# Patient Record
Sex: Female | Born: 1945 | Race: White | Hispanic: No | Marital: Married | Smoking: Former smoker
Health system: Southern US, Community
[De-identification: ages and names within clinical notes are randomized; demographics above are authoritative.]

## PROBLEM LIST (undated history)

## (undated) DIAGNOSIS — Z124 Encounter for screening for malignant neoplasm of cervix: Secondary | ICD-10-CM

## (undated) DIAGNOSIS — R195 Other fecal abnormalities: Secondary | ICD-10-CM

## (undated) DIAGNOSIS — G250 Essential tremor: Secondary | ICD-10-CM

## (undated) DIAGNOSIS — G252 Other specified forms of tremor: Secondary | ICD-10-CM

## (undated) DIAGNOSIS — R42 Dizziness and giddiness: Secondary | ICD-10-CM

## (undated) DIAGNOSIS — Z9889 Other specified postprocedural states: Secondary | ICD-10-CM

## (undated) DIAGNOSIS — M542 Cervicalgia: Secondary | ICD-10-CM

## (undated) DIAGNOSIS — H7191 Unspecified cholesteatoma, right ear: Secondary | ICD-10-CM

## (undated) DIAGNOSIS — Z86010 Personal history of colonic polyps: Secondary | ICD-10-CM

## (undated) DIAGNOSIS — K219 Gastro-esophageal reflux disease without esophagitis: Secondary | ICD-10-CM

## (undated) DIAGNOSIS — E785 Hyperlipidemia, unspecified: Secondary | ICD-10-CM

## (undated) DIAGNOSIS — I10 Essential (primary) hypertension: Secondary | ICD-10-CM

## (undated) DIAGNOSIS — G709 Myoneural disorder, unspecified: Secondary | ICD-10-CM

## (undated) DIAGNOSIS — S060X9A Concussion with loss of consciousness of unspecified duration, initial encounter: Secondary | ICD-10-CM

## (undated) DIAGNOSIS — K5790 Diverticulosis of intestine, part unspecified, without perforation or abscess without bleeding: Secondary | ICD-10-CM

## (undated) DIAGNOSIS — R109 Unspecified abdominal pain: Secondary | ICD-10-CM

## (undated) DIAGNOSIS — R079 Chest pain, unspecified: Secondary | ICD-10-CM

## (undated) DIAGNOSIS — Z87891 Personal history of nicotine dependence: Secondary | ICD-10-CM

## (undated) DIAGNOSIS — J309 Allergic rhinitis, unspecified: Secondary | ICD-10-CM

## (undated) DIAGNOSIS — T7840XA Allergy, unspecified, initial encounter: Secondary | ICD-10-CM

## (undated) DIAGNOSIS — H9191 Unspecified hearing loss, right ear: Secondary | ICD-10-CM

## (undated) DIAGNOSIS — R112 Nausea with vomiting, unspecified: Secondary | ICD-10-CM

## (undated) DIAGNOSIS — R259 Unspecified abnormal involuntary movements: Secondary | ICD-10-CM

## (undated) DIAGNOSIS — Z720 Tobacco use: Secondary | ICD-10-CM

## (undated) DIAGNOSIS — R739 Hyperglycemia, unspecified: Secondary | ICD-10-CM

## (undated) DIAGNOSIS — D125 Benign neoplasm of sigmoid colon: Secondary | ICD-10-CM

## (undated) DIAGNOSIS — Z8739 Personal history of other diseases of the musculoskeletal system and connective tissue: Secondary | ICD-10-CM

## (undated) DIAGNOSIS — K56699 Other intestinal obstruction unspecified as to partial versus complete obstruction: Secondary | ICD-10-CM

## (undated) DIAGNOSIS — E669 Obesity, unspecified: Secondary | ICD-10-CM

## (undated) DIAGNOSIS — Z0000 Encounter for general adult medical examination without abnormal findings: Secondary | ICD-10-CM

## (undated) HISTORY — DX: Dizziness and giddiness: R42

## (undated) HISTORY — DX: Allergy, unspecified, initial encounter: T78.40XA

## (undated) HISTORY — DX: Chest pain, unspecified: R07.9

## (undated) HISTORY — DX: Other intestinal obstruction unspecified as to partial versus complete obstruction: K56.699

## (undated) HISTORY — DX: Cervicalgia: M54.2

## (undated) HISTORY — DX: Essential (primary) hypertension: I10

## (undated) HISTORY — DX: Allergic rhinitis, unspecified: J30.9

## (undated) HISTORY — DX: Encounter for general adult medical examination without abnormal findings: Z00.00

## (undated) HISTORY — DX: Hyperglycemia, unspecified: R73.9

## (undated) HISTORY — DX: Unspecified abnormal involuntary movements: R25.9

## (undated) HISTORY — DX: Essential tremor: G25.0

## (undated) HISTORY — DX: Encounter for screening for malignant neoplasm of cervix: Z12.4

## (undated) HISTORY — PX: OTHER SURGICAL HISTORY: SHX169

## (undated) HISTORY — DX: Personal history of nicotine dependence: Z87.891

## (undated) HISTORY — DX: Unspecified abdominal pain: R10.9

## (undated) HISTORY — DX: Hyperlipidemia, unspecified: E78.5

## (undated) HISTORY — DX: Mixed hyperlipidemia: E78.2

## (undated) HISTORY — DX: Other fecal abnormalities: R19.5

## (undated) HISTORY — DX: Tobacco use: Z72.0

## (undated) HISTORY — DX: Obesity, unspecified: E66.9

## (undated) HISTORY — DX: Diverticulosis of intestine, part unspecified, without perforation or abscess without bleeding: K57.90

## (undated) HISTORY — DX: Unspecified cholesteatoma, right ear: H71.91

## (undated) HISTORY — DX: Benign neoplasm of sigmoid colon: D12.5

## (undated) HISTORY — DX: Other specified forms of tremor: G25.2

## (undated) HISTORY — DX: Gastro-esophageal reflux disease without esophagitis: K21.9

## (undated) HISTORY — DX: Unspecified hearing loss, right ear: H91.91

## (undated) HISTORY — DX: Personal history of colonic polyps: Z86.010

## (undated) HISTORY — DX: Personal history of other diseases of the musculoskeletal system and connective tissue: Z87.39

---

## 1967-08-24 DIAGNOSIS — S060X9A Concussion with loss of consciousness of unspecified duration, initial encounter: Secondary | ICD-10-CM | POA: Insufficient documentation

## 1967-08-24 DIAGNOSIS — S060XAA Concussion with loss of consciousness status unknown, initial encounter: Secondary | ICD-10-CM

## 1967-08-24 HISTORY — DX: Concussion with loss of consciousness status unknown, initial encounter: S06.0XAA

## 1967-08-24 HISTORY — DX: Concussion with loss of consciousness of unspecified duration, initial encounter: S06.0X9A

## 2003-10-23 ENCOUNTER — Other Ambulatory Visit: Admission: RE | Admit: 2003-10-23 | Discharge: 2003-10-23 | Payer: Self-pay | Admitting: Obstetrics and Gynecology

## 2004-06-24 ENCOUNTER — Ambulatory Visit: Payer: Self-pay | Admitting: Internal Medicine

## 2005-05-13 ENCOUNTER — Ambulatory Visit: Payer: Self-pay | Admitting: Internal Medicine

## 2005-10-06 ENCOUNTER — Other Ambulatory Visit: Admission: RE | Admit: 2005-10-06 | Discharge: 2005-10-06 | Payer: Self-pay | Admitting: Obstetrics and Gynecology

## 2007-08-29 ENCOUNTER — Other Ambulatory Visit: Admission: RE | Admit: 2007-08-29 | Discharge: 2007-08-29 | Payer: Self-pay | Admitting: Obstetrics and Gynecology

## 2008-04-23 ENCOUNTER — Ambulatory Visit: Payer: Self-pay | Admitting: Obstetrics and Gynecology

## 2008-09-19 ENCOUNTER — Ambulatory Visit: Payer: Self-pay | Admitting: Diagnostic Radiology

## 2008-09-19 ENCOUNTER — Ambulatory Visit: Payer: Self-pay | Admitting: Internal Medicine

## 2008-09-19 ENCOUNTER — Ambulatory Visit (HOSPITAL_BASED_OUTPATIENT_CLINIC_OR_DEPARTMENT_OTHER): Admission: RE | Admit: 2008-09-19 | Discharge: 2008-09-19 | Payer: Self-pay | Admitting: Internal Medicine

## 2008-09-19 DIAGNOSIS — E785 Hyperlipidemia, unspecified: Secondary | ICD-10-CM | POA: Insufficient documentation

## 2008-09-19 DIAGNOSIS — I10 Essential (primary) hypertension: Secondary | ICD-10-CM

## 2008-09-19 DIAGNOSIS — Z87891 Personal history of nicotine dependence: Secondary | ICD-10-CM

## 2008-09-19 DIAGNOSIS — H9191 Unspecified hearing loss, right ear: Secondary | ICD-10-CM | POA: Insufficient documentation

## 2008-09-19 DIAGNOSIS — J309 Allergic rhinitis, unspecified: Secondary | ICD-10-CM

## 2008-09-19 DIAGNOSIS — G250 Essential tremor: Secondary | ICD-10-CM

## 2008-09-19 DIAGNOSIS — H7191 Unspecified cholesteatoma, right ear: Secondary | ICD-10-CM

## 2008-09-19 DIAGNOSIS — R079 Chest pain, unspecified: Secondary | ICD-10-CM | POA: Insufficient documentation

## 2008-09-19 DIAGNOSIS — R0789 Other chest pain: Secondary | ICD-10-CM

## 2008-09-19 HISTORY — DX: Other chest pain: R07.89

## 2008-09-19 HISTORY — DX: Essential (primary) hypertension: I10

## 2008-09-19 HISTORY — DX: Allergic rhinitis, unspecified: J30.9

## 2008-09-19 HISTORY — DX: Unspecified cholesteatoma, right ear: H71.91

## 2008-09-19 HISTORY — DX: Personal history of nicotine dependence: Z87.891

## 2008-09-19 HISTORY — DX: Chest pain, unspecified: R07.9

## 2008-09-19 HISTORY — DX: Essential tremor: G25.0

## 2008-09-19 HISTORY — DX: Unspecified hearing loss, right ear: H91.91

## 2008-09-26 ENCOUNTER — Ambulatory Visit: Payer: Self-pay | Admitting: Internal Medicine

## 2008-09-26 LAB — CONVERTED CEMR LAB
Albumin: 3.7 g/dL (ref 3.5–5.2)
BUN: 17 mg/dL (ref 6–23)
Basophils Absolute: 0.2 10*3/uL — ABNORMAL HIGH (ref 0.0–0.1)
Basophils Relative: 3.5 % — ABNORMAL HIGH (ref 0.0–3.0)
CRP, High Sensitivity: 1 (ref 0.00–5.00)
Calcium: 9.2 mg/dL (ref 8.4–10.5)
Creatinine, Ser: 0.6 mg/dL (ref 0.4–1.2)
Direct LDL: 148.6 mg/dL
Eosinophils Absolute: 0.1 10*3/uL (ref 0.0–0.7)
Eosinophils Relative: 2.2 % (ref 0.0–5.0)
GFR calc Af Amer: 130 mL/min
GFR calc non Af Amer: 108 mL/min
HCT: 41.6 % (ref 36.0–46.0)
MCHC: 34.3 g/dL (ref 30.0–36.0)
MCV: 102.6 fL — ABNORMAL HIGH (ref 78.0–100.0)
Monocytes Absolute: 0.3 10*3/uL (ref 0.1–1.0)
Platelets: 259 10*3/uL (ref 150–400)
RBC: 4.06 M/uL (ref 3.87–5.11)
TSH: 1.22 microintl units/mL (ref 0.35–5.50)
Triglycerides: 54 mg/dL (ref 0–149)
WBC: 6.2 10*3/uL (ref 4.5–10.5)

## 2008-10-02 ENCOUNTER — Ambulatory Visit: Payer: Self-pay | Admitting: Cardiology

## 2008-10-03 ENCOUNTER — Ambulatory Visit: Payer: Self-pay | Admitting: Internal Medicine

## 2008-10-03 DIAGNOSIS — E782 Mixed hyperlipidemia: Secondary | ICD-10-CM

## 2008-10-03 HISTORY — DX: Mixed hyperlipidemia: E78.2

## 2008-10-08 ENCOUNTER — Ambulatory Visit: Payer: Self-pay

## 2008-10-08 ENCOUNTER — Encounter: Payer: Self-pay | Admitting: Internal Medicine

## 2009-01-01 ENCOUNTER — Ambulatory Visit: Payer: Self-pay | Admitting: Internal Medicine

## 2009-01-01 DIAGNOSIS — R259 Unspecified abnormal involuntary movements: Secondary | ICD-10-CM | POA: Insufficient documentation

## 2009-01-01 DIAGNOSIS — H6690 Otitis media, unspecified, unspecified ear: Secondary | ICD-10-CM | POA: Insufficient documentation

## 2009-01-01 DIAGNOSIS — J069 Acute upper respiratory infection, unspecified: Secondary | ICD-10-CM | POA: Insufficient documentation

## 2009-01-01 HISTORY — DX: Unspecified abnormal involuntary movements: R25.9

## 2009-01-03 ENCOUNTER — Telehealth (INDEPENDENT_AMBULATORY_CARE_PROVIDER_SITE_OTHER): Payer: Self-pay | Admitting: *Deleted

## 2009-01-16 ENCOUNTER — Encounter (INDEPENDENT_AMBULATORY_CARE_PROVIDER_SITE_OTHER): Payer: Self-pay | Admitting: *Deleted

## 2009-02-13 ENCOUNTER — Encounter: Payer: Self-pay | Admitting: Internal Medicine

## 2010-09-20 LAB — CONVERTED CEMR LAB: Pap Smear: NORMAL

## 2011-01-05 NOTE — Assessment & Plan Note (Signed)
[facility]                            CARDIOLOGY OFFICE NOTE   Renee [name], Renee [name]                       MRN:          [id number]  DATE:10/02/2008                            DOB:          August 21, 1946    Renee [name] is a pleasant 65 year old female, who I am asked to evaluate  for chest pain and hypertension.  She has no prior cardiac history.  She  did have a Myoview due to chest pain in October 2001 that showed no  ischemia or infarction, and ejection fraction was 65%.  She typically  has dyspnea with more extreme activities, but not with routine activity.  There is no orthopnea, PND, pedal edema, palpitations, presyncope, or  syncope.  Over the past 3 weeks, she has had occasional chest pain.  The  pain is in the substernal location.  It does not radiate.  It is  described as a pressure.  She has noticed them more with taking a deep  breath and also with bending over.  She also notices them more when  using her arms.  There is no associated nausea, vomiting, shortness of  breath, or diaphoresis.  It lasts for minutes and resolves  spontaneously.  Note, she does not have exertional chest pain.  Because  of the above, we are asked to further evaluate.  Also, of note, her  blood pressure has recently been elevated.  She was given Benicar, but  apparently this caused an elevated heart rate.  She has discontinued  this.  She is to see Dr. Artist Pais tomorrow for further management of this  issue.   PRESENT MEDICATIONS:  1. Aspirin 325 mg p.o. daily.  2. Multivitamin daily.  3. Vitamin B complex, A, D, and E.   ALLERGIES:  She has an allergy to AMOXICILLIN.   SOCIAL HISTORY:  She does smoke.  She consumes 2 alcoholic beverages per  day.  She is married with 2 children and 2 grandchildren.   FAMILY HISTORY:  Positive for coronary artery disease, as her father  died of a myocardial infarction at age 71.   PAST MEDICAL HISTORY:  Significant for recently  diagnosed hypertension.  She denies any hyperlipidemia or diabetes.  She also has been told that  she has had COPD recently.  She has a history of a benign essential  tremor.  She also has a history of allergic rhinitis.   REVIEW OF SYSTEMS:  She denies any headaches, fevers, or chills.  There  is no productive cough or hemoptysis.  There is no dysphagia,  odynophagia, melena, or hematochezia.  There is no dysuria or hematuria.  There is no rash or seizure activity.  There is no orthopnea, PND, or  pedal edema.  The remaining systems are negative.   PHYSICAL EXAMINATION:  VITAL SIGNS:  Today, shows a blood pressure of  138/90 and her pulse is 88.  She weighs 166 pounds.  GENERAL:  She is a well developed, well nourished in no acute distress.  SKIN:  Warm and dry.  She does not appear to be depressed.  There is  no  peripheral clubbing.  BACK:  Normal.  HEENT:  Normal with normal eyelids.  NECK:  Supple with normal upstroke bilaterally.  No bruits heard.  There  is no jugular venous distention and I cannot appreciate thyromegaly.  CHEST:  Clear to auscultation.  No expansion.  CARDIOVASCULAR:  Regular rhythm.  Normal S1 and S2.  There are no  murmurs, rubs, or gallops noted.  ABDOMEN:  Nontender and nondistended.  Positive bowel sounds.  No  hepatosplenomegaly.  No mass appreciated.  There is no abdominal bruit.  She has 2+ femoral pulses bilaterally.  No Bruits.  EXTREMITIES:  No edema.  I cannot palpate no cords.  She has 2+ dorsalis  pedis pulses bilaterally.  NEUROLOGIC:  Grossly intact.   I do have an electrocardiogram dated on September 19, 2008.  This shows  sinus rhythm with minor nonspecific ST changes.   DIAGNOSES:  1. Atypical chest pain - the patient's symptoms are atypical.      However, she does have some dyspnea on exertion and has multiple      risk factors.  We will plan to proceed with a stress Myoview.  If      shows no ischemia, then we will not pursue further  cardiac workup.      2.  Tobacco abuse - we discussed the importance of discontinuing      this for between 3-10 minutes.  2. Hypertension - her blood pressure is mildly elevated today.  She is      to see Dr. Artist Pais tomorrow and an additional medication might be added      at that time.  Note, she did not tolerate the BENICAR.  We      discussed lifestyle modification including low-sodium diet, weight      loss, exercise, moderate ethyl alcohol intake, and discontinuing      her tobacco use.  I will leave this issue to Dr. Artist Pais.  3. Benign essential tremor.   We will see Renee [name] back on as-needed basis pending the results of her  Myoview.     Madolyn Frieze Jens Som, MD, [facility]  Electronically Signed    BSC/MedQ  DD: 10/02/2008  DT: 10/03/2008  Job #: [id number]   cc:   Barbette Hair. Artist Pais, DO

## 2011-01-22 HISTORY — PX: MOUTH SURGERY: SHX715

## 2011-11-04 ENCOUNTER — Ambulatory Visit: Payer: Self-pay | Admitting: Internal Medicine

## 2011-11-23 ENCOUNTER — Encounter: Payer: Self-pay | Admitting: Internal Medicine

## 2011-11-23 ENCOUNTER — Ambulatory Visit (INDEPENDENT_AMBULATORY_CARE_PROVIDER_SITE_OTHER): Payer: Medicare Other | Admitting: Internal Medicine

## 2011-11-23 ENCOUNTER — Telehealth: Payer: Self-pay | Admitting: Internal Medicine

## 2011-11-23 VITALS — BP 132/80 | HR 99 | Temp 98.1°F | Resp 18 | Ht 66.0 in | Wt 165.0 lb

## 2011-11-23 DIAGNOSIS — J40 Bronchitis, not specified as acute or chronic: Secondary | ICD-10-CM

## 2011-11-23 MED ORDER — DOXYCYCLINE HYCLATE 100 MG PO TABS: 100.0000 mg | ORAL_TABLET | Freq: Two times a day (BID) | ORAL | Status: AC

## 2011-11-23 MED ORDER — DOXYCYCLINE HYCLATE 100 MG PO TABS: 100.0000 mg | ORAL_TABLET | Freq: Two times a day (BID) | ORAL | Status: DC

## 2011-11-23 NOTE — Telephone Encounter (Signed)
Patient states that she gave Agustin Cree the wrong pharmacy info.  New pharmacy info  [facility] on [address]

## 2011-11-23 NOTE — Progress Notes (Signed)
  Subjective:    Patient ID: Renee [name], female    DOB: 12-22-1945, 66 y.o.   MRN: [id number]  HPI Pt presents to clinic for evaluation of cough. Notes ~12 h/o intermittently productive cough previously productive for green sputum. Has associated nasal congestion and drainage. Denies f/c or hemoptysis. No alleviating or exacerbating factors. Does smoke tobacco however has weaned down to ~2 cigarettes daily with cessation plans. No taking medication for cessation. No other complaints.  Past Medical History  Diagnosis Date  . Hyperlipidemia   . Tobacco abuse   . Tremor, essential   . Hypertension   . Allergic rhinitis    Past Surgical History  Procedure Date  . No past surgeries 11/23/2011    denies surgical history    reports that she has been smoking.  She has never used smokeless tobacco. She reports that she drinks alcohol. She reports that she does not use illicit drugs. family history includes Heart attack (age of onset:58) in her father; Hypertension in her mother; and Stroke in her mother.  There is no history of Colon cancer, and Breast cancer, and Prostate cancer, and Diabetes, . Allergies  Allergen Reactions  . Amoxicillin     REACTION: TROUBLE BREATHING     Review of Systems  Constitutional: Negative for fever and chills.  HENT: Positive for congestion and rhinorrhea.   Respiratory: Positive for cough. Negative for shortness of breath.   Cardiovascular: Negative for chest pain.  All other systems reviewed and are negative.       Objective:   Physical Exam  Nursing note and vitals reviewed. Constitutional: She appears well-developed and well-nourished. No distress.  HENT:  Head: Normocephalic and atraumatic.  Right Ear: External ear normal.  Left Ear: External ear normal.  Nose: Nose normal.  Mouth/Throat: Oropharynx is clear and moist. No oropharyngeal exudate.  Eyes: Conjunctivae are normal. No scleral icterus.  Neck: Neck supple.  Cardiovascular: Normal  rate, regular rhythm and normal heart sounds.  Exam reveals no gallop and no friction rub.   No murmur heard. Pulmonary/Chest: Effort normal and breath sounds normal. No respiratory distress. She has no wheezes. She has no rales.  Lymphadenopathy:    She has no cervical adenopathy.  Neurological: She is alert.       Head tremor noted  Skin: Skin is warm and dry. She is not diaphoretic.  Psychiatric: She has a normal mood and affect.          Assessment & Plan:

## 2011-11-23 NOTE — Assessment & Plan Note (Signed)
Begin abx course. Followup if no improvement or worsening. 

## 2011-11-23 NOTE — Telephone Encounter (Signed)
Pharmacy updated, Rx resent to [facility] on [address].

## 2011-12-09 ENCOUNTER — Ambulatory Visit: Payer: Self-pay | Admitting: Internal Medicine

## 2012-02-23 ENCOUNTER — Encounter: Payer: Self-pay | Admitting: Family

## 2012-02-23 ENCOUNTER — Ambulatory Visit (INDEPENDENT_AMBULATORY_CARE_PROVIDER_SITE_OTHER): Payer: Medicare Other | Admitting: Family

## 2012-02-23 VITALS — BP 130/92 | HR 96 | Temp 98.2°F | Resp 16 | Wt 167.0 lb

## 2012-02-23 DIAGNOSIS — H6690 Otitis media, unspecified, unspecified ear: Secondary | ICD-10-CM

## 2012-02-23 DIAGNOSIS — J029 Acute pharyngitis, unspecified: Secondary | ICD-10-CM

## 2012-02-23 LAB — POCT RAPID STREP A (OFFICE): Rapid Strep A Screen: NEGATIVE

## 2012-02-23 MED ORDER — AZITHROMYCIN 250 MG PO TABS: ORAL_TABLET | ORAL | Status: AC

## 2012-02-23 NOTE — Patient Instructions (Addendum)
Otitis Media, Adult  A middle ear infection is an infection in the space behind the eardrum. The medical name for this is "otitis media." It may happen after a common cold. It is caused by a germ that starts growing in that space. You may feel swollen glands in your neck on the side of the ear infection.  HOME CARE INSTRUCTIONS   · Take your medicine as directed until it is gone, even if you feel better after the first few days.  · Only take over-the-counter or prescription medicines for pain, discomfort, or fever as directed by your caregiver.  · Occasional use of a nasal decongestant a couple times per day may help with discomfort and help the eustachian tube to drain better.  Follow up with your caregiver in 10 to 14 days or as directed, to be certain that the infection has cleared. Not keeping the appointment could result in a chronic or permanent injury, pain, hearing loss and disability. If there is any problem keeping the appointment, you must call back to this facility for assistance.  SEEK IMMEDIATE MEDICAL CARE IF:   · You are not getting better in 2 to 3 days.  · You have pain that is not controlled with medication.  · You feel worse instead of better.  · You cannot use the medication as directed.  · You develop swelling, redness or pain around the ear or stiffness in your neck.  MAKE SURE YOU:   · Understand these instructions.  · Will watch your condition.  · Will get help right away if you are not doing well or get worse.  Document Released: 05/14/2004 Document Revised: 07/29/2011 Document Reviewed: 03/15/2008  Exit[employer]® Patient Information ©2012 [employer].

## 2012-02-23 NOTE — Progress Notes (Signed)
  Subjective:    Patient ID: Renee [name], female    DOB: 08/06/1946, 66 y.o.   MRN: [id number]  HPI  [name] is a 66 yr old female who presents today with chief complaint of right ear pain.  She reports that ear pain started 4-5 days ago. She has tried aspirin, heating pad, and echinacea with some improvement.  She reports some right sided throat pain.  She denies associated fever.     Review of Systems See HPI  Past Medical History  Diagnosis Date  . Hyperlipidemia   . Tobacco abuse   . Tremor, essential   . Hypertension   . Allergic rhinitis     History   Social History  . Marital Status: Married    Spouse Name: N/A    Number of Children: N/A  . Years of Education: N/A   Occupational History  . Not on file.   Social History Main Topics  . Smoking status: Current Everyday Smoker    Types: Cigarettes  . Smokeless tobacco: Never Used   Comment: 5 cigarettes daily  . Alcohol Use: Yes  . Drug Use: No  . Sexually Active: Not on file   Other Topics Concern  . Not on file   Social History Narrative  . No narrative on file    Past Surgical History  Procedure Date  . Mouth surgery 01/22/11    lower denture secured by titanium bolts    Family History  Problem Relation Age of Onset  . Colon cancer Neg Hx   . Breast cancer Neg Hx   . Prostate cancer Neg Hx   . Heart attack Father 64    deceased  . Stroke Mother   . Hypertension Mother   . Diabetes Neg Hx     Allergies  Allergen Reactions  . Amoxicillin     REACTION: TROUBLE BREATHING    No current outpatient prescriptions on file prior to visit.    BP 130/92  Pulse 96  Temp 98.2 F (36.8 C) (Oral)  Resp 16  Wt 167 lb (75.751 kg)  SpO2 97%       Objective:   Physical Exam  Constitutional: She appears well-developed and well-nourished. No distress.  HENT:  Head: Normocephalic and atraumatic.  Right Ear: Tympanic membrane is retracted. Tympanic membrane is not perforated and not  erythematous.  Left Ear: Tympanic membrane is erythematous. Tympanic membrane is not retracted and not bulging.  Mouth/Throat: Posterior oropharyngeal erythema present. No oropharyngeal exudate, posterior oropharyngeal edema or tonsillar abscesses.  Cardiovascular: Normal rate and regular rhythm.   Pulmonary/Chest: Effort normal and breath sounds normal. No respiratory distress. She has no wheezes. She has no rales. She exhibits no tenderness.  Abdominal: Soft. Bowel sounds are normal. She exhibits no distension and no mass. There is no tenderness. There is no rebound.  Lymphadenopathy:    She has no cervical adenopathy.  Neurological:       Resting head tremor.   Psychiatric: She has a normal mood and affect. Her speech is normal and behavior is normal. Judgment and thought content normal. Cognition and memory are normal.          Assessment & Plan:

## 2012-02-23 NOTE — Assessment & Plan Note (Signed)
Will plan to treat with azithromycin due to hx of sob with amoxicillin. Rapid strep is performed today and is negative.  Recommended motrin prn pain.

## 2012-02-25 NOTE — Addendum Note (Signed)
Addended by: Mervin Kung A on: 02/25/2012 01:39 PM   Modules accepted: Orders

## 2013-02-19 ENCOUNTER — Telehealth: Payer: Self-pay | Admitting: Internal Medicine

## 2013-02-19 ENCOUNTER — Encounter: Payer: Self-pay | Admitting: Family Medicine

## 2013-02-19 ENCOUNTER — Ambulatory Visit (INDEPENDENT_AMBULATORY_CARE_PROVIDER_SITE_OTHER): Payer: Medicare Other | Admitting: Family Medicine

## 2013-02-19 VITALS — BP 126/88 | HR 86 | Temp 98.7°F | Ht 66.75 in | Wt 167.0 lb

## 2013-02-19 DIAGNOSIS — I10 Essential (primary) hypertension: Secondary | ICD-10-CM

## 2013-02-19 DIAGNOSIS — F17200 Nicotine dependence, unspecified, uncomplicated: Secondary | ICD-10-CM

## 2013-02-19 DIAGNOSIS — J309 Allergic rhinitis, unspecified: Secondary | ICD-10-CM

## 2013-02-19 DIAGNOSIS — E785 Hyperlipidemia, unspecified: Secondary | ICD-10-CM

## 2013-02-19 DIAGNOSIS — H9191 Unspecified hearing loss, right ear: Secondary | ICD-10-CM

## 2013-02-19 DIAGNOSIS — H60399 Other infective otitis externa, unspecified ear: Secondary | ICD-10-CM

## 2013-02-19 DIAGNOSIS — H60391 Other infective otitis externa, right ear: Secondary | ICD-10-CM

## 2013-02-19 DIAGNOSIS — H9190 Unspecified hearing loss, unspecified ear: Secondary | ICD-10-CM

## 2013-02-19 MED ORDER — HYDROCORTISONE-ACETIC ACID 1-2 % OT SOLN: 4.0000 [drp] | Freq: Three times a day (TID) | OTIC | Status: DC

## 2013-02-19 NOTE — Patient Instructions (Addendum)
Next visit annual GYN with labs prior to visit, lipid, renal, cbc, tsh, hepatic  Otitis Externa Otitis externa is a bacterial or fungal infection of the outer ear canal. This is the area from the eardrum to the outside of the ear. Otitis externa is sometimes called "swimmer's ear." CAUSES  Possible causes of infection include:  Swimming in dirty water.  Moisture remaining in the ear after swimming or bathing.  Mild injury (trauma) to the ear.  Objects stuck in the ear (foreign body).  Cuts or scrapes (abrasions) on the outside of the ear. SYMPTOMS  The first symptom of infection is often itching in the ear canal. Later signs and symptoms may include swelling and redness of the ear canal, ear pain, and yellowish-white fluid (pus) coming from the ear. The ear pain may be worse when pulling on the earlobe. DIAGNOSIS  Your caregiver will perform a physical exam. A sample of fluid may be taken from the ear and examined for bacteria or fungi. TREATMENT  Antibiotic ear drops are often given for 10 to 14 days. Treatment may also include pain medicine or corticosteroids to reduce itching and swelling. PREVENTION   Keep your ear dry. Use the corner of a towel to absorb water out of the ear canal after swimming or bathing.  Avoid scratching or putting objects inside your ear. This can damage the ear canal or remove the protective wax that lines the canal. This makes it easier for bacteria and fungi to grow.  Avoid swimming in lakes, polluted water, or poorly chlorinated pools.  You may use ear drops made of rubbing alcohol and vinegar after swimming. Combine equal parts of white vinegar and alcohol in a bottle. Put 3 or 4 drops into each ear after swimming. HOME CARE INSTRUCTIONS   Apply antibiotic ear drops to the ear canal as prescribed by your caregiver.  Only take over-the-counter or prescription medicines for pain, discomfort, or fever as directed by your caregiver.  If you have  diabetes, follow any additional treatment instructions from your caregiver.  Keep all follow-up appointments as directed by your caregiver. SEEK MEDICAL CARE IF:   You have a fever.  Your ear is still red, swollen, painful, or draining pus after 3 days.  Your redness, swelling, or pain gets worse.  You have a severe headache.  You have redness, swelling, pain, or tenderness in the area behind your ear. MAKE SURE YOU:   Understand these instructions.  Will watch your condition.  Will get help right away if you are not doing well or get worse. Document Released: 08/09/2005 Document Revised: 11/01/2011 Document Reviewed: 08/26/2011 [facility] Patient Information 2014 [place], [place].

## 2013-02-19 NOTE — Telephone Encounter (Signed)
Next visit annual GYN with labs prior to visit, lipid, renal, cbc, tsh, hepatic   Patient has appointment in September/2014 and will be going to [place] [facility]

## 2013-02-20 ENCOUNTER — Encounter: Payer: Self-pay | Admitting: Family Medicine

## 2013-02-20 NOTE — Assessment & Plan Note (Signed)
Encouraged complete cessation. 

## 2013-02-20 NOTE — Assessment & Plan Note (Signed)
Persistent/recurrent Otitis externa and/or SOM. Started on Vosol HC drops and referred to ENT for further consideration

## 2013-02-20 NOTE — Assessment & Plan Note (Signed)
Consider Zyrtec daily

## 2013-02-20 NOTE — Assessment & Plan Note (Signed)
Well controlled, no change in medications

## 2013-02-20 NOTE — Progress Notes (Signed)
Patient ID: Renee [name], female   DOB: 14-Sep-1945, 67 y.o.   MRN: [id number] Renee [name] [id number] 05-06-1946 02/20/2013      Progress Note-Follow Up  Subjective  Chief Complaint  Chief Complaint  Patient presents with  . Follow-up    HPI  Patient is a 67 year old Caucasian female who is in today to discuss ear pain and pressure. So long history of chronic head tremor but this is not worsening. She is complaining of symptoms worsen her right ear than her left ear. She complains of some popping and decreased hearing in that right ear fairly consistently at this point. No significant headache. No fevers or chills. Minimal nasal congestion. No chest pain, palpitations, shortness of breath, GI or GU complaints are noted.   Past Medical History  Diagnosis Date  . Hyperlipidemia   . Tobacco abuse   . Tremor, essential   . Hypertension   . Allergic rhinitis   . 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 Mouth surgery  01/22/11    lower denture secured by titanium bolts    Family History  Problem Relation Age of Onset  . Colon cancer Neg Hx   . Breast cancer Neg Hx   . Prostate cancer Neg Hx   . Heart attack Father 2    deceased  . Stroke Mother   . Hypertension Mother   . Diabetes Neg Hx     History   Social History  . Marital Status: Married    Spouse Name: N/A    Number of Children: N/A  . Years of Education: N/A   Occupational History  . Not on file.   Social History Main Topics  . Smoking status: Current Every Day Smoker    Types: Cigarettes  . Smokeless tobacco: Never Used     Comment: 5 cigarettes daily  . Alcohol Use: Yes  . Drug Use: No  . Sexually Active: Not on file   Other Topics Concern  . Not on file   Social History Narrative  . No narrative on file    No current outpatient prescriptions on file prior to visit.   No current  facility-administered medications on file prior to visit.    Allergies  Allergen Reactions  . Amoxicillin     REACTION: TROUBLE BREATHING    Review of Systems  Review of Systems  Constitutional: Negative for fever, chills and malaise/fatigue.  HENT: Positive for ear pain. Negative for hearing loss, nosebleeds, congestion and ear discharge.   Eyes: Negative for discharge.  Respiratory: Negative for cough, sputum production, shortness of breath and wheezing.   Cardiovascular: Negative for chest pain, palpitations and leg swelling.  Gastrointestinal: Negative for heartburn, nausea, vomiting, abdominal pain, diarrhea, constipation and blood in stool.  Genitourinary: Negative for dysuria, urgency, frequency and hematuria.  Musculoskeletal: Negative for myalgias, back pain and falls.  Skin: Negative for rash.  Neurological: Negative for dizziness, tremors, sensory change, focal weakness, loss of consciousness, weakness and headaches.  Endo/Heme/Allergies: Negative for polydipsia. Does not bruise/bleed easily.  Psychiatric/Behavioral: Negative for depression and suicidal ideas. The patient is not nervous/anxious and does not have insomnia.     Objective  BP 126/88  Pulse 86  Temp(Src) 98.7 F (37.1 C) (Oral)  Ht 5' 6.75" (1.695 m)  Wt 167 lb (75.751 kg)  BMI 26.37 kg/m2  SpO2 97%  Physical Exam  Physical Exam  Constitutional: She is oriented to person, place, and time and well-developed, well-nourished, and in no distress. No distress.  HENT:  Head: Normocephalic and atraumatic.  Left Ear: External ear normal.  TM dull and retracted b/l  Eyes: Conjunctivae are normal.  Neck: Neck supple. No thyromegaly present.  Cardiovascular: Normal rate, regular rhythm and normal heart sounds.   No murmur heard. Pulmonary/Chest: Effort normal and breath sounds normal. She has no wheezes.  Abdominal: She exhibits no distension and no mass.  Musculoskeletal: She exhibits no edema.   Lymphadenopathy:    She has no cervical adenopathy.  Neurological: She is alert and oriented to person, place, and time.  Skin: Skin is warm and dry. No rash noted. She is not diaphoretic.  Psychiatric: Memory, affect and judgment normal.   Lab Results  Component Value Date   TSH 1.22 09/26/2008   Lab Results  Component Value Date   WBC 6.2 09/26/2008   HGB 14.3 09/26/2008   HCT 41.6 09/26/2008   MCV 102.6* 09/26/2008   PLT 259 09/26/2008   Lab Results  Component Value Date   CREATININE 0.6 09/26/2008   BUN 17 09/26/2008   NA 142 09/26/2008   K 5.1 09/26/2008   CL 110 09/26/2008   CO2 0* 09/26/2008   Lab Results  Component Value Date   ALT 22 09/26/2008   AST 20 09/26/2008   ALKPHOS 80 09/26/2008   BILITOT 0.6 09/26/2008   Lab Results  Component Value Date   CHOL 234* 09/26/2008   Lab Results  Component Value Date   HDL 73.0 09/26/2008   No results found for this basename: [facility]   Lab Results  Component Value Date   TRIG 54 09/26/2008   Lab Results  Component Value Date   CHOLHDL 3.2 CALC 09/26/2008     Assessment & Plan  TOBACCO ABUSE Encouraged complete cessation  Hearing loss in right ear Persistent/recurrent Otitis externa and/or SOM. Started on Vosol HC drops and referred to ENT for further consideration  HYPERTENSION Well controlled, no change in medications  ALLERGIC RHINITIS Consider Zyrtec daily

## 2013-02-28 ENCOUNTER — Other Ambulatory Visit: Payer: Self-pay | Admitting: Physician Assistant

## 2013-02-28 DIAGNOSIS — H7190 Unspecified cholesteatoma, unspecified ear: Secondary | ICD-10-CM

## 2013-02-28 DIAGNOSIS — H6980 Other specified disorders of Eustachian tube, unspecified ear: Secondary | ICD-10-CM

## 2013-03-05 ENCOUNTER — Ambulatory Visit
Admission: RE | Admit: 2013-03-05 | Discharge: 2013-03-05 | Disposition: A | Discharge status: Home / Self Care | Payer: Medicare Other | Source: Ambulatory Visit | Attending: Physician Assistant | Admitting: Physician Assistant

## 2013-03-05 DIAGNOSIS — H6980 Other specified disorders of Eustachian tube, unspecified ear: Secondary | ICD-10-CM

## 2013-03-05 DIAGNOSIS — H7190 Unspecified cholesteatoma, unspecified ear: Secondary | ICD-10-CM

## 2013-05-01 ENCOUNTER — Telehealth: Payer: Self-pay | Admitting: Internal Medicine

## 2013-05-01 LAB — LIPID PANEL: Total CHOL/HDL Ratio: 3.7 Ratio

## 2013-05-01 LAB — HEPATIC FUNCTION PANEL
Alkaline Phosphatase: 81 U/L (ref 39–117)
Indirect Bilirubin: 0.7 mg/dL (ref 0.0–0.9)
Total Protein: 6.8 g/dL (ref 6.0–8.3)

## 2013-05-01 LAB — RENAL FUNCTION PANEL
CO2: 25 mEq/L (ref 19–32)
Calcium: 9.5 mg/dL (ref 8.4–10.5)
Chloride: 102 mEq/L (ref 96–112)
Glucose, Bld: 106 mg/dL — ABNORMAL HIGH (ref 70–99)
Potassium: 4.6 mEq/L (ref 3.5–5.3)
Sodium: 136 mEq/L (ref 135–145)

## 2013-05-01 LAB — CBC
Hemoglobin: 15.8 g/dL — ABNORMAL HIGH (ref 12.0–15.0)
MCH: 34.4 pg — ABNORMAL HIGH (ref 26.0–34.0)
RBC: 4.59 MIL/uL (ref 3.87–5.11)

## 2013-05-01 NOTE — Telephone Encounter (Signed)
We sent the patient to [facility]NT for loss of hearing.  She saw Dr Jearld Fenton at [facility].  He diagnosed her with cholesteatena.  Said she is to have a CT to look at this further and then probably surgery.  She has been trying to reach someone to get the CT set up for 3 weeks.  She cannot get to a human and cannot get a call back.  Any suggestions what she should do.

## 2013-05-01 NOTE — Telephone Encounter (Signed)
Please call ENT and ask MD's nurse what they want [place] to do, wil they order the test they want at [facility] or no? Patient's head tremor precluded the study they wanted at [facility]

## 2013-05-01 NOTE — Telephone Encounter (Signed)
Please advise 

## 2013-05-01 NOTE — Telephone Encounter (Signed)
So I reviewed chart it looks like they already ordered the CT at [facility] imaging, call them and see what if anything they need to schedule the CT that has been ordered. I do not think anything else has to happen except for patient to call imaging and arrange a time since they already have an order.

## 2013-05-01 NOTE — Telephone Encounter (Signed)
Pt states [facility] tried to do the CT already and had to stop due to head tremors. Pt states [facility] told patient she would need to go to the hospital to be sedated or have her head "belted" down? Please advise?

## 2013-05-02 ENCOUNTER — Other Ambulatory Visit (HOSPITAL_COMMUNITY): Payer: Self-pay | Admitting: Otolaryngology

## 2013-05-02 DIAGNOSIS — H7191 Unspecified cholesteatoma, right ear: Secondary | ICD-10-CM

## 2013-05-02 LAB — TSH: TSH: 1.554 u[IU]/mL (ref 0.350–4.500)

## 2013-05-02 NOTE — Telephone Encounter (Signed)
FYI:   The nurse called back and stated that Dr Jearld Fenton has ordered a sedated CT scan but pt doesn't want this until November.  The nurse stated pt was going to be away the whole month of October. The nurse also stated patient has turned down a couple of dates previously.  No appt has been scheduled yet but nurse is going to work on a date for November

## 2013-05-02 NOTE — Telephone Encounter (Signed)
Left a detailed message on MD's nurses vm to return my call

## 2013-05-08 ENCOUNTER — Other Ambulatory Visit (HOSPITAL_COMMUNITY)
Admission: RE | Admit: 2013-05-08 | Discharge: 2013-05-08 | Disposition: A | Discharge status: Home / Self Care | Payer: Medicare Other | Source: Ambulatory Visit | Attending: Family Medicine | Admitting: Family Medicine

## 2013-05-08 ENCOUNTER — Other Ambulatory Visit: Payer: Self-pay | Admitting: Family Medicine

## 2013-05-08 ENCOUNTER — Encounter: Payer: Self-pay | Admitting: Family Medicine

## 2013-05-08 ENCOUNTER — Ambulatory Visit (INDEPENDENT_AMBULATORY_CARE_PROVIDER_SITE_OTHER): Payer: Medicare Other | Admitting: Family Medicine

## 2013-05-08 VITALS — BP 138/82 | HR 84 | Temp 98.7°F | Ht 66.0 in | Wt 166.1 lb

## 2013-05-08 DIAGNOSIS — R739 Hyperglycemia, unspecified: Secondary | ICD-10-CM

## 2013-05-08 DIAGNOSIS — Z0000 Encounter for general adult medical examination without abnormal findings: Secondary | ICD-10-CM

## 2013-05-08 DIAGNOSIS — H7191 Unspecified cholesteatoma, right ear: Secondary | ICD-10-CM

## 2013-05-08 DIAGNOSIS — D582 Other hemoglobinopathies: Secondary | ICD-10-CM

## 2013-05-08 DIAGNOSIS — R7309 Other abnormal glucose: Secondary | ICD-10-CM

## 2013-05-08 DIAGNOSIS — Z124 Encounter for screening for malignant neoplasm of cervix: Secondary | ICD-10-CM | POA: Insufficient documentation

## 2013-05-08 DIAGNOSIS — R32 Unspecified urinary incontinence: Secondary | ICD-10-CM

## 2013-05-08 DIAGNOSIS — F17200 Nicotine dependence, unspecified, uncomplicated: Secondary | ICD-10-CM

## 2013-05-08 DIAGNOSIS — I10 Essential (primary) hypertension: Secondary | ICD-10-CM

## 2013-05-08 DIAGNOSIS — H7190 Unspecified cholesteatoma, unspecified ear: Secondary | ICD-10-CM

## 2013-05-08 DIAGNOSIS — E785 Hyperlipidemia, unspecified: Secondary | ICD-10-CM

## 2013-05-08 HISTORY — DX: Encounter for screening for malignant neoplasm of cervix: Z12.4

## 2013-05-08 LAB — CBC
HCT: 47.9 % — ABNORMAL HIGH (ref 36.0–46.0)
Hemoglobin: 16.1 g/dL — ABNORMAL HIGH (ref 12.0–15.0)
MCH: 34.1 pg — ABNORMAL HIGH (ref 26.0–34.0)
MCHC: 33.6 g/dL (ref 30.0–36.0)

## 2013-05-08 LAB — HEMOGLOBIN A1C: Hgb A1c MFr Bld: 5.6 % (ref <5.7)

## 2013-05-08 MED ORDER — ATORVASTATIN CALCIUM 10 MG PO TABS: 10.0000 mg | ORAL_TABLET | Freq: Every day | ORAL | Status: DC

## 2013-05-08 NOTE — Progress Notes (Signed)
Patient ID: Renee [name], female   DOB: 1946-01-02, 67 y.o.   MRN: [id number] JERICHO CIESLIK [id number] [birth year] 05/08/2013      Progress Note New Patient  Subjective  Chief Complaint  Chief Complaint  Patient presents with  . Annual Exam    physical  . Gynecologic Exam    pap    HPI  Patient is 67 year old Caucasian female in today for routine medical care. She is following closely with ENT for cholesteatoma and is going to proceed with surgery. She continues to struggle with some intermittent ear pain and hearing loss. Unfortunately she continues to smoke 5 cigarettes daily. No recent illness. No fevers or chills. Chest pain, palpitations, shortness of breath, GI or GU concerns. Is doing well in her home. No falls or depression. Her ADLs are being performed without difficulty.   Physical Exam  Constitutional: She is oriented to person, place, and time and well-developed, well-nourished, and in no distress. No distress.  HENT:  Head: Normocephalic and atraumatic.  Right Ear: External ear normal.  Left Ear: External ear normal.  Nose: Nose normal.  Mouth/Throat: Oropharynx is clear and moist. No oropharyngeal exudate.  right TM mildly erythematous, dull, retracted. External canal unremarkable. Left TM dull, canal unremarkable  Eyes: Conjunctivae are normal. Pupils are equal, round, and reactive to light. Right eye exhibits no discharge. Left eye exhibits no discharge. No scleral icterus.  Neck: Normal range of motion. Neck supple. No thyromegaly present.  Cardiovascular: Normal rate, regular rhythm, normal heart sounds and intact distal pulses.   No murmur heard. Pulmonary/Chest: Effort normal and breath sounds normal. No respiratory distress. She has no wheezes. She has no rales.  Abdominal: Soft. Bowel sounds are normal. She exhibits no distension and no mass. There is no tenderness.  Genitourinary: Vagina normal, uterus normal, cervix normal, right adnexa normal and left  adnexa normal. No vaginal discharge found.  Breast exam without lesion, mass, skin change or discharge b/l  Musculoskeletal: Normal range of motion. She exhibits no edema and no tenderness.  Lymphadenopathy:    She has no cervical adenopathy.  Neurological: She is alert and oriented to person, place, and time. She has normal reflexes. No cranial nerve deficit. Coordination normal.  Skin: Skin is warm and dry. No rash noted. She is not diaphoretic.  Psychiatric: Mood, memory and affect normal.    Past Medical History  Diagnosis Date  . Hyperlipidemia   . Tobacco abuse   . Tremor, essential   . Hypertension   . Allergic rhinitis   . 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 Mouth surgery  01/22/11    lower denture secured by titanium bolts    Family History  Problem Relation Age of Onset  . Colon cancer Neg Hx   . Breast cancer Neg Hx   . Prostate cancer Neg Hx   . Diabetes Neg Hx   . Heart attack Father 62    deceased  . Stroke Mother   . Hypertension Mother   . Alcohol abuse Mother   . Obesity Daughter   . Heart disease Paternal Grandmother     AAA rupture  . Heart disease Paternal Grandfather     MI    History   Social History  . Marital Status: Married    Spouse Name: N/A    Number of Children: N/A  .  Years of Education: N/A   Occupational History  . Not on file.   Social History Main Topics  . Smoking status: Current Every Day Smoker    Types: Cigarettes  . Smokeless tobacco: Never Used     Comment: 5 cigarettes daily  . Alcohol Use: Yes  . Drug Use: No  . Sexual Activity: Not on file   Other Topics Concern  . Not on file   Social History Narrative  . No narrative on file    No current outpatient prescriptions on file prior to visit.   No current facility-administered medications on file prior to visit.    Allergies  Allergen Reactions  .  Amoxicillin     REACTION: TROUBLE BREATHING    Review of Systems  Review of Systems  Constitutional: Negative for fever, chills and malaise/fatigue.  HENT: Positive for hearing loss, ear pain and ear discharge. Negative for nosebleeds and congestion.   Eyes: Negative for discharge.  Respiratory: Negative for cough, sputum production, shortness of breath and wheezing.   Cardiovascular: Negative for chest pain, palpitations and leg swelling.  Gastrointestinal: Negative for heartburn, nausea, vomiting, abdominal pain, diarrhea, constipation and blood in stool.  Genitourinary: Negative for dysuria, urgency, frequency and hematuria.  Musculoskeletal: Negative for myalgias, back pain and falls.  Skin: Negative for rash.  Neurological: Negative for dizziness, tremors, sensory change, focal weakness, loss of consciousness, weakness and headaches.  Endo/Heme/Allergies: Negative for polydipsia. Does not bruise/bleed easily.  Psychiatric/Behavioral: Negative for depression and suicidal ideas. The patient is not nervous/anxious and does not have insomnia.     Objective  BP 138/82  Pulse 84  Temp(Src) 98.7 F (37.1 C) (Oral)  Ht 5\' 6"  (1.676 m)  Wt 166 lb 1.3 oz (75.333 kg)  BMI 26.82 kg/m2  Physical Exam  See above     Assessment & Plan  Cervical cancer screening Pap today, no concerns on exam.   HYPERTENSION Well controlled, no changes  Cholesteatoma of right ear Is following with Dr Jearld Fenton, is preparing for surgery  Preventative health care Encouraged [facility] but patient declined despite risk. Declines colonoscopy. No advanced directives, doing well with ADLs, no falls or depression.  TOBACCO ABUSE Encouraged complete cessation once again  HYPERLIPIDEMIA Tolerating Atorvasatin, avoid trans fats, add krill oil caps, increase exercise  rob

## 2013-05-08 NOTE — Patient Instructions (Addendum)
Start MegaRed krill oil caps daily Avoid trans fats/partially hydrogenated oils Start Co Q 10 enzyme when starting the Atorvastatin  kegel exercises sets of 10 twice a day  Cholesterol Cholesterol is a white, waxy, fat-like protein needed by your body in small amounts. The liver makes all the cholesterol you need. It is carried from the liver by the blood through the blood vessels. Deposits (plaque) may build up on blood vessel walls. This makes the arteries narrower and stiffer. Plaque increases the risk for heart attack and stroke. You cannot feel your cholesterol level even if it is very high. The only way to know is by a blood test to check your lipid (fats) levels. Once you know your cholesterol levels, you should keep a record of the test results. Work with your caregiver to to keep your levels in the desired range. WHAT THE RESULTS MEAN:  Total cholesterol is a rough measure of all the cholesterol in your blood.  LDL is the so-called bad cholesterol. This is the type that deposits cholesterol in the walls of the arteries. You want this level to be low.  HDL is the good cholesterol because it cleans the arteries and carries the LDL away. You want this level to be high.  Triglycerides are fat that the body can either burn for energy or store. High levels are closely linked to heart disease. DESIRED LEVELS:  Total cholesterol below 200.  LDL below 100 for people at risk, below 70 for very high risk.  HDL above 50 is good, above 60 is best.  Triglycerides below 150. HOW TO LOWER YOUR CHOLESTEROL:  Diet.  Choose fish or white meat chicken and [place], roasted or baked. Limit fatty cuts of red meat, fried foods, and processed meats, such as sausage and lunch meat.  Eat lots of fresh fruits and vegetables. Choose whole grains, beans, pasta, potatoes and cereals.  Use only small amounts of olive, corn or canola oils. Avoid butter, mayonnaise, shortening or palm kernel oils. Avoid  foods with trans-fats.  Use skim/nonfat milk and low-fat/nonfat yogurt and cheeses. Avoid whole milk, cream, ice cream, egg yolks and cheeses. Healthy desserts include angel food cake, ginger snaps, animal crackers, hard candy, popsicles, and low-fat/nonfat frozen yogurt. Avoid pastries, cakes, pies and cookies.  Exercise.  A regular program helps decrease LDL and raises HDL.  Helps with weight control.  Do things that increase your activity level like gardening, walking, or taking the stairs.  Medication.  May be prescribed by your caregiver to help lowering cholesterol and the risk for heart disease.  You may need medicine even if your levels are normal if you have several risk factors. HOME CARE INSTRUCTIONS   Follow your diet and exercise programs as suggested by your caregiver.  Take medications as directed.  Have blood work done when your caregiver feels it is necessary. MAKE SURE YOU:   Understand these instructions.  Will watch your condition.  Will get help right away if you are not doing well or get worse. Document Released: 05/04/2001 Document Revised: 11/01/2011 Document Reviewed: 10/25/2007 [facility] Patient Information 2014 [place], [place].

## 2013-05-08 NOTE — Assessment & Plan Note (Addendum)
Pap today, no concerns on exam.  

## 2013-05-09 LAB — URINALYSIS
Glucose, UA: NEGATIVE mg/dL
Leukocytes, UA: NEGATIVE
Specific Gravity, Urine: 1.008 (ref 1.005–1.030)
pH: 6 (ref 5.0–8.0)

## 2013-05-09 NOTE — Progress Notes (Signed)
Quick Note:  Patients spouse Informed and voiced understanding.   lab order placed ______

## 2013-05-09 NOTE — Progress Notes (Signed)
Quick Note:  Patients spouse Informed and voiced understanding ______ 

## 2013-05-12 ENCOUNTER — Encounter: Payer: Self-pay | Admitting: Family Medicine

## 2013-05-12 DIAGNOSIS — Z0000 Encounter for general adult medical examination without abnormal findings: Secondary | ICD-10-CM

## 2013-05-12 HISTORY — DX: Encounter for general adult medical examination without abnormal findings: Z00.00

## 2013-05-12 NOTE — Assessment & Plan Note (Signed)
Encouraged complete cessation once again 

## 2013-05-12 NOTE — Assessment & Plan Note (Addendum)
Encouraged MGM but patient declined despite risk. Declines colonoscopy. No advanced directives, doing well with ADLs, no falls or depression.

## 2013-05-12 NOTE — Assessment & Plan Note (Signed)
Is following with Dr Jearld Fenton, is preparing for surgery

## 2013-05-12 NOTE — Assessment & Plan Note (Signed)
Well controlled, no changes 

## 2013-05-12 NOTE — Assessment & Plan Note (Signed)
Tolerating Atorvasatin, avoid trans fats, add krill oil caps, increase exercise

## 2013-06-29 ENCOUNTER — Encounter (HOSPITAL_COMMUNITY): Payer: Self-pay

## 2013-06-29 ENCOUNTER — Ambulatory Visit (HOSPITAL_COMMUNITY)
Admission: RE | Admit: 2013-06-29 | Discharge: 2013-06-29 | Disposition: A | Discharge status: Home / Self Care | Payer: Medicare Other | Source: Ambulatory Visit | Attending: Otolaryngology | Admitting: Otolaryngology

## 2013-06-29 VITALS — BP 131/60 | HR 88 | Resp 16

## 2013-06-29 DIAGNOSIS — M799 Soft tissue disorder, unspecified: Secondary | ICD-10-CM | POA: Insufficient documentation

## 2013-06-29 DIAGNOSIS — Z79899 Other long term (current) drug therapy: Secondary | ICD-10-CM | POA: Insufficient documentation

## 2013-06-29 DIAGNOSIS — I10 Essential (primary) hypertension: Secondary | ICD-10-CM | POA: Insufficient documentation

## 2013-06-29 DIAGNOSIS — H7191 Unspecified cholesteatoma, right ear: Secondary | ICD-10-CM

## 2013-06-29 DIAGNOSIS — H9190 Unspecified hearing loss, unspecified ear: Secondary | ICD-10-CM | POA: Insufficient documentation

## 2013-06-29 DIAGNOSIS — H7190 Unspecified cholesteatoma, unspecified ear: Secondary | ICD-10-CM | POA: Insufficient documentation

## 2013-06-29 DIAGNOSIS — R259 Unspecified abnormal involuntary movements: Secondary | ICD-10-CM | POA: Insufficient documentation

## 2013-06-29 DIAGNOSIS — F17200 Nicotine dependence, unspecified, uncomplicated: Secondary | ICD-10-CM | POA: Insufficient documentation

## 2013-06-29 DIAGNOSIS — E785 Hyperlipidemia, unspecified: Secondary | ICD-10-CM | POA: Insufficient documentation

## 2013-06-29 MED ORDER — FENTANYL CITRATE 0.05 MG/ML IJ SOLN
25.0000 ug | INTRAMUSCULAR | Status: DC | PRN
  Filled 2013-06-29: qty 4

## 2013-06-29 MED ORDER — MIDAZOLAM HCL 2 MG/2ML IJ SOLN
INTRAMUSCULAR | Status: AC
  Administered 2013-06-29: 1 mg via INTRAVENOUS
  Filled 2013-06-29: qty 2

## 2013-06-29 MED ORDER — FENTANYL CITRATE 0.05 MG/ML IJ SOLN
INTRAMUSCULAR | Status: AC
  Filled 2013-06-29: qty 2

## 2013-06-29 MED ORDER — MIDAZOLAM HCL 2 MG/2ML IJ SOLN
1.0000 mg | INTRAMUSCULAR | Status: DC | PRN
  Administered 2013-06-29 (×2): 1 mg via INTRAVENOUS
  Administered 2013-06-29: 2 mg via INTRAVENOUS
  Filled 2013-06-29: qty 10

## 2013-06-29 NOTE — Progress Notes (Signed)
Pt. Brought to [facility] awake and alert @1135 . VSS.  Pt. Had soda to drink per her request. IV d/c'ed. Pt. Had no c/o. Discharged to [employer] entrance via w/c accompanied by RN and her husband. Instructed pt. Not to drive, operate any machinery or sign any important papers today. She verbalized understanding.

## 2013-06-29 NOTE — H&P (Signed)
Chief Complaint: "I am here for a CT with sedation." HPI: Renee [name] is an 67 y.o. female with history of an essential head tremor and right ear cholesteatoma, she is here today for a CT of her temporal bones without contrast with conscious sedation. She denies any history of sleep apnea or any known allergies to versed or fentanyl. She denies any active shortness of breath or chest pain. She has been NPO this morning and has a driver present today.   Past Medical History:  Past Medical History  Diagnosis Date  . Hyperlipidemia   . Tobacco abuse   . Tremor, essential   . Hypertension   . Allergic rhinitis   . Hearing loss in right ear 09/19/2008    Qualifier: Diagnosis of  By: Nena Jordan   . 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    Qualifier: Diagnosis of  By: Nena Jordan   . Preventative health care 05/12/2013    Past Surgical History:  Past Surgical History  Procedure Laterality Date  . Mouth surgery  01/22/11    lower denture secured by titanium bolts    Family History:  Family History  Problem Relation Age of Onset  . Colon cancer Neg Hx   . Breast cancer Neg Hx   . Prostate cancer Neg Hx   . Diabetes Neg Hx   . Heart attack Father 38    deceased  . Stroke Mother   . Hypertension Mother   . Alcohol abuse Mother   . Obesity Daughter   . Heart disease Paternal Grandmother     AAA rupture  . Heart disease Paternal Grandfather     MI    Social History:  reports that she has been smoking Cigarettes.  She has been smoking about 0.50 packs per day. She has never used smokeless tobacco. She reports that she drinks about 1.2 ounces of alcohol per week. She reports that she does not use illicit drugs.  Allergies:  Allergies  Allergen Reactions  . Amoxicillin     REACTION: TROUBLE BREATHING    Medications:   Medication List    ASK your doctor about these medications       atorvastatin 10 MG tablet  Commonly known as:  LIPITOR  Take 1 tablet (10 mg  total) by mouth daily.     b complex vitamins tablet  Take 1 tablet by mouth every other day.     multivitamin tablet  Take 1 tablet by mouth daily.       Please HPI for pertinent positives, otherwise complete 10 system ROS negative.  Physical Exam: BP 156/70  Pulse 99  Resp 16  SpO2 98% There is no weight on file to calculate BMI.  General Appearance:  Alert, cooperative, no distress  Head:  Normocephalic, without obvious abnormality, atraumatic  Neck: Supple, symmetrical, trachea midline  Lungs:   Clear to auscultation bilaterally, no w/r/r, respirations unlabored without use of accessory muscles.  Chest Wall:  No tenderness or deformity  Heart:  Regular rate and rhythm, S1, S2 normal, no murmur, rub or gallop.  Abdomen:   Soft, non-tender, non distended.  Extremities: Extremities normal, atraumatic, no cyanosis or edema   No results found for this or any previous visit (from the past 48 hour(s)). No results found.  Assessment/Plan Essential head tremor. Cholesteatoma of right ear. Scheduled for CT temporal bones without contrast with conscious sedation. Risks and Benefits discussed with the patient. All of the patient's questions were  answered, patient is agreeable to proceed. Consent signed and in chart.   Renee Boss D PA-C 06/29/2013, 10:46 AM

## 2013-07-05 ENCOUNTER — Other Ambulatory Visit: Payer: Self-pay | Admitting: Family Medicine

## 2013-07-05 ENCOUNTER — Telehealth: Payer: Self-pay | Admitting: Internal Medicine

## 2013-07-05 DIAGNOSIS — Z23 Encounter for immunization: Secondary | ICD-10-CM

## 2013-07-05 MED ORDER — ZOSTER VACCINE LIVE 19400 UNT/0.65ML ~~LOC~~ SOLR: 0.6500 mL | Freq: Once | SUBCUTANEOUS | Status: DC

## 2013-07-05 NOTE — Telephone Encounter (Signed)
Send rx to pharmacy

## 2013-07-05 NOTE — Telephone Encounter (Signed)
Patient is requesting that we fax over a zostavax rx to [facility] on [address]. She states that she will call her ins to see if this is covered.

## 2013-07-26 ENCOUNTER — Other Ambulatory Visit: Payer: Self-pay | Admitting: Otolaryngology

## 2013-08-23 HISTORY — PX: INNER EAR SURGERY: SHX679

## 2014-04-22 ENCOUNTER — Encounter: Payer: Self-pay | Admitting: Physician Assistant

## 2014-04-22 ENCOUNTER — Ambulatory Visit (INDEPENDENT_AMBULATORY_CARE_PROVIDER_SITE_OTHER): Payer: Medicare Other | Admitting: Physician Assistant

## 2014-04-22 VITALS — BP 120/80 | HR 89 | Temp 98.0°F | Wt 157.0 lb

## 2014-04-22 DIAGNOSIS — S6000XA Contusion of unspecified finger without damage to nail, initial encounter: Secondary | ICD-10-CM | POA: Insufficient documentation

## 2014-04-22 NOTE — Progress Notes (Signed)
Pre visit review using our clinic review tool, if applicable. No additional management support is needed unless otherwise documented below in the visit note. 

## 2014-04-22 NOTE — Progress Notes (Signed)
Patient presents to clinic today c/o pain and bruising of her left ring finger first noted a few days ago while throwing an onion in the trash.  Endorses sharp pain then later noticed bruising and swelling.  Denies trauma or injury.   States she assumed she had been bitten but denies noting "bite".  Denies numbness or tingling.  Denies pallor of extremity.  Past Medical History  Diagnosis Date  . Hyperlipidemia   . Tobacco abuse   . Tremor, essential   . Hypertension   . Allergic rhinitis   . 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 Preventative health care 05/12/2013    Current Outpatient Prescriptions on File Prior to Visit  Medication Sig Dispense Refill  . b complex vitamins tablet Take 1 tablet by mouth every other day.      . Multiple Vitamin (MULTIVITAMIN) tablet Take 1 tablet by mouth daily.      Marland Kitchen atorvastatin (LIPITOR) 10 MG tablet Take 1 tablet (10 mg total) by mouth daily.  30 tablet  3  . zoster vaccine live, PF, (ZOSTAVAX) 08657 UNT/0.65ML injection Inject 19,400 Units into the skin once.  1 each  0   No current facility-administered medications on file prior to visit.    Allergies  Allergen Reactions  . Amoxicillin     REACTION: TROUBLE BREATHING    Family History  Problem Relation Age of Onset  . Colon cancer Neg Hx   . Breast cancer Neg Hx   . Prostate cancer Neg Hx   . Diabetes Neg Hx   . Heart attack Father 65    deceased  . Stroke Mother   . Hypertension Mother   . Alcohol abuse Mother   . Obesity Daughter   . Heart disease Paternal Grandmother     AAA rupture  . Heart disease Paternal Grandfather     MI    History   Social History  . Marital Status: Married    Spouse Name: N/A    Number of Children: N/A  . Years of Education: N/A   Social History Main Topics  . Smoking status: Current Every Day Smoker  -- 0.50 packs/day    Types: Cigarettes  . Smokeless tobacco: Never Used     Comment: 5 cigarettes daily  . Alcohol Use: 1.2 oz/week    2 Glasses of wine per week  . Drug Use: No  . Sexual Activity: None   Other Topics Concern  . None   Social History Narrative  . None   Review of Systems - See HPI.  All other ROS are negative.  BP 120/80  Pulse 89  Temp(Src) 98 F (36.7 C)  Wt 157 lb (71.215 kg)  SpO2 99%  Physical Exam  Vitals reviewed. Constitutional: She is oriented to person, place, and time and well-developed, well-nourished, and in no distress.  HENT:  Head: Normocephalic.  Eyes: Conjunctivae are normal. Pupils are equal, round, and reactive to light.  Cardiovascular: Normal rate, regular rhythm, normal heart sounds and intact distal pulses.   Pulses 2+ and equal bilaterally of upper extremities.  Good capillary refill noted bilaterally.  Pulmonary/Chest: Effort normal and breath sounds normal.  Neurological: She is alert and oriented to person, place, and time.  Skin: Skin is warm and dry.  Presence of ecchymoses  of left 3rd phalanx without tenderness. Seems consistent with ruptured hematoma.  Psychiatric: Affect normal.   Assessment/Plan: Nontraumatic Hematoma of Finger Discussed benign etiology and prognosis.  Ice if needed.  Call or return to clinic if symptoms are not continuing to improve.

## 2014-04-22 NOTE — Assessment & Plan Note (Signed)
Discussed benign etiology and prognosis.  Ice if needed.  Call or return to clinic if symptoms are not continuing to improve.

## 2014-04-22 NOTE — Patient Instructions (Addendum)
Please read information below on Hematomas.  This is a benign finding and should start to improve. You can use some Ice to help with swelling and tenderness.  Call or return to clinic if new symptoms develop.  Hematoma A hematoma is a collection of blood. The collection of blood can turn into a hard, painful lump under the skin. Your skin may turn blue or yellow if the hematoma is close to the surface of the skin. Most hematomas get better in a few days to weeks. Some hematomas are serious and need medical care. Hematomas can be very small or very big. HOME CARE  Apply ice to the injured area:  Put ice in a plastic bag.  Place a towel between your skin and the bag.  Leave the ice on for 20 minutes, 2-3 times a day for the first 1 to 2 days.  After the first 2 days, switch to using warm packs on the injured area.  Raise (elevate) the injured area to lessen pain and puffiness (swelling). You may also wrap the area with an elastic bandage. Make sure the bandage is not wrapped too tight.  If you have a painful hematoma on your leg or foot, you may use crutches for a couple days.  Only take medicines as told by your doctor. GET HELP RIGHT AWAY IF:   Your pain gets worse.  Your pain is not controlled with medicine.  You have a fever.  Your puffiness gets worse.  Your skin turns more blue or yellow.  Your skin over the hematoma breaks or starts bleeding.  Your hematoma is in your chest or belly (abdomen) and you are short of breath, feel weak, or have a change in consciousness.  Your hematoma is on your scalp and you have a headache that gets worse or a change in alertness or consciousness. MAKE SURE YOU:   Understand these instructions.  Will watch your condition.  Will get help right away if you are not doing well or get worse. Document Released: 09/16/2004 Document Revised: 04/11/2013 Document Reviewed: 01/17/2013 [facility] Patient Information 2015 [place], [place]. This  information is not intended to replace advice given to you by your health care provider. Make sure you discuss any questions you have with your health care provider.

## 2015-08-07 DIAGNOSIS — L570 Actinic keratosis: Secondary | ICD-10-CM | POA: Diagnosis not present

## 2015-08-07 DIAGNOSIS — L720 Epidermal cyst: Secondary | ICD-10-CM | POA: Diagnosis not present

## 2015-08-20 DIAGNOSIS — H1045 Other chronic allergic conjunctivitis: Secondary | ICD-10-CM | POA: Diagnosis not present

## 2015-08-20 DIAGNOSIS — H2513 Age-related nuclear cataract, bilateral: Secondary | ICD-10-CM | POA: Diagnosis not present

## 2015-08-20 DIAGNOSIS — H524 Presbyopia: Secondary | ICD-10-CM | POA: Diagnosis not present

## 2015-09-01 ENCOUNTER — Ambulatory Visit (INDEPENDENT_AMBULATORY_CARE_PROVIDER_SITE_OTHER): Payer: Medicare Other | Admitting: Physician Assistant

## 2015-09-01 ENCOUNTER — Ambulatory Visit: Payer: Self-pay | Admitting: Physician Assistant

## 2015-09-01 ENCOUNTER — Encounter: Payer: Self-pay | Admitting: Physician Assistant

## 2015-09-01 VITALS — BP 130/80 | HR 102 | Temp 98.4°F | Ht 66.0 in | Wt 151.6 lb

## 2015-09-01 DIAGNOSIS — B9689 Other specified bacterial agents as the cause of diseases classified elsewhere: Secondary | ICD-10-CM

## 2015-09-01 DIAGNOSIS — J00 Acute nasopharyngitis [common cold]: Secondary | ICD-10-CM

## 2015-09-01 DIAGNOSIS — J208 Acute bronchitis due to other specified organisms: Principal | ICD-10-CM

## 2015-09-01 MED ORDER — AZITHROMYCIN 250 MG PO TABS: ORAL_TABLET | ORAL | Status: DC

## 2015-09-01 MED ORDER — ALBUTEROL SULFATE HFA 108 (90 BASE) MCG/ACT IN AERS: 2.0000 | INHALATION_SPRAY | Freq: Four times a day (QID) | RESPIRATORY_TRACT | Status: DC | PRN

## 2015-09-01 MED ORDER — BENZONATATE 100 MG PO CAPS: 100.0000 mg | ORAL_CAPSULE | Freq: Three times a day (TID) | ORAL | Status: DC | PRN

## 2015-09-01 NOTE — Progress Notes (Signed)
Patient presents to clinic today c/o chest congestion, cough productive of green sputum and R ear pain x 10 days. Patient endorses some intermittent fevers that have been low-grade along with fatigue. Denies chest pain or SOB. Is a smoker but is working on stopping. Has not smoked since symptoms onset. Denies recent travel. Endorses some contacts with similar symptoms a few weeks ago.  Past Medical History  Diagnosis Date  . Hyperlipidemia   . Tobacco abuse   . Tremor, essential   . Hypertension   . Allergic rhinitis   . 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 Preventative health care 05/12/2013    Current Outpatient Prescriptions on File Prior to Visit  Medication Sig Dispense Refill  . b complex vitamins tablet Take 1 tablet by mouth every other day.    . Multiple Vitamin (MULTIVITAMIN) tablet Take 1 tablet by mouth daily.    Marland Kitchen zoster vaccine live, PF, (ZOSTAVAX) 16109 UNT/0.65ML injection Inject 19,400 Units into the skin once. 1 each 0  . atorvastatin (LIPITOR) 10 MG tablet Take 1 tablet (10 mg total) by mouth daily. (Patient not taking: Reported on 09/01/2015) 30 tablet 3   No current facility-administered medications on file prior to visit.    Allergies  Allergen Reactions  . Amoxicillin     REACTION: TROUBLE BREATHING    Family History  Problem Relation Age of Onset  . Colon cancer Neg Hx   . Breast cancer Neg Hx   . Prostate cancer Neg Hx   . Diabetes Neg Hx   . Heart attack Father 109    deceased  . Stroke Mother   . Hypertension Mother   . Alcohol abuse Mother   . Obesity Daughter   . Heart disease Paternal Grandmother     AAA rupture  . Heart disease Paternal Grandfather     MI    Social History   Social History  . Marital Status: Married    Spouse Name: N/A  . Number of Children: N/A  . Years of Education:  N/A   Social History Main Topics  . Smoking status: Current Every Day Smoker -- 0.50 packs/day    Types: Cigarettes  . Smokeless tobacco: Never Used     Comment: 5 cigarettes daily  . Alcohol Use: 1.2 oz/week    2 Glasses of wine per week  . Drug Use: No  . Sexual Activity: Not Asked   Other Topics Concern  . None   Social History Narrative   Review of Systems - See HPI.  All other ROS are negative.  BP 130/80 mmHg  Pulse 102  Temp(Src) 98.4 F (36.9 C) (Oral)  Ht 5\' 6"  (1.676 m)  Wt 151 lb 9.6 oz (68.765 kg)  BMI 24.48 kg/m2  SpO2 94%  Physical Exam  Constitutional: She is oriented to person, place, and time and well-developed, well-nourished, and in no distress.  HENT:  Head: Normocephalic and atraumatic.  Right Ear: External ear normal.  Left Ear: External ear normal.  Nose: Nose normal.  Mouth/Throat: Oropharynx is clear and moist. No oropharyngeal exudate.  TM within normal limits bilaterally.  Eyes: Conjunctivae are normal.  Cardiovascular: Normal rate, regular rhythm, normal heart sounds and intact distal pulses.   Pulmonary/Chest: Effort normal. No respiratory distress. She has wheezes. She  has no rales. She exhibits no tenderness.  Neurological: She is alert and oriented to person, place, and time.  Skin: Skin is warm and dry. No rash noted.  Psychiatric: Affect normal.  Vitals reviewed.   No results found for this or any previous visit (from the past 2160 hour(s)).  Assessment/Plan: Acute bacterial bronchitis Rx Azithromycin.  Increase fluids.  Rest.  Saline nasal spray.  Probiotic.  Mucinex as directed.  Humidifier in bedroom. Tessalon and Albuterol as directed. Handout given.  Call or return to clinic if symptoms are not improving.

## 2015-09-01 NOTE — Progress Notes (Signed)
Pre visit review using our clinic review tool, if applicable. No additional management support is needed unless otherwise documented below in the visit note. 

## 2015-09-01 NOTE — Patient Instructions (Signed)
Take antibiotic (Azithromycin) as directed.  Increase fluids.  Get plenty of rest. Use Mucinex for congestion. Take Tessalon and Albuterol as directed. Take a daily probiotic (I recommend Align or Culturelle, but even Activia Yogurt may be beneficial).  A humidifier placed in the bedroom may offer some relief for a dry, scratchy throat of nasal irritation.  Read information below on acute bronchitis. Please call or return to clinic if symptoms are not improving.  Acute Bronchitis Bronchitis is when the airways that extend from the windpipe into the lungs get red, puffy, and painful (inflamed). Bronchitis often causes thick spit (mucus) to develop. This leads to a cough. A cough is the most common symptom of bronchitis. In acute bronchitis, the condition usually begins suddenly and goes away over time (usually in 2 weeks). Smoking, allergies, and asthma can make bronchitis worse. Repeated episodes of bronchitis may cause more lung problems.  HOME CARE 1. Rest. 2. Drink enough fluids to keep your pee (urine) clear or pale yellow (unless you need to limit fluids as told by your doctor). 3. Only take over-the-counter or prescription medicines as told by your doctor. 4. Avoid smoking and secondhand smoke. These can make bronchitis worse. If you are a smoker, think about using nicotine gum or skin patches. Quitting smoking will help your lungs heal faster. 5. Reduce the chance of getting bronchitis again by: 1. Washing your hands often. 2. Avoiding people with cold symptoms. 3. Trying not to touch your hands to your mouth, nose, or eyes. 6. Follow up with your doctor as told.  GET HELP IF: Your symptoms do not improve after 1 week of treatment. Symptoms include:  Cough.  Fever.  Coughing up thick spit.  Body aches.  Chest congestion.  Chills.  Shortness of breath.  Sore throat.  GET HELP RIGHT AWAY IF:   You have an increased fever.  You have chills.  You have severe shortness of  breath.  You have bloody thick spit (sputum).  You throw up (vomit) often.  You lose too much body fluid (dehydration).  You have a severe headache.  You faint.  MAKE SURE YOU:   Understand these instructions.  Will watch your condition.  Will get help right away if you are not doing well or get worse. Document Released: 01/26/2008 Document Revised: 04/11/2013 Document Reviewed: 01/30/2013 [facility] Patient Information 2015 [place], [place]. This information is not intended to replace advice given to you by your health care provider. Make sure you discuss any questions you have with your health care provider.  Metered Dose Inhaler (No Spacer Used) Inhaled medicines are the basis of treatment for asthma and other breathing problems. Inhaled medicine can only be effective if used properly. Good technique assures that the medicine reaches the lungs. Metered dose inhalers (MDIs) are used to deliver a variety of inhaled medicines. These include quick relief or rescue medicines (such as bronchodilators) and controller medicines (such as corticosteroids). The medicine is delivered by pushing down on a metal canister to release a set amount of spray. If you are using different kinds of inhalers, use your quick relief medicine to open the airways 10-15 minutes before using a steroid, if instructed to do so by your health care provider. If you are unsure which inhalers to use and the order of using them, ask your health care provider, nurse, or respiratory therapist. HOW TO USE THE INHALER 7. Remove the cap from the inhaler. 8. If you are using the inhaler for the first time,  you will need to prime it. Shake the inhaler for 5 seconds and release four puffs into the air, away from your face. Ask your health care provider or pharmacist if you have questions about priming your inhaler. 9. Shake the inhaler for 5 seconds before each breath in (inhalation). 10. Position the inhaler so that the top of  the canister faces up. 11. Put your index finger on the top of the medicine canister. Your thumb supports the bottom of the inhaler. 12. Open your mouth. 13. Either place the inhaler between your teeth and place your lips tightly around the mouthpiece, or hold the inhaler 1-2 inches away from your open mouth. If you are unsure of which technique to use, ask your health care provider. 14. Breathe out (exhale) normally and as completely as possible. 15. Press the canister down with the index finger to release the medicine. 16. At the same time as the canister is pressed, inhale deeply and slowly until your lungs are completely filled. This should take 4-6 seconds. Keep your tongue down. 17. Hold the medicine in your lungs for 5-10 seconds (10 seconds is best). This helps the medicine get into the small airways of your lungs. 18. Breathe out slowly, through pursed lips. Whistling is an example of pursed lips. 19. Wait at least 1 minute between puffs. Continue with the above steps until you have taken the number of puffs your health care provider has ordered. Do not use the inhaler more than your health care provider directs you to. 20. Replace the cap on the inhaler. 21. Follow the directions from your health care provider or the inhaler insert for cleaning the inhaler. If you are using a steroid inhaler, after your last puff, rinse your mouth with water, gargle, and spit out the water. Do not swallow the water. AVOID:  Inhaling before or after starting the spray of medicine. It takes practice to coordinate your breathing with triggering the spray.  Inhaling through the nose (rather than the mouth) when triggering the spray. HOW TO DETERMINE IF YOUR INHALER IS FULL OR NEARLY EMPTY You cannot know when an inhaler is empty by shaking it. Some inhalers are now being made with dose counters. Ask your health care provider for a prescription that has a dose counter if you feel you need that extra help. If  your inhaler does not have a counter, ask your health care provider to help you determine the date you need to refill your inhaler. Write the refill date on a calendar or your inhaler canister. Refill your inhaler 7-10 days before it runs out. Be sure to keep an adequate supply of medicine. This includes making sure it has not expired, and making sure you have a spare inhaler. SEEK MEDICAL CARE IF:  Symptoms are only partially relieved with your inhaler.  You are having trouble using your inhaler.  You experience an increase in phlegm. SEEK IMMEDIATE MEDICAL CARE IF:  You feel little or no relief with your inhalers. You are still wheezing and feeling shortness of breath, tightness in your chest, or both.  You have dizziness, headaches, or a fast heart rate.  You have chills, fever, or night sweats.  There is a noticeable increase in phlegm production, or there is blood in the phlegm. MAKE SURE YOU:  Understand these instructions.  Will watch your condition.  Will get help right away if you are not doing well or get worse.   This information is not intended to replace advice  given to you by your health care provider. Make sure you discuss any questions you have with your health care provider.   Document Released: 06/06/2007 Document Revised: 08/30/2014 Document Reviewed: 01/25/2013 Elsevier Interactive Patient Education [employer].

## 2015-09-01 NOTE — Assessment & Plan Note (Signed)
Rx Azithromycin.  Increase fluids.  Rest.  Saline nasal spray.  Probiotic.  Mucinex as directed.  Humidifier in bedroom. Tessalon and Albuterol as directed. Handout given.  Call or return to clinic if symptoms are not improving.

## 2015-10-03 ENCOUNTER — Ambulatory Visit (INDEPENDENT_AMBULATORY_CARE_PROVIDER_SITE_OTHER): Payer: Medicare Other

## 2015-10-03 VITALS — BP 150/80 | HR 97 | Ht 66.0 in | Wt 153.0 lb

## 2015-10-03 DIAGNOSIS — Z0000 Encounter for general adult medical examination without abnormal findings: Secondary | ICD-10-CM | POA: Diagnosis not present

## 2015-10-03 DIAGNOSIS — Z1239 Encounter for other screening for malignant neoplasm of breast: Secondary | ICD-10-CM

## 2015-10-03 DIAGNOSIS — Z13820 Encounter for screening for osteoporosis: Secondary | ICD-10-CM

## 2015-10-03 NOTE — Patient Instructions (Addendum)
Bring in a copy of advanced directive or living will at next visit.   Schedule mammogram and bone density.  Please consider completing colon screening (Colonoscopy and Cologuard)  Eat heart healthy diet (full of fruits, vegetables, whole grains, lean protein, water--limit salt, fat, and sugar intake) and increase physical activity as tolerated.  Continue doing brain stimulating activities (puzzles, reading, adult coloring books, staying active) to keep memory sharp.   Follow up with Dr. Charlett Blake as scheduled.      Bone Densitometry Bone densitometry is an imaging test that uses a special X-ray to measure the amount of calcium and other minerals in your bones (bone density). This test is also known as a bone mineral density test or dual-energy X-ray absorptiometry (DXA). The test can measure bone density at your hip and your spine. It is similar to having a regular X-ray. You may have this test to:  Diagnose a condition that causes weak or thin bones (osteoporosis).  Predict your risk of a broken bone (fracture).  Determine how well osteoporosis treatment is working. LET [facility] CARE PROVIDER KNOW ABOUT:  Any allergies you have.  All medicines you are taking, including vitamins, herbs, eye drops, creams, and over-the-counter medicines.  Previous problems you or members of your family have had with the use of anesthetics.  Any blood disorders you have.  Previous surgeries you have had.  Medical conditions you have.  Possibility of pregnancy.  Any other medical test you had within the previous 14 days that used contrast material. RISKS AND COMPLICATIONS Generally, this is a safe procedure. However, problems can occur and may include the following:  This test exposes you to a very small amount of radiation.  The risks of radiation exposure may be greater to unborn children. BEFORE THE PROCEDURE  Do not take any calcium supplements for 24 hours before having the test. You  can otherwise eat and drink what you usually do.  Take off all metal jewelry, eyeglasses, dental appliances, and any other metal objects. PROCEDURE  You may lie on an exam table. There will be an X-ray generator below you and an imaging device above you.  Other devices, such as boxes or braces, may be used to position your body properly for the scan.  You will need to lie still while the machine slowly scans your body.  The images will show up on a computer monitor. AFTER THE PROCEDURE You may need more testing at a later time.   This information is not intended to replace advice given to you by your health care provider. Make sure you discuss any questions you have with your health care provider.   Document Released: 08/31/2004 Document Revised: 08/30/2014 Document Reviewed: 01/17/2014 Elsevier Interactive Patient Education 2016 [employer].  Colonoscopy A colonoscopy is an exam to look at the entire large intestine (colon). This exam can help find problems such as tumors, polyps, inflammation, and areas of bleeding. The exam takes about 1 hour.  LET [facility] CARE PROVIDER KNOW ABOUT:   Any allergies you have.  All medicines you are taking, including vitamins, herbs, eye drops, creams, and over-the-counter medicines.  Previous problems you or members of your family have had with the use of anesthetics.  Any blood disorders you have.  Previous surgeries you have had.  Medical conditions you have. RISKS AND COMPLICATIONS  Generally, this is a safe procedure. However, as with any procedure, complications can occur. Possible complications include:  Bleeding.  Tearing or rupture of the  colon wall.  Reaction to medicines given during the exam.  Infection (rare). BEFORE THE PROCEDURE   Ask your health care provider about changing or stopping your regular medicines.  You may be prescribed an oral bowel prep. This involves drinking a large amount of medicated liquid,  starting the day before your procedure. The liquid will cause you to have multiple loose stools until your stool is almost clear or light green. This cleans out your colon in preparation for the procedure.  Do not eat or drink anything else once you have started the bowel prep, unless your health care provider tells you it is safe to do so.  Arrange for someone to drive you home after the procedure. PROCEDURE   You will be given medicine to help you relax (sedative).  You will lie on your side with your knees bent.  A long, flexible tube with a light and camera on the end (colonoscope) will be inserted through the rectum and into the colon. The camera sends video back to a computer screen as it moves through the colon. The colonoscope also releases carbon dioxide gas to inflate the colon. This helps your health care provider see the area better.  During the exam, your health care provider may take a small tissue sample (biopsy) to be examined under a microscope if any abnormalities are found.  The exam is finished when the entire colon has been viewed. AFTER THE PROCEDURE   Do not drive for 24 hours after the exam.  You may have a small amount of blood in your stool.  You may pass moderate amounts of gas and have mild abdominal cramping or bloating. This is caused by the gas used to inflate your colon during the exam.  Ask when your test results will be ready and how you will get your results. Make sure you get your test results.   This information is not intended to replace advice given to you by your health care provider. Make sure you discuss any questions you have with your health care provider.   Document Released: 08/06/2000 Document Revised: 05/30/2013 Document Reviewed: 04/16/2013 Elsevier Interactive Patient Education 2016 [place] A mammogram is an X-ray of the breasts that is done to check for abnormal changes. This procedure can screen for and detect any  changes that may suggest breast cancer. A mammogram can also identify other changes and variations in the breast, such as:  Inflammation of the breast tissue (mastitis).  An infected area that contains a collection of pus (abscess).  A fluid-filled sac (cyst).  Fibrocystic changes. This is when breast tissue becomes denser, which can make the tissue feel rope-like or uneven under the skin.  Tumors that are not cancerous (benign). LET [facility] CARE PROVIDER KNOW ABOUT:  Any allergies you have.  If you have breast implants.  If you have had previous breast disease, biopsy, or surgery.  If you are breastfeeding.  Any possibility that you could be pregnant, if this applies.  If you are younger than age 67.  If you have a family history of breast cancer. RISKS AND COMPLICATIONS Generally, this is a safe procedure. However, problems may occur, including:  Exposure to radiation. Radiation levels are very low with this test.  The results being misinterpreted.  The need for further tests.  The inability of the mammogram to detect certain cancers. BEFORE THE PROCEDURE  Schedule your test about 1-2 weeks after your menstrual period. This is usually when your  breasts are the least tender.  If you have had a mammogram done at a different facility in the past, get the mammogram X-rays or have them sent to your current exam facility in order to compare them.  Wash your breasts and under your arms the day of the test.  Do not wear deodorants, perfumes, lotions, or powders anywhere on your body on the day of the test.  Remove any jewelry from your neck.  Wear clothes that you can change into and out of easily. PROCEDURE  You will undress from the waist up and put on a gown.  You will stand in front of the X-ray machine.  Each breast will be placed between two plastic or glass plates. The plates will compress your breast for a few seconds. Try to stay as relaxed as possible  during the procedure. This does not cause any harm to your breasts and any discomfort you feel will be very brief.  X-rays will be taken from different angles of each breast. The procedure may vary among health care providers and hospitals. AFTER THE PROCEDURE  The mammogram will be examined by a specialist (radiologist).  You may need to repeat certain parts of the test, depending on the quality of the images. This is commonly done if the radiologist needs a better view of the breast tissue.  Ask when your test results will be ready. Make sure you get your test results.  You may resume your normal activities.   This information is not intended to replace advice given to you by your health care provider. Make sure you discuss any questions you have with your health care provider.   Document Released: 08/06/2000 Document Revised: 04/30/2015 Document Reviewed: 10/18/2014 Elsevier Interactive Patient Education [employer].

## 2015-10-03 NOTE — Progress Notes (Signed)
RN AWV note reviewed. Agree with documention and plan. 

## 2015-10-03 NOTE — Progress Notes (Signed)
Pre visit review using our clinic review tool, if applicable. No additional management support is needed unless otherwise documented below in the visit note. 

## 2015-10-03 NOTE — Progress Notes (Signed)
Subjective:   Renee [name] is a 70 y.o. female who presents for Medicare Annual (Subsequent) preventive examination.  Review of Systems: No ROS Cardiac Risk Factors include: advanced age (>45mn, >>35women);smoking/ tobacco exposure;hypertension;family history of premature cardiovascular disease (hyperlipidemia) Sleep patterns:  Sleeps at least 6 hours per night/wakes up at least once to void.   Home Safety/Smoke Alarms: Feels safe at home.  Lives with husband in 2 story home.  Smoke alarms present.   Firearm Safety:  Kept in safe place.   Seat Belt Safety/Bike Helmet:  Always wears seat belt.    Counseling:   Eye Exam- 08/2015  Dental- Upper and lower dentures. Female:  Pap- 05/08/13-normal   Mammo-ordered     Dexa scan-ordered      CCS-never had one; no family hx of colon cancer; prefers not to order at this time.    Immunizations: Flu-declined Prevnar-declined Zostavax-wants to speak to provider about vaccine.       Objective:     Vitals: BP 150/80 mmHg  Pulse 97  Ht 5' 6" (1.676 m)  Wt 153 lb (69.4 kg)  BMI 24.71 kg/m2  SpO2 97%  Tobacco History  Smoking status  . Current Every Day Smoker -- 0.50 packs/day  . Types: Cigarettes  Smokeless tobacco  . Never Used    Comment: Has not smoked a cigarette in 6 weeks.       Ready to quit: Yes Counseling given: Yes   Past Medical History  Diagnosis Date  . Hyperlipidemia   . Tobacco abuse   . Tremor, essential   . Hypertension   . Allergic rhinitis   . 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 Preventative health care 05/12/2013   Past Surgical History  Procedure Laterality Date  . Mouth surgery  01/22/11    lower denture secured by titanium bolts  . Inner ear surgery  2015    with tube   Family History  Problem Relation Age of Onset  . Colon cancer Neg Hx   . Breast  cancer Neg Hx   . Prostate cancer Neg Hx   . Diabetes Neg Hx   . Heart attack Father 519   deceased  . Stroke Mother   . Hypertension Mother   . Alcohol abuse Mother   . Obesity Daughter   . Heart disease Paternal Grandmother     AAA rupture  . Heart disease Paternal Grandfather     MI   History  Sexual Activity  . Sexual Activity: Not on file    Outpatient Encounter Prescriptions as of 10/03/2015  Medication Sig  . b complex vitamins tablet Take 1 tablet by mouth every other day.  . Multiple Vitamin (MULTIVITAMIN) tablet Take 1 tablet by mouth daily.  .Marland Kitchenalbuterol (PROVENTIL HFA;VENTOLIN HFA) 108 (90 Base) MCG/ACT inhaler Inhale 2 puffs into the lungs every 6 (six) hours as needed for wheezing or shortness of breath. (Patient not taking: Reported on 10/03/2015)  . atorvastatin (LIPITOR) 10 MG tablet Take 1 tablet (10 mg total) by mouth daily. (Patient not taking: Reported on 09/01/2015)  . zoster vaccine live, PF, (ZOSTAVAX) 134193UNT/0.65ML injection Inject 19,400 Units into the skin once. (Patient not taking: Reported on 10/03/2015)  . [DISCONTINUED] azithromycin (ZITHROMAX) 250 MG tablet Take 2 tablets on Day  1. Then take 1 tablet daily.  . [DISCONTINUED] benzonatate (TESSALON) 100 MG capsule Take 1 capsule (100 mg total) by mouth 3 (three) times daily as needed for cough.   No facility-administered encounter medications on file as of 10/03/2015.    Activities of Daily Living In your present state of health, do you have any difficulty performing the following activities: 10/03/2015  Hearing? Y  Vision? N  Difficulty concentrating or making decisions? N  Walking or climbing stairs? Y  Dressing or bathing? N  Doing errands, shopping? N  Preparing Food and eating ? N  Using the Toilet? N  In the past six months, have you accidently leaked urine? N  Do you have problems with loss of bowel control? N  Managing your Medications? N  Managing your Finances? N  Housekeeping or  managing your Housekeeping? N    Patient Care Team: Stacey A Blyth, MD as PCP - General (Family Medicine) John Byers, MD as Consulting Physician (Otolaryngology)    Assessment:  Physical assessment deferred to PCP during CPE.    Exercise Activities and Dietary recommendations Current Exercise Habits:: Exercise is limited by;Structured exercise class, Type of exercise: strength training/weights (tai chi), Time (Minutes): > 60 (2 hours), Frequency (Times/Week): 3, Weekly Exercise (Minutes/Week): 0   Diet:  Regular diet.  Eats 2 meals per day.  Watching saturated fat intake.  Eating more yogurt.    Goals    . Complete mammogram and bone density by the end of this year.      . Quit smoking / using tobacco      Fall Risk Fall Risk  10/03/2015 05/12/2013  Falls in the past year? No No   Depression Screen PHQ 2/9 Scores 10/03/2015 05/12/2013  PHQ - 2 Score 0 0     Cognitive Testing MMSE - Mini Mental State Exam 10/03/2015  Orientation to time 5  Orientation to Place 5  Registration 3  Attention/ Calculation 5  Recall 3  Language- name 2 objects 2  Language- repeat 1  Language- follow 3 step command 3  Language- read & follow direction 1  Write a sentence 1  Copy design 1  Total score 30    There is no immunization history for the selected administration types on file for this patient. Screening Tests Health Maintenance  Topic Date Due  . Hepatitis C Screening  01/16/1946  . TETANUS/TDAP  01/30/1965  . DEXA SCAN  01/31/2011  . PNA vac Low Risk Adult (1 of 2 - PCV13) 01/31/2011  . MAMMOGRAM  11/22/2013  . INFLUENZA VACCINE  11/21/2015 (Originally 03/24/2015)  . COLONOSCOPY  11/22/2021  . ZOSTAVAX  Addressed      Plan:  Bring in a copy of advanced directive or living will at next visit.   Schedule mammogram and bone density.  Please consider completing colon screening (Colonoscopy and Cologuard)  Eat heart healthy diet (full of fruits, vegetables, whole grains, lean  protein, water--limit salt, fat, and sugar intake) and increase physical activity as tolerated.  Continue doing brain stimulating activities (puzzles, reading, adult coloring books, staying active) to keep memory sharp.   Follow up with Dr. Blyth as scheduled.     During the course of the visit the patient was educated and counseled about the following appropriate screening and preventive services:   Vaccines to include Pneumoccal, Influenza, Hepatitis B, Td, Zostavax, HCV  Electrocardiogram  Cardiovascular Disease  Colorectal cancer screening  Bone density screening  Diabetes screening  Glaucoma screening  Mammography/PAP    Nutrition counseling   Patient Instructions (the written plan) was given to the patient.   Rudene Anda, RN  10/03/2015

## 2015-10-06 ENCOUNTER — Other Ambulatory Visit: Payer: Self-pay | Admitting: Family Medicine

## 2015-10-06 DIAGNOSIS — Z1231 Encounter for screening mammogram for malignant neoplasm of breast: Secondary | ICD-10-CM

## 2015-10-07 ENCOUNTER — Ambulatory Visit: Payer: Self-pay

## 2015-10-07 ENCOUNTER — Encounter: Payer: Self-pay | Admitting: Physician Assistant

## 2015-10-07 ENCOUNTER — Ambulatory Visit (INDEPENDENT_AMBULATORY_CARE_PROVIDER_SITE_OTHER): Payer: Medicare Other | Admitting: Physician Assistant

## 2015-10-07 VITALS — BP 158/91 | HR 84 | Temp 98.1°F | Ht 66.0 in | Wt 150.4 lb

## 2015-10-07 DIAGNOSIS — M542 Cervicalgia: Secondary | ICD-10-CM

## 2015-10-07 HISTORY — DX: Cervicalgia: M54.2
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
## 2015-10-07 NOTE — Patient Instructions (Signed)
Please avoid heavy lifting and overexertion. Continue over-the-counter Ibuprofen. Apply topical Icy Hot to the neck. Apply heat to the area in 10 minute intervals a few times per day.  Let me know if symptoms are not continuing to resolve.

## 2015-10-07 NOTE — Progress Notes (Signed)
Pre visit review using our clinic review tool, if applicable. No additional management support is needed unless otherwise documented below in the visit note. 

## 2015-10-07 NOTE — Assessment & Plan Note (Signed)
Muscular etiology. Resolving on its own. RICE and ibuprofen. Other supportive measures reviewed. Follow-up if symptoms are not completely resolved.

## 2015-10-14 ENCOUNTER — Ambulatory Visit: Payer: Self-pay | Admitting: Physician Assistant

## 2016-01-29 ENCOUNTER — Encounter: Payer: Self-pay | Admitting: Family Medicine

## 2016-01-29 ENCOUNTER — Ambulatory Visit (INDEPENDENT_AMBULATORY_CARE_PROVIDER_SITE_OTHER): Payer: Medicare Other | Admitting: Family Medicine

## 2016-01-29 VITALS — BP 138/88 | HR 90 | Temp 98.6°F | Ht 66.0 in | Wt 164.4 lb

## 2016-01-29 DIAGNOSIS — E782 Mixed hyperlipidemia: Secondary | ICD-10-CM | POA: Diagnosis not present

## 2016-01-29 DIAGNOSIS — J309 Allergic rhinitis, unspecified: Secondary | ICD-10-CM

## 2016-01-29 DIAGNOSIS — Z8739 Personal history of other diseases of the musculoskeletal system and connective tissue: Secondary | ICD-10-CM

## 2016-01-29 DIAGNOSIS — Z780 Asymptomatic menopausal state: Secondary | ICD-10-CM

## 2016-01-29 DIAGNOSIS — Z0000 Encounter for general adult medical examination without abnormal findings: Secondary | ICD-10-CM

## 2016-01-29 DIAGNOSIS — I10 Essential (primary) hypertension: Secondary | ICD-10-CM

## 2016-01-29 DIAGNOSIS — Z0001 Encounter for general adult medical examination with abnormal findings: Secondary | ICD-10-CM

## 2016-01-29 NOTE — Progress Notes (Signed)
Pre visit review using our clinic review tool, if applicable. No additional management support is needed unless otherwise documented below in the visit note. 

## 2016-01-29 NOTE — Patient Instructions (Signed)
Encouraged increased rest and hydration, add probiotics, zinc such as Coldeze or Xicam. Treat fevers as needed. Plain Mucinex iquitwice. Elderberry liquid or capsules, vitamin C 500 to 1000 mg daily   Preventive Care for Adults, Female A healthy lifestyle and preventive care can promote health and wellness. Preventive health guidelines for women include the following key practices.  A routine yearly physical is a good way to check with your health care provider about your health and preventive screening. It is a chance to share any concerns and updates on your health and to receive a thorough exam.  Visit your dentist for a routine exam and preventive care every 6 months. Brush your teeth twice a day and floss once a day. Good oral hygiene prevents tooth decay and gum disease.  The frequency of eye exams is based on your age, health, family medical history, use of contact lenses, and other factors. Follow your health care provider's recommendations for frequency of eye exams.  Eat a healthy diet. Foods like vegetables, fruits, whole grains, low-fat dairy products, and lean protein foods contain the nutrients you need without too many calories. Decrease your intake of foods high in solid fats, added sugars, and salt. Eat the right amount of calories for you.Get information about a proper diet from your health care provider, if necessary.  Regular physical exercise is one of the most important things you can do for your health. Most adults should get at least 150 minutes of moderate-intensity exercise (any activity that increases your heart rate and causes you to sweat) each week. In addition, most adults need muscle-strengthening exercises on 2 or more days a week.  Maintain a healthy weight. The body mass index (BMI) is a screening tool to identify possible weight problems. It provides an estimate of body fat based on height and weight. Your health care provider can find your BMI and can help you  achieve or maintain a healthy weight.For adults 20 years and older:  A BMI below 18.5 is considered underweight.  A BMI of 18.5 to 24.9 is normal.  A BMI of 25 to 29.9 is considered overweight.  A BMI of 30 and above is considered obese.  Maintain normal blood lipids and cholesterol levels by exercising and minimizing your intake of saturated fat. Eat a balanced diet with plenty of fruit and vegetables. Blood tests for lipids and cholesterol should begin at age 80 and be repeated every 5 years. If your lipid or cholesterol levels are high, you are over 50, or you are at high risk for heart disease, you may need your cholesterol levels checked more frequently.Ongoing high lipid and cholesterol levels should be treated with medicines if diet and exercise are not working.  If you smoke, find out from your health care provider how to quit. If you do not use tobacco, do not start.  Lung cancer screening is recommended for adults aged 50-80 years who are at high risk for developing lung cancer because of a history of smoking. A yearly low-dose CT scan of the lungs is recommended for people who have at least a 30-pack-year history of smoking and are a current smoker or have quit within the past 15 years. A pack year of smoking is smoking an average of 1 pack of cigarettes a day for 1 year (for example: 1 pack a day for 30 years or 2 packs a day for 15 years). Yearly screening should continue until the smoker has stopped smoking for at least 15 years.  Yearly screening should be stopped for people who develop a health problem that would prevent them from having lung cancer treatment.  If you are pregnant, do not drink alcohol. If you are breastfeeding, be very cautious about drinking alcohol. If you are not pregnant and choose to drink alcohol, do not have more than 1 drink per day. One drink is considered to be 12 ounces (355 mL) of beer, 5 ounces (148 mL) of wine, or 1.5 ounces (44 mL) of liquor.  Avoid  use of street drugs. Do not share needles with anyone. Ask for help if you need support or instructions about stopping the use of drugs.  High blood pressure causes heart disease and increases the risk of stroke. Your blood pressure should be checked at least every 1 to 2 years. Ongoing high blood pressure should be treated with medicines if weight loss and exercise do not work.  If you are 73-87 years old, ask your health care provider if you should take aspirin to prevent strokes.  Diabetes screening is done by taking a blood sample to check your blood glucose level after you have not eaten for a certain period of time (fasting). If you are not overweight and you do not have risk factors for diabetes, you should be screened once every 3 years starting at age 30. If you are overweight or obese and you are 34-34 years of age, you should be screened for diabetes every year as part of your cardiovascular risk assessment.  Breast cancer screening is essential preventive care for women. You should practice "breast self-awareness." This means understanding the normal appearance and feel of your breasts and may include breast self-examination. Any changes detected, no matter how small, should be reported to a health care provider. Women in their 87s and 30s should have a clinical breast exam (CBE) by a health care provider as part of a regular health exam every 1 to 3 years. After age 33, women should have a CBE every year. Starting at age 63, women should consider having a mammogram (breast X-ray test) every year. Women who have a family history of breast cancer should talk to their health care provider about genetic screening. Women at a high risk of breast cancer should talk to their health care providers about having an MRI and a mammogram every year.  Breast cancer gene (BRCA)-related cancer risk assessment is recommended for women who have family members with BRCA-related cancers. BRCA-related cancers  include breast, ovarian, tubal, and peritoneal cancers. Having family members with these cancers may be associated with an increased risk for harmful changes (mutations) in the breast cancer genes BRCA1 and BRCA2. Results of the assessment will determine the need for genetic counseling and BRCA1 and BRCA2 testing.  Your health care provider may recommend that you be screened regularly for cancer of the pelvic organs (ovaries, uterus, and vagina). This screening involves a pelvic examination, including checking for microscopic changes to the surface of your cervix (Pap test). You may be encouraged to have this screening done every 3 years, beginning at age 73.  For women ages 7-65, health care providers may recommend pelvic exams and Pap testing every 3 years, or they may recommend the Pap and pelvic exam, combined with testing for human papilloma virus (HPV), every 5 years. Some types of HPV increase your risk of cervical cancer. Testing for HPV may also be done on women of any age with unclear Pap test results.  Other health care providers may not  recommend any screening for nonpregnant women who are considered low risk for pelvic cancer and who do not have symptoms. Ask your health care provider if a screening pelvic exam is right for you.  If you have had past treatment for cervical cancer or a condition that could lead to cancer, you need Pap tests and screening for cancer for at least 20 years after your treatment. If Pap tests have been discontinued, your risk factors (such as having a new sexual partner) need to be reassessed to determine if screening should resume. Some women have medical problems that increase the chance of getting cervical cancer. In these cases, your health care provider may recommend more frequent screening and Pap tests.  Colorectal cancer can be detected and often prevented. Most routine colorectal cancer screening begins at the age of 76 years and continues through age 56  years. However, your health care provider may recommend screening at an earlier age if you have risk factors for colon cancer. On a yearly basis, your health care provider may provide home test kits to check for hidden blood in the stool. Use of a small camera at the end of a tube, to directly examine the colon (sigmoidoscopy or colonoscopy), can detect the earliest forms of colorectal cancer. Talk to your health care provider about this at age 48, when routine screening begins. Direct exam of the colon should be repeated every 5-10 years through age 16 years, unless early forms of precancerous polyps or small growths are found.  People who are at an increased risk for hepatitis B should be screened for this virus. You are considered at high risk for hepatitis B if:  You were born in a country where hepatitis B occurs often. Talk with your health care provider about which countries are considered high risk.  Your parents were born in a high-risk country and you have not received a shot to protect against hepatitis B (hepatitis B vaccine).  You have HIV or AIDS.  You use needles to inject street drugs.  You live with, or have sex with, someone who has hepatitis B.  You get hemodialysis treatment.  You take certain medicines for conditions like cancer, organ transplantation, and autoimmune conditions.  Hepatitis C blood testing is recommended for all people born from [birth year] through 1965 and any individual with known risks for hepatitis C.  Practice safe sex. Use condoms and avoid high-risk sexual practices to reduce the spread of sexually transmitted infections (STIs). STIs include gonorrhea, chlamydia, syphilis, trichomonas, herpes, HPV, and human immunodeficiency virus (HIV). Herpes, HIV, and HPV are viral illnesses that have no cure. They can result in disability, cancer, and death.  You should be screened for sexually transmitted illnesses (STIs) including gonorrhea and chlamydia if:  You  are sexually active and are younger than 24 years.  You are older than 24 years and your health care provider tells you that you are at risk for this type of infection.  Your sexual activity has changed since you were last screened and you are at an increased risk for chlamydia or gonorrhea. Ask your health care provider if you are at risk.  If you are at risk of being infected with HIV, it is recommended that you take a prescription medicine daily to prevent HIV infection. This is called preexposure prophylaxis (PrEP). You are considered at risk if:  You are sexually active and do not regularly use condoms or know the HIV status of your partner(s).  You take drugs  by injection.  You are sexually active with a partner who has HIV.  Talk with your health care provider about whether you are at high risk of being infected with HIV. If you choose to begin PrEP, you should first be tested for HIV. You should then be tested every 3 months for as long as you are taking PrEP.  Osteoporosis is a disease in which the bones lose minerals and strength with aging. This can result in serious bone fractures or breaks. The risk of osteoporosis can be identified using a bone density scan. Women ages 19 years and over and women at risk for fractures or osteoporosis should discuss screening with their health care providers. Ask your health care provider whether you should take a calcium supplement or vitamin D to reduce the rate of osteoporosis.  Menopause can be associated with physical symptoms and risks. Hormone replacement therapy is available to decrease symptoms and risks. You should talk to your health care provider about whether hormone replacement therapy is right for you.  Use sunscreen. Apply sunscreen liberally and repeatedly throughout the day. You should seek shade when your shadow is shorter than you. Protect yourself by wearing long sleeves, pants, a wide-brimmed hat, and sunglasses year round,  whenever you are outdoors.  Once a month, do a whole body skin exam, using a mirror to look at the skin on your back. Tell your health care provider of new moles, moles that have irregular borders, moles that are larger than a pencil eraser, or moles that have changed in shape or color.  Stay current with required vaccines (immunizations).  Influenza vaccine. All adults should be immunized every year.  Tetanus, diphtheria, and acellular pertussis (Td, Tdap) vaccine. Pregnant women should receive 1 dose of Tdap vaccine during each pregnancy. The dose should be obtained regardless of the length of time since the last dose. Immunization is preferred during the 27th-36th week of gestation. An adult who has not previously received Tdap or who does not know her vaccine status should receive 1 dose of Tdap. This initial dose should be followed by tetanus and diphtheria toxoids (Td) booster doses every 10 years. Adults with an unknown or incomplete history of completing a 3-dose immunization series with Td-containing vaccines should begin or complete a primary immunization series including a Tdap dose. Adults should receive a Td booster every 10 years.  Varicella vaccine. An adult without evidence of immunity to varicella should receive 2 doses or a second dose if she has previously received 1 dose. Pregnant females who do not have evidence of immunity should receive the first dose after pregnancy. This first dose should be obtained before leaving the health care facility. The second dose should be obtained 4-8 weeks after the first dose.  Human papillomavirus (HPV) vaccine. Females aged 13-26 years who have not received the vaccine previously should obtain the 3-dose series. The vaccine is not recommended for use in pregnant females. However, pregnancy testing is not needed before receiving a dose. If a female is found to be pregnant after receiving a dose, no treatment is needed. In that case, the remaining  doses should be delayed until after the pregnancy. Immunization is recommended for any person with an immunocompromised condition through the age of 73 years if she did not get any or all doses earlier. During the 3-dose series, the second dose should be obtained 4-8 weeks after the first dose. The third dose should be obtained 24 weeks after the first dose and 16  weeks after the second dose.  Zoster vaccine. One dose is recommended for adults aged 47 years or older unless certain conditions are present.  Measles, mumps, and rubella (MMR) vaccine. Adults born before [birth year] generally are considered immune to measles and mumps. Adults born in [birth year] or later should have 1 or more doses of MMR vaccine unless there is a contraindication to the vaccine or there is laboratory evidence of immunity to each of the three diseases. A routine second dose of MMR vaccine should be obtained at least 28 days after the first dose for students attending postsecondary schools, health care workers, or international travelers. People who received inactivated measles vaccine or an unknown type of measles vaccine during 1963-1967 should receive 2 doses of MMR vaccine. People who received inactivated mumps vaccine or an unknown type of mumps vaccine before 1979 and are at high risk for mumps infection should consider immunization with 2 doses of MMR vaccine. For females of childbearing age, rubella immunity should be determined. If there is no evidence of immunity, females who are not pregnant should be vaccinated. If there is no evidence of immunity, females who are pregnant should delay immunization until after pregnancy. Unvaccinated health care workers born before [birth year] who lack laboratory evidence of measles, mumps, or rubella immunity or laboratory confirmation of disease should consider measles and mumps immunization with 2 doses of MMR vaccine or rubella immunization with 1 dose of MMR vaccine.  Pneumococcal 13-valent conjugate  (PCV13) vaccine. When indicated, a person who is uncertain of his immunization history and has no record of immunization should receive the PCV13 vaccine. All adults 48 years of age and older should receive this vaccine. An adult aged 24 years or older who has certain medical conditions and has not been previously immunized should receive 1 dose of PCV13 vaccine. This PCV13 should be followed with a dose of pneumococcal polysaccharide (PPSV23) vaccine. Adults who are at high risk for pneumococcal disease should obtain the PPSV23 vaccine at least 8 weeks after the dose of PCV13 vaccine. Adults older than 70 years of age who have normal immune system function should obtain the PPSV23 vaccine dose at least 1 year after the dose of PCV13 vaccine.  Pneumococcal polysaccharide (PPSV23) vaccine. When PCV13 is also indicated, PCV13 should be obtained first. All adults aged 88 years and older should be immunized. An adult younger than age 44 years who has certain medical conditions should be immunized. Any person who resides in a nursing home or long-term care facility should be immunized. An adult smoker should be immunized. People with an immunocompromised condition and certain other conditions should receive both PCV13 and PPSV23 vaccines. People with human immunodeficiency virus (HIV) infection should be immunized as soon as possible after diagnosis. Immunization during chemotherapy or radiation therapy should be avoided. Routine use of PPSV23 vaccine is not recommended for American Indians, [place] Natives, or people younger than 65 years unless there are medical conditions that require PPSV23 vaccine. When indicated, people who have unknown immunization and have no record of immunization should receive PPSV23 vaccine. One-time revaccination 5 years after the first dose of PPSV23 is recommended for people aged 19-64 years who have chronic kidney failure, nephrotic syndrome, asplenia, or immunocompromised conditions.  People who received 1-2 doses of PPSV23 before age 6 years should receive another dose of PPSV23 vaccine at age 9 years or later if at least 5 years have passed since the previous dose. Doses of PPSV23 are not needed for people immunized with  PPSV23 at or after age 43 years.  Meningococcal vaccine. Adults with asplenia or persistent complement component deficiencies should receive 2 doses of quadrivalent meningococcal conjugate (MenACWY-D) vaccine. The doses should be obtained at least 2 months apart. Microbiologists working with certain meningococcal bacteria, military recruits, people at risk during an outbreak, and people who travel to or live in countries with a high rate of meningitis should be immunized. A first-year college student up through age 44 years who is living in a residence hall should receive a dose if she did not receive a dose on or after her 16th birthday. Adults who have certain high-risk conditions should receive one or more doses of vaccine.  Hepatitis A vaccine. Adults who wish to be protected from this disease, have certain high-risk conditions, work with hepatitis A-infected animals, work in hepatitis A research labs, or travel to or work in countries with a high rate of hepatitis A should be immunized. Adults who were previously unvaccinated and who anticipate close contact with an international adoptee during the first 60 days after arrival in the [place] from a country with a high rate of hepatitis A should be immunized.  Hepatitis B vaccine. Adults who wish to be protected from this disease, have certain high-risk conditions, may be exposed to blood or other infectious body fluids, are household contacts or sex partners of hepatitis B positive people, are clients or workers in certain care facilities, or travel to or work in countries with a high rate of hepatitis B should be immunized.  Haemophilus influenzae type b (Hib) vaccine. A previously unvaccinated person with  asplenia or sickle cell disease or having a scheduled splenectomy should receive 1 dose of Hib vaccine. Regardless of previous immunization, a recipient of a hematopoietic stem cell transplant should receive a 3-dose series 6-12 months after her successful transplant. Hib vaccine is not recommended for adults with HIV infection. Preventive Services / Frequency Ages 34 to 39 years  Blood pressure check.** / Every 3-5 years.  Lipid and cholesterol check.** / Every 5 years beginning at age 67.  Clinical breast exam.** / Every 3 years for women in their 77s and 30s.  BRCA-related cancer risk assessment.** / For women who have family members with a BRCA-related cancer (breast, ovarian, tubal, or peritoneal cancers).  Pap test.** / Every 2 years from ages 91 through 67. Every 3 years starting at age 67 through age 68 or 21 with a history of 3 consecutive normal Pap tests.  HPV screening.** / Every 3 years from ages 63 through ages 72 to 11 with a history of 3 consecutive normal Pap tests.  Hepatitis C blood test.** / For any individual with known risks for hepatitis C.  Skin self-exam. / Monthly.  Influenza vaccine. / Every year.  Tetanus, diphtheria, and acellular pertussis (Tdap, Td) vaccine.** / Consult your health care provider. Pregnant women should receive 1 dose of Tdap vaccine during each pregnancy. 1 dose of Td every 10 years.  Varicella vaccine.** / Consult your health care provider. Pregnant females who do not have evidence of immunity should receive the first dose after pregnancy.  HPV vaccine. / 3 doses over 6 months, if 26 and younger. The vaccine is not recommended for use in pregnant females. However, pregnancy testing is not needed before receiving a dose.  Measles, mumps, rubella (MMR) vaccine.** / You need at least 1 dose of MMR if you were born in 1957 or later. You may also need a 2nd dose. For females  of childbearing age, rubella immunity should be determined. If there is  no evidence of immunity, females who are not pregnant should be vaccinated. If there is no evidence of immunity, females who are pregnant should delay immunization until after pregnancy.  Pneumococcal 13-valent conjugate (PCV13) vaccine.** / Consult your health care provider.  Pneumococcal polysaccharide (PPSV23) vaccine.** / 1 to 2 doses if you smoke cigarettes or if you have certain conditions.  Meningococcal vaccine.** / 1 dose if you are age 64 to 34 years and a Market researcher living in a residence hall, or have one of several medical conditions, you need to get vaccinated against meningococcal disease. You may also need additional booster doses.  Hepatitis A vaccine.** / Consult your health care provider.  Hepatitis B vaccine.** / Consult your health care provider.  Haemophilus influenzae type b (Hib) vaccine.** / Consult your health care provider. Ages 69 to 85 years  Blood pressure check.** / Every year.  Lipid and cholesterol check.** / Every 5 years beginning at age 78 years.  Lung cancer screening. / Every year if you are aged 16-80 years and have a 30-pack-year history of smoking and currently smoke or have quit within the past 15 years. Yearly screening is stopped once you have quit smoking for at least 15 years or develop a health problem that would prevent you from having lung cancer treatment.  Clinical breast exam.** / Every year after age 73 years.  BRCA-related cancer risk assessment.** / For women who have family members with a BRCA-related cancer (breast, ovarian, tubal, or peritoneal cancers).  Mammogram.** / Every year beginning at age 82 years and continuing for as long as you are in good health. Consult with your health care provider.  Pap test.** / Every 3 years starting at age 72 years through age 80 or 17 years with a history of 3 consecutive normal Pap tests.  HPV screening.** / Every 3 years from ages 93 years through ages 21 to 72 years with a  history of 3 consecutive normal Pap tests.  Fecal occult blood test (FOBT) of stool. / Every year beginning at age 69 years and continuing until age 79 years. You may not need to do this test if you get a colonoscopy every 10 years.  Flexible sigmoidoscopy or colonoscopy.** / Every 5 years for a flexible sigmoidoscopy or every 10 years for a colonoscopy beginning at age 54 years and continuing until age 59 years.  Hepatitis C blood test.** / For all people born from [birth year] through 1965 and any individual with known risks for hepatitis C.  Skin self-exam. / Monthly.  Influenza vaccine. / Every year.  Tetanus, diphtheria, and acellular pertussis (Tdap/Td) vaccine.** / Consult your health care provider. Pregnant women should receive 1 dose of Tdap vaccine during each pregnancy. 1 dose of Td every 10 years.  Varicella vaccine.** / Consult your health care provider. Pregnant females who do not have evidence of immunity should receive the first dose after pregnancy.  Zoster vaccine.** / 1 dose for adults aged 46 years or older.  Measles, mumps, rubella (MMR) vaccine.** / You need at least 1 dose of MMR if you were born in 1957 or later. You may also need a second dose. For females of childbearing age, rubella immunity should be determined. If there is no evidence of immunity, females who are not pregnant should be vaccinated. If there is no evidence of immunity, females who are pregnant should delay immunization until after pregnancy.  Pneumococcal 13-valent  conjugate (PCV13) vaccine.** / Consult your health care provider.  Pneumococcal polysaccharide (PPSV23) vaccine.** / 1 to 2 doses if you smoke cigarettes or if you have certain conditions.  Meningococcal vaccine.** / Consult your health care provider.  Hepatitis A vaccine.** / Consult your health care provider.  Hepatitis B vaccine.** / Consult your health care provider.  Haemophilus influenzae type b (Hib) vaccine.** / Consult your health  care provider. Ages 56 years and over  Blood pressure check.** / Every year.  Lipid and cholesterol check.** / Every 5 years beginning at age 4 years.  Lung cancer screening. / Every year if you are aged 22-80 years and have a 30-pack-year history of smoking and currently smoke or have quit within the past 15 years. Yearly screening is stopped once you have quit smoking for at least 15 years or develop a health problem that would prevent you from having lung cancer treatment.  Clinical breast exam.** / Every year after age 62 years.  BRCA-related cancer risk assessment.** / For women who have family members with a BRCA-related cancer (breast, ovarian, tubal, or peritoneal cancers).  Mammogram.** / Every year beginning at age 31 years and continuing for as long as you are in good health. Consult with your health care provider.  Pap test.** / Every 3 years starting at age 50 years through age 40 or 35 years with 3 consecutive normal Pap tests. Testing can be stopped between 65 and 70 years with 3 consecutive normal Pap tests and no abnormal Pap or HPV tests in the past 10 years.  HPV screening.** / Every 3 years from ages 57 years through ages 37 or 53 years with a history of 3 consecutive normal Pap tests. Testing can be stopped between 65 and 70 years with 3 consecutive normal Pap tests and no abnormal Pap or HPV tests in the past 10 years.  Fecal occult blood test (FOBT) of stool. / Every year beginning at age 68 years and continuing until age 58 years. You may not need to do this test if you get a colonoscopy every 10 years.  Flexible sigmoidoscopy or colonoscopy.** / Every 5 years for a flexible sigmoidoscopy or every 10 years for a colonoscopy beginning at age 75 years and continuing until age 29 years.  Hepatitis C blood test.** / For all people born from [birth year] through 1965 and any individual with known risks for hepatitis C.  Osteoporosis screening.** / A one-time screening for women  ages 62 years and over and women at risk for fractures or osteoporosis.  Skin self-exam. / Monthly.  Influenza vaccine. / Every year.  Tetanus, diphtheria, and acellular pertussis (Tdap/Td) vaccine.** / 1 dose of Td every 10 years.  Varicella vaccine.** / Consult your health care provider.  Zoster vaccine.** / 1 dose for adults aged 58 years or older.  Pneumococcal 13-valent conjugate (PCV13) vaccine.** / Consult your health care provider.  Pneumococcal polysaccharide (PPSV23) vaccine.** / 1 dose for all adults aged 73 years and older.  Meningococcal vaccine.** / Consult your health care provider.  Hepatitis A vaccine.** / Consult your health care provider.  Hepatitis B vaccine.** / Consult your health care provider.  Haemophilus influenzae type b (Hib) vaccine.** / Consult your health care provider. ** Family history and personal history of risk and conditions may change your health care provider's recommendations.   This information is not intended to replace advice given to you by your health care provider. Make sure you discuss any questions you have with your  health care provider.   Document Released: 10/05/2001 Document Revised: 08/30/2014 Document Reviewed: 01/04/2011 Elsevier Interactive Patient Education [employer].

## 2016-02-08 DIAGNOSIS — Z8739 Personal history of other diseases of the musculoskeletal system and connective tissue: Secondary | ICD-10-CM

## 2016-02-08 HISTORY — DX: Personal history of other diseases of the musculoskeletal system and connective tissue: Z87.39

## 2016-02-08 NOTE — Assessment & Plan Note (Signed)
Patient encouraged to maintain heart healthy diet, regular exercise, adequate sleep. Consider daily probiotics. Take medications as prescribed. Labs reviewed. Given and reviewed copy of ACP documents from [place] Secretary of State and encouraged to complete and return 

## 2016-02-08 NOTE — Assessment & Plan Note (Signed)
Encouraged to get adequate exercise, calcium and vitamin d intake 

## 2016-02-08 NOTE — Progress Notes (Signed)
Patient ID: Renee [name], female   DOB: 1946/01/16, 70 y.o.   MRN: [id number]   Subjective:    Patient ID: Renee [name], female    DOB: 02-12-1946, 70 y.o.   MRN: [id number]  Chief Complaint  Patient presents with  . Annual Exam    HPI Patient is in today for annual exam and follow up. She is struggling with some nasal congestion with PND over the past few days. No fevers or chills. Has not smoked a cigarette since September 01, 2015. Denies CP/palp/SOB/HA/fevers/GI or GU c/o. Taking meds as prescribed. She is doing well with ADLs and stays active. Is eating regularly, trying to maintain a balanced diet  Past Medical History  Diagnosis Date  . Hyperlipidemia   . Tobacco abuse   . Tremor, essential   . Hypertension   . Allergic rhinitis   . 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 Preventative health care 05/12/2013    Past Surgical History  Procedure Laterality Date  . Mouth surgery  01/22/11    lower denture secured by titanium bolts  . Inner ear surgery  2015    with tube    Family History  Problem Relation Age of Onset  . Colon cancer Neg Hx   . Breast cancer Neg Hx   . Prostate cancer Neg Hx   . Diabetes Neg Hx   . Heart attack Father 84    deceased  . Stroke Mother   . Hypertension Mother   . Alcohol abuse Mother   . Obesity Daughter   . Heart disease Paternal Grandmother     AAA rupture  . Heart disease Paternal Grandfather     MI    Social History   Social History  . Marital Status: Married    Spouse Name: N/A  . Number of Children: N/A  . Years of Education: N/A   Occupational History  . Not on file.   Social History Main Topics  . Smoking status: Current Every Day Smoker -- 0.50 packs/day    Types: Cigarettes  . Smokeless tobacco: Never Used     Comment: Has not smoked a cigarette in 6 weeks.    . Alcohol  Use: 8.4 oz/week    14 Glasses of wine per week  . Drug Use: No  . Sexual Activity: Not on file   Other Topics Concern  . Not on file   Social History Narrative    Outpatient Prescriptions Prior to Visit  Medication Sig Dispense Refill  . b complex vitamins tablet Take 1 tablet by mouth every other day.    . Multiple Vitamin (MULTIVITAMIN) tablet Take 1 tablet by mouth daily.    Marland Kitchen atorvastatin (LIPITOR) 10 MG tablet Take 1 tablet (10 mg total) by mouth daily. (Patient not taking: Reported on 09/01/2015) 30 tablet 3   No facility-administered medications prior to visit.    Allergies  Allergen Reactions  . Amoxicillin     REACTION: TROUBLE BREATHING    Review of Systems  Constitutional: Negative for fever, chills and malaise/fatigue.  HENT: Positive for congestion. Negative for hearing loss.   Eyes: Negative for discharge.  Respiratory: Negative for cough, sputum production and shortness of breath.   Cardiovascular: Negative for chest pain, palpitations and leg swelling.  Gastrointestinal: Negative for heartburn,  nausea, vomiting, abdominal pain, diarrhea, constipation and blood in stool.  Genitourinary: Negative for dysuria, urgency, frequency and hematuria.  Musculoskeletal: Negative for myalgias, back pain and falls.  Skin: Negative for rash.  Neurological: Negative for dizziness, sensory change, loss of consciousness, weakness and headaches.  Endo/Heme/Allergies: Negative for environmental allergies. Does not bruise/bleed easily.  Psychiatric/Behavioral: Negative for depression and suicidal ideas. The patient is not nervous/anxious and does not have insomnia.        Objective:    Physical Exam  Constitutional: She is oriented to person, place, and time. She appears well-developed and well-nourished. No distress.  HENT:  Head: Normocephalic and atraumatic.  Eyes: Conjunctivae are normal.  Neck: Neck supple. No thyromegaly present.  Cardiovascular: Normal rate, regular  rhythm and normal heart sounds.   No murmur heard. Pulmonary/Chest: Effort normal and breath sounds normal. No respiratory distress.  Abdominal: Soft. Bowel sounds are normal. She exhibits no distension and no mass. There is no tenderness.  Musculoskeletal: She exhibits no edema.  Lymphadenopathy:    She has no cervical adenopathy.  Neurological: She is alert and oriented to person, place, and time.  Skin: Skin is warm and dry.  Psychiatric: She has a normal mood and affect. Her behavior is normal.    BP 138/88 mmHg  Pulse 90  Temp(Src) 98.6 F (37 C) (Oral)  Ht 5' 6"  (1.676 m)  Wt 164 lb 6 oz (74.56 kg)  BMI 26.54 kg/m2  SpO2 97% Wt Readings from Last 3 Encounters:  01/29/16 164 lb 6 oz (74.56 kg)  10/07/15 150 lb 6.4 oz (68.221 kg)  10/03/15 153 lb (69.4 kg)     Lab Results  Component Value Date   WBC 9.6 05/08/2013   HGB 16.1* 05/08/2013   HCT 47.9* 05/08/2013   PLT 307 05/08/2013   GLUCOSE 106* 05/01/2013   CHOL 287* 05/01/2013   TRIG 106 05/01/2013   HDL 78 05/01/2013   LDLDIRECT 148.6 09/26/2008   LDLCALC 188* 05/01/2013   ALT 15 05/01/2013   AST 16 05/01/2013   NA 136 05/01/2013   K 4.6 05/01/2013   CL 102 05/01/2013   CREATININE 0.69 05/01/2013   BUN 16 05/01/2013   CO2 25 05/01/2013   TSH 1.554 05/01/2013   HGBA1C 5.6 05/08/2013    Lab Results  Component Value Date   TSH 1.554 05/01/2013   Lab Results  Component Value Date   WBC 9.6 05/08/2013   HGB 16.1* 05/08/2013   HCT 47.9* 05/08/2013   MCV 101.5* 05/08/2013   PLT 307 05/08/2013   Lab Results  Component Value Date   NA 136 05/01/2013   K 4.6 05/01/2013   CO2 25 05/01/2013   GLUCOSE 106* 05/01/2013   BUN 16 05/01/2013   CREATININE 0.69 05/01/2013   BILITOT 0.8 05/01/2013   ALKPHOS 81 05/01/2013   AST 16 05/01/2013   ALT 15 05/01/2013   PROT 6.8 05/01/2013   ALBUMIN 4.3 05/01/2013   ALBUMIN 4.3 05/01/2013   CALCIUM 9.5 05/01/2013   Lab Results  Component Value Date   CHOL  287* 05/01/2013   Lab Results  Component Value Date   HDL 78 05/01/2013   Lab Results  Component Value Date   LDLCALC 188* 05/01/2013   Lab Results  Component Value Date   TRIG 106 05/01/2013   Lab Results  Component Value Date   CHOLHDL 3.7 05/01/2013   Lab Results  Component Value Date   HGBA1C 5.6 05/08/2013  Assessment & Plan:   Problem List Items Addressed This Visit    Preventative health care    Patient encouraged to maintain heart healthy diet, regular exercise, adequate sleep. Consider daily probiotics. Take medications as prescribed. Labs reviewed. Given and reviewed copy of ACP documents from [employer] and encouraged to complete and return      Hyperlipidemia, mixed    Tolerating statin, encouraged heart healthy diet, avoid trans fats, minimize simple carbs and saturated fats. Increase exercise as tolerated      Relevant Orders   DG Bone Density   Lipid panel   Comp Met (CMET)   CBC   TSH   Vitamin D (25 hydroxy)   H/O osteopenia    Encouraged to get adequate exercise, calcium and vitamin d intake      Relevant Orders   DG Bone Density   Lipid panel   Comp Met (CMET)   CBC   TSH   Vitamin D (25 hydroxy)   Essential hypertension    Well controlled, no changes to meds. Encouraged heart healthy diet such as the DASH diet and exercise as tolerated.       Relevant Orders   DG Bone Density   Lipid panel   Comp Met (CMET)   CBC   TSH   Vitamin D (25 hydroxy)   Allergic rhinitis    Cetirizine and Flonase prn       Other Visit Diagnoses    Postmenopausal estrogen deficiency    -  Primary    Relevant Orders    DG Bone Density    Lipid panel    Comp Met (CMET)    CBC    TSH    Vitamin D (25 hydroxy)       I have discontinued Ms. Reinitz's atorvastatin. I am also having her maintain her multivitamin and b complex vitamins.  No orders of the defined types were placed in this encounter.     Penni Homans, MD

## 2016-02-08 NOTE — Assessment & Plan Note (Signed)
Cetirizine and Flonase prn

## 2016-02-08 NOTE — Assessment & Plan Note (Signed)
Well controlled, no changes to meds. Encouraged heart healthy diet such as the DASH diet and exercise as tolerated.  °

## 2016-02-08 NOTE — Assessment & Plan Note (Signed)
Tolerating statin, encouraged heart healthy diet, avoid trans fats, minimize simple carbs and saturated fats. Increase exercise as tolerated 

## 2016-02-11 ENCOUNTER — Encounter: Payer: Self-pay | Admitting: Physician Assistant

## 2016-02-11 ENCOUNTER — Ambulatory Visit (INDEPENDENT_AMBULATORY_CARE_PROVIDER_SITE_OTHER): Payer: Medicare Other | Admitting: Physician Assistant

## 2016-02-11 VITALS — BP 152/70 | HR 89 | Temp 98.1°F | Resp 16 | Ht 66.0 in

## 2016-02-11 DIAGNOSIS — J069 Acute upper respiratory infection, unspecified: Secondary | ICD-10-CM | POA: Diagnosis not present

## 2016-02-11 DIAGNOSIS — B9789 Other viral agents as the cause of diseases classified elsewhere: Principal | ICD-10-CM

## 2016-02-11 MED ORDER — AZITHROMYCIN 250 MG PO TABS: ORAL_TABLET | ORAL | Status: DC

## 2016-02-11 NOTE — Patient Instructions (Signed)
Please stay well hydrated and rest when possible. Use saline nasal spray to flush out nasal passages.  Plain Mucinex to thin out mucous.  Symptoms seem consistent with a viral URI, especially since exam looks good and you are already feeling better. If symptoms take a turn in the opposite direction, please fill the written script for the antibiotic. I am doing this since you will be out of town and will have limited access to [place].

## 2016-02-11 NOTE — Progress Notes (Signed)
Pre visit review using our clinic review tool, if applicable. No additional management support is needed unless otherwise documented below in the visit note/SLS  

## 2016-02-11 NOTE — Progress Notes (Signed)
.   Patient presents to clinic today c/o 4 days of sinus congestion, cough that has been mainly dry and infrequent, and R ear pain. Patient endorses symptoms are improving but she has history of AOM and Cholesteatoma of R ear. Has t-tube at present in the R ear. Wants assessment before leaving for [place]. Has a sick loved one she will be taking care of.  Past Medical History  Diagnosis Date  . Hyperlipidemia   . Tobacco abuse   . Tremor, essential   . Hypertension   . Allergic rhinitis   . 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 Preventative health care 05/12/2013    Current Outpatient Prescriptions on File Prior to Visit  Medication Sig Dispense Refill  . b complex vitamins tablet Take 1 tablet by mouth every other day.    . Multiple Vitamin (MULTIVITAMIN) tablet Take 1 tablet by mouth daily.     No current facility-administered medications on file prior to visit.    Allergies  Allergen Reactions  . Amoxicillin     REACTION: TROUBLE BREATHING  . Proair Hfa [Albuterol] Other (See Comments)    Hypersensitivity    Family History  Problem Relation Age of Onset  . Colon cancer Neg Hx   . Breast cancer Neg Hx   . Prostate cancer Neg Hx   . Diabetes Neg Hx   . Heart attack Father 44    deceased  . Stroke Mother   . Hypertension Mother   . Alcohol abuse Mother   . Obesity Daughter   . Heart disease Paternal Grandmother     AAA rupture  . Heart disease Paternal Grandfather     MI    Social History   Social History  . Marital Status: Married    Spouse Name: N/A  . Number of Children: N/A  . Years of Education: N/A   Social History Main Topics  . Smoking status: Former Smoker -- 0.50 packs/day    Types: Cigarettes  . Smokeless tobacco: Never Used     Comment: Has not smoked a cigarette in 6 weeks.    . Alcohol Use: 8.4 oz/week      14 Glasses of wine per week  . Drug Use: No  . Sexual Activity: Not Asked   Other Topics Concern  . None   Social History Narrative    Review of Systems - See HPI.  All other ROS are negative.  BP 152/70 mmHg  Pulse 89  Temp(Src) 98.1 F (36.7 C) (Oral)  Resp 16  Ht 5\' 6"  (1.676 m)  SpO2 97%  Physical Exam  Constitutional: She is oriented to person, place, and time and well-developed, well-nourished, and in no distress.  HENT:  Head: Normocephalic and atraumatic.  Right Ear: Tympanic membrane, external ear and ear canal normal.  Left Ear: Tympanic membrane, external ear and ear canal normal.  Ears:  Nose: Nose normal. Right sinus exhibits no maxillary sinus tenderness and no frontal sinus tenderness. Left sinus exhibits no maxillary sinus tenderness and no frontal sinus tenderness.  Mouth/Throat: Uvula is midline, oropharynx is clear and moist and mucous membranes are normal.  Eyes: Conjunctivae are normal.  Neck: Neck supple.  Cardiovascular: Normal rate, regular rhythm, normal heart sounds and intact distal pulses.   Pulmonary/Chest: Effort normal and breath  sounds normal. No respiratory distress. She has no wheezes. She has no rales. She exhibits no tenderness.  Neurological: She is alert and oriented to person, place, and time.  Skin: Skin is warm and dry. No rash noted.  Psychiatric: Affect normal.  Vitals reviewed.   No results found for this or any previous visit (from the past 2160 hour(s)).  Assessment/Plan: 1. Viral URI with cough Symptoms improving. Exam within normal limits. Tubes in place without obstruction. No sign of otitis media. Supportive measures and OTC regimen reviewed. Giving she will be leaving town, will print Rx for Azithromycin. She is to take only if symptoms revert and acutely worsen.   [name], PA-C

## 2016-02-17 ENCOUNTER — Telehealth: Payer: Self-pay | Admitting: *Deleted

## 2016-02-17 NOTE — Telephone Encounter (Signed)
[facility] note received via fax  Call:   Date: 02/16/16 Time: 1636   Caller: Self Return number: [phone]  Nurse: Lin Givens, RN  Chief Complaint: Ear Fullness or Congestion  Reason for call: Caller states she's been doing better. Situation: She saw MD on Wed and gave Rx (azithromycin) but only refill as needed. She filled it on Sat. Now, her glands are swollen and ears are plugged. Is that part of her illness verses side-effect from antibiotic.  Related visit to physician within the last 2 weeks: Yes  Guideline: Earache (exceptions: brief ear pain of <60 minutes duration, earache occurring during air travel)  Disposition:  See Physicians within 24 Hours  **Pt has appt w/ Dr. Lorelei Pont 02/18/2016**

## 2016-02-18 ENCOUNTER — Encounter: Payer: Self-pay | Admitting: Family Medicine

## 2016-02-18 ENCOUNTER — Ambulatory Visit (INDEPENDENT_AMBULATORY_CARE_PROVIDER_SITE_OTHER): Payer: Medicare Other | Admitting: Family Medicine

## 2016-02-18 VITALS — BP 149/87 | HR 76 | Temp 98.6°F | Ht 66.0 in | Wt 163.2 lb

## 2016-02-18 DIAGNOSIS — J029 Acute pharyngitis, unspecified: Secondary | ICD-10-CM

## 2016-02-18 LAB — POCT RAPID STREP A (OFFICE): Rapid Strep A Screen: NEGATIVE

## 2016-02-18 NOTE — Patient Instructions (Signed)
Your strep is negative. I expect that you will continue to get better- let me know if you do not!

## 2016-02-18 NOTE — Progress Notes (Signed)
Pre visit review using our clinic review tool, if applicable. No additional management support is needed unless otherwise documented below in the visit note. 

## 2016-02-18 NOTE — Progress Notes (Signed)
[place] at [facility] [address] [place], [place] [address] [phone]  Date:  02/18/2016   Name:  Renee [name]   DOB:  [birth year]   MRN:  [id number]  PCP:  Penni Homans, MD    Chief Complaint: Ear pressure   History of Present Illness:  Renee [name] is a 70 y.o. very pleasant female patient who presents with the following:  She was here on 6/21 with complaint of sinus congestion and right ear pain. Given rx for azithromcyin to hold- she did end up filling this and started it on this past Saturday (today is Wednesday). She will finish this today  She notes that she still has a ST but improved, her ears were hurting but this is now better.    Both her ears and throat are improved.   She has not noted any fever. No body aches or chills.  Her lungs are clear and normal.   She did smoke in the past but quit.   She is planning to travel to [place] this weekend and wanted to make sure that she was ok  She has had surgery on her right ear and has a tube in this ear  Patient Active Problem List   Diagnosis Date Noted  . H/O osteopenia 02/08/2016  . Cervical pain (neck) 10/07/2015  . 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 Preventative health care 05/12/2013  . Cervical cancer screening 05/08/2013  . Otitis media 02/23/2012  . Bronchitis 11/23/2011  . ABNORMAL INVOLUNTARY MOVEMENTS 01/01/2009  . Hyperlipidemia, mixed 10/03/2008  . TOBACCO ABUSE 09/19/2008  . TREMOR, ESSENTIAL 09/19/2008  . 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 Essential hypertension 09/19/2008  . Allergic rhinitis 09/19/2008  . CHEST PAIN 09/19/2008    Past Medical History  Diagnosis Date  . Hyperlipidemia   . Tobacco abuse   . Tremor, essential   . Hypertension   . Allergic rhinitis   . 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 Preventative health care 05/12/2013    Past Surgical History  Procedure Laterality Date  . Mouth surgery  01/22/11    lower denture secured by titanium bolts  . Inner ear surgery  2015    with tube    Social History  Substance Use Topics  . Smoking status: Former Smoker -- 0.50 packs/day    Types: Cigarettes  . Smokeless tobacco: Never Used     Comment: Has not smoked a cigarette in 6 weeks.    . Alcohol Use: 8.4 oz/week    14 Glasses of wine per week    Family History  Problem Relation Age of Onset  . Colon cancer Neg Hx   . Breast cancer Neg Hx   . Prostate cancer Neg Hx   . Diabetes Neg Hx   . Heart attack Father 74    deceased  . Stroke Mother   . Hypertension Mother   . Alcohol abuse Mother   . Obesity Daughter   . Heart disease Paternal Grandmother     AAA rupture  . Heart disease Paternal Grandfather     MI    Allergies  Allergen Reactions  . Amoxicillin     REACTION: TROUBLE BREATHING  . Proair Hfa [Albuterol]  Other (See Comments)    Hypersensitivity    Medication list has been reviewed and updated.  Current Outpatient Prescriptions on File Prior to Visit  Medication Sig Dispense Refill  . Ascorbic Acid (VITAMIN C) 100 MG tablet Take 100 mg by mouth daily.    Marland Kitchen azithromycin (ZITHROMAX) 250 MG tablet Take 2 tablets on Day. Then take 1 tablet daily. 6 tablet 0  . b complex vitamins tablet Take 1 tablet by mouth every other day.    . Multiple Vitamin (MULTIVITAMIN) tablet Take 1 tablet by mouth daily.     No current facility-administered medications on file prior to visit.    Review of Systems:  As per HPI- otherwise negative.   Physical Examination: Filed Vitals:   02/18/16 1103  BP: 142/93  Pulse: 76  Temp: 98.6 F (37 C)   Filed Vitals:   02/18/16 1103  Height: 5\' 6"  (1.676 m)  Weight: 163 lb 3.2 oz (74.027 kg)   Body mass index is 26.35 kg/(m^2). 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 Neck supple. No masses, No LAD.  Bilateral TM wnl- tube present in right, oropharynx slightly inflamed, no exudate.  PEERL,EOMI.   Ears and Nose: No external deformity. CV: RRR, No M/G/R. No JVD. No thrill. No extra heart sounds. PULM: CTA B, no wheezes, crackles, rhonchi. No retractions. No resp. distress. No accessory muscle use. EXTR: No c/c/e NEURO Normal gait.  PSYCH: Normally interactive. Conversant. Not depressed or anxious appearing.  Calm demeanor.   Results for orders placed or performed in visit on 02/18/16  POCT rapid strep A  Result Value Ref Range   Rapid Strep A Screen Negative Negative    Assessment and Plan: Acute pharyngitis, unspecified etiology - Plan: POCT rapid strep A  Reassured that she does not have strep and that it sounds like she is on the path to getting better. She will let me know if she does not continue to improve  Signed Lamar Blinks, MD

## 2016-05-04 ENCOUNTER — Other Ambulatory Visit: Payer: Self-pay

## 2016-05-11 ENCOUNTER — Ambulatory Visit: Payer: Self-pay | Admitting: Family Medicine

## 2016-07-01 ENCOUNTER — Other Ambulatory Visit (INDEPENDENT_AMBULATORY_CARE_PROVIDER_SITE_OTHER): Payer: Medicare Other

## 2016-07-01 DIAGNOSIS — E782 Mixed hyperlipidemia: Secondary | ICD-10-CM | POA: Diagnosis not present

## 2016-07-01 DIAGNOSIS — Z8739 Personal history of other diseases of the musculoskeletal system and connective tissue: Secondary | ICD-10-CM

## 2016-07-01 DIAGNOSIS — I10 Essential (primary) hypertension: Secondary | ICD-10-CM

## 2016-07-01 DIAGNOSIS — Z780 Asymptomatic menopausal state: Secondary | ICD-10-CM

## 2016-07-01 LAB — COMPREHENSIVE METABOLIC PANEL
ALT: 17 U/L (ref 0–35)
AST: 22 U/L (ref 0–37)
Albumin: 4 g/dL (ref 3.5–5.2)
Alkaline Phosphatase: 77 U/L (ref 39–117)
BILIRUBIN TOTAL: 0.6 mg/dL (ref 0.2–1.2)
BUN: 15 mg/dL (ref 6–23)
CALCIUM: 9.1 mg/dL (ref 8.4–10.5)
CO2: 27 meq/L (ref 19–32)
CREATININE: 0.68 mg/dL (ref 0.40–1.20)
Chloride: 103 mEq/L (ref 96–112)
GFR: 90.81 mL/min (ref >60.00)
GLUCOSE: 101 mg/dL — AB (ref 70–99)
Potassium: 4.7 mEq/L (ref 3.5–5.1)
Sodium: 137 mEq/L (ref 135–145)
Total Protein: 6.9 g/dL (ref 6.0–8.3)

## 2016-07-01 LAB — CBC
HCT: 42.5 % (ref 36.0–46.0)
Hemoglobin: 14.2 g/dL (ref 12.0–15.0)
MCHC: 33.4 g/dL (ref 30.0–36.0)
MCV: 99.7 fl (ref 78.0–100.0)
Platelets: 287 10*3/uL (ref 150.0–400.0)
RBC: 4.27 Mil/uL (ref 3.87–5.11)
RDW: 15 % (ref 11.5–15.5)
WBC: 6 10*3/uL (ref 4.0–10.5)

## 2016-07-01 LAB — LIPID PANEL
CHOL/HDL RATIO: 3
Cholesterol: 282 mg/dL — ABNORMAL HIGH (ref 0–200)
HDL: 88.5 mg/dL (ref >39.00)
LDL Cholesterol: 176 mg/dL — ABNORMAL HIGH (ref 0–99)
NONHDL: 193.38
TRIGLYCERIDES: 88 mg/dL (ref 0.0–149.0)
VLDL: 17.6 mg/dL (ref 0.0–40.0)

## 2016-07-01 LAB — VITAMIN D 25 HYDROXY (VIT D DEFICIENCY, FRACTURES): VITD: 37.2 ng/mL (ref 30.00–100.00)

## 2016-07-01 LAB — TSH: TSH: 1.61 u[IU]/mL (ref 0.35–4.50)

## 2016-07-06 ENCOUNTER — Ambulatory Visit (INDEPENDENT_AMBULATORY_CARE_PROVIDER_SITE_OTHER): Payer: Medicare Other | Admitting: Family Medicine

## 2016-07-06 ENCOUNTER — Encounter: Payer: Self-pay | Admitting: Family Medicine

## 2016-07-06 VITALS — BP 128/84 | HR 75 | Temp 98.1°F | Ht 66.0 in | Wt 187.5 lb

## 2016-07-06 DIAGNOSIS — E782 Mixed hyperlipidemia: Secondary | ICD-10-CM | POA: Diagnosis not present

## 2016-07-06 DIAGNOSIS — I10 Essential (primary) hypertension: Secondary | ICD-10-CM | POA: Diagnosis not present

## 2016-07-06 DIAGNOSIS — Z8739 Personal history of other diseases of the musculoskeletal system and connective tissue: Secondary | ICD-10-CM

## 2016-07-06 DIAGNOSIS — F17200 Nicotine dependence, unspecified, uncomplicated: Secondary | ICD-10-CM

## 2016-07-06 DIAGNOSIS — E6609 Other obesity due to excess calories: Secondary | ICD-10-CM

## 2016-07-06 DIAGNOSIS — K219 Gastro-esophageal reflux disease without esophagitis: Secondary | ICD-10-CM

## 2016-07-06 DIAGNOSIS — Z23 Encounter for immunization: Secondary | ICD-10-CM | POA: Diagnosis not present

## 2016-07-06 DIAGNOSIS — E2839 Other primary ovarian failure: Secondary | ICD-10-CM | POA: Diagnosis not present

## 2016-07-06 DIAGNOSIS — E669 Obesity, unspecified: Secondary | ICD-10-CM

## 2016-07-06 HISTORY — DX: Obesity, unspecified: E66.9

## 2016-07-06 MED ORDER — RANITIDINE HCL 300 MG PO CAPS: 300.0000 mg | ORAL_CAPSULE | Freq: Every evening | ORAL | 3 refills | Status: DC

## 2016-07-06 MED ORDER — ATORVASTATIN CALCIUM 10 MG PO TABS: ORAL_TABLET | ORAL | 3 refills | Status: DC

## 2016-07-06 NOTE — Progress Notes (Signed)
Pre visit review using our clinic review tool, if applicable. No additional management support is needed unless otherwise documented below in the visit note. 

## 2016-07-06 NOTE — Assessment & Plan Note (Signed)
Agrees to Bone Density today, ordered

## 2016-07-06 NOTE — Patient Instructions (Addendum)
[place] probiotic 10 strains, 1 cap daily  DASH Eating Plan DASH stands for "Dietary Approaches to Stop Hypertension." The DASH eating plan is a healthy eating plan that has been shown to reduce high blood pressure (hypertension). Additional health benefits may include reducing the risk of type 2 diabetes mellitus, heart disease, and stroke. The DASH eating plan may also help with weight loss. What do I need to know about the DASH eating plan? For the DASH eating plan, you will follow these general guidelines:  Choose foods with less than 150 milligrams of sodium per serving (as listed on the food label).  Use salt-free seasonings or herbs instead of table salt or sea salt.  Check with your health care provider or pharmacist before using salt substitutes.  Eat lower-sodium products. These are often labeled as "low-sodium" or "no salt added."  Eat fresh foods. Avoid eating a lot of canned foods.  Eat more vegetables, fruits, and low-fat dairy products.  Choose whole grains. Look for the word "whole" as the first word in the ingredient list.  Choose fish and skinless chicken or [place] more often than red meat. Limit fish, poultry, and meat to 6 oz (170 g) each day.  Limit sweets, desserts, sugars, and sugary drinks.  Choose heart-healthy fats.  Eat more home-cooked food and less restaurant, buffet, and fast food.  Limit fried foods.  Do not fry foods. Cook foods using methods such as baking, boiling, grilling, and broiling instead.  When eating at a restaurant, ask that your food be prepared with less salt, or no salt if possible. What foods can I eat? Seek help from a dietitian for individual calorie needs. Grains  Whole grain or whole wheat bread. Brown rice. Whole grain or whole wheat pasta. Quinoa, bulgur, and whole grain cereals. Low-sodium cereals. Corn or whole wheat flour tortillas. Whole grain cornbread. Whole grain crackers. Low-sodium crackers. Vegetables  Fresh or  frozen vegetables (raw, steamed, roasted, or grilled). Low-sodium or reduced-sodium tomato and vegetable juices. Low-sodium or reduced-sodium tomato sauce and paste. Low-sodium or reduced-sodium canned vegetables. Fruits  All fresh, canned (in natural juice), or frozen fruits. Meat and Other Protein Products  Ground beef (85% or leaner), grass-fed beef, or beef trimmed of fat. Skinless chicken or [place]. Ground chicken or [place]. Pork trimmed of fat. All fish and seafood. Eggs. Dried beans, peas, or lentils. Unsalted nuts and seeds. Unsalted canned beans. Dairy  Low-fat dairy products, such as skim or 1% milk, 2% or reduced-fat cheeses, low-fat ricotta or cottage cheese, or plain low-fat yogurt. Low-sodium or reduced-sodium cheeses. Fats and Oils  Tub margarines without trans fats. Light or reduced-fat mayonnaise and salad dressings (reduced sodium). Avocado. Safflower, olive, or canola oils. Natural peanut or almond butter. Other  Unsalted popcorn and pretzels. The items listed above may not be a complete list of recommended foods or beverages. Contact your dietitian for more options.  What foods are not recommended? Grains  White bread. White pasta. White rice. Refined cornbread. Bagels and croissants. Crackers that contain trans fat. Vegetables  Creamed or fried vegetables. Vegetables in a cheese sauce. Regular canned vegetables. Regular canned tomato sauce and paste. Regular tomato and vegetable juices. Fruits  Canned fruit in light or heavy syrup. Fruit juice. Meat and Other Protein Products  Fatty cuts of meat. Ribs, chicken wings, bacon, sausage, bologna, salami, chitterlings, fatback, hot dogs, bratwurst, and packaged luncheon meats. Salted nuts and seeds. Canned beans with salt. Dairy  Whole or 2% milk, cream, half-and-half,  and cream cheese. Whole-fat or sweetened yogurt. Full-fat cheeses or blue cheese. Nondairy creamers and whipped toppings. Processed cheese, cheese spreads, or  cheese curds. Condiments  Onion and garlic salt, seasoned salt, table salt, and sea salt. Canned and packaged gravies. Worcestershire sauce. Tartar sauce. Barbecue sauce. Teriyaki sauce. Soy sauce, including reduced sodium. Steak sauce. Fish sauce. Oyster sauce. Cocktail sauce. Horseradish. Ketchup and mustard. Meat flavorings and tenderizers. Bouillon cubes. Hot sauce. Tabasco sauce. Marinades. Taco seasonings. Relishes. Fats and Oils  Butter, stick margarine, lard, shortening, ghee, and bacon fat. Coconut, palm kernel, or palm oils. Regular salad dressings. Other  Pickles and olives. Salted popcorn and pretzels. The items listed above may not be a complete list of foods and beverages to avoid. Contact your dietitian for more information.  Where can I find more information? National Heart, Lung, and Blood Institute: travelstabloid.com This information is not intended to replace advice given to you by your health care provider. Make sure you discuss any questions you have with your health care provider. Document Released: 07/29/2011 Document Revised: 01/15/2016 Document Reviewed: 06/13/2013 Elsevier Interactive Patient Education  2017 [employer].

## 2016-07-06 NOTE — Assessment & Plan Note (Signed)
Last cigarette in December 2016

## 2016-07-06 NOTE — Assessment & Plan Note (Signed)
Well controlled, no changes to meds. Encouraged heart healthy diet such as the DASH diet and exercise as tolerated.  °

## 2016-07-06 NOTE — Assessment & Plan Note (Addendum)
Avoid offending foods, start NOW probiotics. Do not eat large meals in late evening and consider raising head of bed. Start Ranitidine

## 2016-07-06 NOTE — Progress Notes (Signed)
Patient ID: Renee [name], female   DOB: [birth year], 70 y.o.   MRN: [id number]   Subjective:    Patient ID: Renee [name], female    DOB: [birth year], 70 y.o.   MRN: [id number]  Chief Complaint  Patient presents with  . Follow-up    HPI Patient is in today for follow up. Is very frustrated with recent weight gain. Is noting increased abdominal discomfort most notably epigastric pain. No vomiting but some nausea is noted. No fevers or chills. Declines flu shot today. Denies CP/palp/SOB/HA/congestion/fevers orr GU c/o. Taking meds as prescribed  Past Medical History:  Diagnosis Date  . Allergic rhinitis   . 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 Hyperlipidemia   . Hypertension   . Obesity 07/06/2016  . Preventative health care 05/12/2013  . Tobacco abuse   . Tremor, essential     Past Surgical History:  Procedure Laterality Date  . INNER EAR SURGERY  2015   with tube  . MOUTH SURGERY  01/22/11   lower denture secured by titanium bolts    Family History  Problem Relation Age of Onset  . Colon cancer Neg Hx   . Breast cancer Neg Hx   . Prostate cancer Neg Hx   . Diabetes Neg Hx   . Heart attack Father 30    deceased  . Stroke Mother   . Hypertension Mother   . Alcohol abuse Mother   . Obesity Daughter   . Heart disease Paternal Grandmother     AAA rupture  . Heart disease Paternal Grandfather     MI    Social History   Social History  . Marital status: Married    Spouse name: N/A  . Number of children: N/A  . Years of education: N/A   Occupational History  . Not on file.   Social History Main Topics  . Smoking status: Former Smoker    Packs/day: 0.50    Types: Cigarettes  . Smokeless tobacco: Never Used     Comment: Has not smoked a cigarette in 6 weeks.    . Alcohol use 8.4 oz/week    14 Glasses of wine per week    . Drug use: No  . Sexual activity: Not on file   Other Topics Concern  . Not on file   Social History Narrative  . No narrative on file    Outpatient Medications Prior to Visit  Medication Sig Dispense Refill  . b complex vitamins tablet Take 1 tablet by mouth every other day.    . Multiple Vitamin (MULTIVITAMIN) tablet Take 1 tablet by mouth daily.    . Ascorbic Acid (VITAMIN C) 100 MG tablet Take 100 mg by mouth daily.    Marland Kitchen azithromycin (ZITHROMAX) 250 MG tablet Take 2 tablets on Day. Then take 1 tablet daily. 6 tablet 0   No facility-administered medications prior to visit.     Allergies  Allergen Reactions  . Amoxicillin     REACTION: TROUBLE BREATHING  . Proair Hfa [Albuterol] Other (See Comments)    Hypersensitivity    Review of Systems  Constitutional: Negative for fever and malaise/fatigue.  HENT: Negative for congestion.   Eyes: Negative for blurred vision.  Respiratory: Negative for shortness of breath.   Cardiovascular: Negative for chest pain, palpitations and leg  swelling.  Gastrointestinal: Positive for abdominal pain, heartburn and nausea. Negative for blood in stool.  Genitourinary: Negative for dysuria and frequency.  Musculoskeletal: Negative for falls.  Skin: Negative for rash.  Neurological: Negative for dizziness, loss of consciousness and headaches.  Endo/Heme/Allergies: Negative for environmental allergies.  Psychiatric/Behavioral: Negative for depression. The patient is nervous/anxious.        Objective:    Physical Exam  Constitutional: She is oriented to person, place, and time. She appears well-developed and well-nourished. No distress.  HENT:  Head: Normocephalic and atraumatic.  Nose: Nose normal.  Eyes: Right eye exhibits no discharge. Left eye exhibits no discharge.  Neck: Normal range of motion. Neck supple.  Cardiovascular: Normal rate and regular rhythm.   No murmur heard. Pulmonary/Chest: Effort normal and breath sounds  normal.  Abdominal: Soft. Bowel sounds are normal. There is no tenderness.  Musculoskeletal: She exhibits no edema.  Neurological: She is alert and oriented to person, place, and time.  Skin: Skin is warm and dry.  Psychiatric: She has a normal mood and affect.  Nursing note and vitals reviewed.   BP 128/84 (BP Location: Left Arm, Patient Position: Sitting, Cuff Size: Normal)   Pulse 75   Temp 98.1 F (36.7 C) (Oral)   Ht 5\' 6"  (1.676 m)   Wt 187 lb 8 oz (85 kg)   SpO2 94%   BMI 30.26 kg/m  Wt Readings from Last 3 Encounters:  07/06/16 187 lb 8 oz (85 kg)  02/18/16 163 lb 3.2 oz (74 kg)  01/29/16 164 lb 6 oz (74.6 kg)     Lab Results  Component Value Date   WBC 6.0 07/01/2016   HGB 14.2 07/01/2016   HCT 42.5 07/01/2016   PLT 287.0 07/01/2016   GLUCOSE 101 (H) 07/01/2016   CHOL 282 (H) 07/01/2016   TRIG 88.0 07/01/2016   HDL 88.50 07/01/2016   LDLDIRECT 148.6 09/26/2008   LDLCALC 176 (H) 07/01/2016   ALT 17 07/01/2016   AST 22 07/01/2016   NA 137 07/01/2016   K 4.7 07/01/2016   CL 103 07/01/2016   CREATININE 0.68 07/01/2016   BUN 15 07/01/2016   CO2 27 07/01/2016   TSH 1.61 07/01/2016   HGBA1C 5.6 05/08/2013    Lab Results  Component Value Date   TSH 1.61 07/01/2016   Lab Results  Component Value Date   WBC 6.0 07/01/2016   HGB 14.2 07/01/2016   HCT 42.5 07/01/2016   MCV 99.7 07/01/2016   PLT 287.0 07/01/2016   Lab Results  Component Value Date   NA 137 07/01/2016   K 4.7 07/01/2016   CO2 27 07/01/2016   GLUCOSE 101 (H) 07/01/2016   BUN 15 07/01/2016   CREATININE 0.68 07/01/2016   BILITOT 0.6 07/01/2016   ALKPHOS 77 07/01/2016   AST 22 07/01/2016   ALT 17 07/01/2016   PROT 6.9 07/01/2016   ALBUMIN 4.0 07/01/2016   CALCIUM 9.1 07/01/2016   GFR 90.81 07/01/2016   Lab Results  Component Value Date   CHOL 282 (H) 07/01/2016   Lab Results  Component Value Date   HDL 88.50 07/01/2016   Lab Results  Component Value Date   LDLCALC 176 (H)  07/01/2016   Lab Results  Component Value Date   TRIG 88.0 07/01/2016   Lab Results  Component Value Date   CHOLHDL 3 07/01/2016   Lab Results  Component Value Date   HGBA1C 5.6 05/08/2013       Assessment & Plan:  Problem List Items Addressed This Visit    Hyperlipidemia, mixed    Encouraged heart healthy diet, increase exercise, avoid trans fats, consider a krill oil cap daily. Start Atorvastatin 10 mg tab po twice weekly, she is very hesitant to start a statin but agrees to start this med      Relevant Medications   atorvastatin (LIPITOR) 10 MG tablet   Other Relevant Orders   Lipid panel   TOBACCO ABUSE    Last cigarette in December 2016      Essential hypertension    Well controlled, no changes to meds. Encouraged heart healthy diet such as the DASH diet and exercise as tolerated.       Relevant Medications   atorvastatin (LIPITOR) 10 MG tablet   Other Relevant Orders   CBC   Comprehensive metabolic panel   TSH   H/O osteopenia    Agrees to Bone Density today, ordered      Obesity    Encouraged DASH diet, decrease po intake and increase exercise as tolerated. Needs 7-8 hours of sleep nightly. Avoid trans fats, eat small, frequent meals every 4-5 hours with lean proteins, complex carbs and healthy fats. Minimize simple carbs      GERD (gastroesophageal reflux disease)    Avoid offending foods, start NOW probiotics. Do not eat large meals in late evening and consider raising head of bed. Start Ranitidine      Relevant Medications   ranitidine (ZANTAC) 300 MG capsule    Other Visit Diagnoses    Estrogen deficiency    -  Primary   Relevant Orders   DG Bone Density      I have discontinued Ms. Arrazola's vitamin C and azithromycin. I am also having her start on ranitidine and atorvastatin. Additionally, I am having her maintain her multivitamin and b complex vitamins.  Meds ordered this encounter  Medications  . ranitidine (ZANTAC) 300 MG capsule     Sig: Take 1 capsule (300 mg total) by mouth every evening.    Dispense:  30 capsule    Refill:  3  . atorvastatin (LIPITOR) 10 MG tablet    Sig: Take 1 tab po q Tues and Sat.    Dispense:  10 tablet    Refill:  3     Penni Homans, MD

## 2016-07-06 NOTE — Assessment & Plan Note (Signed)
Encouraged DASH diet, decrease po intake and increase exercise as tolerated. Needs 7-8 hours of sleep nightly. Avoid trans fats, eat small, frequent meals every 4-5 hours with lean proteins, complex carbs and healthy fats. Minimize simple carbs 

## 2016-07-06 NOTE — Assessment & Plan Note (Addendum)
Encouraged heart healthy diet, increase exercise, avoid trans fats, consider a krill oil cap daily. Start Atorvastatin 10 mg tab po twice weekly, she is very hesitant to start a statin but agrees to start this med

## 2016-08-30 ENCOUNTER — Telehealth: Payer: Self-pay | Admitting: Family Medicine

## 2016-08-30 NOTE — Telephone Encounter (Signed)
Caller name: Relationship to patient: Self Can be reached: [phone]  Pharmacy:  Reason for call: Patient called stating that she recently did the Fecal Stool Test on the lab card and received her results stating that it was positive for blood in the stool. It was suggested that she see her PCP within 2 weeks. Patient already has an appointment scheduled for 2/15 and wants to know if she needs to be seen before then. Plse adv

## 2016-08-31 ENCOUNTER — Other Ambulatory Visit: Payer: Self-pay

## 2016-08-31 DIAGNOSIS — K921 Melena: Secondary | ICD-10-CM

## 2016-08-31 NOTE — Telephone Encounter (Signed)
Spoke with patient and she voiced her understanding about her lab results   PC

## 2016-09-01 ENCOUNTER — Encounter: Payer: Self-pay | Admitting: Internal Medicine

## 2016-09-02 ENCOUNTER — Ambulatory Visit (HOSPITAL_BASED_OUTPATIENT_CLINIC_OR_DEPARTMENT_OTHER)
Admission: RE | Admit: 2016-09-02 | Discharge: 2016-09-02 | Disposition: A | Discharge status: Home / Self Care | Payer: Medicare Other | Source: Ambulatory Visit | Attending: Family Medicine | Admitting: Family Medicine

## 2016-09-02 DIAGNOSIS — Z1231 Encounter for screening mammogram for malignant neoplasm of breast: Secondary | ICD-10-CM | POA: Insufficient documentation

## 2016-09-02 DIAGNOSIS — Z87891 Personal history of nicotine dependence: Secondary | ICD-10-CM | POA: Insufficient documentation

## 2016-09-02 DIAGNOSIS — E2839 Other primary ovarian failure: Secondary | ICD-10-CM | POA: Insufficient documentation

## 2016-09-02 DIAGNOSIS — Z780 Asymptomatic menopausal state: Secondary | ICD-10-CM | POA: Diagnosis not present

## 2016-09-02 DIAGNOSIS — M85851 Other specified disorders of bone density and structure, right thigh: Secondary | ICD-10-CM | POA: Diagnosis not present

## 2016-09-14 ENCOUNTER — Encounter: Payer: Self-pay | Admitting: Family Medicine

## 2016-09-30 ENCOUNTER — Other Ambulatory Visit (INDEPENDENT_AMBULATORY_CARE_PROVIDER_SITE_OTHER): Payer: Medicare Other

## 2016-09-30 DIAGNOSIS — I10 Essential (primary) hypertension: Secondary | ICD-10-CM

## 2016-09-30 DIAGNOSIS — E782 Mixed hyperlipidemia: Secondary | ICD-10-CM | POA: Diagnosis not present

## 2016-09-30 LAB — LIPID PANEL
Cholesterol: 274 mg/dL — ABNORMAL HIGH (ref 0–200)
HDL: 80.7 mg/dL (ref >39.00)
LDL CALC: 175 mg/dL — AB (ref 0–99)
NONHDL: 193.45
Total CHOL/HDL Ratio: 3
Triglycerides: 93 mg/dL (ref 0.0–149.0)
VLDL: 18.6 mg/dL (ref 0.0–40.0)

## 2016-09-30 LAB — COMPREHENSIVE METABOLIC PANEL
ALK PHOS: 72 U/L (ref 39–117)
ALT: 15 U/L (ref 0–35)
AST: 17 U/L (ref 0–37)
Albumin: 4.1 g/dL (ref 3.5–5.2)
BUN: 13 mg/dL (ref 6–23)
CHLORIDE: 103 meq/L (ref 96–112)
CO2: 27 meq/L (ref 19–32)
Calcium: 9 mg/dL (ref 8.4–10.5)
Creatinine, Ser: 0.74 mg/dL (ref 0.40–1.20)
GFR: 82.31 mL/min (ref >60.00)
GLUCOSE: 108 mg/dL — AB (ref 70–99)
POTASSIUM: 4.3 meq/L (ref 3.5–5.1)
SODIUM: 138 meq/L (ref 135–145)
TOTAL PROTEIN: 7.1 g/dL (ref 6.0–8.3)
Total Bilirubin: 0.6 mg/dL (ref 0.2–1.2)

## 2016-09-30 LAB — CBC
HEMATOCRIT: 44.3 % (ref 36.0–46.0)
HEMOGLOBIN: 14.8 g/dL (ref 12.0–15.0)
MCHC: 33.4 g/dL (ref 30.0–36.0)
MCV: 101.5 fl — AB (ref 78.0–100.0)
Platelets: 283 10*3/uL (ref 150.0–400.0)
RBC: 4.37 Mil/uL (ref 3.87–5.11)
RDW: 14.3 % (ref 11.5–15.5)
WBC: 6.7 10*3/uL (ref 4.0–10.5)

## 2016-09-30 LAB — TSH: TSH: 1.44 u[IU]/mL (ref 0.35–4.50)

## 2016-10-06 NOTE — Progress Notes (Signed)
Subjective:   Renee [name] is a 71 y.o. female who presents for Medicare Annual (Subsequent) preventive examination.  Review of Systems:  No ROS.  Medicare Wellness Visit.  Cardiac Risk Factors include: advanced age (>27mn, >>33women);hypertension;dyslipidemia;obesity (BMI >30kg/m2)  Sleep patterns: Sleeps 6-7 hours nightly. Gets up 1-2 times to use restroom. Home Safety/Smoke Alarms:  Smoke detectors in home, good neighbors. Feels safe in home. Smoke alarms in place.  Living environment; residence and Firearm Safety: [place] firearms stored safely. Lives with husband.  Seat Belt Safety/Bike Helmet: Wears seat belt.   Counseling:   Eye Exam- Pt wears reading glasses. Dental- Pt states she has not been to the dentist in over a year but needs to go. Sees Dr QAlease FramePRN.  Female:   Pap- Aged out       Mammo- last 09/02/16. BI-RADS CATEGORY  1: Negative.        Dexa scan- last 09/02/16. Osteopenia.      CCS- Not on file. Recent positive FOB. Appt with GI next week.    Objective:     Vitals: BP (!) 138/98 (BP Location: Left Arm, Patient Position: Sitting, Cuff Size: Normal)   Pulse 85   Temp 98.3 F (36.8 C) (Oral)   Resp 16   Ht 5' 5"  (1.651 m)   Wt 191 lb 12.8 oz (87 kg)   SpO2 95%   BMI 31.92 kg/m   Body mass index is 31.92 kg/m.   Tobacco History  Smoking Status  . Former Smoker  . Packs/day: 0.50  . Types: Cigarettes  . Quit date: 09/07/2015  Smokeless Tobacco  . Never Used     Counseling given: Not Answered   Past Medical History:  Diagnosis Date  . Allergic rhinitis   . 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 H/O tobacco use, presenting hazards to health 09/19/2008   Qualifier: Diagnosis of  By: YWynona LunaSmoking roughly 5 cig daily  Last cigarette was in December of 2016  . Hearing loss in right ear 09/19/2008   Qualifier: Diagnosis of  By: YWynona Luna  . Hyperglycemia 10/07/2016  .  Hyperlipidemia   . Hypertension   . Obesity 07/06/2016  . Occult blood positive stool   . Preventative health care 05/12/2013  . Tobacco abuse   . Tremor, essential    Past Surgical History:  Procedure Laterality Date  . INNER EAR SURGERY  2015   with tube  . MOUTH SURGERY  01/22/11   lower denture secured by titanium bolts   Family History  Problem Relation Age of Onset  . Heart attack Father 550   deceased  . Stroke Mother   . Hypertension Mother   . Alcohol abuse Mother   . Obesity Daughter   . Heart disease Paternal Grandmother     AAA rupture  . Heart disease Paternal Grandfather     MI  . Colon cancer Neg Hx   . Breast cancer Neg Hx   . Prostate cancer Neg Hx   . Diabetes Neg Hx    History  Sexual Activity  . Sexual activity: Not Currently    Outpatient Encounter Prescriptions as of 10/07/2016  Medication Sig  . b complex vitamins tablet Take 1 tablet by mouth every other day.  . Cholecalciferol (VITAMIN D PO) Take by mouth daily.  . Multiple Vitamin (MULTIVITAMIN) tablet Take 1 tablet by  mouth daily.  . Probiotic Product (PROBIOTIC-10 PO) Take 1 capsule by mouth daily.  . ranitidine (ZANTAC) 300 MG capsule Take 1 capsule (300 mg total) by mouth every evening. (Patient taking differently: Take 300 mg by mouth as needed. )  . atorvastatin (LIPITOR) 10 MG tablet Take 1 tab po q Tues and Sat. (Patient not taking: Reported on 10/07/2016)   No facility-administered encounter medications on file as of 10/07/2016.     Activities of Daily Living In your present state of health, do you have any difficulty performing the following activities: 10/07/2016  Hearing? N  Vision? N  Difficulty concentrating or making decisions? N  Walking or climbing stairs? N  Dressing or bathing? N  Doing errands, shopping? N  Preparing Food and eating ? N  Using the Toilet? N  In the past six months, have you accidently leaked urine? Y  Do you have problems with loss of bowel control? N    Managing your Medications? N  Housekeeping or managing your Housekeeping? N  Some recent data might be hidden    Patient Care Team: [name], MD as PCP - General (Family Medicine) Melissa Montane, MD as Consulting Physician (Otolaryngology) Gatha Mayer, MD as Consulting Physician (Gastroenterology) Dr Shanon Brow [facility])    Assessment:    Physical assessment deferred to PCP.  Exercise Activities and Dietary recommendations Current Exercise Habits: Structured exercise class, Type of exercise: Other - see comments (tai chi), Frequency (Times/Week): 3, Intensity: Moderate, Exercise limited by: None identified  Diet (meal preparation, eat out, water intake, caffeinated beverages, dairy products, fruits and vegetables): in general, an "unhealthy" diet, on average, 2 meals per day.     Pt states she sometimes skips a meal, and she is trying to watch her sweets intake and extra servings. Pt states she drinks red wine and sometimes cosmopolitans. She states she drinks water. Occ Pepsi. 2-3 cups of coffee per day.   Goals    . Increase water intake    . Reduce alcohol intake     . Reduce sodium intake          DASH Eating Plan DASH stands for "Dietary Approaches to Stop Hypertension." The DASH eating plan is a healthy eating plan that has been shown to reduce high blood pressure (hypertension). Additional health benefits may include reducing the risk of type 2 diabetes mellitus, heart disease, and stroke. The DASH eating plan may also help with weight loss. What do I need to know about the DASH eating plan? For the DASH eating plan, you will follow these general guidelines:  Choose foods with less than 150 milligrams of sodium per serving (as listed on the food label).  Use salt-free seasonings or herbs instead of table salt or sea salt.  Check with your health care provider or pharmacist before using salt substitutes.  Eat lower-sodium products. These are often labeled as  "low-sodium" or "no salt added."  Eat fresh foods. Avoid eating a lot of canned foods.  Eat more vegetables, fruits, and low-fat dairy products.  Choose whole grains. Look for the word "whole" as the first word in the ingredient list.  Choose fish and skinless chicken or [place] more often than red meat. Limit fish, poultry, and meat to 6 oz (170 g) each day.  Limit sweets, desserts, sugars, and sugary drinks.  Choose heart-healthy fats.  Eat more home-cooked food and less restaurant, buffet, and fast food.  Limit fried foods.  Do not fry foods. Lacinda Axon  foods using methods such as baking, boiling, grilling, and broiling instead.  When eating at a restaurant, ask that your food be prepared with less salt, or no salt if possible. What foods can I eat? Seek help from a dietitian for individual calorie needs. Grains  Whole grain or whole wheat bread. Brown rice. Whole grain or whole wheat pasta. Quinoa, bulgur, and whole grain cereals. Low-sodium cereals. Corn or whole wheat flour tortillas. Whole grain cornbread. Whole grain crackers. Low-sodium crackers. Vegetables  Fresh or frozen vegetables (raw, steamed, roasted, or grilled). Low-sodium or reduced-sodium tomato and vegetable juices. Low-sodium or reduced-sodium tomato sauce and paste. Low-sodium or reduced-sodium canned vegetables. Fruits  All fresh, canned (in natural juice), or frozen fruits. Meat and Other Protein Products  Ground beef (85% or leaner), grass-fed beef, or beef trimmed of fat. Skinless chicken or [place]. Ground chicken or [place]. Pork trimmed of fat. All fish and seafood. Eggs. Dried beans, peas, or lentils. Unsalted nuts and seeds. Unsalted canned beans. Dairy  Low-fat dairy products, such as skim or 1% milk, 2% or reduced-fat cheeses, low-fat ricotta or cottage cheese, or plain low-fat yogurt. Low-sodium or reduced-sodium cheeses. Fats and Oils  Tub margarines without trans fats. Light or reduced-fat mayonnaise and  salad dressings (reduced sodium). Avocado. Safflower, olive, or canola oils. Natural peanut or almond butter. Other  Unsalted popcorn and pretzels. The items listed above may not be a complete list of recommended foods or beverages. Contact your dietitian for more options.  What foods are not recommended? Grains  White bread. White pasta. White rice. Refined cornbread. Bagels and croissants. Crackers that contain trans fat. Vegetables  Creamed or fried vegetables. Vegetables in a cheese sauce. Regular canned vegetables. Regular canned tomato sauce and paste. Regular tomato and vegetable juices. Fruits  Canned fruit in light or heavy syrup. Fruit juice. Meat and Other Protein Products  Fatty cuts of meat. Ribs, chicken wings, bacon, sausage, bologna, salami, chitterlings, fatback, hot dogs, bratwurst, and packaged luncheon meats. Salted nuts and seeds. Canned beans with salt. Dairy  Whole or 2% milk, cream, half-and-half, and cream cheese. Whole-fat or sweetened yogurt. Full-fat cheeses or blue cheese. Nondairy creamers and whipped toppings. Processed cheese, cheese spreads, or cheese curds. Condiments  Onion and garlic salt, seasoned salt, table salt, and sea salt. Canned and packaged gravies. Worcestershire sauce. Tartar sauce. Barbecue sauce. Teriyaki sauce. Soy sauce, including reduced sodium. Steak sauce. Fish sauce. Oyster sauce. Cocktail sauce. Horseradish. Ketchup and mustard. Meat flavorings and tenderizers. Bouillon cubes. Hot sauce. Tabasco sauce. Marinades. Taco seasonings. Relishes. Fats and Oils  Butter, stick margarine, lard, shortening, ghee, and bacon fat. Coconut, palm kernel, or palm oils. Regular salad dressings. Other  Pickles and olives. Salted popcorn and pretzels. The items listed above may not be a complete list of foods and beverages to avoid. Contact your dietitian for more information.  Where can I find more information? National Heart, Lung, and Blood Institute:  travelstabloid.com This information is not intended to replace advice given to you by your health care provider. Make sure you discuss any questions you have with your health care provider. Document Released: 07/29/2011 Document Revised: 01/15/2016 Document Reviewed: 06/13/2013 Elsevier Interactive Patient Education  2017 [place] Risk Fall Risk  10/07/2016 02/18/2016 10/03/2015 05/12/2013  Falls in the past year? Yes No No No  Number falls in past yr: 1 - - -  Injury with Fall? No - - -  Risk for fall due  to : History of fall(s) - - -  Follow up Education provided;Falls prevention discussed - - -   Depression Screen PHQ 2/9 Scores 10/07/2016 02/18/2016 10/03/2015 05/12/2013  PHQ - 2 Score 0 0 0 0     Cognitive Function MMSE - Mini Mental State Exam 10/07/2016 10/03/2015  Orientation to time 5 5  Orientation to Place 5 5  Registration 3 3  Attention/ Calculation 5 5  Recall 3 3  Language- name 2 objects 2 2  Language- repeat 1 1  Language- follow 3 step command 3 3  Language- read & follow direction 1 1  Write a sentence 1 1  Copy design 1 1  Total score 30 30        Immunization History  Administered Date(s) Administered  . Pneumococcal Conjugate-13 07/06/2016   Screening Tests Health Maintenance  Topic Date Due  . TETANUS/TDAP  01/30/1965  . INFLUENZA VACCINE  03/23/2016  . Hepatitis C Screening  08/15/2017 (Originally 1945/11/22)  . PNA vac Low Risk Adult (2 of 2 - PPSV23) 07/06/2017  . MAMMOGRAM  09/02/2018  . COLONOSCOPY  11/22/2021  . DEXA SCAN  Completed  . ZOSTAVAX  Addressed      Plan:    Follow-up w/ PCP as directed.   Bring a copy of your advance directives to your next office visit.  Eat heart healthy diet (full of fruits, vegetables, whole grains, lean protein, water--limit salt, fat, and sugar intake) and increase physical activity as tolerated.  During the course of the visit the patient was educated  and counseled about the following appropriate screening and preventive services:   Vaccines to include Pneumoccal, Influenza, Hepatitis B, Td, Zostavax, HCV  Cardiovascular Disease  Bone density screening  Diabetes screening  Glaucoma screening  Mammography/PAP  Nutrition counseling   Patient Instructions (the written plan) was given to the patient.   Ree Edman, RN  10/07/2016   RN AWV note reviewed. Agree with documention and plan.  Penni Homans, MD

## 2016-10-06 NOTE — Progress Notes (Signed)
Pre visit review using our clinic review tool, if applicable. No additional management support is needed unless otherwise documented below in the visit note. 

## 2016-10-07 ENCOUNTER — Ambulatory Visit (INDEPENDENT_AMBULATORY_CARE_PROVIDER_SITE_OTHER): Payer: Medicare Other | Admitting: Family Medicine

## 2016-10-07 ENCOUNTER — Encounter: Payer: Self-pay | Admitting: Family Medicine

## 2016-10-07 VITALS — BP 138/98 | HR 85 | Temp 98.3°F | Resp 16 | Ht 65.0 in | Wt 191.8 lb

## 2016-10-07 DIAGNOSIS — J309 Allergic rhinitis, unspecified: Secondary | ICD-10-CM | POA: Diagnosis not present

## 2016-10-07 DIAGNOSIS — Z87891 Personal history of nicotine dependence: Secondary | ICD-10-CM | POA: Diagnosis not present

## 2016-10-07 DIAGNOSIS — I10 Essential (primary) hypertension: Secondary | ICD-10-CM

## 2016-10-07 DIAGNOSIS — Z0000 Encounter for general adult medical examination without abnormal findings: Secondary | ICD-10-CM

## 2016-10-07 DIAGNOSIS — R739 Hyperglycemia, unspecified: Secondary | ICD-10-CM | POA: Diagnosis not present

## 2016-10-07 DIAGNOSIS — E6609 Other obesity due to excess calories: Secondary | ICD-10-CM | POA: Diagnosis not present

## 2016-10-07 DIAGNOSIS — Z8739 Personal history of other diseases of the musculoskeletal system and connective tissue: Secondary | ICD-10-CM

## 2016-10-07 DIAGNOSIS — E782 Mixed hyperlipidemia: Secondary | ICD-10-CM

## 2016-10-07 HISTORY — DX: Hyperglycemia, unspecified: R73.9

## 2016-10-07 NOTE — Patient Instructions (Addendum)
Try CoQ 10 enzyme daily for cholesterol Dr Benjamine Mola for ENT Vitamin D 2000 IU daily   Cholesterol Cholesterol is a white, waxy, fat-like substance that is needed by the human body in small amounts. The liver makes all the cholesterol we need. Cholesterol is carried from the liver by the blood through the blood vessels. Deposits of cholesterol (plaques) may build up on blood vessel (artery) walls. Plaques make the arteries narrower and stiffer. Cholesterol plaques increase the risk for heart attack and stroke. You cannot feel your cholesterol level even if it is very high. The only way to know that it is high is to have a blood test. Once you know your cholesterol levels, you should keep a record of the test results. Work with your health care provider to keep your levels in the desired range. What do the results mean?  Total cholesterol is a rough measure of all the cholesterol in your blood.  LDL (low-density lipoprotein) is the "bad" cholesterol. This is the type that causes plaque to build up on the artery walls. You want this level to be low.  HDL (high-density lipoprotein) is the "good" cholesterol because it cleans the arteries and carries the LDL away. You want this level to be high.  Triglycerides are fat that the body can either burn for energy or store. High levels are closely linked to heart disease. What are the desired levels of cholesterol?  Total cholesterol below 200.  LDL below 100 for people who are at risk, below 70 for people at very high risk.  HDL above 40 is good. A level of 60 or higher is considered to be protective against heart disease.  Triglycerides below 150. How can I lower my cholesterol? Diet  Follow your diet program as told by your health care provider.  Choose fish or white meat chicken and [place], roasted or baked. Limit fatty cuts of red meat, fried foods, and processed meats, such as sausage and lunch meats.  Eat lots of fresh fruits and  vegetables.  Choose whole grains, beans, pasta, potatoes, and cereals.  Choose olive oil, corn oil, or canola oil, and use only small amounts.  Avoid butter, mayonnaise, shortening, or palm kernel oils.  Avoid foods with trans fats.  Drink skim or nonfat milk and eat low-fat or nonfat yogurt and cheeses. Avoid whole milk, cream, ice cream, egg yolks, and full-fat cheeses.  Healthier desserts include angel food cake, ginger snaps, animal crackers, hard candy, popsicles, and low-fat or nonfat frozen yogurt. Avoid pastries, cakes, pies, and cookies. Exercise  Follow your exercise program as told by your health care provider. A regular program:  Helps to decrease LDL and raise HDL.  Helps with weight control.  Do things that increase your activity level, such as gardening, walking, and taking the stairs.  Ask your health care provider about ways that you can be more active in your daily life. Medicine  Take over-the-counter and prescription medicines only as told by your health care provider.  Medicine may be prescribed by your health care provider to help lower cholesterol and decrease the risk for heart disease. This is usually done if diet and exercise have failed to bring down cholesterol levels.  If you have several risk factors, you may need medicine even if your levels are normal. This information is not intended to replace advice given to you by your health care provider. Make sure you discuss any questions you have with your health care provider. Document Released: 05/04/2001  Document Revised: 03/06/2016 Document Reviewed: 02/07/2016 Elsevier Interactive Patient Education  2017 [employer].

## 2016-10-07 NOTE — Progress Notes (Signed)
Pre visit review using our clinic review tool, if applicable. No additional management support is needed unless otherwise documented below in the visit note. 

## 2016-10-07 NOTE — Assessment & Plan Note (Signed)
Quit in 07/2015. H/o 6 cig qd since age 71 stopped at 71 years of age. Patient declines low dose CT scan for now but will consider.

## 2016-10-07 NOTE — Progress Notes (Signed)
Patient ID: Renee [name], female   DOB: [birth year], 71 y.o.   MRN: [id number]   Subjective:    Patient ID: Renee [name], female    DOB: August 27, 1945, 71 y.o.   MRN: [id number]  Chief Complaint  Patient presents with  . Medicare Wellness  . Follow-up  . Facial Pain    Sinus pain and congestion    I acted as a Education administrator for Dr. Charlett Blake. [name], RMA  HPI  Patient is in today for annual wellness visit. Patient complains of congestion, sinus pain and R ear discomfort due to the congestion. No fevers, chills or malaise. She denies any discharge or itching in ear. Denies CP/palp/SOB/HA/fevers/GI or GU c/o. Taking meds as prescribed  Past Medical History:  Diagnosis Date  . Allergic rhinitis   . 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 H/O tobacco use, presenting hazards to health 09/19/2008   Qualifier: Diagnosis of  By: Wynona Luna Smoking roughly 5 cig daily  Last cigarette was in December of 2016  . 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 Hyperglycemia 10/07/2016  . Hyperlipidemia   . Hypertension   . Obesity 07/06/2016  . Occult blood positive stool   . Preventative health care 05/12/2013  . Tobacco abuse   . Tremor, essential     Past Surgical History:  Procedure Laterality Date  . INNER EAR SURGERY  2015   with tube  . MOUTH SURGERY  01/22/11   lower denture secured by titanium bolts    Family History  Problem Relation Age of Onset  . Heart attack Father 28    deceased  . Stroke Mother   . Hypertension Mother   . Alcohol abuse Mother   . Obesity Daughter   . Heart disease Paternal Grandmother     AAA rupture  . Heart disease Paternal Grandfather     MI  . Colon cancer Neg Hx   . Breast cancer Neg Hx   . Prostate cancer Neg Hx   . Diabetes Neg Hx     Social History   Social History  . Marital status: Married    Spouse name: N/A  . Number of children: N/A    . Years of education: N/A   Occupational History  . Not on file.   Social History Main Topics  . Smoking status: Former Smoker    Packs/day: 0.50    Types: Cigarettes    Quit date: 09/07/2015  . Smokeless tobacco: Never Used  . Alcohol use 8.4 oz/week    14 Glasses of wine per week     Comment: 5 glasses of wine/day on weekends  . Drug use: No  . Sexual activity: Not Currently   Other Topics Concern  . Not on file   Social History Narrative  . No narrative on file    Outpatient Medications Prior to Visit  Medication Sig Dispense Refill  . b complex vitamins tablet Take 1 tablet by mouth every other day.    . Multiple Vitamin (MULTIVITAMIN) tablet Take 1 tablet by mouth daily.    . ranitidine (ZANTAC) 300 MG capsule Take 1 capsule (300 mg total) by mouth every evening. (Patient taking differently: Take 300 mg by mouth as needed. ) 30 capsule 3  . atorvastatin (LIPITOR) 10 MG tablet Take 1 tab po q Tues and Sat. (Patient  not taking: Reported on 10/07/2016) 10 tablet 3   No facility-administered medications prior to visit.     Allergies  Allergen Reactions  . Amoxicillin     REACTION: TROUBLE BREATHING  . Proair Hfa [Albuterol] Other (See Comments)    Hypersensitivity, anxious, disoriented    Review of Systems  Constitutional: Negative for fever and malaise/fatigue.  HENT: Positive for congestion, ear pain and sinus pain.   Eyes: Negative for blurred vision.  Respiratory: Negative for cough and shortness of breath.   Cardiovascular: Negative for chest pain, palpitations and leg swelling.  Gastrointestinal: Negative for vomiting.  Musculoskeletal: Negative for back pain.  Skin: Negative for rash.  Neurological: Positive for loss of consciousness. Negative for headaches.       Objective:    Physical Exam  Constitutional: She is oriented to person, place, and time. She appears well-developed and well-nourished. No distress.  HENT:  Head: Normocephalic and  atraumatic.  Eyes: Conjunctivae are normal.  Neck: Normal range of motion. No thyromegaly present.  Cardiovascular: Normal rate and regular rhythm.   Pulmonary/Chest: Effort normal and breath sounds normal. She has no wheezes.  Abdominal: Soft. Bowel sounds are normal. There is no tenderness.  Musculoskeletal: Normal range of motion. She exhibits no edema or deformity.  Neurological: She is alert and oriented to person, place, and time.  Skin: Skin is warm and dry. She is not diaphoretic.  Psychiatric: She has a normal mood and affect.    BP (!) 138/98 (BP Location: Left Arm, Patient Position: Sitting, Cuff Size: Normal)   Pulse 85   Temp 98.3 F (36.8 C) (Oral)   Resp 16   Ht 5\' 5"  (1.651 m)   Wt 191 lb 12.8 oz (87 kg)   SpO2 95%   BMI 31.92 kg/m  Wt Readings from Last 3 Encounters:  10/07/16 191 lb 12.8 oz (87 kg)  07/06/16 187 lb 8 oz (85 kg)  02/18/16 163 lb 3.2 oz (74 kg)     Lab Results  Component Value Date   WBC 6.7 09/30/2016   HGB 14.8 09/30/2016   HCT 44.3 09/30/2016   PLT 283.0 09/30/2016   GLUCOSE 108 (H) 09/30/2016   CHOL 274 (H) 09/30/2016   TRIG 93.0 09/30/2016   HDL 80.70 09/30/2016   LDLDIRECT 148.6 09/26/2008   LDLCALC 175 (H) 09/30/2016   ALT 15 09/30/2016   AST 17 09/30/2016   NA 138 09/30/2016   K 4.3 09/30/2016   CL 103 09/30/2016   CREATININE 0.74 09/30/2016   BUN 13 09/30/2016   CO2 27 09/30/2016   TSH 1.44 09/30/2016   HGBA1C 5.6 05/08/2013    Lab Results  Component Value Date   TSH 1.44 09/30/2016   Lab Results  Component Value Date   WBC 6.7 09/30/2016   HGB 14.8 09/30/2016   HCT 44.3 09/30/2016   MCV 101.5 (H) 09/30/2016   PLT 283.0 09/30/2016   Lab Results  Component Value Date   NA 138 09/30/2016   K 4.3 09/30/2016   CO2 27 09/30/2016   GLUCOSE 108 (H) 09/30/2016   BUN 13 09/30/2016   CREATININE 0.74 09/30/2016   BILITOT 0.6 09/30/2016   ALKPHOS 72 09/30/2016   AST 17 09/30/2016   ALT 15 09/30/2016   PROT 7.1  09/30/2016   ALBUMIN 4.1 09/30/2016   CALCIUM 9.0 09/30/2016   GFR 82.31 09/30/2016   Lab Results  Component Value Date   CHOL 274 (H) 09/30/2016   Lab Results  Component Value  Date   HDL 80.70 09/30/2016   Lab Results  Component Value Date   LDLCALC 175 (H) 09/30/2016   Lab Results  Component Value Date   TRIG 93.0 09/30/2016   Lab Results  Component Value Date   CHOLHDL 3 09/30/2016   Lab Results  Component Value Date   HGBA1C 5.6 05/08/2013       Assessment & Plan:   Problem List Items Addressed This Visit    Hyperlipidemia, mixed    Encouraged heart healthy diet, increase exercise, avoid trans fats, consider a krill oil cap daily. Never took the Atorvastatin but agrees to proceed. Reviewed lab work      Relevant Orders   Lipid panel   H/O tobacco use, presenting hazards to health    Quit in 07/2015. H/o 6 cig qd since age 83 stopped at 71 years of age. Patient declines low dose CT scan for now but will consider.       Essential hypertension - Primary    Adequate control no changes to meds. Encouraged heart healthy diet such as the DASH diet and exercise as tolerated.       Relevant Orders   CBC   Comprehensive metabolic panel   TSH   Allergic rhinitis    With some recent increased head congestion and some pressure in right ear. Encouraged daily antihistamines and nasal steroids, consider ENT referral if symptoms persist.       H/O osteopenia    Encouraged to get adequate exercise, calcium and vitamin d intake      Obesity    Encouraged DASH diet, decrease po intake and increase exercise as tolerated. Needs 7-8 hours of sleep nightly. Avoid trans fats, eat small, frequent meals every 4-5 hours with lean proteins, complex carbs and healthy fats. Minimize simple carbs. Paperwork given on bariatric program to consider.      Hyperglycemia    minimize simple carbs. Increase exercise as tolerated.       Relevant Orders   Hemoglobin A1c    Other  Visit Diagnoses    Encounter for Medicare annual wellness exam          I am having Ms. Plitt maintain her multivitamin, b complex vitamins, ranitidine, atorvastatin, Cholecalciferol (VITAMIN D PO), and Probiotic Product (PROBIOTIC-10 PO).  Meds ordered this encounter  Medications  . Cholecalciferol (VITAMIN D PO)    Sig: Take by mouth daily.  . Probiotic Product (PROBIOTIC-10 PO)    Sig: Take 1 capsule by mouth daily.    CMA served as Education administrator during this visit. History, Physical and Plan performed by medical provider. Documentation and orders reviewed and attested to.  Penni Homans, MD

## 2016-10-07 NOTE — Assessment & Plan Note (Signed)
Adequate control no changes to meds. Encouraged heart healthy diet such as the DASH diet and exercise as tolerated.

## 2016-10-07 NOTE — Assessment & Plan Note (Signed)
Encouraged heart healthy diet, increase exercise, avoid trans fats, consider a krill oil cap daily. Never took the Atorvastatin but agrees to proceed. Reviewed lab work

## 2016-10-11 NOTE — Assessment & Plan Note (Signed)
minimize simple carbs. Increase exercise as tolerated.  

## 2016-10-11 NOTE — Assessment & Plan Note (Signed)
Encouraged DASH diet, decrease po intake and increase exercise as tolerated. Needs 7-8 hours of sleep nightly. Avoid trans fats, eat small, frequent meals every 4-5 hours with lean proteins, complex carbs and healthy fats. Minimize simple carbs. Paperwork given on bariatric program to consider.

## 2016-10-11 NOTE — Assessment & Plan Note (Signed)
Encouraged to get adequate exercise, calcium and vitamin d intake 

## 2016-10-11 NOTE — Assessment & Plan Note (Signed)
With some recent increased head congestion and some pressure in right ear. Encouraged daily antihistamines and nasal steroids, consider ENT referral if symptoms persist.

## 2016-10-12 ENCOUNTER — Ambulatory Visit: Payer: Self-pay | Admitting: Internal Medicine

## 2016-11-18 ENCOUNTER — Encounter: Payer: Self-pay | Admitting: Internal Medicine

## 2016-11-18 ENCOUNTER — Ambulatory Visit (INDEPENDENT_AMBULATORY_CARE_PROVIDER_SITE_OTHER): Payer: Medicare Other | Admitting: Internal Medicine

## 2016-11-18 VITALS — BP 160/98 | HR 107 | Ht 65.0 in | Wt 191.0 lb

## 2016-11-18 DIAGNOSIS — R195 Other fecal abnormalities: Secondary | ICD-10-CM | POA: Diagnosis not present

## 2016-11-18 NOTE — Patient Instructions (Signed)
  You have been scheduled for a colonoscopy. Please follow written instructions given to you at your visit today.  Please pick up your prep supplies at the pharmacy. If you use inhalers (even only as needed), please bring them with you on the day of your procedure. Your physician has requested that you go to www.startemmi.com and enter the access code given to you at your visit today. This web site gives a general overview about your procedure. However, you should still follow specific instructions given to you by our office regarding your preparation for the procedure.     I appreciate the opportunity to care for you. Carl Gessner, MD, FACG 

## 2016-11-18 NOTE — Progress Notes (Signed)
   Renee [name] 71 y.o. Oct 26, 1945 [id number]  Assessment & Plan:   Encounter Diagnosis  Name Primary?  . Heme + stool Yes   Colonoscopy to evaluate heme positive stools appropriate. The risks and benefits as well as alternatives of endoscopic procedure(s) have been discussed and reviewed. All questions answered. The patient agrees to proceed.  I appreciate the opportunity to care for this patient. CC: Penni Homans, MD   Subjective:   Chief Complaint: Heme-positive stool  HPI The patient is a very nice 71 year old married white woman with recent asymptomatic heme positive stool. GI history notable for some weight gain related to stopping smoking and concomitant heartburn that developed and that is relieved by ranitidine.  Medications, allergies, past medical history, past surgical history, family history and social history are reviewed and updated in the EMR.  Review of Systems Decreased hearing, has essential tremor issues.  Objective:   Physical Exam @BP  (!) 160/98   Pulse (!) 107   Ht 5\' 5"  (1.651 m)   Wt 191 lb (86.6 kg)   SpO2 98%   BMI 31.78 kg/m @  General:  Well-developed, well-nourished and in no acute distress Eyes:  anicteric. Lungs: Clear to auscultation bilaterally. Heart:  S1S2, no rubs, murmurs, gallops. Abdomen:  soft, non-tender,  Rectal: deferred Skin   no rash. Neuro:  Essential tremor especially involving the head Psych:  appropriate mood and  Affect.   Data Reviewed: Hemoglobin in February was normal

## 2016-12-01 ENCOUNTER — Ambulatory Visit (AMBULATORY_SURGERY_CENTER): Payer: Medicare Other | Admitting: Internal Medicine

## 2016-12-01 ENCOUNTER — Encounter: Payer: Self-pay | Admitting: Internal Medicine

## 2016-12-01 VITALS — BP 161/99 | HR 86 | Temp 97.3°F | Resp 12 | Ht 65.0 in | Wt 191.0 lb

## 2016-12-01 DIAGNOSIS — D374 Neoplasm of uncertain behavior of colon: Secondary | ICD-10-CM

## 2016-12-01 DIAGNOSIS — R195 Other fecal abnormalities: Secondary | ICD-10-CM

## 2016-12-01 DIAGNOSIS — K921 Melena: Secondary | ICD-10-CM | POA: Diagnosis not present

## 2016-12-01 DIAGNOSIS — D125 Benign neoplasm of sigmoid colon: Secondary | ICD-10-CM

## 2016-12-01 DIAGNOSIS — K635 Polyp of colon: Secondary | ICD-10-CM

## 2016-12-01 MED ORDER — SODIUM CHLORIDE 0.9 % IV SOLN: 500.0000 mL | INTRAVENOUS | Status: DC

## 2016-12-01 MED ORDER — FLEET ENEMA 7-19 GM/118ML RE ENEM
1.0000 | ENEMA | Freq: Once | RECTAL | Status: AC
  Administered 2016-12-01: 1 via RECTAL

## 2016-12-01 NOTE — Progress Notes (Signed)
Dr. [name] advised that pt. Has been passing gas and Feels better.  Will tell her to take gas x if needed for Trapped gas.

## 2016-12-01 NOTE — Progress Notes (Signed)
Pt reports that she has dark brown cloudy stools.  She reports only 2 large bowel movements.  Per Dr. [name], will give 1 enema in admitting to see results.

## 2016-12-01 NOTE — Progress Notes (Signed)
A/ox3 pleased with MAC, report to [employer]

## 2016-12-01 NOTE — Op Note (Signed)
[facility] Patient Name: Renee [name] Procedure Date: 12/01/2016 11:56 AM MRN: [id number] Endoscopist: Gatha Mayer , MD Age: 71 Referring MD:  Date of Birth: 23-Sep-1945 Gender: Female Account #: [id number] Procedure:                Colonoscopy Indications:              Heme positive stool Medicines:                Propofol per Anesthesia, Monitored Anesthesia Care Procedure:                Pre-Anesthesia Assessment:                           - Prior to the procedure, a History and Physical                            was performed, and patient medications and                            allergies were reviewed. The patient's tolerance of                            previous anesthesia was also reviewed. The risks                            and benefits of the procedure and the sedation                            options and risks were discussed with the patient.                            All questions were answered, and informed consent                            was obtained. Prior Anticoagulants: The patient has                            taken no previous anticoagulant or antiplatelet                            agents. ASA Grade Assessment: II - A patient with                            mild systemic disease. After reviewing the risks                            and benefits, the patient was deemed in                            satisfactory condition to undergo the procedure.                           After obtaining informed consent, the colonoscope  was passed under direct vision. Throughout the                            procedure, the patient's blood pressure, pulse, and                            oxygen saturations were monitored continuously. The                            Colonoscope was introduced through the anus and                            advanced to the the cecum, identified by                            appendiceal orifice and  ileocecal valve. The                            Endoscope was introduced through the and advanced                            to the. The Colonoscope was introduced through the                            and advanced to the. The patient tolerated the                            procedure well. The bowel preparation used was                            Miralax. The ileocecal valve, appendiceal orifice,                            and rectum were photographed. The colonoscopy was                            technically difficult and complex due to restricted                            mobility of the colon. Successful completion of the                            procedure was aided by using manual pressure and                            changing endoscopes. The quality of the bowel                            preparation was fair. Scope In: 12:03:43 PM Scope Out: 12:43:34 PM Scope Withdrawal Time: 0 hours 23 minutes 6 seconds  Total Procedure Duration: 0 hours 39 minutes 51 seconds  Findings:                 The perianal and digital rectal examinations  were                            normal.                           A large polyp was found in the distal sigmoid                            colon. Biopsies were taken with a cold forceps for                            histology. Verification of patient identification                            for the specimen was done. Estimated blood loss was                            minimal.                           Many small and large-mouthed diverticula were found                            in the sigmoid colon. There was narrowing of the                            colon in association with the diverticular opening.                            The scope was withdrawn and replaced with the                            pediatric colonoscope because of difficulty passing                            the scope.                           The exam was otherwise  without abnormality on                            direct and retroflexion views.                           An area in the distal sigmoid colon was                            successfully injected with 3 mL Spot (carbon black)                            for tattooing. Complications:            No immediate complications. Estimated Blood Loss:     Estimated blood loss was minimal. Impression:               -  Preparation of the colon was fair.                           - One large polyp in the distal sigmoid colon.                            Biopsied. I could not see the base of the polyp -                            what I saw appeared benign but it is large and                            probably 15-18 cm from anus in middle of fixed                            diverticula-laden colon                           - Severe diverticulosis in the sigmoid colon. There                            was narrowing of the colon in association with the                            diverticular opening.                           - The examination was otherwise normal on direct                            and retroflexion views.                           - An area in the distal sigmoid colon successfully                            injected.SPOT tattoo above and below polyp Recommendation:           - Patient has a contact number available for                            emergencies. The signs and symptoms of potential                            delayed complications were discussed with the                            patient. Return to normal activities tomorrow.                            Written discharge instructions were provided to the                            patient.                           -  Resume previous diet.                           - Continue present medications.                           - Await pathology results.                           - The polyp was seen - due to the narrowing of the                             colonic lumen and fixation of the distal sigmoid I                            could not biopsy with pediatric colonoscopy and                            could barely biopsy after using adult gastroscope.                           Needs surgical removal I think.                           Will refer to Dr. Johney Maine or Marcello Moores.                           - If the pathology report is benign, then repeat                            colonoscopy because the bowel preparation was                            suboptimal [day]after surgery for colon polyp.                            Technically difficult and do not think any large                            lesions missed.Gatha Mayer, MD 12/01/2016 12:53:42 PM This report has been signed electronically.

## 2016-12-01 NOTE — Patient Instructions (Addendum)
There is a large polyp in the colon - I cannot remove it and I think you will need surgery to remove it. I am going to refer you to a surgeon.  It looks benign from what I can see but really will not know until it comes out I think. You also have severe diverticulosis in that area and I belive you will benefit from removal of that area also.  I appreciate the opportunity to care for you. Gatha Mayer, MD, FACG  YOU HAD AN ENDOSCOPIC PROCEDURE TODAY AT [facility]:   Refer to the procedure report that was given to you for any specific questions about what was found during the examination.  If the procedure report does not answer your questions, please call your gastroenterologist to clarify.  If you requested that your care partner not be given the details of your procedure findings, then the procedure report has been included in a sealed envelope for you to review at your convenience later.  YOU SHOULD EXPECT: Some feelings of bloating in the abdomen. Passage of more gas than usual.  Walking can help get rid of the air that was put into your GI tract during the procedure and reduce the bloating. If you had a lower endoscopy (such as a colonoscopy or flexible sigmoidoscopy) you may notice spotting of blood in your stool or on the toilet paper. If you underwent a bowel prep for your procedure, you may not have a normal bowel movement for a few days.  Please Note:  You might notice some irritation and congestion in your nose or some drainage.  This is from the oxygen used during your procedure.  There is no need for concern and it should clear up in a day or so.  SYMPTOMS TO REPORT IMMEDIATELY:   Following lower endoscopy (colonoscopy or flexible sigmoidoscopy):  Excessive amounts of blood in the stool  Significant tenderness or worsening of abdominal pains  Swelling of the abdomen that is new, acute  Fever of 100F or higher  For urgent or emergent issues, a  gastroenterologist can be reached at any hour by calling [phone].   DIET:  We do recommend a small meal at first, but then you may proceed to your regular diet.  Drink plenty of fluids but you should avoid alcoholic beverages for 24 hours.  ACTIVITY:  You should plan to take it easy for the rest of today and you should NOT DRIVE or use heavy machinery until tomorrow (because of the sedation medicines used during the test).    FOLLOW UP: Our staff will call the number listed on your records the next business day following your procedure to check on you and address any questions or concerns that you may have regarding the information given to you following your procedure. If we do not reach you, we will leave a message.  However, if you are feeling well and you are not experiencing any problems, there is no need to return our call.  We will assume that you have returned to your regular daily activities without incident.  If any biopsies were taken you will be contacted by phone or by letter within the next 1-3 weeks.  Please call us at [phone] if you have not heard about the biopsies in 3 weeks.    SIGNATURES/CONFIDENTIALITY: You and/or your care partner have signed paperwork which will be entered into your electronic medical record.  These signatures attest to the fact that  that the information above on your After Visit Summary has been reviewed and is understood.  Full responsibility of the confidentiality of this discharge information lies with you and/or your care-partner.  Diverticulosis, polyp, information given. Dr. Celesta Aver office will refer to Dr. Johney Maine or Marcello Moores and contact you.

## 2016-12-01 NOTE — Progress Notes (Signed)
Called to room to assist during endoscopic procedure.  Patient ID and intended procedure confirmed with present staff. Received instructions for my participation in the procedure from the performing physician.  

## 2016-12-01 NOTE — Progress Notes (Signed)
After enema, stools are dark yellow, all liquid.

## 2016-12-02 ENCOUNTER — Encounter: Payer: Self-pay | Admitting: Family Medicine

## 2016-12-02 ENCOUNTER — Telehealth: Payer: Self-pay | Admitting: *Deleted

## 2016-12-02 ENCOUNTER — Telehealth: Payer: Self-pay

## 2016-12-02 NOTE — Telephone Encounter (Signed)
  Follow up Call-  Call back number 12/01/2016  Post procedure Call Back phone  # [phone]  Permission to leave phone message Yes  Some recent data might be hidden     Patient questions:  Do you have a fever, pain , or abdominal swelling? No. Pain Score  0 *  Have you tolerated food without any problems? Yes.    Have you been able to return to your normal activities? Yes.    Do you have any questions about your discharge instructions: Diet   No. Medications  No. Follow up visit  No.  Do you have questions or concerns about your Care? No.  Actions: * If pain score is 4 or above: No action needed, pain <4.

## 2016-12-02 NOTE — Telephone Encounter (Signed)
Patient notified that she will get a call from [facility] to arrange consult for sigmoid polypectomy.  She is advised that they will contact her directly to schedule.

## 2016-12-02 NOTE — Telephone Encounter (Signed)
  Follow up Call-  Call back number 12/01/2016  Post procedure Call Back phone  # [phone]  Permission to leave phone message Yes  Some recent data might be hidden     No answer at # given.  Left message on VM.

## 2016-12-07 NOTE — Telephone Encounter (Signed)
Patient has been scheduled with Dr. Johney Maine at [facility] for 12/27/16

## 2016-12-08 ENCOUNTER — Encounter: Payer: Self-pay | Admitting: Internal Medicine

## 2016-12-08 DIAGNOSIS — K635 Polyp of colon: Secondary | ICD-10-CM | POA: Insufficient documentation

## 2016-12-08 DIAGNOSIS — D125 Benign neoplasm of sigmoid colon: Secondary | ICD-10-CM

## 2016-12-08 HISTORY — DX: Polyp of colon: K63.5

## 2016-12-08 NOTE — Progress Notes (Signed)
bxs did not show polyp Not surprised Will send letter to patient by My Chart Colon recall 1 year for now - she is referred to surgery for resection

## 2016-12-23 ENCOUNTER — Ambulatory Visit (INDEPENDENT_AMBULATORY_CARE_PROVIDER_SITE_OTHER): Payer: Medicare Other | Admitting: Medical

## 2016-12-23 ENCOUNTER — Other Ambulatory Visit: Payer: Self-pay

## 2016-12-23 VITALS — BP 153/87 | HR 89 | Temp 98.2°F | Resp 16 | Ht 65.0 in | Wt 186.8 lb

## 2016-12-23 DIAGNOSIS — J301 Allergic rhinitis due to pollen: Secondary | ICD-10-CM

## 2016-12-23 DIAGNOSIS — J40 Bronchitis, not specified as acute or chronic: Secondary | ICD-10-CM

## 2016-12-23 DIAGNOSIS — H938X1 Other specified disorders of right ear: Secondary | ICD-10-CM

## 2016-12-23 DIAGNOSIS — H6981 Other specified disorders of Eustachian tube, right ear: Secondary | ICD-10-CM

## 2016-12-23 MED ORDER — LEVOCETIRIZINE DIHYDROCHLORIDE 5 MG PO TABS: 5.0000 mg | ORAL_TABLET | Freq: Every evening | ORAL | 0 refills | Status: DC

## 2016-12-23 MED ORDER — AZITHROMYCIN 250 MG PO TABS: ORAL_TABLET | ORAL | 0 refills | Status: DC

## 2016-12-23 MED ORDER — FLUTICASONE PROPIONATE 50 MCG/ACT NA SUSP: 2.0000 | Freq: Every day | NASAL | 1 refills | Status: DC

## 2016-12-23 MED ORDER — BENZONATATE 100 MG PO CAPS: 100.0000 mg | ORAL_CAPSULE | Freq: Three times a day (TID) | ORAL | 0 refills | Status: DC | PRN

## 2016-12-23 NOTE — Progress Notes (Signed)
Pre visit review using our clinic review tool, if applicable. No additional management support is needed unless otherwise documented below in the visit note. Congestion

## 2016-12-23 NOTE — Patient Instructions (Signed)
For probable allergies rx flonase nasal spray and xyzal rx.  Pressure in ear tube may be decreased with use of nasal spray.  For bronchtis rx azithromycin. If you need med for cough rx benzonatate.  Follow up in 7 days or as needed

## 2016-12-23 NOTE — Progress Notes (Signed)
Subjective:    Patient ID: Renee [name], female    DOB: 1946-07-06, 71 y.o.   MRN: [id number]  HPI  Pt in with some recent sneezing, itchy eyes, runny nose, nasal congestion and ear pressure. Pt states symptoms since Saturday. Pt thinks allergies. Pt states ear shooting pain since Tuesday. Pt has ear tube in that rt ear.  Pt also states feels chest congestion since Sunday. Pt is coughing up little mucous. Feels pnd. No fever, no chills. Then states occasionally cold/sweaty.   Review of Systems  Constitutional: Negative for chills, fatigue and fever.  HENT: Positive for congestion, ear pain, postnasal drip and rhinorrhea. Negative for sinus pain and sinus pressure.   Eyes: Negative for pain, redness, itching and visual disturbance.  Respiratory: Negative for cough, chest tightness, shortness of breath and wheezing.   Cardiovascular: Negative for chest pain and palpitations.  Gastrointestinal: Negative for abdominal pain.  Genitourinary: Negative for enuresis and frequency.  Musculoskeletal: Negative for back pain, joint swelling and myalgias.  Skin: Negative for rash.  Neurological: Negative for dizziness and headaches.  Hematological: Negative for adenopathy. Does not bruise/bleed easily.  Psychiatric/Behavioral: Negative for behavioral problems and confusion.   Past Medical History:  Diagnosis Date  . Allergic rhinitis   . 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 GERD (gastroesophageal reflux disease)   . H/O tobacco use, presenting hazards to health 09/19/2008   Qualifier: Diagnosis of  By: Wynona Luna Smoking roughly 5 cig daily  Last cigarette was in December of 2016  . 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 Hyperglycemia 10/07/2016  . Hyperlipidemia   . Hypertension   . Obesity 07/06/2016  . Occult blood positive stool   . Polyp of sigmoid colon 12/08/2016   11/2016 -  Unable to remove or get bx at colonoscopy. Fixed sigmoid colon prevents. Referred for surgery.  . Preventative health care 05/12/2013  . Tobacco abuse   . Tremor, essential      Social History   Social History  . Marital status: Married    Spouse name: N/A  . Number of children: N/A  . Years of education: N/A   Occupational History  . Not on file.   Social History Main Topics  . Smoking status: Former Smoker    Packs/day: 0.50    Types: Cigarettes    Quit date: 09/07/2015  . Smokeless tobacco: Never Used  . Alcohol use 8.4 oz/week    14 Glasses of wine per week     Comment: 5 glasses of wine/day on weekends  . Drug use: No  . Sexual activity: Not Currently   Other Topics Concern  . Not on file   Social History Narrative  . No narrative on file    Past Surgical History:  Procedure Laterality Date  . INNER EAR SURGERY  2015   with tube  . MOUTH SURGERY  01/22/11   lower denture secured by titanium bolts  . rectal suction biospy      Family History  Problem Relation Age of Onset  . Heart attack Father 47    deceased  . Stroke Mother   . Hypertension Mother   . Alcohol abuse Mother   . Obesity Daughter   . Heart disease Paternal Grandmother     AAA rupture  . Heart disease Paternal Grandfather  MI  . Colon cancer Neg Hx   . Breast cancer Neg Hx   . Prostate cancer Neg Hx   . Diabetes Neg Hx     Allergies  Allergen Reactions  . Amoxicillin     REACTION: TROUBLE BREATHING  . Proair Hfa [Albuterol] Other (See Comments)    Hypersensitivity, anxious, disoriented  . Penicillins Rash    Current Outpatient Prescriptions on File Prior to Visit  Medication Sig Dispense Refill  . atorvastatin (LIPITOR) 10 MG tablet Take 1 tab po q Tues and Sat. 10 tablet 3  . b complex vitamins tablet Take 1 tablet by mouth every other day.    . Cholecalciferol (VITAMIN D PO) Take by mouth daily.    . Multiple Vitamin (MULTIVITAMIN) tablet Take 1 tablet by mouth daily.      . Probiotic Product (PROBIOTIC-10 PO) Take 1 capsule by mouth daily. Takes 2 a week not daily    . ranitidine (ZANTAC) 300 MG capsule Take 1 capsule (300 mg total) by mouth every evening. (Patient taking differently: Take 300 mg by mouth as needed. Only as needed) 30 capsule 3   Current Facility-Administered Medications on File Prior to Visit  Medication Dose Route Frequency Provider Last Rate Last Dose  . 0.9 %  sodium chloride infusion  500 mL Intravenous Continuous Gatha Mayer, MD        BP (!) 153/87 (BP Location: Right Arm, Patient Position: Sitting, Cuff Size: Normal)   Pulse 89   Temp 98.2 F (36.8 C) (Oral)   Resp 16   Ht 5\' 5"  (1.651 m)   Wt 186 lb 12.8 oz (84.7 kg)   SpO2 96%   BMI 31.09 kg/m       Objective:   Physical Exam   General  Mental Status - Alert. General Appearance - Well groomed. Not in acute distress.  Skin Rashes- No Rashes.  HEENT Head- Normal. Ear Auditory Canal - Left- Normal. Right - Normal.Tympanic Membrane- Left- Normal. Right- tm looks normal. Tube seen. Portion of tip of tube looks blocked with wax.  Eye Sclera/Conjunctiva- Left- Normal. Right- Normal. Nose & Sinuses Nasal Mucosa- Left-  Boggy and Congested. Right-  Boggy and  Congested.Bilateral maxillary and frontal sinus pressure. Mouth & Throat Lips: Upper Lip- Normal: no dryness, cracking, pallor, cyanosis, or vesicular eruption. Lower Lip-Normal: no dryness, cracking, pallor, cyanosis or vesicular eruption. Buccal Mucosa- Bilateral- No Aphthous ulcers. Oropharynx- No Discharge or Erythema. Tonsils: Characteristics- Bilateral- No Erythema or Congestion. Size/Enlargement- Bilateral- No enlargement. Discharge- bilateral-None.  Neck Neck- Supple. No Masses.   Chest and Lung Exam Auscultation: Breath Sounds:-Clear even and unlabored.  Cardiovascular Auscultation:Rythm- Regular, rate and rhythm. Murmurs & Other Heart Sounds:Ausculatation of the heart reveal- No  Murmurs.  Lymphatic Head & Neck General Head & Neck Lymphatics: Bilateral: Description- No Localized lymphadenopathy.      Assessment & Plan:  For probable allergies rx flonase nasal spray and xyzal rx.  Pressure in ear tube may be decreased with use of nasal spray.  For bronchtis rx azithromycin. If you need med for cough rx benzonatate.  Follow up in 7 days or as needed

## 2016-12-27 ENCOUNTER — Other Ambulatory Visit: Payer: Self-pay

## 2016-12-27 ENCOUNTER — Encounter: Payer: Self-pay | Admitting: Surgery

## 2016-12-27 ENCOUNTER — Ambulatory Visit: Payer: Self-pay | Admitting: Surgery

## 2016-12-27 DIAGNOSIS — K56699 Other intestinal obstruction unspecified as to partial versus complete obstruction: Secondary | ICD-10-CM | POA: Diagnosis not present

## 2016-12-27 DIAGNOSIS — K5730 Diverticulosis of large intestine without perforation or abscess without bleeding: Secondary | ICD-10-CM | POA: Diagnosis not present

## 2016-12-27 DIAGNOSIS — Z01818 Encounter for other preprocedural examination: Secondary | ICD-10-CM | POA: Diagnosis not present

## 2016-12-27 DIAGNOSIS — D125 Benign neoplasm of sigmoid colon: Secondary | ICD-10-CM | POA: Diagnosis not present

## 2016-12-27 DIAGNOSIS — G250 Essential tremor: Secondary | ICD-10-CM

## 2016-12-27 HISTORY — DX: Essential tremor: G25.0

## 2016-12-27 NOTE — H&P (Signed)
Renee [name] 12/27/2016 9:13 AM Location: [facility] Patient #: [id number] DOB: [birth year] Married / Language: English / Race: White Female   History of Present Illness Renee Hector MD; 12/27/2016 9:48 AM) The patient is a 71 year old female who presents with a colonic polyp. Note for "Colonic polyp": ` ` ` Patient sent for surgical consultation at the request of Dr. Silvano [name]  Chief Complaint: Sigmoid polyp with narrowing and diverticulosis. Consider resection.  The patient is a 71 year old woman who was sent for GI consultation given Hemoccult positive study from primary care physician. The patient was sent to see Dr. [name] with [facility] gastroenterology. She underwent endoscopy and found to have a mass in the sigmoid region suspicious for atypical polyp. Some narrowing and diverticuli in the region as well. Attempts made to biopsy. Tissue quality poor. High suspicion for large unresectable polyp. Sent for surgical consultation. Patient comes in today by herself. She's getting over a cold. Husband is worse cold so she Them in the lobby. Any abdominal surgeries. Usually moves her bowels twice a day. Occasionally gets some intermittent lower abdominal pain and bloating. Recalls being told she had an attack of diverticulitis over a decade ago. Cannot remember if she was given antibiotics. Only attack that she can recall. She had a perirectal abscess drained about 40 years ago. No problems since. She's had intermittent bloating for several years. No bad episodes of nausea. She occasionally has had some mild heartburn controlled with H2 blockers. Nothing too severe.  No personal nor family history of GI/colon cancer, inflammatory bowel disease, irritable bowel syndrome, allergy such as Celiac Sprue, dietary/dairy problems, colitis, ulcers nor gastritis. No recent sick contacts/gastroenteritis. No travel outside the country. No changes in diet. No dysphagia to  solids or liquids. No significant heartburn or reflux. No hematochezia, hematemesis, coffee ground emesis. No evidence of prior gastric/peptic ulceration.  (Review of systems as stated in this history (HPI) or in the review of systems. 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 Strong famiy history. Negative Neurology w/u Dr Erling Cruz  Allergies Renee [name], [place]; 12/27/2016 9:15 AM) Albuterol *ANTIASTHMATIC AND BRONCHODILATOR AGENTS*  Penicillin G Potassium *PENICILLINS*  Allergies Reconciled   Medication History Renee [name], RMA; 12/27/2016 9:15 AM) Atorvastatin Calcium (10MG  Tablet, Oral) Active. Azithromycin (250MG  Tablet, Oral) Active. RaNITidine HCl (300MG  Capsule, Oral) Active. Levocetirizine Dihydrochloride (5MG  Tablet, Oral) Active. Fluticasone Propionate (50MCG/ACT Suspension, Nasal) Active. Medications Reconciled  Vitals [employer] RMA; 12/27/2016 9:16 AM) 12/27/2016 9:15 AM Weight: 188.4 lb Height: 66.5in Body Surface Area: 1.96 m Body Mass Index: 29.95 kg/m  Temp.: 98.88F  Pulse: 99 (Regular)  P.OX: 99% (Room air) BP: 1674/102 (Sitting, Left Arm, Standard)       Physical Exam Renee Hector MD; 12/27/2016 9:30 AM) General Mental Status-Alert. General Appearance-Not in acute distress, Not Sickly. Orientation-Oriented X3. Hydration-Well hydrated. Voice-Normal.  Integumentary Global Assessment Upon inspection and palpation of skin surfaces of the - Axillae: non-tender, no inflammation or ulceration, no drainage. and Distribution of scalp and body hair is normal. General Characteristics Temperature - normal warmth is noted.  Head and Neck Head-normocephalic, atraumatic with no lesions or palpable masses. Face Global Assessment - atraumatic, no absence of expression. Neck Global Assessment - no abnormal movements, no  bruit auscultated on the right, no bruit auscultated on the left, no decreased range of motion, non-tender. Trachea-midline. Thyroid Gland Characteristics -  non-tender.  Eye Eyeball - Left-Extraocular movements intact, No Nystagmus. Eyeball - Right-Extraocular movements intact, No Nystagmus. Cornea - Left-No Hazy. Cornea - Right-No Hazy. Sclera/Conjunctiva - Left-No scleral icterus, No Discharge. Sclera/Conjunctiva - Right-No scleral icterus, No Discharge. Pupil - Left-Direct reaction to light normal. Pupil - Right-Direct reaction to light normal.  ENMT Ears Pinna - Left - no drainage observed, no generalized tenderness observed. Right - no drainage observed, no generalized tenderness observed. Nose and Sinuses External Inspection of the Nose - no destructive lesion observed. Inspection of the nares - Left - quiet respiration. Right - quiet respiration. Mouth and Throat Lips - Upper Lip - no fissures observed, no pallor noted. Lower Lip - no fissures observed, no pallor noted. Nasopharynx - no discharge present. Oral Cavity/Oropharynx - Tongue - no dryness observed. Oral Mucosa - no cyanosis observed. Hypopharynx - no evidence of airway distress observed. Note: Poor hearing out of right ear.  Significant neck tremor with spasmic jerking movements of head & neck   Chest and Lung Exam Inspection Movements - Normal and Symmetrical. Accessory muscles - No use of accessory muscles in breathing. Palpation Palpation of the chest reveals - Non-tender. Auscultation Breath sounds - Normal and Clear.  Cardiovascular Auscultation Rhythm - Regular. Murmurs & Other Heart Sounds - Auscultation of the heart reveals - No Murmurs and No Systolic Clicks.  Abdomen Inspection Inspection of the abdomen reveals - No Visible peristalsis and No Abnormal pulsations. Umbilicus - No Bleeding, No Urine drainage. Palpation/Percussion Palpation and Percussion of the abdomen reveal -  Soft, Non Tender, No Rebound tenderness, No Rigidity (guarding) and No Cutaneous hyperesthesia. Note: Abdomen soft. Not distended. No distasis recti. No umbilical or other anterior abdominal wall hernias   Female Genitourinary Sexual Maturity Tanner 5 - Adult hair pattern. Note: No vaginal bleeding nor discharge   Rectal Note: Deferred given recent colonoscopy   Peripheral Vascular Upper Extremity Inspection - Left - No Cyanotic nailbeds, Not Ischemic. Right - No Cyanotic nailbeds, Not Ischemic.  Neurologic Neurologic evaluation reveals -normal attention span and ability to concentrate, able to name objects and repeat phrases. Appropriate fund of knowledge , normal sensation and normal coordination. Mental Status Affect - not angry, not paranoid. Cranial Nerves-Normal Bilaterally. Gait-Normal.  Neuropsychiatric Mental status exam performed with findings of-able to articulate well with normal speech/language, rate, volume and coherence, thought content normal with ability to perform basic computations and apply abstract reasoning and no evidence of hallucinations, delusions, obsessions or homicidal/suicidal ideation.  Musculoskeletal Global Assessment Spine, Ribs and Pelvis - no instability, subluxation or laxity. Right Upper Extremity - no instability, subluxation or laxity.  Lymphatic Head & Neck  General Head & Neck Lymphatics: Bilateral - Description - No Localized lymphadenopathy. Axillary  General Axillary Region: Bilateral - Description - No Localized lymphadenopathy. Femoral & Inguinal  Generalized Femoral & Inguinal Lymphatics: Left - Description - No Localized lymphadenopathy. Right - Description - No Localized lymphadenopathy.    Assessment & Plan Renee Hector MD; 12/27/2016 9:46 AM) POLYP OF SIGMOID COLON, UNSPECIFIED TYPE (D12.5) Impression: Atypical. Polyp in sigmoid region. Polyps he is inconclusive but visual inspection suspicious by  gastroenterologist. Given the fact it is not felt to be safely removed endoscopically, would recommend segmental resection to make sure that there is no cancer within it since it is a rather large size. Reasonable for a minimally invasive technique. Patient also has associated significant diverticulosis with stricture nearby as well. We'll be good idea to remove the region and regroup.  She  is definitely interested in proceeding. Hoping maybe do it after her birthday next month. 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 Presumed to be benign but required pediatric colonoscope. Maybe explanation for intermittent abdominal bloating and lower abdominal pains. Would plan resection concurrent with polyp nearby SIGMOID DIVERTICULOSIS (K57.30) Current Plans Pt Education - [facility] Diverticular Disease (AT) PREOP COLON - ENCOUNTER FOR PREOPERATIVE EXAMINATION FOR GENERAL SURGICAL PROCEDURE (Z01.818) Current Plans You are being scheduled for surgery- Our schedulers will call you.  You should hear from our office's scheduling department within 5 working days about the location, date, and time of surgery. We try to make accommodations for patient's preferences in scheduling surgery, but sometimes the OR schedule or the surgeon's schedule prevents [place] from making those accommodations.  If you have not heard from our office [phone]) in 5 working days, call the office and ask for your surgeon's nurse.  If you have other questions about your diagnosis, plan, or surgery, call the office and ask for your surgeon's nurse.  Written instructions provided Pt Education - Pamphlet Given - Laparoscopic Colorectal Surgery: discussed with patient and provided information. Pt Education - [facility] Colon Bowel Prep 2015 Miralax/Antibiotics Started Neomycin Sulfate 500MG , 2 (two) Tablet SEE NOTE, #6, 12/27/2016, No Refill. Local Order: TAKE TWO TABLETS AT 2 PM, 3 PM, AND 10 PM THE DAY PRIOR TO  SURGERY Started Flagyl 500MG , 2 (two) Tablet SEE NOTE, #6, 12/27/2016, No Refill. Local Order: Take at 2pm, 3pm, and 10pm the day prior to your colon operation The anatomy & physiology of the digestive tract was discussed. The pathophysiology of the colon was discussed. Natural history risks without surgery was discussed. I feel the risks of no intervention will lead to serious problems that outweigh the operative risks; therefore, I recommended a partial colectomy to remove the pathology. Minimally invasive (Robotic/Laparoscopic) & open techniques were discussed.  Risks such as bleeding, infection, abscess, leak, reoperation, possible ostomy, hernia, heart attack, death, and other risks were discussed. I noted a good likelihood this will help address the problem. Goals of post-operative recovery were discussed as well. Need for adequate nutrition, daily bowel regimen and healthy physical activity, to optimize recovery was noted as well. We will work to minimize complications. Educational materials were available as well. Questions were answered. The patient expresses understanding & wishes to proceed with surgery.  Pt Education - [facility] Colectomy post-op instructions: discussed with patient and provided information.  Renee [name], M.D., F.A.C.S. Gastrointestinal and Minimally Invasive Surgery [facility] [address] [place], [place] [address] [phone] Main / Paging

## 2016-12-28 ENCOUNTER — Other Ambulatory Visit (INDEPENDENT_AMBULATORY_CARE_PROVIDER_SITE_OTHER): Payer: Medicare Other

## 2016-12-28 DIAGNOSIS — E782 Mixed hyperlipidemia: Secondary | ICD-10-CM

## 2016-12-28 DIAGNOSIS — R739 Hyperglycemia, unspecified: Secondary | ICD-10-CM

## 2016-12-28 DIAGNOSIS — I10 Essential (primary) hypertension: Secondary | ICD-10-CM | POA: Diagnosis not present

## 2016-12-28 LAB — LIPID PANEL
CHOLESTEROL: 194 mg/dL (ref 0–200)
HDL: 68.9 mg/dL (ref >39.00)
LDL Cholesterol: 110 mg/dL — ABNORMAL HIGH (ref 0–99)
NONHDL: 124.8
TRIGLYCERIDES: 74 mg/dL (ref 0.0–149.0)
Total CHOL/HDL Ratio: 3
VLDL: 14.8 mg/dL (ref 0.0–40.0)

## 2016-12-28 LAB — COMPREHENSIVE METABOLIC PANEL
ALK PHOS: 72 U/L (ref 39–117)
ALT: 17 U/L (ref 0–35)
AST: 18 U/L (ref 0–37)
Albumin: 3.9 g/dL (ref 3.5–5.2)
BUN: 11 mg/dL (ref 6–23)
CALCIUM: 8.8 mg/dL (ref 8.4–10.5)
CO2: 25 mEq/L (ref 19–32)
Chloride: 106 mEq/L (ref 96–112)
Creatinine, Ser: 0.68 mg/dL (ref 0.40–1.20)
GFR: 90.68 mL/min (ref >60.00)
GLUCOSE: 111 mg/dL — AB (ref 70–99)
POTASSIUM: 4 meq/L (ref 3.5–5.1)
Sodium: 138 mEq/L (ref 135–145)
TOTAL PROTEIN: 6.7 g/dL (ref 6.0–8.3)
Total Bilirubin: 0.6 mg/dL (ref 0.2–1.2)

## 2016-12-28 LAB — CBC
HCT: 44.9 % (ref 36.0–46.0)
Hemoglobin: 14.9 g/dL (ref 12.0–15.0)
MCHC: 33.2 g/dL (ref 30.0–36.0)
MCV: 101.3 fl — ABNORMAL HIGH (ref 78.0–100.0)
Platelets: 314 10*3/uL (ref 150.0–400.0)
RBC: 4.43 Mil/uL (ref 3.87–5.11)
RDW: 14.6 % (ref 11.5–15.5)
WBC: 6.2 10*3/uL (ref 4.0–10.5)

## 2016-12-28 LAB — TSH: TSH: 1.07 u[IU]/mL (ref 0.35–4.50)

## 2016-12-28 LAB — HEMOGLOBIN A1C: Hgb A1c MFr Bld: 6 % (ref 4.6–6.5)

## 2017-01-03 ENCOUNTER — Encounter: Payer: Self-pay | Admitting: Family Medicine

## 2017-01-03 ENCOUNTER — Ambulatory Visit (INDEPENDENT_AMBULATORY_CARE_PROVIDER_SITE_OTHER): Payer: Medicare Other | Admitting: Family Medicine

## 2017-01-03 VITALS — BP 158/90 | HR 83 | Temp 98.3°F | Resp 18 | Wt 191.0 lb

## 2017-01-03 DIAGNOSIS — I10 Essential (primary) hypertension: Secondary | ICD-10-CM | POA: Diagnosis not present

## 2017-01-03 DIAGNOSIS — E6609 Other obesity due to excess calories: Secondary | ICD-10-CM | POA: Diagnosis not present

## 2017-01-03 DIAGNOSIS — R739 Hyperglycemia, unspecified: Secondary | ICD-10-CM | POA: Diagnosis not present

## 2017-01-03 DIAGNOSIS — E782 Mixed hyperlipidemia: Secondary | ICD-10-CM | POA: Diagnosis not present

## 2017-01-03 DIAGNOSIS — Z8739 Personal history of other diseases of the musculoskeletal system and connective tissue: Secondary | ICD-10-CM | POA: Diagnosis not present

## 2017-01-03 DIAGNOSIS — K635 Polyp of colon: Secondary | ICD-10-CM

## 2017-01-03 DIAGNOSIS — D125 Benign neoplasm of sigmoid colon: Secondary | ICD-10-CM

## 2017-01-03 MED ORDER — METOPROLOL SUCCINATE ER 25 MG PO TB24: 25.0000 mg | ORAL_TABLET | Freq: Every day | ORAL | 2 refills | Status: DC

## 2017-01-03 NOTE — Assessment & Plan Note (Signed)
Has surgery scheduled for 02/09/2017 with Dr Johney Maine.

## 2017-01-03 NOTE — Assessment & Plan Note (Signed)
Encouraged to get adequate exercise, calcium and vitamin d intake 

## 2017-01-03 NOTE — Progress Notes (Signed)
Subjective:  I acted as a Education administrator for Dr. Charlett Blake. [name], [place]   Patient ID: Renee [name], female    DOB: 01-20-1946, 71 y.o.   MRN: [id number]  Chief Complaint  Patient presents with  . Follow-up  . Hypertension  . Hyperlipidemia    HPI  Patient is in today for a 3 month follow up. She is following up on HTN, hyperlipidemia and other medical concerns. Patient will have surgery, she has a polyp and dz w/ diverticulosis. Patient has no acute concerns. She feels well. She does note occasional right lower quadrant pain status post her colonoscopy but it is not severe or worsening. She is moving her bowels daily although she notes the bowel movement is smaller and less complete than previous. When she first moves her bowels each morning there is a scant amount of blood on the tissue but it does not recur throughout the day. No fevers, chills, anorexia. She has her surgery scheduled for late June with Dr. Johney Maine. Denies CP/palp/SOB/HA/congestion/fevers or GU c/o. Taking meds as prescribed  Patient Care Team: [name], MD as PCP - General (Family Medicine) Melissa Montane, MD as Consulting Physician (Otolaryngology) Gatha Mayer, MD as Consulting Physician (Gastroenterology) Dr Shanon Brow Daughtry (Optometry) Michael Boston, MD as Consulting Physician (General Surgery) Love, Alyson Locket, MD as Consulting Physician (Neurology)   Past Medical History:  Diagnosis Date  . Allergic rhinitis   . 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 GERD (gastroesophageal reflux disease)   . H/O tobacco use, presenting hazards to health 09/19/2008   Qualifier: Diagnosis of  By: Wynona Luna Smoking roughly 5 cig daily  Last cigarette was in December of 2016  . 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 Hyperglycemia 10/07/2016  . Hyperlipidemia   . Hypertension   . Obesity 07/06/2016  . Occult blood positive  stool   . Polyp of sigmoid colon 12/08/2016   11/2016 - Unable to remove or get bx at colonoscopy. Fixed sigmoid colon prevents. Referred for surgery.  . Preventative health care 05/12/2013  . Tobacco abuse   . Tremor, essential     Past Surgical History:  Procedure Laterality Date  . INNER EAR SURGERY  2015   with tube  . MOUTH SURGERY  01/22/11   lower denture secured by titanium bolts  . rectal suction biospy      Family History  Problem Relation Age of Onset  . Heart attack Father 22       deceased  . Stroke Mother   . Hypertension Mother   . Alcohol abuse Mother   . Obesity Daughter   . Heart disease Paternal Grandmother        AAA rupture  . Heart disease Paternal Grandfather        MI  . Colon cancer Neg Hx   . Breast cancer Neg Hx   . Prostate cancer Neg Hx   . Diabetes Neg Hx     Social History   Social History  . Marital status: Married    Spouse name: N/A  . Number of children: N/A  . Years of education: N/A   Occupational History  . Not on file.   Social History Main Topics  . Smoking status: Former Smoker    Packs/day: 0.50    Types: Cigarettes    Quit  date: 09/07/2015  . Smokeless tobacco: Never Used  . Alcohol use 8.4 oz/week    14 Glasses of wine per week     Comment: 5 glasses of wine/day on weekends  . Drug use: No  . Sexual activity: Not Currently   Other Topics Concern  . Not on file   Social History Narrative  . No narrative on file    Outpatient Medications Prior to Visit  Medication Sig Dispense Refill  . atorvastatin (LIPITOR) 10 MG tablet Take 1 tab po q Tues and Sat. 10 tablet 3  . b complex vitamins tablet Take 1 tablet by mouth every other day.    . Cholecalciferol (VITAMIN D PO) Take by mouth daily.    . Multiple Vitamin (MULTIVITAMIN) tablet Take 1 tablet by mouth daily.    . Probiotic Product (PROBIOTIC-10 PO) Take 1 capsule by mouth daily. Takes 2 a week not daily    . ranitidine (ZANTAC) 300 MG capsule Take 1 capsule  (300 mg total) by mouth every evening. (Patient taking differently: Take 300 mg by mouth as needed. Only as needed) 30 capsule 3  . azithromycin (ZITHROMAX) 250 MG tablet Take 2 tablets by mouth on day 1, followed by 1 tablet by mouth daily for 4 days. 6 tablet 0  . benzonatate (TESSALON) 100 MG capsule Take 1 capsule (100 mg total) by mouth 3 (three) times daily as needed for cough. 21 capsule 0  . fluticasone (FLONASE) 50 MCG/ACT nasal spray Place 2 sprays into both nostrils daily. 16 g 1  . levocetirizine (XYZAL) 5 MG tablet Take 1 tablet (5 mg total) by mouth every evening. 30 tablet 0  . 0.9 %  sodium chloride infusion      No facility-administered medications prior to visit.     Allergies  Allergen Reactions  . Amoxicillin     REACTION: TROUBLE BREATHING  . Proair Hfa [Albuterol] Other (See Comments)    Hypersensitivity, anxious, disoriented  . Penicillins Rash    Review of Systems  Constitutional: Negative for fever and malaise/fatigue.  HENT: Negative for congestion.   Eyes: Negative for blurred vision.  Respiratory: Negative for shortness of breath.   Cardiovascular: Negative for chest pain, palpitations and leg swelling.  Gastrointestinal: Positive for abdominal pain, blood in stool and constipation. Negative for nausea.  Genitourinary: Negative for dysuria and frequency.  Musculoskeletal: Negative for falls.  Skin: Negative for rash.  Neurological: Negative for dizziness, loss of consciousness and headaches.  Endo/Heme/Allergies: Negative for environmental allergies.  Psychiatric/Behavioral: Negative for depression. The patient is not nervous/anxious.        Objective:    Physical Exam  Constitutional: She is oriented to person, place, and time. She appears well-developed and well-nourished. No distress.  HENT:  Head: Normocephalic and atraumatic.  Nose: Nose normal.  Eyes: Right eye exhibits no discharge. Left eye exhibits no discharge.  Neck: Normal range of  motion. Neck supple.  Cardiovascular: Normal rate and regular rhythm.   No murmur heard. Pulmonary/Chest: Effort normal and breath sounds normal.  Abdominal: Soft. Bowel sounds are normal. There is no tenderness.  Musculoskeletal: She exhibits no edema.  Neurological: She is alert and oriented to person, place, and time.  Skin: Skin is warm and dry.  Psychiatric: She has a normal mood and affect.  Nursing note and vitals reviewed.   BP (!) 158/90 (BP Location: Left Arm, Patient Position: Sitting, Cuff Size: Normal)   Pulse 83   Temp 98.3 F (36.8 C) (Oral)  Resp 18   Wt 191 lb (86.6 kg)   SpO2 96%   BMI 31.78 kg/m  Wt Readings from Last 3 Encounters:  01/03/17 191 lb (86.6 kg)  12/23/16 186 lb 12.8 oz (84.7 kg)  12/01/16 191 lb (86.6 kg)   BP Readings from Last 3 Encounters:  01/03/17 (!) 158/90  12/23/16 (!) 153/87  12/01/16 (!) 161/99     Immunization History  Administered Date(s) Administered  . Pneumococcal Conjugate-13 07/06/2016    Health Maintenance  Topic Date Due  . TETANUS/TDAP  01/30/1965  . Hepatitis C Screening  08/15/2017 (Originally [birth year])  . INFLUENZA VACCINE  03/23/2017  . PNA vac Low Risk Adult (2 of 2 - PPSV23) 07/06/2017  . COLONOSCOPY  12/01/2017  . MAMMOGRAM  09/02/2018  . DEXA SCAN  Completed    Lab Results  Component Value Date   WBC 6.2 12/28/2016   HGB 14.9 12/28/2016   HCT 44.9 12/28/2016   PLT 314.0 12/28/2016   GLUCOSE 111 (H) 12/28/2016   CHOL 194 12/28/2016   TRIG 74.0 12/28/2016   HDL 68.90 12/28/2016   LDLDIRECT 148.6 09/26/2008   LDLCALC 110 (H) 12/28/2016   ALT 17 12/28/2016   AST 18 12/28/2016   NA 138 12/28/2016   K 4.0 12/28/2016   CL 106 12/28/2016   CREATININE 0.68 12/28/2016   BUN 11 12/28/2016   CO2 25 12/28/2016   TSH 1.07 12/28/2016   HGBA1C 6.0 12/28/2016    Lab Results  Component Value Date   TSH 1.07 12/28/2016   Lab Results  Component Value Date   WBC 6.2 12/28/2016   HGB 14.9 12/28/2016    HCT 44.9 12/28/2016   MCV 101.3 (H) 12/28/2016   PLT 314.0 12/28/2016   Lab Results  Component Value Date   NA 138 12/28/2016   K 4.0 12/28/2016   CO2 25 12/28/2016   GLUCOSE 111 (H) 12/28/2016   BUN 11 12/28/2016   CREATININE 0.68 12/28/2016   BILITOT 0.6 12/28/2016   ALKPHOS 72 12/28/2016   AST 18 12/28/2016   ALT 17 12/28/2016   PROT 6.7 12/28/2016   ALBUMIN 3.9 12/28/2016   CALCIUM 8.8 12/28/2016   GFR 90.68 12/28/2016   Lab Results  Component Value Date   CHOL 194 12/28/2016   Lab Results  Component Value Date   HDL 68.90 12/28/2016   Lab Results  Component Value Date   LDLCALC 110 (H) 12/28/2016   Lab Results  Component Value Date   TRIG 74.0 12/28/2016   Lab Results  Component Value Date   CHOLHDL 3 12/28/2016   Lab Results  Component Value Date   HGBA1C 6.0 12/28/2016         Assessment & Plan:   Problem List Items Addressed This Visit    Hyperlipidemia, mixed    Tolerating statin, encouraged heart healthy diet, avoid trans fats, minimize simple carbs and saturated fats. Increase exercise as tolerated. Much improved with twice a week Atorvastatin. Questions if her bum muscles have been more sore lately. Will continue to monitor and continue daily Co Q 10      Relevant Medications   metoprolol succinate (TOPROL-XL) 25 MG 24 hr tablet   Other Relevant Orders   Lipid panel   Essential hypertension - Primary    not controlled, no changes to meds. Encouraged heart healthy diet such as the DASH diet and exercise as tolerated.  Start Metoprolol succinate 25 mg po daily RN visit in 2 weeks to check pressure. Avoid  sodium and get adequate sleep      Relevant Medications   metoprolol succinate (TOPROL-XL) 25 MG 24 hr tablet   Other Relevant Orders   CBC   Comprehensive metabolic panel   H/O osteopenia    Encouraged to get adequate exercise, calcium and vitamin d intake      Obesity    Encouraged DASH diet, decrease po intake and increase  exercise as tolerated. Needs 7-8 hours of sleep nightly. Avoid trans fats, eat small, frequent meals every 4-5 hours with lean proteins, complex carbs and healthy fats. Minimize simple carbs      Hyperglycemia     minimize simple carbs. Increase exercise as tolerated.       Relevant Orders   Hemoglobin A1c   Polyp of sigmoid colon    Has surgery scheduled for 02/09/2017 with Dr Johney Maine.          I have discontinued Ms. Negrete's fluticasone, levocetirizine, azithromycin, and benzonatate. I am also having her start on metoprolol succinate. Additionally, I am having her maintain her multivitamin, b complex vitamins, ranitidine, atorvastatin, Cholecalciferol (VITAMIN D PO), and Probiotic Product (PROBIOTIC-10 PO). We will stop administering sodium chloride.  Meds ordered this encounter  Medications  . metoprolol succinate (TOPROL-XL) 25 MG 24 hr tablet    Sig: Take 1 tablet (25 mg total) by mouth daily.    Dispense:  30 tablet    Refill:  2    CMA served as scribe during this visit. History, Physical and Plan performed by medical provider. Documentation and orders reviewed and attested to.  Penni Homans, MD

## 2017-01-03 NOTE — Assessment & Plan Note (Signed)
not controlled, no changes to meds. Encouraged heart healthy diet such as the DASH diet and exercise as tolerated.  Start Metoprolol succinate 25 mg po daily RN visit in 2 weeks to check pressure. Avoid sodium and get adequate sleep

## 2017-01-03 NOTE — Assessment & Plan Note (Signed)
Tolerating statin, encouraged heart healthy diet, avoid trans fats, minimize simple carbs and saturated fats. Increase exercise as tolerated. Much improved with twice a week Atorvastatin. Questions if her bum muscles have been more sore lately. Will continue to monitor and continue daily Co Q 10

## 2017-01-03 NOTE — Assessment & Plan Note (Signed)
minimize simple carbs. Increase exercise as tolerated.  

## 2017-01-03 NOTE — Assessment & Plan Note (Signed)
Encouraged DASH diet, decrease po intake and increase exercise as tolerated. Needs 7-8 hours of sleep nightly. Avoid trans fats, eat small, frequent meals every 4-5 hours with lean proteins, complex carbs and healthy fats. Minimize simple carbs 

## 2017-01-03 NOTE — Patient Instructions (Signed)
Encouraged increased hydration and fiber in diet. Daily probiotics. If bowels not moving can use MOM 2 tbls po in 4 oz of warm prune juice by mouth every 2-3 days. If no results then repeat in 4 hours with  Dulcolax suppository pr, may repeat again in 4 more hours as needed. Seek care if symptoms worsen. Consider daily Miralax and/or Dulcolax if symptoms persist.   Miralax/Benefiber once to twice daily  About Constipation  Constipation Overview Constipation is the most common gastrointestinal complaint - about 4 million Americans experience constipation and make 2.5 million physician visits a year to get help for the problem.  Constipation can occur when the colon absorbs too much water, the colon's muscle contraction is slow or sluggish, and/or there is delayed transit time through the colon.  The result is stool that is hard and dry.  Indicators of constipation include straining during bowel movements greater than 25% of the time, having fewer than three bowel movements per week, and/or the feeling of incomplete evacuation.  There are established guidelines ([place] II ) for defining constipation. A person needs to have two or more of the following symptoms for at least 12 weeks (not necessarily consecutive) in the preceding 12 months: . Straining in  greater than 25% of bowel movements . Lumpy or hard stools in greater than 25% of bowel movements . Sensation of incomplete emptying in greater than 25% of bowel movements . Sensation of anorectal obstruction/blockade in greater than 25% of bowel movements . Manual maneuvers to help empty greater than 25% of bowel movements (e.g., digital evacuation, support of the pelvic floor)  . Less than  3 bowel movements/week . Loose stools are not present, and criteria for irritable bowel syndrome are insufficient  Common Causes of Constipation . Lack of fiber in your diet . Lack of physical activity . Medications, including iron and calcium supplements   . Dairy intake . Dehydration . Abuse of laxatives  Travel  Irritable Bowel Syndrome  Pregnancy  Luteal phase of menstruation (after ovulation and before menses)  Colorectal problems  Intestinal Dysfunction  Treating Constipation  There are several ways of treating constipation, including changes to diet and exercise, use of laxatives, adjustments to the pelvic floor, and scheduled toileting.  These treatments include: . increasing fiber and fluids in the diet  . increasing physical activity . learning muscle coordination   learning proper toileting techniques and toileting modifications   designing and sticking  to a toileting schedule     2007, [employer] Doc.22

## 2017-01-18 ENCOUNTER — Ambulatory Visit (INDEPENDENT_AMBULATORY_CARE_PROVIDER_SITE_OTHER): Payer: Medicare Other | Admitting: Family Medicine

## 2017-01-18 VITALS — BP 135/73 | HR 73

## 2017-01-18 DIAGNOSIS — I10 Essential (primary) hypertension: Secondary | ICD-10-CM | POA: Diagnosis not present

## 2017-01-18 NOTE — Progress Notes (Signed)
Nurseblood pressure check note reviewed. Agree with documention and plan. 

## 2017-01-18 NOTE — Progress Notes (Signed)
Patient in for BP check per order from Dr. Charlett Blake dated 01/03/17.  Patient started on Metoprolol 25 mg daily. Patient states she just started taking 1 week ago.  BP today = 135/73 P=73  Patient states she will have surgery on June 20th. Today complains of allergy symptoms and a little chest tightness states she is a little nauseous from not eating this am but no SOB. Patient denies pressure feeling on chest.   Per Dr. Charlett Blake patient will need to be seen in ED if chest tightness continues and patient has pressure in chest along with SOB and nausea. No changes in BP medication orders.   Patient agrees to go to ED if symptoms continue or worsen. Nurse blood pressure check note reviewed. Agree with documention and plan. Marland Kitchen

## 2017-01-20 ENCOUNTER — Other Ambulatory Visit: Payer: Self-pay

## 2017-01-20 NOTE — Patient Outreach (Signed)
[facility]) Care Management  01/20/2017  Renee [name] 01/01/1946 [id number]     Medication Adherence call to Renee [name] call Renee [name] because she is past due on her atorvastatin 10 mg she said she might not need it  she is having surgery in a couple of days and she will discuss with the doctor to make sure she will continue on medication she said she was going to order a couple of Renee [name] until surgery.     [facility] Management Direct Dial [phone]  Fax [phone] [name]

## 2017-01-31 ENCOUNTER — Encounter: Payer: Self-pay | Admitting: Family Medicine

## 2017-01-31 NOTE — Progress Notes (Signed)
Consent for surgery has asterisks in the procedure part and the reason part.  Please correct consent. Thanks.

## 2017-01-31 NOTE — Patient Instructions (Addendum)
CIARAH PEACE  01/31/2017   Your procedure is scheduled on: 02/09/2017    Report to [facility] Main  Entrance Take [place]  elevators to 3rd floor to  [place]    AM.    Call this number if you have problems the morning of surgery [phone]    Remember: ONLY 1 PERSON MAY GO WITH YOU TO SHORT STAY TO GET  READY MORNING OF [place].  Do not eat food or drink liquids :After Midnight.             Clear liquid diet on day of bowel prep      Take these medicines the morning of surgery with A SIP OF WATER: metoprolol ( Toprol), Zantac                                 You may not have any metal on your body including hair pins and              piercings  Do not wear jewelry, make-up, lotions, powders or perfumes, deodorant             Do not wear nail polish.  Do not shave  48 hours prior to surgery.     Do not bring valuables to the hospital. [place].  Contacts, dentures or bridgework may not be worn into surgery.  Leave suitcase in the car. After surgery it may be brought to your room.                     Please read over the following fact sheets you were given: _____________________________________________________________________                CLEAR LIQUID DIET   Foods Allowed                                                                     Foods Excluded  Coffee and tea, regular and decaf                             liquids that you cannot  Plain Jell-O in any flavor                                             see through such as: Fruit ices (not with fruit pulp)                                     milk, soups, orange juice  Iced Popsicles                                    All  solid food Carbonated beverages, regular and diet                                    Cranberry, grape and apple juices Sports drinks like Gatorade Lightly seasoned clear broth or consume(fat  free) Sugar, honey syrup  Sample Menu Breakfast                                Lunch                                     Supper Cranberry juice                    Beef broth                            Chicken broth Jell-O                                     Grape juice                           Apple juice Coffee or tea                        Jell-O                                      Popsicle                                                Coffee or tea                        Coffee or tea  _____________________________________________________________________  [facility] - Preparing for Surgery Before surgery, you can play an important role.  Because skin is not sterile, your skin needs to be as free of germs as possible.  You can reduce the number of germs on your skin by washing with CHG (chlorahexidine gluconate) soap before surgery.  CHG is an antiseptic cleaner which kills germs and bonds with the skin to continue killing germs even after washing. Please DO NOT use if you have an allergy to CHG or antibacterial soaps.  If your skin becomes reddened/irritated stop using the CHG and inform your nurse when you arrive at Short Stay. Do not shave (including legs and underarms) for at least 48 hours prior to the first CHG shower.  You may shave your face/neck. Please follow these instructions carefully:  1.  Shower with CHG Soap the night before surgery and the  morning of Surgery.  2.  If you choose to wash your hair, wash your hair first as usual with your  normal  shampoo.  3.  After you shampoo, rinse your hair and body thoroughly to remove the  shampoo.  4.  Use CHG as you would any other liquid soap.  You can apply chg directly  to the skin and wash                       Gently with a scrungie or clean washcloth.  5.  Apply the CHG Soap to your body ONLY FROM THE NECK DOWN.   Do not use on face/ open                           Wound or open sores. Avoid contact  with eyes, ears mouth and genitals (private parts).                       Wash face,  Genitals (private parts) with your normal soap.             6.  Wash thoroughly, paying special attention to the area where your surgery  will be performed.  7.  Thoroughly rinse your body with warm water from the neck down.  8.  DO NOT shower/wash with your normal soap after using and rinsing off  the CHG Soap.                9.  Pat yourself dry with a clean towel.            10.  Wear clean pajamas.            11.  Place clean sheets on your bed the night of your first shower and do not  sleep with pets. Day of Surgery : Do not apply any lotions/deodorants the morning of surgery.  Please wear clean clothes to the hospital/surgery center.  FAILURE TO FOLLOW THESE INSTRUCTIONS MAY RESULT IN THE CANCELLATION OF YOUR SURGERY PATIENT SIGNATURE_________________________________  NURSE SIGNATURE__________________________________  ________________________________________________________________________  WHAT IS A BLOOD TRANSFUSION? Blood Transfusion Information  A transfusion is the replacement of blood or some of its parts. Blood is made up of multiple cells which provide different functions.  Red blood cells carry oxygen and are used for blood loss replacement.  White blood cells fight against infection.  Platelets control bleeding.  Plasma helps clot blood.  Other blood products are available for specialized needs, such as hemophilia or other clotting disorders. BEFORE THE TRANSFUSION  Who gives blood for transfusions?   Healthy volunteers who are fully evaluated to make sure their blood is safe. This is blood bank blood. Transfusion therapy is the safest it has ever been in the practice of medicine. Before blood is taken from a donor, a complete history is taken to make sure that person has no history of diseases nor engages in risky social behavior (examples are intravenous drug use or sexual  activity with multiple partners). The donor's travel history is screened to minimize risk of transmitting infections, such as malaria. The donated blood is tested for signs of infectious diseases, such as HIV and hepatitis. The blood is then tested to be sure it is compatible with you in order to minimize the chance of a transfusion reaction. If you or a relative donates blood, this is often done in anticipation of surgery and is not appropriate for emergency situations. It takes many days to process the donated blood. RISKS AND COMPLICATIONS Although transfusion therapy is very safe and saves many lives, the main dangers of transfusion include:   Getting an infectious disease.  Developing a transfusion reaction.  This is an allergic reaction to something in the blood you were given. Every precaution is taken to prevent this. The decision to have a blood transfusion has been considered carefully by your caregiver before blood is given. Blood is not given unless the benefits outweigh the risks. AFTER THE TRANSFUSION  Right after receiving a blood transfusion, you will usually feel much better and more energetic. This is especially true if your red blood cells have gotten low (anemic). The transfusion raises the level of the red blood cells which carry oxygen, and this usually causes an energy increase.  The nurse administering the transfusion will monitor you carefully for complications. HOME CARE INSTRUCTIONS  No special instructions are needed after a transfusion. You may find your energy is better. Speak with your caregiver about any limitations on activity for underlying diseases you may have. SEEK MEDICAL CARE IF:   Your condition is not improving after your transfusion.  You develop redness or irritation at the intravenous (IV) site. SEEK IMMEDIATE MEDICAL CARE IF:  Any of the following symptoms occur over the next 12 hours:  Shaking chills.  You have a temperature by mouth above 102 F  (38.9 C), not controlled by medicine.  Chest, back, or muscle pain.  People around you feel you are not acting correctly or are confused.  Shortness of breath or difficulty breathing.  Dizziness and fainting.  You get a rash or develop hives.  You have a decrease in urine output.  Your urine turns a dark color or changes to pink, red, or brown. Any of the following symptoms occur over the next 10 days:  You have a temperature by mouth above 102 F (38.9 C), not controlled by medicine.  Shortness of breath.  Weakness after normal activity.  The white part of the eye turns yellow (jaundice).  You have a decrease in the amount of urine or are urinating less often.  Your urine turns a dark color or changes to pink, red, or brown. Document Released: 08/06/2000 Document Revised: 11/01/2011 Document Reviewed: 03/25/2008 [facility] Patient Information 2014 [place], [place].  _______________________________________________________________________

## 2017-02-02 ENCOUNTER — Encounter (HOSPITAL_COMMUNITY)
Admission: RE | Admit: 2017-02-02 | Discharge: 2017-02-02 | Disposition: A | Discharge status: Home / Self Care | Payer: Medicare Other | Source: Ambulatory Visit | Attending: Surgery | Admitting: Surgery

## 2017-02-02 ENCOUNTER — Encounter (HOSPITAL_COMMUNITY): Payer: Self-pay

## 2017-02-02 DIAGNOSIS — H7191 Unspecified cholesteatoma, right ear: Secondary | ICD-10-CM | POA: Diagnosis not present

## 2017-02-02 DIAGNOSIS — Z01810 Encounter for preprocedural cardiovascular examination: Secondary | ICD-10-CM | POA: Insufficient documentation

## 2017-02-02 DIAGNOSIS — Z01812 Encounter for preprocedural laboratory examination: Secondary | ICD-10-CM | POA: Diagnosis not present

## 2017-02-02 DIAGNOSIS — E785 Hyperlipidemia, unspecified: Secondary | ICD-10-CM | POA: Diagnosis not present

## 2017-02-02 DIAGNOSIS — K219 Gastro-esophageal reflux disease without esophagitis: Secondary | ICD-10-CM | POA: Diagnosis not present

## 2017-02-02 DIAGNOSIS — R739 Hyperglycemia, unspecified: Secondary | ICD-10-CM | POA: Insufficient documentation

## 2017-02-02 DIAGNOSIS — Z87891 Personal history of nicotine dependence: Secondary | ICD-10-CM | POA: Diagnosis not present

## 2017-02-02 DIAGNOSIS — E669 Obesity, unspecified: Secondary | ICD-10-CM | POA: Insufficient documentation

## 2017-02-02 DIAGNOSIS — I10 Essential (primary) hypertension: Secondary | ICD-10-CM | POA: Insufficient documentation

## 2017-02-02 HISTORY — DX: Nausea with vomiting, unspecified: R11.2

## 2017-02-02 HISTORY — DX: Myoneural disorder, unspecified: G70.9

## 2017-02-02 HISTORY — DX: Nausea with vomiting, unspecified: Z98.890

## 2017-02-02 HISTORY — DX: Concussion with loss of consciousness of unspecified duration, initial encounter: S06.0X9A

## 2017-02-02 LAB — CBC
HEMATOCRIT: 43.8 % (ref 36.0–46.0)
HEMOGLOBIN: 15.1 g/dL — AB (ref 12.0–15.0)
MCH: 33.7 pg (ref 26.0–34.0)
MCHC: 34.5 g/dL (ref 30.0–36.0)
MCV: 97.8 fL (ref 78.0–100.0)
Platelets: 232 10*3/uL (ref 150–400)
RBC: 4.48 MIL/uL (ref 3.87–5.11)
RDW: 14.3 % (ref 11.5–15.5)
WBC: 6.2 10*3/uL (ref 4.0–10.5)

## 2017-02-02 LAB — BASIC METABOLIC PANEL
ANION GAP: 8 (ref 5–15)
BUN: 16 mg/dL (ref 6–20)
CHLORIDE: 105 mmol/L (ref 101–111)
CO2: 24 mmol/L (ref 22–32)
Calcium: 9.1 mg/dL (ref 8.9–10.3)
Creatinine, Ser: 0.7 mg/dL (ref 0.44–1.00)
GFR calc non Af Amer: >60 mL/min (ref >60)
Glucose, Bld: 113 mg/dL — ABNORMAL HIGH (ref 65–99)
Potassium: 4.9 mmol/L (ref 3.5–5.1)
Sodium: 137 mmol/L (ref 135–145)

## 2017-02-02 LAB — ABO/RH: ABO/RH(D): A POS

## 2017-02-02 NOTE — Progress Notes (Signed)
Final EKG done 02/02/17-epic

## 2017-02-03 LAB — HEMOGLOBIN A1C
Hgb A1c MFr Bld: 5.7 % — ABNORMAL HIGH (ref 4.8–5.6)
Mean Plasma Glucose: 117 mg/dL

## 2017-02-05 ENCOUNTER — Other Ambulatory Visit: Payer: Self-pay | Admitting: Family Medicine

## 2017-02-07 ENCOUNTER — Ambulatory Visit: Payer: Self-pay | Admitting: Surgery

## 2017-02-08 MED ORDER — GENTAMICIN SULFATE 40 MG/ML IJ SOLN
5.0000 mg/kg | INTRAVENOUS | Status: AC
  Administered 2017-02-09: 340 mg via INTRAVENOUS
  Filled 2017-02-08: qty 8.5

## 2017-02-09 ENCOUNTER — Inpatient Hospital Stay (HOSPITAL_COMMUNITY)
Admission: RE | Admit: 2017-02-09 | Discharge: 2017-02-12 | DRG: 330 | Disposition: A | Discharge status: Home / Self Care | Payer: Medicare Other | Source: Ambulatory Visit | Attending: Surgery | Admitting: Surgery

## 2017-02-09 ENCOUNTER — Inpatient Hospital Stay (HOSPITAL_COMMUNITY): Payer: Medicare Other | Admitting: Certified Registered Nurse Anesthetist

## 2017-02-09 ENCOUNTER — Encounter (HOSPITAL_COMMUNITY)
Admission: RE | Disposition: A | Discharge status: Home / Self Care | Payer: Self-pay | Source: Ambulatory Visit | Attending: Surgery

## 2017-02-09 ENCOUNTER — Encounter (HOSPITAL_COMMUNITY): Payer: Self-pay | Admitting: Certified Registered Nurse Anesthetist

## 2017-02-09 DIAGNOSIS — K5732 Diverticulitis of large intestine without perforation or abscess without bleeding: Secondary | ICD-10-CM | POA: Diagnosis present

## 2017-02-09 DIAGNOSIS — K5720 Diverticulitis of large intestine with perforation and abscess without bleeding: Secondary | ICD-10-CM | POA: Diagnosis not present

## 2017-02-09 DIAGNOSIS — D125 Benign neoplasm of sigmoid colon: Secondary | ICD-10-CM | POA: Diagnosis present

## 2017-02-09 DIAGNOSIS — K56699 Other intestinal obstruction unspecified as to partial versus complete obstruction: Secondary | ICD-10-CM | POA: Insufficient documentation

## 2017-02-09 DIAGNOSIS — Z881 Allergy status to other antibiotic agents status: Secondary | ICD-10-CM | POA: Diagnosis not present

## 2017-02-09 DIAGNOSIS — Z6829 Body mass index (BMI) 29.0-29.9, adult: Secondary | ICD-10-CM | POA: Diagnosis not present

## 2017-02-09 DIAGNOSIS — Z880 Allergy status to penicillin: Secondary | ICD-10-CM | POA: Diagnosis not present

## 2017-02-09 DIAGNOSIS — K219 Gastro-esophageal reflux disease without esophagitis: Secondary | ICD-10-CM | POA: Diagnosis present

## 2017-02-09 DIAGNOSIS — I10 Essential (primary) hypertension: Secondary | ICD-10-CM | POA: Diagnosis present

## 2017-02-09 DIAGNOSIS — Z79899 Other long term (current) drug therapy: Secondary | ICD-10-CM

## 2017-02-09 DIAGNOSIS — K624 Stenosis of anus and rectum: Secondary | ICD-10-CM | POA: Diagnosis not present

## 2017-02-09 DIAGNOSIS — E782 Mixed hyperlipidemia: Secondary | ICD-10-CM | POA: Diagnosis present

## 2017-02-09 DIAGNOSIS — G250 Essential tremor: Secondary | ICD-10-CM | POA: Diagnosis not present

## 2017-02-09 DIAGNOSIS — K635 Polyp of colon: Secondary | ICD-10-CM | POA: Diagnosis present

## 2017-02-09 DIAGNOSIS — E669 Obesity, unspecified: Secondary | ICD-10-CM | POA: Diagnosis present

## 2017-02-09 DIAGNOSIS — Z87891 Personal history of nicotine dependence: Secondary | ICD-10-CM

## 2017-02-09 HISTORY — PX: XI ROBOTIC ASSISTED LOWER ANTERIOR RESECTION: SHX6558

## 2017-02-09 HISTORY — DX: Other intestinal obstruction unspecified as to partial versus complete obstruction: K56.699

## 2017-02-09 LAB — TYPE AND SCREEN
ABO/RH(D): A POS
Antibody Screen: NEGATIVE

## 2017-02-09 SURGERY — RESECTION, RECTUM, LOW ANTERIOR, ROBOT-ASSISTED
Anesthesia: General | Site: Abdomen

## 2017-02-09 MED ORDER — HYDROMORPHONE HCL 1 MG/ML IJ SOLN
0.5000 mg | INTRAMUSCULAR | Status: DC | PRN
  Administered 2017-02-09: 1 mg via INTRAVENOUS
  Filled 2017-02-09: qty 1

## 2017-02-09 MED ORDER — ROCURONIUM BROMIDE 100 MG/10ML IV SOLN
INTRAVENOUS | Status: DC | PRN
  Administered 2017-02-09: 30 mg via INTRAVENOUS
  Administered 2017-02-09: 20 mg via INTRAVENOUS
  Administered 2017-02-09: 50 mg via INTRAVENOUS

## 2017-02-09 MED ORDER — KETAMINE HCL 10 MG/ML IJ SOLN
INTRAMUSCULAR | Status: DC | PRN
  Administered 2017-02-09 (×3): 10 mg via INTRAVENOUS

## 2017-02-09 MED ORDER — METRONIDAZOLE 500 MG PO TABS: 500.0000 mg | ORAL_TABLET | Freq: Three times a day (TID) | ORAL | Status: DC

## 2017-02-09 MED ORDER — FENTANYL CITRATE (PF) 100 MCG/2ML IJ SOLN
INTRAMUSCULAR | Status: DC | PRN
  Administered 2017-02-09: 100 ug via INTRAVENOUS
  Administered 2017-02-09: 50 ug via INTRAVENOUS
  Administered 2017-02-09: 100 ug via INTRAVENOUS

## 2017-02-09 MED ORDER — NEOMYCIN SULFATE 500 MG PO TABS
1000.0000 mg | ORAL_TABLET | ORAL | Status: DC
  Filled 2017-02-09: qty 2

## 2017-02-09 MED ORDER — LACTATED RINGERS IV SOLN: 1000.0000 mL | Freq: Three times a day (TID) | INTRAVENOUS | Status: AC | PRN

## 2017-02-09 MED ORDER — SACCHAROMYCES BOULARDII 250 MG PO CAPS
250.0000 mg | ORAL_CAPSULE | Freq: Two times a day (BID) | ORAL | Status: DC
  Administered 2017-02-09 – 2017-02-12 (×6): 250 mg via ORAL
  Filled 2017-02-09 (×6): qty 1

## 2017-02-09 MED ORDER — ALVIMOPAN 12 MG PO CAPS
12.0000 mg | ORAL_CAPSULE | Freq: Once | ORAL | Status: AC
  Administered 2017-02-09: 12 mg via ORAL
  Filled 2017-02-09: qty 1

## 2017-02-09 MED ORDER — SUGAMMADEX SODIUM 200 MG/2ML IV SOLN
INTRAVENOUS | Status: DC | PRN
  Administered 2017-02-09: 200 mg via INTRAVENOUS

## 2017-02-09 MED ORDER — SODIUM CHLORIDE 0.9 % IV SOLN
INTRAVENOUS | Status: DC
  Administered 2017-02-09: 14:00:00 via INTRAVENOUS

## 2017-02-09 MED ORDER — ENSURE SURGERY PO LIQD
237.0000 mL | Freq: Two times a day (BID) | ORAL | Status: DC
  Administered 2017-02-09 – 2017-02-10 (×3): 237 mL via ORAL
  Filled 2017-02-09 (×7): qty 237

## 2017-02-09 MED ORDER — GENTAMICIN SULFATE 40 MG/ML IJ SOLN
INTRAMUSCULAR | Status: AC
  Administered 2017-02-09: 900 mL via INTRAPERITONEAL
  Filled 2017-02-09: qty 6

## 2017-02-09 MED ORDER — PROCHLORPERAZINE EDISYLATE 5 MG/ML IJ SOLN: 5.0000 mg | INTRAMUSCULAR | Status: DC | PRN

## 2017-02-09 MED ORDER — MENTHOL 3 MG MT LOZG: 1.0000 | LOZENGE | OROMUCOSAL | Status: DC | PRN

## 2017-02-09 MED ORDER — SODIUM CHLORIDE 0.9 % IV SOLN
INTRAVENOUS | Status: AC | PRN
  Administered 2017-02-09: 50 mL

## 2017-02-09 MED ORDER — LACTATED RINGERS IR SOLN
Status: DC | PRN
  Administered 2017-02-09: 1000 mL

## 2017-02-09 MED ORDER — ACETAMINOPHEN 500 MG PO TABS
1000.0000 mg | ORAL_TABLET | Freq: Three times a day (TID) | ORAL | Status: DC
  Administered 2017-02-09 – 2017-02-12 (×9): 1000 mg via ORAL
  Filled 2017-02-09 (×9): qty 2

## 2017-02-09 MED ORDER — DIPHENHYDRAMINE HCL 12.5 MG/5ML PO ELIX
12.5000 mg | ORAL_SOLUTION | Freq: Four times a day (QID) | ORAL | Status: DC | PRN
Start: 2017-02-09 — End: 2017-02-12

## 2017-02-09 MED ORDER — POLYETHYLENE GLYCOL 3350 17 GM/SCOOP PO POWD
1.0000 | Freq: Once | ORAL | Status: DC
  Filled 2017-02-09: qty 255

## 2017-02-09 MED ORDER — PROPOFOL 10 MG/ML IV BOLUS
INTRAVENOUS | Status: DC | PRN
  Administered 2017-02-09: 140 mg via INTRAVENOUS

## 2017-02-09 MED ORDER — ALUM & MAG HYDROXIDE-SIMETH 200-200-20 MG/5ML PO SUSP: 30.0000 mL | Freq: Four times a day (QID) | ORAL | Status: DC | PRN

## 2017-02-09 MED ORDER — DEXAMETHASONE SODIUM PHOSPHATE 4 MG/ML IJ SOLN
INTRAMUSCULAR | Status: DC | PRN
  Administered 2017-02-09: 10 mg via INTRAVENOUS

## 2017-02-09 MED ORDER — METRONIDAZOLE 500 MG PO TABS
1000.0000 mg | ORAL_TABLET | ORAL | Status: DC
  Filled 2017-02-09: qty 2

## 2017-02-09 MED ORDER — SODIUM CHLORIDE 0.9% FLUSH: 3.0000 mL | Freq: Two times a day (BID) | INTRAVENOUS | Status: DC

## 2017-02-09 MED ORDER — HYDROMORPHONE HCL 1 MG/ML IJ SOLN
INTRAMUSCULAR | Status: AC
Start: 2017-02-09 — End: 2017-02-10
  Filled 2017-02-09: qty 0.5

## 2017-02-09 MED ORDER — NEOMYCIN SULFATE 500 MG PO TABS: 500.0000 mg | ORAL_TABLET | Freq: Three times a day (TID) | ORAL | Status: DC

## 2017-02-09 MED ORDER — HYDROMORPHONE HCL 1 MG/ML IJ SOLN
0.2500 mg | INTRAMUSCULAR | Status: DC | PRN
  Administered 2017-02-09 (×2): 0.5 mg via INTRAVENOUS

## 2017-02-09 MED ORDER — FENTANYL CITRATE (PF) 250 MCG/5ML IJ SOLN
INTRAMUSCULAR | Status: AC
  Filled 2017-02-09: qty 5

## 2017-02-09 MED ORDER — BISACODYL 5 MG PO TBEC
20.0000 mg | DELAYED_RELEASE_TABLET | Freq: Once | ORAL | Status: DC
  Filled 2017-02-09: qty 4

## 2017-02-09 MED ORDER — HYDROCORTISONE 2.5 % RE CREA
1.0000 "application " | TOPICAL_CREAM | Freq: Four times a day (QID) | RECTAL | Status: DC | PRN
  Filled 2017-02-09: qty 28.35

## 2017-02-09 MED ORDER — GUAIFENESIN-DM 100-10 MG/5ML PO SYRP: 10.0000 mL | ORAL_SOLUTION | ORAL | Status: DC | PRN

## 2017-02-09 MED ORDER — BUPIVACAINE LIPOSOME 1.3 % IJ SUSP
20.0000 mL | INTRAMUSCULAR | Status: DC
  Filled 2017-02-09: qty 20

## 2017-02-09 MED ORDER — BUPIVACAINE-EPINEPHRINE (PF) 0.25% -1:200000 IJ SOLN
INTRAMUSCULAR | Status: AC
  Filled 2017-02-09: qty 30

## 2017-02-09 MED ORDER — SODIUM CHLORIDE 0.9 % IV SOLN
INTRAVENOUS | Status: DC | PRN
  Administered 2017-02-09: 1000 mL via INTRAPERITONEAL

## 2017-02-09 MED ORDER — ACETAMINOPHEN 500 MG PO TABS
ORAL_TABLET | ORAL | Status: AC
  Filled 2017-02-09: qty 2

## 2017-02-09 MED ORDER — ZOLPIDEM TARTRATE 5 MG PO TABS: 5.0000 mg | ORAL_TABLET | Freq: Every evening | ORAL | Status: DC | PRN

## 2017-02-09 MED ORDER — METOPROLOL TARTRATE 5 MG/5ML IV SOLN: 5.0000 mg | Freq: Four times a day (QID) | INTRAVENOUS | Status: DC | PRN

## 2017-02-09 MED ORDER — PROPOFOL 10 MG/ML IV BOLUS
INTRAVENOUS | Status: AC
Start: 2017-02-09 — End: 2017-02-09
  Filled 2017-02-09: qty 20

## 2017-02-09 MED ORDER — BUPIVACAINE-EPINEPHRINE 0.25% -1:200000 IJ SOLN
INTRAMUSCULAR | Status: DC | PRN
  Administered 2017-02-09: 30 mL

## 2017-02-09 MED ORDER — LIDOCAINE HCL (CARDIAC) 20 MG/ML IV SOLN
INTRAVENOUS | Status: DC | PRN
  Administered 2017-02-09: 50 mg via INTRAVENOUS

## 2017-02-09 MED ORDER — SODIUM CHLORIDE 0.9% FLUSH: 3.0000 mL | INTRAVENOUS | Status: DC | PRN

## 2017-02-09 MED ORDER — PROMETHAZINE HCL 25 MG/ML IJ SOLN: 6.2500 mg | INTRAMUSCULAR | Status: DC | PRN

## 2017-02-09 MED ORDER — SODIUM CHLORIDE 0.9 % IV SOLN: 250.0000 mL | INTRAVENOUS | Status: DC | PRN

## 2017-02-09 MED ORDER — ADULT MULTIVITAMIN W/MINERALS CH
1.0000 | ORAL_TABLET | ORAL | Status: DC
  Administered 2017-02-10 – 2017-02-12 (×2): 1 via ORAL
  Filled 2017-02-09 (×3): qty 1

## 2017-02-09 MED ORDER — COENZYME Q10 200 MG PO CAPS: 200.0000 mg | ORAL_CAPSULE | ORAL | Status: DC

## 2017-02-09 MED ORDER — ACETAMINOPHEN 500 MG PO TABS
1000.0000 mg | ORAL_TABLET | ORAL | Status: AC
  Administered 2017-02-09: 1000 mg via ORAL

## 2017-02-09 MED ORDER — EPHEDRINE SULFATE 50 MG/ML IJ SOLN
INTRAMUSCULAR | Status: DC | PRN
  Administered 2017-02-09 (×2): 10 mg via INTRAVENOUS

## 2017-02-09 MED ORDER — HYDROCORTISONE 1 % EX CREA
1.0000 "application " | TOPICAL_CREAM | Freq: Three times a day (TID) | CUTANEOUS | Status: DC | PRN
  Filled 2017-02-09: qty 28

## 2017-02-09 MED ORDER — CLINDAMYCIN PHOSPHATE 900 MG/50ML IV SOLN
INTRAVENOUS | Status: AC
  Filled 2017-02-09: qty 50

## 2017-02-09 MED ORDER — KETAMINE HCL 10 MG/ML IJ SOLN
INTRAMUSCULAR | Status: AC
Start: 2017-02-09 — End: 2017-02-09
  Filled 2017-02-09: qty 1

## 2017-02-09 MED ORDER — ALVIMOPAN 12 MG PO CAPS
12.0000 mg | ORAL_CAPSULE | Freq: Two times a day (BID) | ORAL | Status: DC
  Filled 2017-02-09 (×2): qty 1

## 2017-02-09 MED ORDER — LACTATED RINGERS IV SOLN
INTRAVENOUS | Status: DC
  Administered 2017-02-09 (×2): via INTRAVENOUS

## 2017-02-09 MED ORDER — LIP MEDEX EX OINT
1.0000 "application " | TOPICAL_OINTMENT | Freq: Two times a day (BID) | CUTANEOUS | Status: DC
  Administered 2017-02-10 – 2017-02-11 (×4): 1 via TOPICAL
  Filled 2017-02-09: qty 7

## 2017-02-09 MED ORDER — ENOXAPARIN SODIUM 40 MG/0.4ML ~~LOC~~ SOLN
40.0000 mg | SUBCUTANEOUS | Status: DC
  Administered 2017-02-10 – 2017-02-12 (×3): 40 mg via SUBCUTANEOUS
  Filled 2017-02-09 (×3): qty 0.4

## 2017-02-09 MED ORDER — ONDANSETRON HCL 4 MG PO TABS: 4.0000 mg | ORAL_TABLET | Freq: Four times a day (QID) | ORAL | Status: DC | PRN

## 2017-02-09 MED ORDER — LIDOCAINE 2% (20 MG/ML) 5 ML SYRINGE
INTRAMUSCULAR | Status: DC | PRN
  Administered 2017-02-09: 1.5 mg/kg/h via INTRAVENOUS

## 2017-02-09 MED ORDER — B COMPLEX-C PO TABS
1.0000 | ORAL_TABLET | ORAL | Status: DC
  Administered 2017-02-11: 1 via ORAL
  Filled 2017-02-09 (×2): qty 1

## 2017-02-09 MED ORDER — SODIUM CHLORIDE 0.9 % IJ SOLN
INTRAMUSCULAR | Status: AC
  Filled 2017-02-09: qty 50

## 2017-02-09 MED ORDER — HYDROMORPHONE HCL 1 MG/ML IJ SOLN
INTRAMUSCULAR | Status: AC
  Filled 2017-02-09: qty 0.5

## 2017-02-09 MED ORDER — SCOPOLAMINE 1 MG/3DAYS TD PT72
MEDICATED_PATCH | TRANSDERMAL | Status: DC | PRN
  Administered 2017-02-09: 1 via TRANSDERMAL

## 2017-02-09 MED ORDER — BUPIVACAINE LIPOSOME 1.3 % IJ SUSP
INTRAMUSCULAR | Status: DC | PRN
  Administered 2017-02-09: 20 mL

## 2017-02-09 MED ORDER — ONDANSETRON HCL 4 MG/2ML IJ SOLN
INTRAMUSCULAR | Status: DC | PRN
  Administered 2017-02-09 (×2): 4 mg via INTRAVENOUS

## 2017-02-09 MED ORDER — ONDANSETRON HCL 4 MG/2ML IJ SOLN
INTRAMUSCULAR | Status: AC
  Filled 2017-02-09: qty 2

## 2017-02-09 MED ORDER — MAGIC MOUTHWASH
15.0000 mL | Freq: Four times a day (QID) | ORAL | Status: DC | PRN
  Filled 2017-02-09: qty 15

## 2017-02-09 MED ORDER — LIDOCAINE 2% (20 MG/ML) 5 ML SYRINGE
INTRAMUSCULAR | Status: AC
  Filled 2017-02-09: qty 15

## 2017-02-09 MED ORDER — METOPROLOL SUCCINATE ER 25 MG PO TB24
25.0000 mg | ORAL_TABLET | Freq: Every day | ORAL | Status: DC
  Administered 2017-02-09 – 2017-02-11 (×2): 25 mg via ORAL
  Filled 2017-02-09 (×2): qty 1

## 2017-02-09 MED ORDER — GABAPENTIN 300 MG PO CAPS
ORAL_CAPSULE | ORAL | Status: AC
  Filled 2017-02-09: qty 1

## 2017-02-09 MED ORDER — CLINDAMYCIN PHOSPHATE 900 MG/50ML IV SOLN
900.0000 mg | Freq: Three times a day (TID) | INTRAVENOUS | Status: AC
  Administered 2017-02-09: 900 mg via INTRAVENOUS
  Filled 2017-02-09: qty 50

## 2017-02-09 MED ORDER — ONDANSETRON HCL 4 MG/2ML IJ SOLN: 4.0000 mg | Freq: Four times a day (QID) | INTRAMUSCULAR | Status: DC | PRN

## 2017-02-09 MED ORDER — PHENYLEPHRINE HCL 10 MG/ML IJ SOLN
INTRAMUSCULAR | Status: AC
  Filled 2017-02-09: qty 1

## 2017-02-09 MED ORDER — GABAPENTIN 300 MG PO CAPS
300.0000 mg | ORAL_CAPSULE | ORAL | Status: AC
  Administered 2017-02-09: 300 mg via ORAL

## 2017-02-09 MED ORDER — ENOXAPARIN SODIUM 40 MG/0.4ML ~~LOC~~ SOLN
40.0000 mg | Freq: Once | SUBCUTANEOUS | Status: AC
  Administered 2017-02-09: 40 mg via SUBCUTANEOUS
  Filled 2017-02-09: qty 0.4

## 2017-02-09 MED ORDER — ROCURONIUM BROMIDE 50 MG/5ML IV SOSY
PREFILLED_SYRINGE | INTRAVENOUS | Status: AC
  Filled 2017-02-09: qty 5

## 2017-02-09 MED ORDER — PHENOL 1.4 % MT LIQD: 1.0000 | OROMUCOSAL | Status: DC | PRN

## 2017-02-09 MED ORDER — HYDROMORPHONE HCL 1 MG/ML IJ SOLN: 0.5000 mg | INTRAMUSCULAR | Status: DC | PRN

## 2017-02-09 MED ORDER — TRAMADOL HCL 50 MG PO TABS: 50.0000 mg | ORAL_TABLET | Freq: Four times a day (QID) | ORAL | 0 refills | Status: DC | PRN

## 2017-02-09 MED ORDER — SCOPOLAMINE 1 MG/3DAYS TD PT72
MEDICATED_PATCH | TRANSDERMAL | Status: AC
  Filled 2017-02-09: qty 1

## 2017-02-09 MED ORDER — DIPHENHYDRAMINE HCL 50 MG/ML IJ SOLN: 12.5000 mg | Freq: Four times a day (QID) | INTRAMUSCULAR | Status: DC | PRN

## 2017-02-09 MED ORDER — PHENYLEPHRINE HCL 10 MG/ML IJ SOLN
INTRAMUSCULAR | Status: DC | PRN
  Administered 2017-02-09: 120 ug via INTRAVENOUS
  Administered 2017-02-09: 40 ug via INTRAVENOUS
  Administered 2017-02-09: 80 ug via INTRAVENOUS

## 2017-02-09 SURGICAL SUPPLY — 102 items
APPLIER CLIP 5 13 M/L LIGAMAX5 (MISCELLANEOUS)
APPLIER CLIP ROT 10 11.4 M/L (STAPLE)
BLADE EXTENDED COATED 6.5IN (ELECTRODE) ×3 IMPLANT
CANNULA REDUC XI 12-8 STAPL (CANNULA) ×1
CANNULA REDUC XI 12-8MM STAPL (CANNULA) ×1
CANNULA REDUCER 12-8 DVNC XI (CANNULA) ×1 IMPLANT
CELLS DAT CNTRL 66122 CELL SVR (MISCELLANEOUS) IMPLANT
CHLORAPREP W/TINT 26ML (MISCELLANEOUS) ×3 IMPLANT
CLIP APPLIE 5 13 M/L LIGAMAX5 (MISCELLANEOUS) IMPLANT
CLIP APPLIE ROT 10 11.4 M/L (STAPLE) IMPLANT
CLIP LIGATING HEM O LOK PURPLE (MISCELLANEOUS) IMPLANT
CLIP LIGATING HEMO O LOK GREEN (MISCELLANEOUS) IMPLANT
COUNTER NEEDLE 20 DBL MAG RED (NEEDLE) ×3 IMPLANT
COVER MAYO STAND STRL (DRAPES) ×6 IMPLANT
COVER SURGICAL LIGHT HANDLE (MISCELLANEOUS) ×3 IMPLANT
COVER TIP SHEARS 8 DVNC (MISCELLANEOUS) ×1 IMPLANT
COVER TIP SHEARS 8MM DA VINCI (MISCELLANEOUS) ×2
DECANTER SPIKE VIAL GLASS SM (MISCELLANEOUS) ×3 IMPLANT
DEVICE TROCAR PUNCTURE CLOSURE (ENDOMECHANICALS) ×3 IMPLANT
DRAIN CHANNEL 19F RND (DRAIN) IMPLANT
DRAPE ARM DVNC X/XI (DISPOSABLE) ×4 IMPLANT
DRAPE COLUMN DVNC XI (DISPOSABLE) ×1 IMPLANT
DRAPE DA VINCI XI ARM (DISPOSABLE) ×8
DRAPE DA VINCI XI COLUMN (DISPOSABLE) ×2
DRAPE SURG IRRIG POUCH 19X23 (DRAPES) ×3 IMPLANT
DRSG OPSITE POSTOP 4X10 (GAUZE/BANDAGES/DRESSINGS) IMPLANT
DRSG OPSITE POSTOP 4X6 (GAUZE/BANDAGES/DRESSINGS) IMPLANT
DRSG OPSITE POSTOP 4X8 (GAUZE/BANDAGES/DRESSINGS) IMPLANT
DRSG TEGADERM 2-3/8X2-3/4 SM (GAUZE/BANDAGES/DRESSINGS) ×12 IMPLANT
DRSG TEGADERM 4X4.75 (GAUZE/BANDAGES/DRESSINGS) IMPLANT
ELECT PENCIL ROCKER SW 15FT (MISCELLANEOUS) ×6 IMPLANT
ELECT REM PT RETURN 15FT ADLT (MISCELLANEOUS) ×3 IMPLANT
ENDOLOOP SUT PDS II  0 18 (SUTURE)
ENDOLOOP SUT PDS II 0 18 (SUTURE) IMPLANT
EVACUATOR SILICONE 100CC (DRAIN) IMPLANT
GAUZE SPONGE 2X2 8PLY STRL LF (GAUZE/BANDAGES/DRESSINGS) ×1 IMPLANT
GAUZE SPONGE 4X4 12PLY STRL (GAUZE/BANDAGES/DRESSINGS) IMPLANT
GLOVE ECLIPSE 8.0 STRL XLNG CF (GLOVE) ×9 IMPLANT
GLOVE INDICATOR 8.0 STRL GRN (GLOVE) ×9 IMPLANT
GOWN STRL REUS W/TWL XL LVL3 (GOWN DISPOSABLE) ×15 IMPLANT
GRASPER ENDOPATH ANVIL 10MM (MISCELLANEOUS) IMPLANT
HOLDER FOLEY CATH W/STRAP (MISCELLANEOUS) ×3 IMPLANT
IRRIG SUCT STRYKERFLOW 2 WTIP (MISCELLANEOUS) ×3
IRRIGATION SUCT STRKRFLW 2 WTP (MISCELLANEOUS) ×1 IMPLANT
KIT PROCEDURE DA VINCI SI (MISCELLANEOUS) ×2
KIT PROCEDURE DVNC SI (MISCELLANEOUS) ×1 IMPLANT
LEGGING LITHOTOMY PAIR STRL (DRAPES) ×3 IMPLANT
LUBRICANT JELLY K Y 4OZ (MISCELLANEOUS) IMPLANT
NEEDLE INSUFFLATION 14GA 120MM (NEEDLE) ×3 IMPLANT
PACK CARDIOVASCULAR III (CUSTOM PROCEDURE TRAY) ×3 IMPLANT
PACK COLON (CUSTOM PROCEDURE TRAY) ×3 IMPLANT
PAD POSITIONING PINK XL (MISCELLANEOUS) ×3 IMPLANT
PORT LAP GEL ALEXIS MED 5-9CM (MISCELLANEOUS) ×3 IMPLANT
RTRCTR WOUND ALEXIS 18CM MED (MISCELLANEOUS)
SCISSORS LAP 5X35 DISP (ENDOMECHANICALS) ×3 IMPLANT
SEAL CANN UNIV 5-8 DVNC XI (MISCELLANEOUS) ×3 IMPLANT
SEAL XI 5MM-8MM UNIVERSAL (MISCELLANEOUS) ×6
SEALER VESSEL DA VINCI XI (MISCELLANEOUS) ×2
SEALER VESSEL EXT DVNC XI (MISCELLANEOUS) ×1 IMPLANT
SLEEVE ADV FIXATION 5X100MM (TROCAR) ×3 IMPLANT
SOLUTION ELECTROLUBE (MISCELLANEOUS) ×3 IMPLANT
SPONGE GAUZE 2X2 STER 10/PKG (GAUZE/BANDAGES/DRESSINGS) ×2
STAPLER 45 BLU RELOAD XI (STAPLE) IMPLANT
STAPLER 45 BLUE RELOAD XI (STAPLE)
STAPLER 45 GREEN RELOAD XI (STAPLE) ×2
STAPLER 45 GRN RELOAD XI (STAPLE) ×1 IMPLANT
STAPLER CANNULA SEAL DVNC XI (STAPLE) ×1 IMPLANT
STAPLER CANNULA SEAL XI (STAPLE) ×2
STAPLER CIRC ILS CVD 33MM 37CM (STAPLE) ×3 IMPLANT
STAPLER SHEATH (SHEATH) ×2
STAPLER SHEATH ENDOWRIST DVNC (SHEATH) ×1 IMPLANT
SUT MNCRL AB 4-0 PS2 18 (SUTURE) ×3 IMPLANT
SUT PDS AB 1 CTX 36 (SUTURE) ×6 IMPLANT
SUT PDS AB 1 TP1 96 (SUTURE) IMPLANT
SUT PDS AB 2-0 CT2 27 (SUTURE) IMPLANT
SUT PROLENE 0 CT 2 (SUTURE) ×3 IMPLANT
SUT PROLENE 2 0 KS (SUTURE) ×3 IMPLANT
SUT PROLENE 2 0 SH DA (SUTURE) IMPLANT
SUT SILK 2 0 (SUTURE) ×2
SUT SILK 2 0 SH CR/8 (SUTURE) ×3 IMPLANT
SUT SILK 2-0 18XBRD TIE 12 (SUTURE) ×1 IMPLANT
SUT SILK 3 0 (SUTURE) ×2
SUT SILK 3 0 SH CR/8 (SUTURE) ×3 IMPLANT
SUT SILK 3-0 18XBRD TIE 12 (SUTURE) ×1 IMPLANT
SUT V-LOC BARB 180 2/0GR6 GS22 (SUTURE)
SUT VIC AB 3-0 SH 18 (SUTURE) IMPLANT
SUT VIC AB 3-0 SH 27 (SUTURE)
SUT VIC AB 3-0 SH 27XBRD (SUTURE) IMPLANT
SUT VICRYL 0 UR6 27IN ABS (SUTURE) ×6 IMPLANT
SUTURE V-LC BRB 180 2/0GR6GS22 (SUTURE) IMPLANT
SYR 10ML LL (SYRINGE) ×3 IMPLANT
SYS LAPSCP GELPORT 120MM (MISCELLANEOUS)
SYSTEM LAPSCP GELPORT 120MM (MISCELLANEOUS) IMPLANT
TAPE UMBILICAL COTTON 1/8X30 (MISCELLANEOUS) ×3 IMPLANT
TOWEL OR 17X26 10 PK STRL BLUE (TOWEL DISPOSABLE) IMPLANT
TOWEL OR NON WOVEN STRL DISP B (DISPOSABLE) ×3 IMPLANT
TRAY FOLEY W/METER SILVER 14FR (SET/KITS/TRAYS/PACK) ×3 IMPLANT
TRAY FOLEY W/METER SILVER 16FR (SET/KITS/TRAYS/PACK) IMPLANT
TROCAR ADV FIXATION 5X100MM (TROCAR) IMPLANT
TUBING CONNECTING 10 (TUBING) IMPLANT
TUBING CONNECTING 10' (TUBING)
TUBING INSUFFLATION 10FT LAP (TUBING) IMPLANT

## 2017-02-09 NOTE — Anesthesia Preprocedure Evaluation (Signed)
Anesthesia Evaluation  Patient identified by MRN, date of birth, ID band Patient awake    Reviewed: Allergy & Precautions, NPO status , Patient's Chart, lab work & pertinent test results  History of Anesthesia Complications (+) PONV  Airway Mallampati: II  TM Distance: >3 FB Neck ROM: Full    Dental no notable dental hx.    Pulmonary neg pulmonary ROS, former smoker,    Pulmonary exam normal breath sounds clear to auscultation       Cardiovascular hypertension, Normal cardiovascular exam Rhythm:Regular Rate:Normal     Neuro/Psych negative neurological ROS  negative psych ROS   GI/Hepatic negative GI ROS, Neg liver ROS,   Endo/Other  negative endocrine ROS  Renal/GU negative Renal ROS  negative genitourinary   Musculoskeletal negative musculoskeletal ROS (+)   Abdominal   Peds negative pediatric ROS (+)  Hematology negative hematology ROS (+)   Anesthesia Other Findings   Reproductive/Obstetrics negative OB ROS                             Anesthesia Physical Anesthesia Plan  ASA: II  Anesthesia Plan: General   Post-op Pain Management:    Induction: Intravenous  PONV Risk Score and Plan: 2 and Ondansetron, Dexamethasone and Treatment may vary due to age or medical condition  Airway Management Planned: Oral ETT  Additional Equipment:   Intra-op Plan:   Post-operative Plan: Extubation in OR  Informed Consent: I have reviewed the patients History and Physical, chart, labs and discussed the procedure including the risks, benefits and alternatives for the proposed anesthesia with the patient or authorized representative who has indicated his/her understanding and acceptance.   Dental advisory given  Plan Discussed with: CRNA and Surgeon  Anesthesia Plan Comments:         Anesthesia Quick Evaluation

## 2017-02-09 NOTE — Op Note (Signed)
02/09/2017  11:56 AM  PATIENT:  Renee [name]  71 y.o. female  Patient Care Team: [name], MD as PCP - General (Family Medicine) Melissa Montane, MD as Consulting Physician (Otolaryngology) Gatha Mayer, MD as Consulting Physician (Gastroenterology) Dr Shanon Brow Daughtry (Optometry) Michael Boston, MD as Consulting Physician (General Surgery) Love, Alyson Locket, MD as Consulting Physician (Neurology) Bernell List, CPhT as [facility] (Pharmacy Technician)  PRE-OPERATIVE DIAGNOSIS:  Sigmoid polyp, Rectosgmoid stricture  POST-OPERATIVE DIAGNOSIS:  Sigmoid polyp, Rectosigmoid stricture  PROCEDURE:  XI ROBOTIC RESECTION OF SIGMOID COLON, RIGID PROCTOSCOPY  SURGEON:  [name], MD  ASSISTANT: Leighton Ruff, MD, FACS.  ANESTHESIA:   local and general  EBL:  Total I/O In: 1600 [I.V.:1600] Out: 200 [Urine:50; Blood:150]  Delay start of Pharmacological VTE agent (>24hrs) due to surgical blood loss or risk of bleeding:  no  DRAINS: none   SPECIMEN:  Source of Specimen:  RECTOSIGMOID COLON (open end proximal) with distal anastomotic ring  DISPOSITION OF SPECIMEN:  PATHOLOGY  COUNTS:  YES  PLAN OF CARE: Admit to inpatient   PATIENT DISPOSITION:  PACU - hemodynamically stable.  INDICATION:    Patient found to have large unresectable polyp in sigmoid colon with nearby smooth.  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  I recommended segmental resection:  The anatomy & physiology of the digestive tract was discussed.  The pathophysiology was discussed.  Natural history risks without surgery was discussed.   I worked to give an overview of the disease and the frequent need to have multispecialty involvement.  I feel the risks of no intervention will lead to serious problems that outweigh the operative risks; therefore, I recommended a partial colectomy to remove the pathology.  Laparoscopic & open techniques were discussed.   Risks such as  bleeding, infection, abscess, leak, reoperation, possible ostomy, hernia, heart attack, death, and other risks were discussed.  I noted a good likelihood this will help address the problem.   Goals of post-operative recovery were discussed as well.  We will work to minimize complications.  Educational materials on the pathology had been given in the office.  Questions were answered.    The patient expressed understanding & wished to proceed with surgery.  OR FINDINGS:   Patient had thickened tight region at the rectosigmoid junction suspicious for stricture.  Some adhesions to the pelvis.  Colon dilated proximally.  Patient had large but soft polyp just proximal to the smooth but thick and inflamed stricture  No obvious metastatic disease on visceral parietal peritoneum or liver.  The anastomosis rests 14 cm from the anal verge by rigid proctoscopy.  DESCRIPTION:   Informed consent was confirmed.  The patient underwent general anaesthesia without difficulty.  The patient was positioned appropriately.  VTE prevention in place.  The patient's abdomen was clipped, prepped, & draped in a sterile fashion.  Surgical timeout confirmed our plan.  The patient was positioned in reverse Trendelenburg.  Abdominal entry was gained using Varess technique with a trach hook on the anterior abdominal wall fascia in the left upper abdomen.  Entry was clean.  I induced carbon dioxide insufflation.  Camera inspection revealed no injury.  Extra ports were carefully placed under direct laparoscopic visualization.  I reflected the greater omentum and the upper abdomen the small bowel in the upper abdomen.  The patient was carefully positioned.  The Intuitive daVinci robot was carefully docked with camera & instruments carefully placed.  The patient had thickened rectum and  rectosigmoid colon and see tattooing in the distal sigmoid colon that correlated with the being proximal to the polyp intraluminally based on Dr.  Celesta Aver colonoscopy report  and up mobilizing the rectosigmoid colon off the anterior pelvis and bladder.  Off the uterus and left adnexa.  This help straighten it out and get the bulky thickened rectosigmoid colon with obvious stricture out of the pelvis.  I could elevate it in place the rectosigmoid mesentery on axial tension.  I scored the base of peritoneum of the medial side of the mesentery of the left colon from the ligament of Treitz to the peritoneal reflection of the mid rectum.   I elevated the sigmoid mesentery and entered into the retro-mesenteric plane. We were able to identify the left ureter and gonadal vessels. We kept those posterior within the retroperitoneum and elevated the left colon mesentery off that. I did isolate the inferior mesenteric artery (IMA) pedicle but did not ligate it yet.  I continued distally and got into the avascular plane posterior to the mesorectum. This allowed me to help mobilize the rectum as well by freeing the mesorectum off the sacrum.  I mobilized the peritoneal coverings towards the peritoneal reflection on both the right and left sides of the rectum.  I stayed away from the right and left ureters.  I kept the lateral vascular pedicles to the rectum intact.  I skeletonized the lymph nodes off the inferior mesenteric artery pedicle.  I went down to its takeoff from the aorta.  I isolated the inferior mesenteric vein off of the ligament of Treitz just cephalad to that as well.  After confirming the left ureter was out of the way, I went ahead and ligated the inferior mesenteric artery pedicle just near its takeoff from the aorta.  I did ligate the inferior mesenteric vein in a similar fashion.  We ensured hemostasis. I skeletonized the mesorectum at the junction at the proximal rectum for the distal point of resection.  I mobilized the left colon in a lateral to medial fashion off the line of Toldt up towards the splenic flexure to ensure good mobilization of the  remaining left colon to reach into the pelvis.  I skeletonized at the proximal mesorectum and transected at the proximal rectum using a robotic 45 mm stapler.  I chose a region at the descending/sigmoid junction that was soft and easily reached down to the rectal stump.  I transected the mesentery of the colon radially to preserve remaining colon blood supply.  I created an extraction incision through a small Pfannenstiel incision in the suprapubic region.  Placed a wound protector.  I was able to eviscerate the rectosigmoid and descending colon out the wound.   I clamped the colon proximal to this area using a reusable pursestringer device.  Passed a 2-0 Keith needle. I transected at the descending/sigmoid junction with a scalpel. I got healthy bleeding mucosa.  We sent the rectosigmoid colon specimen off to go to pathology.  We sized the colon orifice.  I chose a 33 EEA anvil stapler system.  I reinforced the prolene pursestring with interrupted silk suture.  There is a small diverticulum which I incorporated with a separate pursestring to keep it out of the staple line.  I placed the anvil to the open end of the proximal remaining colon and closed around it using the pursestring.    We did copious irrigation with crystalloid solution.  Hemostasis was good.  The distal end of the remaining colon  easily reached down to the rectal stump, therefore, splenic flexure mobilization was not needed.      Dr Marcello Moores scrubbed down and did gentle anal dilation and advanced the EEA stapler up the rectal stump. The spike was brought out at the provimal end of the rectal stump under direct visualization.  I attached the anvil of the proximal colon the spike of the stapler. Anvil was tightened down and held clamped for 60 seconds. The EEA stapler was fired and held clamped for 30 seconds. The stapler was released & removed. We noted 2 excellent anastomotic rings. Blue stitch is in the proximal ring.  Dr Marcello Moores did rigid  proctoscopy noted the anastomosis was at 14 cm from the anal verge consistent with the proximal rectum.  We did a final irrigation of antibiotic solution (900 mg clindamycin/240 mg gentamicin in a liter of crystalloid) & held that for the pelvic air leak test .  The rectum was insufflated the rectum while clamping the colon proximal to that anastomosis.  There was a negative air leak test. There was no tension of mesentery or bowel at the anastomosis.   Tissues looked viable.  Ureters & bowel uninjured.  The anastomosis looked healthy.  I closed the 12 mm stapler port site with a 0 Vicryl using a laparoscopic suture passer under direct visualization.  Endoluminal gas was evacuated.  Ports & wound protector removed.  I injected the specimen and confirmed the soft polyp just proximal to an obvious stricture.  Stricture felt smooth intraluminally, arguing against an tumor.  We changed gloves & redraped the patient per colon SSI prevention protocol.  We aspirated the antibiotic irrigation.  Hemostasis was good.  Sterile unused instruments were used from this point.  I closed the skin at the port sites using Monocryl stitch and sterile dressing.  I closed the extraction wound using a 0 Vicryl vertical peritoneal closure and a #1 PDS transverse anterior rectal fascial closure like a small Pfannenstiel closure. I closed the skin with some interrupted Monocryl stitches. I placed antibiotic-soaked wicks into the closure at the corners x2.  I placed sterile dressings.     Patient is being extubated go to recovery room. I had discussed postop care with the patient in detail the office & in the holding area. Instructions are written.  I'm about to locate family and discuss it with them as well.  [name], M.D., F.A.C.S. Gastrointestinal and Minimally Invasive Surgery [facility] [address], [place], [place] [address] [phone] Main / Paging

## 2017-02-09 NOTE — Anesthesia Procedure Notes (Signed)
Procedure Name: Intubation Date/Time: 02/09/2017 8:43 AM Performed by: Claudia Desanctis Pre-anesthesia Checklist: Patient identified, Emergency Drugs available, Suction available and Patient being monitored Patient Re-evaluated:Patient Re-evaluated prior to inductionOxygen Delivery Method: Circle system utilized Preoxygenation: Pre-oxygenation with 100% oxygen Intubation Type: IV induction Ventilation: Mask ventilation without difficulty and Oral airway inserted - appropriate to patient size Laryngoscope Size: Sabra Heck and 2 Grade View: Grade I Tube type: Oral Tube size: 7.0 mm Number of attempts: 1 Airway Equipment and Method: Stylet and Oral airway Placement Confirmation: ETT inserted through vocal cords under direct vision,  positive ETCO2 and breath sounds checked- equal and bilateral Secured at: 21 cm Tube secured with: Tape Dental Injury: Teeth and Oropharynx as per pre-operative assessment  Comments: Intubation performed by Julianne Rice, SRNA

## 2017-02-09 NOTE — Anesthesia Postprocedure Evaluation (Signed)
Anesthesia Post Note  Patient: ZOIEE WIMMER  Procedure(s) Performed: Procedure(s) (LRB): XI ROBOTIC RESECTION OF SIGMOID COLON, RIGID PROCTOSCOPY (N/A)     Patient location during evaluation: PACU Anesthesia Type: General Level of consciousness: awake and alert Pain management: pain level controlled Vital Signs Assessment: post-procedure vital signs reviewed and stable Respiratory status: spontaneous breathing, nonlabored ventilation, respiratory function stable and patient connected to nasal cannula oxygen Cardiovascular status: blood pressure returned to baseline and stable Postop Assessment: no signs of nausea or vomiting Anesthetic complications: no    Last Vitals:  Vitals:   02/09/17 1245 02/09/17 1300  BP: 119/77 124/74  Pulse: 66 (!) 59  Resp: 15 14  Temp: 36.3 C 36.5 C    Last Pain:  Vitals:   02/09/17 1245  TempSrc:   PainSc: 1                  Finnley Lewis S

## 2017-02-09 NOTE — H&P (Signed)
Renee [name] 12/27/2016 9:13 AM Location: [facility] Patient #: [id number] DOB: 03-Nov-1945 Married / Language: English / Race: White Female  Patient Care Team: [name], MD as PCP - General (Family Medicine) Melissa Montane, MD as Consulting Physician (Otolaryngology) Gatha Mayer, MD as Consulting Physician (Gastroenterology) Dr Shanon Brow Daughtry (Optometry) Michael Boston, MD as Consulting Physician (General Surgery) Love, Alyson Locket, MD as Consulting Physician (Neurology) Bernell List, CPhT as [facility] [employer] (Pharmacy Technician)   History of Present Illness Renee Hector MD; 12/27/2016 9:48 AM) The patient is a 71 year old female who presents with a colonic polyp. Note for "Colonic polyp": ` ` ` Patient sent for surgical consultation at the request of Dr. Silvano [name]  Chief Complaint: Sigmoid polyp with narrowing and diverticulosis. Consider resection.  The patient is a 71 year old woman who was sent for GI consultation given Hemoccult positive study from primary care physician.   The patient was sent to see Dr. [name] with [facility] gastroenterology. She underwent endoscopy and found to have a mass in the sigmoid region suspicious for atypical polyp. Some narrowing and diverticuli in the region as well. Attempts made to biopsy. Tissue quality poor. High suspicion for large unresectable polyp. Sent for surgical consultation. Patient comes in today by herself. She's getting over a cold. Husband is worse cold so she Them in the lobby. Any abdominal surgeries. Usually moves her bowels twice a day. Occasionally gets some intermittent lower abdominal pain and bloating. Recalls being told she had an attack of diverticulitis over a decade ago. Cannot remember if she was given antibiotics. Only attack that she can recall. She had a perirectal abscess drained about 40 years ago. No problems since. She's had intermittent bloating  for several years. No bad episodes of nausea. She occasionally has had some mild heartburn controlled with H2 blockers. Nothing too severe.  No personal nor family history of GI/colon cancer, inflammatory bowel disease, irritable bowel syndrome, allergy such as Celiac Sprue, dietary/dairy problems, colitis, ulcers nor gastritis. No recent sick contacts/gastroenteritis. No travel outside the country. No changes in diet. No dysphagia to solids or liquids. No significant heartburn or reflux. No hematochezia, hematemesis, coffee ground emesis. No evidence of prior gastric/peptic ulceration.   No new events.  Ready for surgery.  (Review of systems as stated in this history (HPI) or in the review of systems. 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 Strong famiy history. Negative Neurology w/u Dr Erling Cruz  Allergies [name], [place]; 12/27/2016 9:15 AM) Albuterol *ANTIASTHMATIC AND BRONCHODILATOR AGENTS*  Penicillin G Potassium *PENICILLINS*  Allergies Reconciled   Medication History [name], RMA; 12/27/2016 9:15 AM) Atorvastatin Calcium (10MG  Tablet, Oral) Active. Azithromycin (250MG  Tablet, Oral) Active. RaNITidine HCl (300MG  Capsule, Oral) Active. Levocetirizine Dihydrochloride (5MG  Tablet, Oral) Active. Fluticasone Propionate (50MCG/ACT Suspension, Nasal) Active. Medications Reconciled  Vitals [employer] RMA; 12/27/2016 9:16 AM) 12/27/2016 9:15 AM Weight: 188.4 lb Height: 66.5in Body Surface Area: 1.96 m Body Mass Index: 29.95 kg/m  Temp.: 98.4F  Pulse: 99 (Regular)  P.OX: 99% (Room air) BP: 1674/102 (Sitting, Left Arm, Standard)   BP 131/73   Pulse 79   Temp 98.2 F (36.8 C) (Oral)   Resp 18   SpO2 98%     Physical Exam Renee Hector MD; 12/27/2016 9:30 AM) General Mental Status-Alert. General Appearance-Not in acute  distress, Not Sickly.  Orientation-Oriented X3. Hydration-Well hydrated. Voice-Normal.  Integumentary Global Assessment Upon inspection and palpation of skin surfaces of the - Axillae: non-tender, no inflammation or ulceration, no drainage. and Distribution of scalp and body hair is normal. General Characteristics Temperature - normal warmth is noted.  Head and Neck Head-normocephalic, atraumatic with no lesions or palpable masses. Face Global Assessment - atraumatic, no absence of expression. Neck Global Assessment - no abnormal movements, no bruit auscultated on the right, no bruit auscultated on the left, no decreased range of motion, non-tender. Trachea-midline. Thyroid Gland Characteristics - non-tender.  Eye Eyeball - Left-Extraocular movements intact, No Nystagmus. Eyeball - Right-Extraocular movements intact, No Nystagmus. Cornea - Left-No Hazy. Cornea - Right-No Hazy. Sclera/Conjunctiva - Left-No scleral icterus, No Discharge. Sclera/Conjunctiva - Right-No scleral icterus, No Discharge. Pupil - Left-Direct reaction to light normal. Pupil - Right-Direct reaction to light normal.  ENMT Ears Pinna - Left - no drainage observed, no generalized tenderness observed. Right - no drainage observed, no generalized tenderness observed. Nose and Sinuses External Inspection of the Nose - no destructive lesion observed. Inspection of the nares - Left - quiet respiration. Right - quiet respiration. Mouth and Throat Lips - Upper Lip - no fissures observed, no pallor noted. Lower Lip - no fissures observed, no pallor noted. Nasopharynx - no discharge present. Oral Cavity/Oropharynx - Tongue - no dryness observed. Oral Mucosa - no cyanosis observed. Hypopharynx - no evidence of airway distress observed. Note: Poor hearing out of right ear.  Significant neck tremor with spasmic jerking movements of head & neck   Chest and Lung Exam Inspection Movements -  Normal and Symmetrical. Accessory muscles - No use of accessory muscles in breathing. Palpation Palpation of the chest reveals - Non-tender. Auscultation Breath sounds - Normal and Clear.  Cardiovascular Auscultation Rhythm - Regular. Murmurs & Other Heart Sounds - Auscultation of the heart reveals - No Murmurs and No Systolic Clicks.  Abdomen Inspection Inspection of the abdomen reveals - No Visible peristalsis and No Abnormal pulsations. Umbilicus - No Bleeding, No Urine drainage. Palpation/Percussion Palpation and Percussion of the abdomen reveal - Soft, Non Tender, No Rebound tenderness, No Rigidity (guarding) and No Cutaneous hyperesthesia. Note: Abdomen soft. Not distended. No distasis recti. No umbilical or other anterior abdominal wall hernias   Female Genitourinary Sexual Maturity Tanner 5 - Adult hair pattern. Note: No vaginal bleeding nor discharge   Rectal Note: Deferred given recent colonoscopy   Peripheral Vascular Upper Extremity Inspection - Left - No Cyanotic nailbeds, Not Ischemic. Right - No Cyanotic nailbeds, Not Ischemic.  Neurologic Neurologic evaluation reveals -normal attention span and ability to concentrate, able to name objects and repeat phrases. Appropriate fund of knowledge , normal sensation and normal coordination. Mental Status Affect - not angry, not paranoid. Cranial Nerves-Normal Bilaterally. Gait-Normal.  Neuropsychiatric Mental status exam performed with findings of-able to articulate well with normal speech/language, rate, volume and coherence, thought content normal with ability to perform basic computations and apply abstract reasoning and no evidence of hallucinations, delusions, obsessions or homicidal/suicidal ideation.  Musculoskeletal Global Assessment Spine, Ribs and Pelvis - no instability, subluxation or laxity. Right Upper Extremity - no instability, subluxation or laxity.  Lymphatic Head & Neck  General  Head & Neck Lymphatics: Bilateral - Description - No Localized lymphadenopathy. Axillary  General Axillary Region: Bilateral - Description - No Localized lymphadenopathy. Femoral & Inguinal  Generalized Femoral & Inguinal Lymphatics: Left - Description - No Localized lymphadenopathy. Right - Description - No Localized lymphadenopathy.  Assessment & Plan Renee Hector MD; 12/27/2016 9:46 AM) POLYP OF SIGMOID COLON, UNSPECIFIED TYPE (D12.5) Impression: Atypical. Polyp in sigmoid region. Polyps he is inconclusive but visual inspection suspicious by gastroenterologist. Given the fact it is not felt to be safely removed endoscopically, would recommend segmental resection to make sure that there is no cancer within it since it is a rather large size. Reasonable for a minimally invasive technique. Patient also has associated significant diverticulosis with stricture nearby as well. We'll be good idea to remove the region and regroup.  She is definitely interested in proceeding. Hoping maybe do it after her birthday next month.  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 Presumed to be benign but required pediatric colonoscope. Maybe explanation for intermittent abdominal bloating and lower abdominal pains. Would plan resection concurrent with polyp nearby  SIGMOID DIVERTICULOSIS (K57.30) Current Plans Pt Education - [facility] Diverticular Disease (AT) PREOP COLON - ENCOUNTER FOR PREOPERATIVE EXAMINATION FOR GENERAL SURGICAL PROCEDURE (Z01.818) Current Plans You are being scheduled for surgery- Our schedulers will call you.  You should hear from our office's scheduling department within 5 working days about the location, date, and time of surgery. We try to make accommodations for patient's preferences in scheduling surgery, but sometimes the OR schedule or the surgeon's schedule prevents [place] from making those accommodations.  If you have not heard from our office  [phone]) in 5 working days, call the office and ask for your surgeon's nurse.  If you have other questions about your diagnosis, plan, or surgery, call the office and ask for your surgeon's nurse.  Written instructions provided Pt Education - Pamphlet Given - Laparoscopic Colorectal Surgery: discussed with patient and provided information. Pt Education - [facility] Colon Bowel Prep 2015 Miralax/Antibiotics Started Neomycin Sulfate 500MG , 2 (two) Tablet SEE NOTE, #6, 12/27/2016, No Refill. Local Order: TAKE TWO TABLETS AT 2 PM, 3 PM, AND 10 PM THE DAY PRIOR TO SURGERY Started Flagyl 500MG , 2 (two) Tablet SEE NOTE, #6, 12/27/2016, No Refill. Local Order: Take at 2pm, 3pm, and 10pm the day prior to your colon operation The anatomy & physiology of the digestive tract was discussed. The pathophysiology of the colon was discussed. Natural history risks without surgery was discussed. I feel the risks of no intervention will lead to serious problems that outweigh the operative risks; therefore, I recommended a partial colectomy to remove the pathology. Minimally invasive (Robotic/Laparoscopic) & open techniques were discussed.  Risks such as bleeding, infection, abscess, leak, reoperation, possible ostomy, hernia, heart attack, death, and other risks were discussed. I noted a good likelihood this will help address the problem. Goals of post-operative recovery were discussed as well. Need for adequate nutrition, daily bowel regimen and healthy physical activity, to optimize recovery was noted as well. We will work to minimize complications. Educational materials were available as well. Questions were answered. The patient expresses understanding & wishes to proceed with surgery.  Pt Education - [facility] Colectomy post-op instructions: discussed with patient and provided information.   Renee [name], M.D., F.A.C.S. Gastrointestinal and Minimally Invasive Surgery [facility] [address], [place], [place] [address] [phone] Main / Paging  I have re-reviewed the the patient's records, history, medications, and allergies.  I have re-examined the patient.  I again discussed intraoperative plans and goals of post-operative recovery.  The patient agrees to proceed.  Renee [name]  Nov 17, 1945 [id number]  Patient Care Team: [name], MD as PCP - General (Family  Medicine) Melissa Montane, MD as Consulting Physician (Otolaryngology) Gatha Mayer, MD as Consulting Physician (Gastroenterology) Dr Shanon Brow Daughtry (Optometry) Michael Boston, MD as Consulting Physician (General Surgery) Love, Alyson Locket, MD as Consulting Physician (Neurology) Bernell List, CPhT as [facility] Management (Pharmacy Technician)  Patient Active Problem List   Diagnosis Date Noted  . Head tremor 12/27/2016  . Polyp of sigmoid colon 12/08/2016  . Hyperglycemia 10/07/2016  . Obesity 07/06/2016  . GERD (gastroesophageal reflux disease) 07/06/2016  . H/O osteopenia 02/08/2016  . Cervical pain (neck) 10/07/2015  . Preventative health care 05/12/2013  . Cervical cancer screening 05/08/2013  . ABNORMAL INVOLUNTARY MOVEMENTS 01/01/2009  . Hyperlipidemia, mixed 10/03/2008  . H/O tobacco use, presenting hazards to health 09/19/2008  . TREMOR, ESSENTIAL 09/19/2008  . 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 Essential hypertension 09/19/2008  . Allergic rhinitis 09/19/2008  . CHEST PAIN 09/19/2008    Past Medical History:  Diagnosis Date  . Allergic rhinitis   . 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 Concussion 1969  . GERD (gastroesophageal reflux disease)   . H/O tobacco use, presenting hazards to health 09/19/2008   Qualifier: Diagnosis of  By: Wynona Luna Smoking roughly 5 cig daily  Last cigarette was in December of 2016  . Hearing loss in right ear 09/19/2008   Qualifier:  Diagnosis of  By: Wynona Luna   . Hyperglycemia 10/07/2016  . Hyperlipidemia   . Hypertension   . 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 Obesity 07/06/2016  . Occult blood positive stool   . Polyp of sigmoid colon 12/08/2016   11/2016 - Unable to remove or get bx at colonoscopy. Fixed sigmoid colon prevents. Referred for surgery.  Marland Kitchen PONV (postoperative nausea and vomiting)    ,   . Preventative health care 05/12/2013  . Tobacco abuse   . Tremor, essential     Past Surgical History:  Procedure Laterality Date  . INNER EAR SURGERY  2015   with tube  . MOUTH SURGERY  01/22/11   lower denture secured by titanium bolts  . rectal suction biospy      Social History   Social History  . Marital status: Married    Spouse name: N/A  . Number of children: N/A  . Years of education: N/A   Occupational History  . Not on file.   Social History Main Topics  . Smoking status: Former Smoker    Packs/day: 0.50    Types: Cigarettes    Quit date: 09/07/2015  . Smokeless tobacco: Never Used  . Alcohol use 8.4 oz/week    14 Glasses of wine per week     Comment: 3 glasses of wine daily   . Drug use: No  . Sexual activity: Not Currently   Other Topics Concern  . Not on file   Social History Narrative  . No narrative on file    Family History  Problem Relation Age of Onset  . Heart attack Father 84       deceased  . Stroke Mother   . Hypertension Mother   . Alcohol abuse Mother   . Obesity Daughter   . Heart disease Paternal Grandmother        AAA rupture  . Heart disease Paternal Grandfather        MI  . Colon cancer Neg  Hx   . Breast cancer Neg Hx   . Prostate cancer Neg Hx   . Diabetes Neg Hx     Current Facility-Administered Medications  Medication Dose Route Frequency Provider Last Rate Last Dose  . acetaminophen (TYLENOL) 500 MG tablet           . bisacodyl (DULCOLAX) EC tablet 20 mg  20 mg Oral Once Michael Boston, MD      .  bupivacaine liposome (EXPAREL) 1.3 % injection 266 mg  20 mL Infiltration On Call to OR Michael Boston, MD      . clindamycin (CLEOCIN) 900 mg, gentamicin (GARAMYCIN) 240 mg in sodium chloride 0.9 % 1,000 mL for intraperitoneal lavage   Intraperitoneal To OR Michael Boston, MD      . gabapentin (NEURONTIN) 300 MG capsule           . gentamicin (GARAMYCIN) 340 mg in dextrose 5 % 100 mL IVPB  5 mg/kg (Adjusted) Intravenous 60 min Pre-Op Michael Boston, MD      . lactated ringers infusion   Intravenous Continuous Myrtie Soman, MD 10 mL/hr at 02/09/17 [phone]    . neomycin (MYCIFRADIN) tablet 1,000 mg  1,000 mg Oral 3 times per day Michael Boston, MD       And  . metroNIDAZOLE (FLAGYL) tablet 1,000 mg  1,000 mg Oral 3 times per day Michael Boston, MD      . ondansetron [facility]) 4 MG/2ML injection           . polyethylene glycol powder (GLYCOLAX/MIRALAX) container 255 g  1 Container Oral Once Michael Boston, MD      . scopolamine (TRANSDERM-SCOP) 1 MG/3DAYS              Allergies  Allergen Reactions  . Amoxicillin     REACTION: TROUBLE BREATHING Has patient had a PCN reaction causing immediate rash, facial/tongue/throat swelling, SOB or lightheadedness with hypotension:Yes Has patient had a PCN reaction causing severe rash involving mucus membranes or skin necrosis:No Has patient had a PCN reaction that required hospitalization:No Has patient had a PCN reaction occurring within the last 10 years:No If all of the above answers are "NO", then may proceed with Cephalosporin use.   Abe People Hfa [Albuterol] Other (See Comments)    Hypersensitivity, anxious, disoriented  . Penicillins Rash    Has patient had a PCN reaction causing immediate rash, facial/tongue/throat swelling, SOB or lightheadedness with hypotension:unknown Has patient had a PCN reaction causing severe rash involving mucus membranes or skin necrosis:unknown Has patient had a PCN reaction that required hospitalization:no Has patient had a  PCN reaction occurring within the last 10 years:no If all of the above answers are "NO", then may proceed with Cephalosporin use.     BP 131/73   Pulse 79   Temp 98.2 F (36.8 C) (Oral)   Resp 18   SpO2 98%   Labs: No results found for this or any previous visit (from the past 48 hour(s)).  Imaging / Studies: No results found.   Renee [name], M.D., F.A.C.S. Gastrointestinal and Minimally Invasive Surgery [facility] [address] [place], [place] [address] [phone] Main / Paging  02/09/2017 8:25 AM

## 2017-02-09 NOTE — Transfer of Care (Signed)
Immediate Anesthesia Transfer of Care Note  Patient: Renee [name]  Procedure(s) Performed: Procedure(s): XI ROBOTIC RESECTION OF SIGMOID COLON, RIGID PROCTOSCOPY (N/A)  Patient Location: PACU  Anesthesia Type:General  Level of Consciousness:  sedated, patient cooperative and responds to stimulation  Airway & Oxygen Therapy:Patient Spontanous Breathing and Patient connected to face mask oxgen  Post-op Assessment:  Report given to PACU RN and Post -op Vital signs reviewed and stable  Post vital signs:  Reviewed and stable  Last Vitals:  Vitals:   02/09/17 0622  BP: 131/73  Pulse: 79  Resp: 18  Temp: 03.4 C    Complications: No apparent anesthesia complications

## 2017-02-09 NOTE — Discharge Instructions (Signed)
SURGERY: POST OP INSTRUCTIONS °(Surgery for small bowel obstruction, colon resection, etc) ° ° °###################################################################### ° °EAT °Gradually transition to a high fiber diet with a fiber supplement over the next few days after discharge ° °WALK °Walk an hour a day.  Control your pain to do that.   ° °CONTROL PAIN °Control pain so that you can walk, sleep, tolerate sneezing/coughing, go up/down stairs. ° °HAVE A BOWEL MOVEMENT DAILY °Keep your bowels regular to avoid problems.  OK to try a laxative to override constipation.  OK to use an antidairrheal to slow down diarrhea.  Call if not better after 2 tries ° °CALL IF YOU HAVE PROBLEMS/CONCERNS °Call if you are still struggling despite following these instructions. °Call if you have concerns not answered by these instructions ° °###################################################################### ° ° °DIET °Follow a light diet the first few days at home.  Start with a bland diet such as soups, liquids, starchy foods, low fat foods, etc.  If you feel full, bloated, or constipated, stay on a ful liquid or pureed/blenderized diet for a few days until you feel better and no longer constipated. °Be sure to drink plenty of fluids every day to avoid getting dehydrated (feeling dizzy, not urinating, etc.). °Gradually add a fiber supplement to your diet over the next week.  Gradually get back to a regular solid diet.  Avoid fast food or heavy meals the first week as you are more likely to get nauseated. °It is expected for your digestive tract to need a few months to get back to normal.  It is common for your bowel movements and stools to be irregular.  You will have occasional bloating and cramping that should eventually fade away.  Until you are eating solid food normally, off all pain medications, and back to regular activities; your bowels will not be normal. °Focus on eating a low-fat, high fiber diet the rest of your life  (See Getting to Good Bowel Health, below). ° °CARE of your INCISION or WOUND °It is good for closed incision and even open wounds to be washed every day.  Shower every day.  Short baths are fine.  Wash the incisions and wounds clean with soap & water.    °If you have a closed incision(s), wash the incision with soap & water every day.  You may leave closed incisions open to air if it is dry.   You may cover the incision with clean gauze & replace it after your daily shower for comfort. °If you have skin tapes (Steristrips) or skin glue (Dermabond) on your incision, leave them in place.  They will fall off on their own like a scab.  You may trim any edges that curl up with clean scissors.  If you have staples, set up an appointment for them to be removed in the office in 10 days after surgery.  °If you have a drain, wash around the skin exit site with soap & water and place a new dressing of gauze or band aid around the skin every day.  Keep the drain site clean & dry.    °If you have an open wound with packing, see wound care instructions.  In general, it is encouraged that you remove your dressing and packing, shower with soap & water, and replace your dressing once a day.  Pack the wound with clean gauze moistened with normal (0.9%) saline to keep the wound moist & uninfected.  Pressure on the dressing for 30 minutes will stop most wound   bleeding.  Eventually your body will heal & pull the open wound closed over the next few months.  °Raw open wounds will occasionally bleed or secrete yellow drainage until it heals closed.  Drain sites will drain a little until the drain is removed.  Even closed incisions can have mild bleeding or drainage the first few days until the skin edges scab over & seal.   °If you have an open wound with a wound vac, see wound vac care instructions. ° ° ° ° °ACTIVITIES as tolerated °Start light daily activities --- self-care, walking, climbing stairs-- beginning the day after surgery.   Gradually increase activities as tolerated.  Control your pain to be active.  Stop when you are tired.  Ideally, walk several times a day, eventually an hour a day.   °Most people are back to most day-to-day activities in a few weeks.  It takes 4-8 weeks to get back to unrestricted, intense activity. °If you can walk 30 minutes without difficulty, it is safe to try more intense activity such as jogging, treadmill, bicycling, low-impact aerobics, swimming, etc. °Save the most intensive and strenuous activity for last (Usually 4-8 weeks after surgery) such as sit-ups, heavy lifting, contact sports, etc.  Refrain from any intense heavy lifting or straining until you are off narcotics for pain control.  You will have off days, but things should improve week-by-week. °DO NOT PUSH THROUGH PAIN.  Let pain be your guide: If it hurts to do something, don't do it.  Pain is your body warning you to avoid that activity for another week until the pain goes down. °You may drive when you are no longer taking narcotic prescription pain medication, you can comfortably wear a seatbelt, and you can safely make sudden turns/stops to protect yourself without hesitating due to pain. °You may have sexual intercourse when it is comfortable. If it hurts to do something, stop. ° °MEDICATIONS °Take your usually prescribed home medications unless otherwise directed.   °Blood thinners:  °Usually you can restart any strong blood thinners after the second postoperative day.  It is OK to take aspirin right away.    ° If you are on strong blood thinners (warfarin/Coumadin, Plavix, Xerelto, Eliquis, Pradaxa, etc), discuss with your surgeon, medicine PCP, and/or cardiologist for instructions on when to restart the blood thinner & if blood monitoring is needed (PT/INR blood check, etc).   ° ° °PAIN CONTROL °Pain after surgery or related to activity is often due to strain/injury to muscle, tendon, nerves and/or incisions.  This pain is usually  short-term and will improve in a few months.  °To help speed the process of healing and to get back to regular activity more quickly, DO THE FOLLOWING THINGS TOGETHER: °1. Increase activity gradually.  DO NOT PUSH THROUGH PAIN °2. Use Ice and/or Heat °3. Try Gentle Massage and/or Stretching °4. Take over the counter pain medication °5. Take Narcotic prescription pain medication for more severe pain ° °Good pain control = faster recovery.  It is better to take more medicine to be more active than to stay in bed all day to avoid medications. °1.  Increase activity gradually °Avoid heavy lifting at first, then increase to lifting as tolerated over the next 6 weeks. °Do not “push through” the pain.  Listen to your body and avoid positions and maneuvers than reproduce the pain.  Wait a few days before trying something more intense °Walking an hour a day is encouraged to help your body recover faster   and more safely.  Start slowly and stop when getting sore.  If you can walk 30 minutes without stopping or pain, you can try more intense activity (running, jogging, aerobics, cycling, swimming, treadmill, sex, sports, weightlifting, etc.) °Remember: If it hurts to do it, then don’t do it! °2. Use Ice and/or Heat °You will have swelling and bruising around the incisions.  This will take several weeks to resolve. °Ice packs or heating pads (6-8 times a day, 30-60 minutes at a time) will help sooth soreness & bruising. °Some people prefer to use ice alone, heat alone, or alternate between ice & heat.  Experiment and see what works best for you.  Consider trying ice for the first few days to help decrease swelling and bruising; then, switch to heat to help relax sore spots and speed recovery. °Shower every day.  Short baths are fine.  It feels good!  Keep the incisions and wounds clean with soap & water.   °3. Try Gentle Massage and/or Stretching °Massage at the area of pain many times a day °Stop if you feel pain - do not  overdo it °4. Take over the counter pain medication °This helps the muscle and nerve tissues become less irritable and calm down faster °Choose ONE of the following over-the-counter anti-inflammatory medications: °Acetaminophen 500mg tabs (Tylenol) 1-2 pills with every meal and just before bedtime (avoid if you have liver problems or if you have acetaminophen in you narcotic prescription) °Naproxen 220mg tabs (ex. Aleve, Naprosyn) 1-2 pills twice a day (avoid if you have kidney, stomach, IBD, or bleeding problems) °Ibuprofen 200mg tabs (ex. Advil, Motrin) 3-4 pills with every meal and just before bedtime (avoid if you have kidney, stomach, IBD, or bleeding problems) °Take with food/snack several times a day as directed for at least 2 weeks to help keep pain / soreness down & more manageable. °5. Take Narcotic prescription pain medication for more severe pain °A prescription for strong pain control is often given to you upon discharge (for example: oxycodone/Percocet, hydrocodone/Norco/Vicodin, or tramadol/Ultram) °Take your pain medication as prescribed. °Be mindful that most narcotic prescriptions contain Tylenol (acetaminophen) as well - avoid taking too much Tylenol. °If you are having problems/concerns with the prescription medicine (does not control pain, nausea, vomiting, rash, itching, etc.), please call us [phone] to see if we need to switch you to a different pain medicine that will work better for you and/or control your side effects better. °If you need a refill on your pain medication, you must call the office before 4 pm and on weekdays only.  By federal law, prescriptions for narcotics cannot be called into a pharmacy.  They must be filled out on paper & picked up from our office by the patient or authorized caretaker.  Prescriptions cannot be filled after 4 pm nor on weekends.   ° °WHEN TO CALL US [phone] °Severe uncontrolled or worsening pain  °Fever over 101 F (38.5 C) °Concerns with  the incision: Worsening pain, redness, rash/hives, swelling, bleeding, or drainage °Reactions / problems with new medications (itching, rash, hives, nausea, etc.) °Nausea and/or vomiting °Difficulty urinating °Difficulty breathing °Worsening fatigue, dizziness, lightheadedness, blurred vision °Other concerns °If you are not getting better after two weeks or are noticing you are getting worse, contact our office [phone] for further advice.  We may need to adjust your medications, re-evaluate you in the office, send you to the emergency room, or see what other things we can do to help. °The   clinic staff is available to answer your questions during regular business hours (8:30am-5pm).  Please don’t hesitate to call and ask to speak to one of our nurses for clinical concerns.    °A surgeon from [facility] is always on call at the hospitals 24 hours/day °If you have a medical emergency, go to the nearest emergency room or call 911. ° °FOLLOW UP in our office °One the day of your discharge from the hospital (or the next business weekday), please call [facility] to set up or confirm an appointment to see your surgeon in the office for a follow-up appointment.  Usually it is 2-3 weeks after your surgery.   °If you have skin staples at your incision(s), let the office know so we can set up a time in the office for the nurse to remove them (usually around 10 days after surgery). °Make sure that you call for appointments the day of discharge (or the next business weekday) from the hospital to ensure a convenient appointment time. °IF YOU HAVE DISABILITY OR FAMILY LEAVE FORMS, BRING THEM TO THE OFFICE FOR PROCESSING.  DO NOT GIVE THEM TO YOUR DOCTOR. ° °[facility] °[address], [address], [place], [place]  [address] ? °[phone] - Main °[phone] - Toll Free,  [phone] - Fax °[email or web address] ° °GETTING TO GOOD BOWEL HEALTH. °It is  expected for your digestive tract to need a few months to get back to normal.  It is common for your bowel movements and stools to be irregular.  You will have occasional bloating and cramping that should eventually fade away.  Until you are eating solid food normally, off all pain medications, and back to regular activities; your bowels will not be normal.   °Avoiding constipation °The goal: ONE SOFT BOWEL MOVEMENT A DAY!    °Drink plenty of fluids.  Choose water first. °TAKE A FIBER SUPPLEMENT EVERY DAY THE REST OF YOUR LIFE °During your first week back home, gradually add back a fiber supplement every day °Experiment which form you can tolerate.   There are many forms such as powders, tablets, wafers, gummies, etc °Psyllium bran (Metamucil), methylcellulose (Citrucel), Miralax or Glycolax, Benefiber, Flax Seed.  °Adjust the dose week-by-week (1/2 dose/day to 6 doses a day) until you are moving your bowels 1-2 times a day.  Cut back the dose or try a different fiber product if it is giving you problems such as diarrhea or bloating. °Sometimes a laxative is needed to help jump-start bowels if constipated until the fiber supplement can help regulate your bowels.  If you are tolerating eating & you are farting, it is okay to try a gentle laxative such as double dose MiraLax, prune juice, or Milk of Magnesia.  Avoid using laxatives too often. °Stool softeners can sometimes help counteract the constipating effects of narcotic pain medicines.  It can also cause diarrhea, so avoid using for too long. °If you are still constipated despite taking fiber daily, eating solids, and a few doses of laxatives, call our office. °Controlling diarrhea °Try drinking liquids and eating bland foods for a few days to avoid stressing your intestines further. °Avoid dairy products (especially milk & ice cream) for a short time.  The intestines often can lose the ability to digest lactose when stressed. °Avoid foods that cause gassiness or  bloating.  Typical foods include beans and other legumes, cabbage, broccoli, and dairy foods.  Avoid greasy, spicy, fast foods.  Every person has   some sensitivity to other foods, so listen to your body and avoid those foods that trigger problems for you. °Probiotics (such as active yogurt, Align, etc) may help repopulate the intestines and colon with normal bacteria and calm down a sensitive digestive tract °Adding a fiber supplement gradually can help thicken stools by absorbing excess fluid and retrain the intestines to act more normally.  Slowly increase the dose over a few weeks.  Too much fiber too soon can backfire and cause cramping & bloating. °It is okay to try and slow down diarrhea with a few doses of antidiarrheal medicines.   °Bismuth subsalicylate (ex. Kayopectate, Pepto Bismol) for a few doses can help control diarrhea.  Avoid if pregnant.   °Loperamide (Imodium) can slow down diarrhea.  Start with one tablet (2mg) first.  Avoid if you are having fevers or severe pain.  °ILEOSTOMY PATIENTS WILL HAVE CHRONIC DIARRHEA since their colon is not in use.    °Drink plenty of liquids.  You will need to drink even more glasses of water/liquid a day to avoid getting dehydrated. °Record output from your ileostomy.  Expect to empty the bag every 3-4 hours at first.  Most people with a permanent ileostomy empty their bag 4-6 times at the least.   °Use antidiarrheal medicine (especially Imodium) several times a day to avoid getting dehydrated.  Start with a dose at bedtime & breakfast.  Adjust up or down as needed.  Increase antidiarrheal medications as directed to avoid emptying the bag more than 8 times a day (every 3 hours). °Work with your wound ostomy nurse to learn care for your ostomy.  See ostomy care instructions. °TROUBLESHOOTING IRREGULAR BOWELS °1) Start with a soft & bland diet. No spicy, greasy, or fried foods.  °2) Avoid gluten/wheat or dairy products from diet to see if symptoms improve. °3) Miralax  17gm or flax seed mixed in 8oz. water or juice-daily. May use 2-4 times a day as needed. °4) Gas-X, Phazyme, etc. as needed for gas & bloating.  °5) Prilosec (omeprazole) over-the-counter as needed °6)  Consider probiotics (Align, Activa, etc) to help calm the bowels down ° °Call your doctor if you are getting worse or not getting better.  Sometimes further testing (cultures, endoscopy, X-ray studies, CT scans, bloodwork, etc.) may be needed to help diagnose and treat the cause of the diarrhea. °[facility] °[address], [address], [place], [place]  [address] °[phone] - Main.    °[phone]  - Toll Free.   [phone] - Fax °[email or web address] ° ° °

## 2017-02-09 NOTE — Progress Notes (Signed)
PHARMACIST - PHYSICIAN ORDER COMMUNICATION  CONCERNING: P&T Medication Policy on Herbal Medications  DESCRIPTION:  This patient's order for: Co-enzyme Q-10 has been noted.  This product(s) is classified as an "herbal" or natural product. Due to a lack of definitive safety studies or FDA approval, nonstandard manufacturing practices, plus the potential risk of unknown drug-drug interactions while on inpatient medications, the Pharmacy and Therapeutics Committee does not permit the use of "herbal" or natural products of this type within [facility].   ACTION TAKEN: The pharmacy department is unable to verify this order at this time and your patient has been informed of this safety policy. Please reevaluate patient's clinical condition at discharge and address if the herbal or natural product(s) should be resumed at that time.   Lindell Spar, PharmD, BCPS Pager: [phone] 02/09/2017 1:39 PM

## 2017-02-10 LAB — MAGNESIUM: MAGNESIUM: 1.9 mg/dL (ref 1.7–2.4)

## 2017-02-10 LAB — BASIC METABOLIC PANEL
Anion gap: 6 (ref 5–15)
BUN: 10 mg/dL (ref 6–20)
CALCIUM: 8.2 mg/dL — AB (ref 8.9–10.3)
CHLORIDE: 107 mmol/L (ref 101–111)
CO2: 25 mmol/L (ref 22–32)
CREATININE: 0.66 mg/dL (ref 0.44–1.00)
GFR calc Af Amer: >60 mL/min (ref >60)
GFR calc non Af Amer: >60 mL/min (ref >60)
Glucose, Bld: 104 mg/dL — ABNORMAL HIGH (ref 65–99)
Potassium: 3.8 mmol/L (ref 3.5–5.1)
Sodium: 138 mmol/L (ref 135–145)

## 2017-02-10 LAB — CBC
HCT: 35.9 % — ABNORMAL LOW (ref 36.0–46.0)
Hemoglobin: 12.2 g/dL (ref 12.0–15.0)
MCH: 33.4 pg (ref 26.0–34.0)
MCHC: 34 g/dL (ref 30.0–36.0)
MCV: 98.4 fL (ref 78.0–100.0)
PLATELETS: 205 10*3/uL (ref 150–400)
RBC: 3.65 MIL/uL — ABNORMAL LOW (ref 3.87–5.11)
RDW: 14.7 % (ref 11.5–15.5)
WBC: 12 10*3/uL — AB (ref 4.0–10.5)

## 2017-02-10 MED ORDER — SODIUM CHLORIDE 0.9% FLUSH: 3.0000 mL | INTRAVENOUS | Status: DC | PRN

## 2017-02-10 MED ORDER — SODIUM CHLORIDE 0.9 % IV SOLN: 250.0000 mL | INTRAVENOUS | Status: DC | PRN

## 2017-02-10 MED ORDER — SODIUM CHLORIDE 0.9% FLUSH
3.0000 mL | Freq: Two times a day (BID) | INTRAVENOUS | Status: DC
  Administered 2017-02-10 – 2017-02-11 (×2): 3 mL via INTRAVENOUS

## 2017-02-10 MED ORDER — BISMUTH SUBSALICYLATE 262 MG/15ML PO SUSP
30.0000 mL | Freq: Four times a day (QID) | ORAL | Status: DC | PRN
  Administered 2017-02-10 – 2017-02-11 (×2): 30 mL via ORAL
  Filled 2017-02-10: qty 236

## 2017-02-10 NOTE — Progress Notes (Signed)
Paged Dr. Johney Maine re: pt's loose stools and soft BP.   Dr. Johney Maine returned page; VO for 30 mL Pepto Bismal Q6 hours prn for loose stools; no Immodium. Give dose now and see if it helps. Advance diet to heart healthy. Ok to use bedside commode. Use fluids for BP if needed-orders are in. Give reassurance and time. VORB Dr. Iris Pert, RN 02/10/17 4:48 PM   Orders placed for Pepto Bismal and diet per above. Will notify pt and family of plan and take action.

## 2017-02-10 NOTE — Progress Notes (Signed)
[address] [place], [place] [address] Phone: [phone]  FAX: [place] [phone] 04-17-1946  CARE TEAM:  PCP: [name], MD  Outpatient Care Team: Patient Care Team: [name], MD as PCP - General (Family Medicine) Melissa Montane, MD as Consulting Physician (Otolaryngology) Gatha Mayer, MD as Consulting Physician (Gastroenterology) Dr Shanon Brow Daughtry (Optometry) Michael Boston, MD as Consulting Physician (General Surgery) Love, Alyson Locket, MD as Consulting Physician (Neurology) Bernell List, CPhT as [facility] (Pharmacy Technician)  Inpatient Treatment Team: Treatment Team: Attending Provider: Michael Boston, MD; Technician: Leda Quail, NT   Problem List:   Principal Problem:   Polyp of sigmoid colon s/p colectomy 02/09/2017 Active Problems:   Hyperlipidemia, mixed   Essential hypertension   Obesity   Head tremor   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  More than 50% of that time was spent in counseling.  I updated the patient's status to the patient.  Recommendations were made.  Questions were answered.  She expressed understanding & appreciation.   [name], M.D., F.A.C.S. Gastrointestinal and Minimally Invasive Surgery [facility] [address] [place], [place] [address] [phone] Main / Paging   02/10/2017    Subjective: (Chief complaint)  Denies pain Walked in hallway Tol liquids  Objective:  Vital signs:  Vitals:   02/09/17 2126 02/10/17 0140 02/10/17 0503 02/10/17 0620  BP: (!) 104/55 102/83 (!) 91/45 (!) 107/53  Pulse: 63 68 64   Resp: _0 Temp: 97.7 F (36.5 C) 97.6 F (36.4 C) 98.2 F (36.8 C)   TempSrc: Oral Oral Oral   SpO2: 96% 97% 95%   Weight:   84.2 kg (185 lb 10 oz)     Last BM Date: 02/09/17  Intake/Output   Yesterday:  06/20 0701 - 06/21 0700 In: 2977.5 [I.V.:2927.5; IV Piggyback:50] Out: 6659 [Urine:1580; Blood:150] This shift:  No intake/output data recorded.  Bowel function:  Flatus: YES  BM:  YES  Drain: (No drain)   Physical Exam:  General: Pt awake/alert/oriented x4 in no acute distress Eyes: PERRL, normal EOM.  Sclera clear.  No icterus Neuro: CN II-XII intact w/o focal sensory/motor deficits. Lymph: No head/neck/groin lymphadenopathy Psych:  No delerium/psychosis/paranoia HENT: Normocephalic, Mucus membranes moist.  No thrush Neck: Supple, No tracheal deviation Chest:  No chest wall pain w good excursion CV:  Pulses intact.  Regular rhythm MS: Normal AROM mjr joints.  No obvious deformity  Abdomen: Soft.  Nondistended.  Mildly tender at incisions only.  No evidence of peritonitis.  No incarcerated hernias.  Ext:   No deformity.  No mjr edema.  No cyanosis Skin: No petechiae / purpura  Results:   Labs: Results for orders placed or performed during the hospital encounter of 02/09/17 (from the past 48 hour(s))  Basic metabolic panel  Status: Abnormal   Collection Time: 02/10/17  5:19 AM  Result Value Ref Range   Sodium 138 135 - 145 mmol/L   Potassium 3.8 3.5 - 5.1 mmol/L   Chloride 107 101 - 111 mmol/L   CO2 25 22 - 32 mmol/L   Glucose, Bld 104 (H) 65 - 99 mg/dL   BUN 10 6 - 20 mg/dL   Creatinine,  Ser 0.66 0.44 - 1.00 mg/dL   Calcium 8.2 (L) 8.9 - 10.3 mg/dL   GFR calc non Af Amer >60 >60 mL/min   GFR calc Af Amer >60 >60 mL/min    Comment: (NOTE) The eGFR has been calculated using the CKD EPI equation. This calculation has not been validated in all clinical situations. eGFR's persistently <60 mL/min signify possible Chronic Kidney Disease.    Anion gap 6 5 - 15  CBC     Status: Abnormal   Collection Time: 02/10/17  5:19 AM  Result Value Ref Range   WBC 12.0 (H) 4.0 - 10.5 K/uL   RBC 3.65 (L) 3.87 - 5.11 MIL/uL   Hemoglobin 12.2 12.0 - 15.0 g/dL   HCT 35.9 (L) 36.0 - 46.0 %   MCV 98.4 78.0 - 100.0 fL   MCH 33.4 26.0 - 34.0 pg   MCHC 34.0 30.0 - 36.0 g/dL   RDW 14.7 11.5 - 15.5 %   Platelets 205 150 - 400 K/uL  Magnesium     Status: None   Collection Time: 02/10/17  5:19 AM  Result Value Ref Range   Magnesium 1.9 1.7 - 2.4 mg/dL    Imaging / Studies: No results found.  Medications / Allergies: per chart  Antibiotics: Anti-infectives    Start     Dose/Rate Route Frequency Ordered Stop   02/09/17 1730  clindamycin (CLEOCIN) IVPB 900 mg     900 mg 100 mL/hr over 30 Minutes Intravenous Every 8 hours 02/09/17 1330 02/09/17 1816   02/09/17 1600  metroNIDAZOLE (FLAGYL) tablet 500 mg  Status:  Discontinued     500 mg Oral 3 times daily 02/09/17 1330 02/09/17 1340   02/09/17 1600  neomycin (MYCIFRADIN) tablet 500 mg  Status:  Discontinued     500 mg Oral 3 times daily 02/09/17 1330 02/09/17 1341   02/09/17 1016  clindamycin (CLEOCIN) 900 mg, gentamicin (GARAMYCIN) 240 mg in sodium chloride 0.9 % 1,000 mL for intraperitoneal lavage  Status:  Discontinued       As needed 02/09/17 1017 02/09/17 1151   02/09/17 0922  clindamycin (CLEOCIN) 900 MG/50ML IVPB    Comments:  Danley Danker   : cabinet override      02/09/17 0922 02/09/17 2129   02/09/17 0645  clindamycin (CLEOCIN) 900 mg, gentamicin (GARAMYCIN) 240 mg in sodium chloride 0.9 % 1,000 mL for intraperitoneal lavage       Intraperitoneal To Surgery 02/09/17 0638 02/09/17 0954   02/09/17 0638  neomycin (MYCIFRADIN) tablet 1,000 mg  Status:  Discontinued     1,000 mg Oral 3 times per day 02/09/17 0638 02/09/17 1255   02/09/17 0638  metroNIDAZOLE (FLAGYL) tablet 1,000 mg  Status:  Discontinued     1,000 mg Oral 3 times per day 02/09/17 0638 02/09/17 1255   02/09/17 0600  gentamicin (GARAMYCIN) 340 mg in dextrose 5 % 100 mL IVPB     5 mg/kg  68.4 kg (Adjusted) 108.5 mL/hr over 60 Minutes Intravenous 60 min pre-op 02/08/17 1114 02/09/17 0920        Note: Portions of  this report may have been transcribed using voice recognition software. Every effort was made to ensure accuracy; however, inadvertent computerized transcription errors may be present.   Any transcriptional errors that result from this process are unintentional.     [name], M.D., F.A.C.S. Gastrointestinal and Minimally Invasive Surgery [facility] [address], [place], [place] [address] [phone] Main / Paging   02/10/2017

## 2017-02-11 MED ORDER — LOPERAMIDE HCL 2 MG PO CAPS: 2.0000 mg | ORAL_CAPSULE | Freq: Three times a day (TID) | ORAL | Status: DC | PRN

## 2017-02-11 MED ORDER — PSYLLIUM 95 % PO PACK
1.0000 | PACK | Freq: Every day | ORAL | Status: DC
  Administered 2017-02-11: 1 via ORAL
  Filled 2017-02-11 (×2): qty 1

## 2017-02-11 MED ORDER — FERROUS SULFATE 325 (65 FE) MG PO TABS
325.0000 mg | ORAL_TABLET | Freq: Two times a day (BID) | ORAL | Status: DC
  Administered 2017-02-11 – 2017-02-12 (×3): 325 mg via ORAL
  Filled 2017-02-11 (×3): qty 1

## 2017-02-11 NOTE — Progress Notes (Signed)
Per Dr.Gross remove foley catheter, patient may shower and ambulate

## 2017-02-11 NOTE — Progress Notes (Signed)
Patient status post robotic sigmoid colectomy for stricture and reactive polyp. Path benign.  Polyp actually inflammatory reaction to diverticulitis and stricture.  No adenomatous changes.  No cancer. I told the pt the good news  Alisha, make sure patient has postoperative appointment to see me in 2-3 weeks

## 2017-02-11 NOTE — Progress Notes (Signed)
[address] [place], [place] [address] Phone: [phone]  FAX: [place] [phone] Mar 15, 1946  CARE TEAM:  PCP: [name], MD  Outpatient Care Team: Patient Care Team: [name], MD as PCP - General (Family Medicine) Melissa Montane, MD as Consulting Physician (Otolaryngology) Gatha Mayer, MD as Consulting Physician (Gastroenterology) Dr Shanon Brow Daughtry (Optometry) Michael Boston, MD as Consulting Physician (General Surgery) Love, Alyson Locket, MD as Consulting Physician (Neurology) Bernell List, CPhT as [facility] Management (Pharmacy Technician)  Inpatient Treatment Team: Treatment Team: Attending Provider: Michael Boston, MD; Technician: Leda Quail, NT; Registered Nurse: Darrell Jewel, RN; Registered Nurse: Vilma Meckel, RN   Problem List:   Principal Problem:   Polyp of sigmoid colon s/p colectomy 02/09/2017 Active Problems:   Hyperlipidemia, mixed   Essential hypertension   Obesity   Head tremor   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  No cancer or tumor.  D/w patient & spouse.  The pathology report is in the room -solid diet -remove foley -bowel regimen - slow diarrhea gradually (worst past, I believe) -follow off IVF -control HTN -VTE prophylaxis- SCDs, etc -mobilize as tolerated to help recovery  D/C patient from hospital when patient meets criteria (anticipate in 1 day):  Tolerating oral intake well Ambulating well Adequate pain control without IV medications Urinating  Having flatus Disposition planning in place   15 minutes spent in review, evaluation, examination, counseling,  and coordination of care.  More than 50% of that time was spent in counseling.  I updated the patient's status to the patient and spouse.  Recommendations were made.  Questions were answered.  They expressed understanding & appreciation.   Renee [name], M.D., F.A.C.S. Gastrointestinal and Minimally Invasive Surgery [facility] [address], [place], [place] [address] [phone] Main / Paging   02/11/2017    Subjective: (Chief complaint)  Denies pain MAny loose BMs yesterday afternoon.  Much less after Pepto x 1 & adv to solid diet Tol solids Slept better  Walked in hallway   Objective:  Vital signs:  Vitals:   02/10/17 1807 02/10/17 2207 02/11/17 0220 02/11/17 0633  BP: 125/72 114/84 116/67 125/65  Pulse: 78 69 94 92  Resp: _0 Temp: 97.8 F (36.6 C) 98.3 F (36.8 C) 98.8 F (37.1 C) 99.4 F (37.4 C)  TempSrc: Oral Oral Oral Oral  SpO2: 100% 95% 95% 96%  Weight:        Last BM Date: 02/10/17  Intake/Output   Yesterday:  06/21 0701 - 06/22 0700 In: 51 [P.O.:590; I.V.:20] Out: 2675 [Urine:2675] This shift:  Total I/O In: -  Out: 1750 [Urine:1750]  Bowel function:  Flatus: YES  BM:  YES  Drain: (No drain)   Physical Exam:  General: Pt awake/alert/oriented x4 in no acute distress.  Brushing teeth in bathroom, standing up unassisted (walker nearby) Eyes: PERRL, normal EOM.  Sclera clear.  No icterus Neuro: CN II-XII intact w/o focal sensory/motor deficits. Lymph: No head/neck/groin lymphadenopathy Psych:  No delerium/psychosis/paranoia HENT: Normocephalic, Mucus membranes moist.  No thrush Neck: Supple, No tracheal deviation Chest:  No chest wall pain w good excursion  CV:  Pulses intact.  Regular rhythm MS: Normal AROM mjr joints.  No obvious deformity  Abdomen: Soft.  Nondistended.  Nontender.  Dressings clean.  No evidence of peritonitis.  No incarcerated hernias.  Ext:   No deformity.  No mjr  edema.  No cyanosis Skin: No petechiae / purpura  Results:    Diagnosis 1. Colon, segmental resection, rectosigmoid - DIVERTICULITIS WITH PERICOLONIC ABSCESS AND FIBROMUSCULAR HYPERTROPHY CONSISTENT WITH STRICTURE. - DIVERTICULITIS RELATED REACTIVE MUCOSAL POLYP. - NO EVIDENCE OF MALIGNANCY. 2. Colon, resection margin (donut), distal - BENIGN COLON. - NO EVIDENCE OF MALIGNANCY. Microscopic Comment 1. There is diverticulitis with pericolonic abscess and extensive fibromuscular hypertrophy consistent with stricture. There is a 2.1 cm area of slightly polypoid mucosa, which histologically shows submucosal edema with hypervascularity and this most likely represents a diverticulitis related reactive mucosal polyp. There are dilated, focally tortuous vessels within the polyp and this raises the possibility of a benign arterial venous malformation. This change, however, is patchy and hypervascularity is most likely reactive. (JDP:ecj 02/10/2017) Claudette Laws MD Pathologist, Electronic Signature Labs: Results for orders placed or performed during the hospital encounter of 02/09/17 (from the past 48 hour(s))  Basic metabolic panel     Status: Abnormal   Collection Time: 02/10/17  5:19 AM  Result Value Ref Range   Sodium 138 135 - 145 mmol/L   Potassium 3.8 3.5 - 5.1 mmol/L   Chloride 107 101 - 111 mmol/L   CO2 25 22 - 32 mmol/L   Glucose, Bld 104 (H) 65 - 99 mg/dL   BUN 10 6 - 20 mg/dL   Creatinine, Ser 0.66 0.44 - 1.00 mg/dL   Calcium 8.2 (L) 8.9 - 10.3 mg/dL   GFR calc non Af Amer >60 >60 mL/min   GFR calc Af Amer >60 >60 mL/min    Comment: (NOTE) The eGFR has been calculated using the CKD EPI equation. This calculation has not been validated in all clinical situations. eGFR's persistently <60 mL/min signify possible Chronic Kidney Disease.    Anion gap 6 5 - 15  CBC     Status: Abnormal   Collection Time: 02/10/17  5:19 AM  Result Value Ref Range   WBC 12.0 (H) 4.0 - 10.5  K/uL   RBC 3.65 (L) 3.87 - 5.11 MIL/uL   Hemoglobin 12.2 12.0 - 15.0 g/dL   HCT 35.9 (L) 36.0 - 46.0 %   MCV 98.4 78.0 - 100.0 fL   MCH 33.4 26.0 - 34.0 pg   MCHC 34.0 30.0 - 36.0 g/dL   RDW 14.7 11.5 - 15.5 %   Platelets 205 150 - 400 K/uL  Magnesium     Status: None   Collection Time: 02/10/17  5:19 AM  Result Value Ref Range   Magnesium 1.9 1.7 - 2.4 mg/dL    Imaging / Studies: No results found.  Medications / Allergies: per chart  Antibiotics: Anti-infectives    Start     Dose/Rate Route Frequency Ordered Stop   02/09/17 1730  clindamycin (CLEOCIN) IVPB 900 mg     900 mg 100 mL/hr over 30 Minutes Intravenous Every 8 hours 02/09/17 1330 02/09/17 1816   02/09/17 1600  metroNIDAZOLE (FLAGYL) tablet 500 mg  Status:  Discontinued     500 mg Oral 3 times daily 02/09/17 1330 02/09/17 1340   02/09/17 1600  neomycin (MYCIFRADIN) tablet 500 mg  Status:  Discontinued     500 mg Oral 3 times daily 02/09/17 1330 02/09/17 1341   02/09/17  1016  clindamycin (CLEOCIN) 900 mg, gentamicin (GARAMYCIN) 240 mg in sodium chloride 0.9 % 1,000 mL for intraperitoneal lavage  Status:  Discontinued       As needed 02/09/17 1017 02/09/17 1151   02/09/17 0922  clindamycin (CLEOCIN) 900 MG/50ML IVPB    Comments:  Danley Danker   : cabinet override      02/09/17 0922 02/09/17 2129   02/09/17 0645  clindamycin (CLEOCIN) 900 mg, gentamicin (GARAMYCIN) 240 mg in sodium chloride 0.9 % 1,000 mL for intraperitoneal lavage      Intraperitoneal To Surgery 02/09/17 0638 02/09/17 0954   02/09/17 0638  neomycin (MYCIFRADIN) tablet 1,000 mg  Status:  Discontinued     1,000 mg Oral 3 times per day 02/09/17 4327 02/09/17 1255   02/09/17 0638  metroNIDAZOLE (FLAGYL) tablet 1,000 mg  Status:  Discontinued     1,000 mg Oral 3 times per day 02/09/17 0638 02/09/17 1255   02/09/17 0600  gentamicin (GARAMYCIN) 340 mg in dextrose 5 % 100 mL IVPB     5 mg/kg  68.4 kg (Adjusted) 108.5 mL/hr over 60 Minutes Intravenous 60  min pre-op 02/08/17 1114 02/09/17 0920        Note: Portions of this report may have been transcribed using voice recognition software. Every effort was made to ensure accuracy; however, inadvertent computerized transcription errors may be present.   Any transcriptional errors that result from this process are unintentional.     Renee [name], M.D., F.A.C.S. Gastrointestinal and Minimally Invasive Surgery [facility] [address] [place], [place] [address] [phone] Main / Paging   02/11/2017

## 2017-02-12 NOTE — Progress Notes (Signed)
Nurse reviewed discharge instructions with pt.   Pt verbalized understanding of discharge instructions, follow up appointment and new medication.  Prescription given to pt prior to discharge. 

## 2017-02-12 NOTE — Discharge Summary (Signed)
Physician Discharge Summary  Patient ID: Renee [name] MRN: [id number] DOB/AGE: February 18, 1946  71 y.o.  Admit date: 02/09/2017 Discharge date:   Patient Care Team: [name], MD as PCP - General (Family Medicine) Melissa Montane, MD as Consulting Physician (Otolaryngology) Gatha Mayer, MD as Consulting Physician (Gastroenterology) Dr Shanon Brow Daughtry (Optometry) Michael Boston, MD as Consulting Physician (General Surgery) Love, Alyson Locket, MD as Consulting Physician (Neurology) Bernell List, CPhT as [place] (Pharmacy Technician)  Discharge Diagnoses:  Principal Problem:   Polyp of sigmoid colon s/p colectomy 02/09/2017 Active Problems:   Hyperlipidemia, mixed   Essential hypertension   Obesity   Head tremor   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   POST-OPERATIVE DIAGNOSIS:   Sigmoid polyp, Recto Sigmoid stricture  SURGERY:  02/09/2017  Procedure(s): XI ROBOTIC RESECTION OF SIGMOID COLON, RIGID PROCTOSCOPY  SURGEON:    Surgeon(s): Michael Boston, MD Leighton Ruff, MD  Consults: None  Hospital Course:   The patient underwent the surgery above.  Postoperatively, the patient gradually mobilized and advanced to a solid diet.  Pain and other symptoms were treated aggressively.   Postop day one she had a lot of diarrhea.  This quickly resolved and improved with a more aggressive bowel regimen.   By the time of discharge, the patient was walking well the hallways, eating food, having flatus.  Pain was well-controlled on an oral medications.  Based on meeting discharge criteria and continuing to recover, I felt it was safe for the patient to be discharged from the hospital to further recover with close followup. Postoperative recommendations were discussed in detail.  They are written as well.  Discharged Condition: good  Disposition:  Follow-up Information    Michael Boston, MD. Schedule an appointment as soon as possible  for a visit in 2 weeks.   Specialty:  General Surgery Why:  To follow up after your operation, To follow up after your hospital stay Contact information: [place] [address] [phone]           Final discharge disposition not confirmed  Discharge Instructions    Call MD for:    Complete by:  As directed    FEVER > 101.5 F  (temperatures < 101.5 F are not significant)   Call MD for:    Complete by:  As directed    FEVER > 101.5 F  (temperatures < 101.5 F are not significant)   Call MD for:  extreme fatigue    Complete by:  As directed    Call MD for:  extreme fatigue    Complete by:  As directed    Call MD for:  persistant dizziness or light-headedness    Complete by:  As directed    Call MD for:  persistant dizziness or light-headedness    Complete by:  As directed    Call MD for:  persistant nausea and vomiting    Complete by:  As directed    Call MD for:  persistant nausea and vomiting    Complete by:  As directed    Call MD for:  redness, tenderness, or signs of infection (pain, swelling, redness, odor or green/yellow discharge around incision site)    Complete by:  As directed    Call MD for:  redness, tenderness, or signs of infection (pain, swelling, redness, odor or green/yellow discharge around incision site)    Complete by:  As directed    Call MD  for:  severe uncontrolled pain    Complete by:  As directed    Call MD for:  severe uncontrolled pain    Complete by:  As directed    Diet - low sodium heart healthy    Complete by:  As directed    Follow a light diet the first few days at home.   Start with a bland diet such as soups, liquids, starchy foods, low fat foods, etc.   If you feel full, bloated, or constipated, stay on a full liquid or pureed/blenderized diet for a few days until you feel better and no longer constipated. Be sure to drink plenty of fluids every day to avoid getting dehydrated (feeling dizzy, not urinating,  etc.). Gradually add a fiber supplement to your diet   Diet - low sodium heart healthy    Complete by:  As directed    Follow a light diet the first few days at home.   Start with a bland diet such as soups, liquids, starchy foods, low fat foods, etc.   If you feel full, bloated, or constipated, stay on a full liquid or pureed/blenderized diet for a few days until you feel better and no longer constipated. Be sure to drink plenty of fluids every day to avoid getting dehydrated (feeling dizzy, not urinating, etc.). Gradually add a fiber supplement to your diet   Discharge instructions    Complete by:  As directed    See Discharge Instructions If you are not getting better after two weeks or are noticing you are getting worse, contact our office [phone] for further advice.  We may need to adjust your medications, re-evaluate you in the office, send you to the emergency room, or see what other things we can do to help. The clinic staff is available to answer your questions during regular business hours (8:30am-5pm).  Please don't hesitate to call and ask to speak to one of our nurses for clinical concerns.    A surgeon from [facility] is always on call at the hospitals 24 hours/day If you have a medical emergency, go to the nearest emergency room or call 911.   Discharge instructions    Complete by:  As directed    See Discharge Instructions If you are not getting better after two weeks or are noticing you are getting worse, contact our office [phone] for further advice.  We may need to adjust your medications, re-evaluate you in the office, send you to the emergency room, or see what other things we can do to help. The clinic staff is available to answer your questions during regular business hours (8:30am-5pm).  Please don't hesitate to call and ask to speak to one of our nurses for clinical concerns.    A surgeon from [facility] is always on call at the  hospitals 24 hours/day If you have a medical emergency, go to the nearest emergency room or call 911.   Driving Restrictions    Complete by:  As directed    You may drive when you are no longer taking narcotic prescription pain medication, you can comfortably wear a seatbelt, and you can safely make sudden turns/stops to protect yourself without hesitating due to pain.   Driving Restrictions    Complete by:  As directed    You may drive when you are no longer taking narcotic prescription pain medication, you can comfortably wear a seatbelt, and you can safely make sudden turns/stops to protect  yourself without hesitating due to pain.   Increase activity slowly    Complete by:  As directed    Start light daily activities --- self-care, walking, climbing stairs- beginning the day after surgery.  Gradually increase activities as tolerated.  Control your pain to be active.  Stop when you are tired.  Ideally, walk several times a day, eventually an hour a day.   Most people are back to most day-to-day activities in a few weeks.  It takes 4-8 weeks to get back to unrestricted, intense activity. If you can walk 30 minutes without difficulty, it is safe to try more intense activity such as jogging, treadmill, bicycling, low-impact aerobics, swimming, etc. Save the most intensive and strenuous activity for last (Usually 4-8 weeks after surgery) such as sit-ups, heavy lifting, contact sports, etc.  Refrain from any intense heavy lifting or straining until you are off narcotics for pain control.  You will have off days, but things should improve week-by-week. DO NOT PUSH THROUGH PAIN.  Let pain be your guide: If it hurts to do something, don't do it.  Pain is your body warning you to avoid that activity for another week until the pain goes down.   Increase activity slowly    Complete by:  As directed    Start light daily activities --- self-care, walking, climbing stairs- beginning the day after surgery.   Gradually increase activities as tolerated.  Control your pain to be active.  Stop when you are tired.  Ideally, walk several times a day, eventually an hour a day.   Most people are back to most day-to-day activities in a few weeks.  It takes 4-8 weeks to get back to unrestricted, intense activity. If you can walk 30 minutes without difficulty, it is safe to try more intense activity such as jogging, treadmill, bicycling, low-impact aerobics, swimming, etc. Save the most intensive and strenuous activity for last (Usually 4-8 weeks after surgery) such as sit-ups, heavy lifting, contact sports, etc.  Refrain from any intense heavy lifting or straining until you are off narcotics for pain control.  You will have off days, but things should improve week-by-week. DO NOT PUSH THROUGH PAIN.  Let pain be your guide: If it hurts to do something, don't do it.  Pain is your body warning you to avoid that activity for another week until the pain goes down.   Lifting restrictions    Complete by:  As directed    If you can walk 30 minutes without difficulty, it is safe to try more intense activity such as jogging, treadmill, bicycling, low-impact aerobics, swimming, etc. Save the most intensive and strenuous activity for last (Usually 4-8 weeks after surgery) such as sit-ups, heavy lifting, contact sports, etc.  Refrain from any intense heavy lifting or straining until you are off narcotics for pain control.  You will have off days, but things should improve week-by-week. DO NOT PUSH THROUGH PAIN.  Let pain be your guide: If it hurts to do something, don't do it.  Pain is your body warning you to avoid that activity for another week until the pain goes down.   Lifting restrictions    Complete by:  As directed    If you can walk 30 minutes without difficulty, it is safe to try more intense activity such as jogging, treadmill, bicycling, low-impact aerobics, swimming, etc. Save the most intensive and strenuous  activity for last (Usually 4-8 weeks after surgery) such as sit-ups, heavy lifting, contact sports,  etc.  Refrain from any intense heavy lifting or straining until you are off narcotics for pain control.  You will have off days, but things should improve week-by-week. DO NOT PUSH THROUGH PAIN.  Let pain be your guide: If it hurts to do something, don't do it.  Pain is your body warning you to avoid that activity for another week until the pain goes down.   May walk up steps    Complete by:  As directed    May walk up steps    Complete by:  As directed    No wound care    Complete by:  As directed    It is good for closed incision and even open wounds to be washed every day.  Shower every day.  Short baths are fine.  Wash the incisions and wounds clean with soap & water.    If you have a closed incision(s), wash the incision with soap & water every day.  You may leave closed incisions open to air if it is dry.   You may cover the incision with clean gauze & replace it after your daily shower for comfort. If you have skin tapes (Steristrips) or skin glue (Dermabond) on your incision, leave them in place.  They will fall off on their own like a scab.  You may trim any edges that curl up with clean scissors.  If you have staples, set up an appointment for them to be removed in the office in 10 days after surgery.  If you have a drain, wash around the skin exit site with soap & water and place a new dressing of gauze or band aid around the skin every day.  Keep the drain site clean & dry.   Remove dressing in 48 hours    Complete by:  As directed    On Monday morning, remove all dressings and Band-Aids.  Remove shoelace wicks out of lower incision.  May cover with dry Band-Aids as needed.  Otherwise can leave open to air.  Okay to shower with dressings on.  Okay to shower with dressings off.  Okay to do baths starting Wednesday, 6/27   Sexual Activity Restrictions    Complete by:  As directed    You may  have sexual intercourse when it is comfortable. If it hurts to do something, stop.   Sexual Activity Restrictions    Complete by:  As directed    You may have sexual intercourse when it is comfortable. If it hurts to do something, stop.      Allergies as of 02/12/2017      Reactions   Amoxicillin    REACTION: TROUBLE BREATHING Has patient had a PCN reaction causing immediate rash, facial/tongue/throat swelling, SOB or lightheadedness with hypotension:Yes Has patient had a PCN reaction causing severe rash involving mucus membranes or skin necrosis:No Has patient had a PCN reaction that required hospitalization:No Has patient had a PCN reaction occurring within the last 10 years:No If all of the above answers are "NO", then may proceed with Cephalosporin use.   Proair Hfa [albuterol] Other (See Comments)   Hypersensitivity, anxious, disoriented   Penicillins Rash   Has patient had a PCN reaction causing immediate rash, facial/tongue/throat swelling, SOB or lightheadedness with hypotension:unknown Has patient had a PCN reaction causing severe rash involving mucus membranes or skin necrosis:unknown Has patient had a PCN reaction that required hospitalization:no Has patient had a PCN reaction occurring within the last 10 years:no If all of  the above answers are "NO", then may proceed with Cephalosporin use.      Medication List    STOP taking these medications   metroNIDAZOLE 500 MG tablet Commonly known as:  FLAGYL   neomycin 500 MG tablet Commonly known as:  MYCIFRADIN     TAKE these medications   atorvastatin 10 MG tablet Commonly known as:  LIPITOR TAKE 1 TABLET BY MOUTH EVERY TUESDAY AND SATURDAY   b complex vitamins tablet Take 1 tablet by mouth every other day.   Coenzyme Q10 200 MG capsule Take 200 mg by mouth every other day. 3 times per week   metoprolol succinate 25 MG 24 hr tablet Commonly known as:  TOPROL-XL Take 1 tablet (25 mg total) by mouth daily. What  changed:  when to take this   multivitamin with minerals Tabs tablet Take 1 tablet by mouth every other day.   PROBIOTIC-10 PO Take 1 capsule by mouth. Every Tuesday, Thursday, and Saturday in the am   ranitidine 300 MG capsule Commonly known as:  ZANTAC Take 1 capsule (300 mg total) by mouth every evening. What changed:  how much to take  when to take this  reasons to take this  additional instructions   traMADol 50 MG tablet Commonly known as:  ULTRAM Take 1-2 tablets (50-100 mg total) by mouth every 6 (six) hours as needed for moderate pain or severe pain.   VITAMIN D PO Take 1 tablet by mouth every other day. 3 times per week       Significant Diagnostic Studies:  Results for orders placed or performed during the hospital encounter of 02/09/17 (from the past 72 hour(s))  Basic metabolic panel     Status: Abnormal   Collection Time: 02/10/17  5:19 AM  Result Value Ref Range   Sodium 138 135 - 145 mmol/L   Potassium 3.8 3.5 - 5.1 mmol/L   Chloride 107 101 - 111 mmol/L   CO2 25 22 - 32 mmol/L   Glucose, Bld 104 (H) 65 - 99 mg/dL   BUN 10 6 - 20 mg/dL   Creatinine, Ser 0.66 0.44 - 1.00 mg/dL   Calcium 8.2 (L) 8.9 - 10.3 mg/dL   GFR calc non Af Amer >60 >60 mL/min   GFR calc Af Amer >60 >60 mL/min    Comment: (NOTE) The eGFR has been calculated using the CKD EPI equation. This calculation has not been validated in all clinical situations. eGFR's persistently <60 mL/min signify possible Chronic Kidney Disease.    Anion gap 6 5 - 15  CBC     Status: Abnormal   Collection Time: 02/10/17  5:19 AM  Result Value Ref Range   WBC 12.0 (H) 4.0 - 10.5 K/uL   RBC 3.65 (L) 3.87 - 5.11 MIL/uL   Hemoglobin 12.2 12.0 - 15.0 g/dL   HCT 35.9 (L) 36.0 - 46.0 %   MCV 98.4 78.0 - 100.0 fL   MCH 33.4 26.0 - 34.0 pg   MCHC 34.0 30.0 - 36.0 g/dL   RDW 14.7 11.5 - 15.5 %   Platelets 205 150 - 400 K/uL  Magnesium     Status: None   Collection Time: 02/10/17  5:19 AM  Result  Value Ref Range   Magnesium 1.9 1.7 - 2.4 mg/dL    No results found.  Discharge Exam: Blood pressure (!) 154/88, pulse 72, temperature 98.1 F (36.7 C), temperature source Oral, resp. rate 18, height 5' 6"  (1.676 m), weight 88.8 kg (195  lb 12.3 oz), SpO2 94 %.  General: Pt awake/alert/oriented x4 in No acute distress.  Sitting up.  Smiling. Eyes: PERRL, normal EOM.  Sclera clear.  No icterus Neuro: CN II-XII intact w/o focal sensory/motor deficits.  Moderate head tremor at baseline  Lymph: No head/neck/groin lymphadenopathy Psych:  No delerium/psychosis/paranoia HENT: Normocephalic, Mucus membranes moist.  No thrush Neck: Supple, No tracheal deviation Chest: No chest wall pain w good excursion CV:  Pulses intact.  Regular rhythm MS: Normal AROM mjr joints.  No obvious deformity Abdomen: Soft.  Nondistended.  Mildly tender at incisions only.  No evidence of peritonitis.  No incarcerated hernias. Ext:  SCDs BLE.  No mjr edema.  No cyanosis Skin: No petechiae / purpura  Past Medical History:  Diagnosis Date  . Allergic rhinitis   . 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 Concussion 1969  . GERD (gastroesophageal reflux disease)   . H/O tobacco use, presenting hazards to health 09/19/2008   Qualifier: Diagnosis of  By: Nena Jordan Smoking roughly 5 cig daily  Last cigarette was in December of 2016  . 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 Hyperglycemia 10/07/2016  . Hyperlipidemia   . Hypertension   . 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 Obesity 07/06/2016  . Occult blood positive stool   . Polyp of sigmoid colon 12/08/2016   11/2016 - Unable to remove or get bx at colonoscopy. Fixed sigmoid colon prevents. Referred for surgery.  Marland Kitchen PONV (postoperative nausea and vomiting)    ,   . Preventative health care 05/12/2013  . Tobacco abuse   .  Tremor, essential     Past Surgical History:  Procedure Laterality Date  . INNER EAR SURGERY  2015   with tube  . MOUTH SURGERY  01/22/11   lower denture secured by titanium bolts  . rectal suction biospy    . XI ROBOTIC ASSISTED LOWER ANTERIOR RESECTION N/A 02/09/2017   Procedure: XI ROBOTIC RESECTION OF SIGMOID COLON, RIGID PROCTOSCOPY;  Surgeon: Karie Soda, MD;  Location: [facility];  Service: General;  Laterality: N/A;    Social History   Social History  . Marital status: Married    Spouse name: N/A  . Number of children: N/A  . Years of education: N/A   Occupational History  . Not on file.   Social History Main Topics  . Smoking status: Former Smoker    Packs/day: 0.50    Types: Cigarettes    Quit date: 09/07/2015  . Smokeless tobacco: Never Used  . Alcohol use 8.4 oz/week    14 Glasses of wine per week     Comment: 3 glasses of wine daily   . Drug use: No  . Sexual activity: Not Currently   Other Topics Concern  . Not on file   Social History Narrative  . No narrative on file    Family History  Problem Relation Age of Onset  . Heart attack Father 23       deceased  . Stroke Mother   . Hypertension Mother   . Alcohol abuse Mother   . Obesity Daughter   . Heart disease Paternal Grandmother        AAA rupture  . Heart disease Paternal Grandfather        MI  .  Colon cancer Neg Hx   . Breast cancer Neg Hx   . Prostate cancer Neg Hx   . Diabetes Neg Hx     Current Facility-Administered Medications  Medication Dose Route Frequency Provider Last Rate Last Dose  . 0.9 %  sodium chloride infusion  250 mL Intravenous PRN Michael Boston, MD      . 0.9 %  sodium chloride infusion  250 mL Intravenous PRN Michael Boston, MD      . acetaminophen (TYLENOL) tablet 1,000 mg  1,000 mg Oral TID Michael Boston, MD   1,000 mg at 02/11/17 2142  . alum & mag hydroxide-simeth (MAALOX/MYLANTA) 200-200-20 MG/5ML suspension 30 mL  30 mL Oral Q6H PRN Michael Boston, MD      .  B-complex with vitamin C tablet 1 tablet  1 tablet Oral Carlis Abbott, MD   1 tablet at 02/11/17 0910  . bismuth subsalicylate (PEPTO BISMOL) 262 MG/15ML suspension 30 mL  30 mL Oral QID PRN Michael Boston, MD   30 mL at 02/11/17 0249  . diphenhydrAMINE (BENADRYL) 12.5 MG/5ML elixir 12.5 mg  12.5 mg Oral Q6H PRN Michael Boston, MD       Or  . diphenhydrAMINE (BENADRYL) injection 12.5 mg  12.5 mg Intravenous Q6H PRN Michael Boston, MD      . enoxaparin (LOVENOX) injection 40 mg  40 mg Subcutaneous Q24H Michael Boston, MD   40 mg at 02/11/17 0910  . feeding supplement (ENSURE SURGERY) liquid 237 mL  237 mL Oral BID BM Michael Boston, MD   237 mL at 02/10/17 1426  . ferrous sulfate tablet 325 mg  325 mg Oral BID WC Michael Boston, MD   325 mg at 02/11/17 1609  . guaiFENesin-dextromethorphan (ROBITUSSIN DM) 100-10 MG/5ML syrup 10 mL  10 mL Oral Q4H PRN Michael Boston, MD      . hydrocortisone (ANUSOL-HC) 2.5 % rectal cream 1 application  1 application Topical QID PRN Michael Boston, MD      . hydrocortisone cream 1 % 1 application  1 application Topical TID PRN Michael Boston, MD      . HYDROmorphone (DILAUDID) injection 0.5-2 mg  0.5-2 mg Intravenous Q2H PRN Michael Boston, MD   1 mg at 02/09/17 1445  . lactated ringers infusion   Intravenous Continuous Myrtie Soman, MD 10 mL/hr at 02/10/17 0600    . lip balm (CARMEX) ointment 1 application  1 application Topical BID Michael Boston, MD   1 application at 00/76/22 2147  . loperamide (IMODIUM) capsule 2-4 mg  2-4 mg Oral Q8H PRN Michael Boston, MD      . magic mouthwash  15 mL Oral QID PRN Michael Boston, MD      . menthol-cetylpyridinium (CEPACOL) lozenge 3 mg  1 lozenge Oral PRN Michael Boston, MD      . metoprolol succinate (TOPROL-XL) 24 hr tablet 25 mg  25 mg Oral Q lunch Michael Boston, MD   25 mg at 02/11/17 1329  . metoprolol tartrate (LOPRESSOR) injection 5 mg  5 mg Intravenous Q6H PRN Michael Boston, MD      . multivitamin with minerals tablet 1 tablet   1 tablet Oral Carlis Abbott, MD   1 tablet at 02/10/17 0951  . ondansetron (ZOFRAN) tablet 4 mg  4 mg Oral Q6H PRN Michael Boston, MD       Or  . ondansetron (ZOFRAN) injection 4 mg  4 mg Intravenous Q6H PRN Michael Boston, MD      . phenol (  CHLORASEPTIC) mouth spray 1-2 spray  1-2 spray Mouth/Throat PRN Michael Boston, MD      . prochlorperazine (COMPAZINE) injection 5-10 mg  5-10 mg Intravenous Q4H PRN Michael Boston, MD      . psyllium (HYDROCIL/METAMUCIL) packet 1 packet  1 packet Oral Daily Michael Boston, MD   1 packet at 02/11/17 [phone]  . saccharomyces boulardii (FLORASTOR) capsule 250 mg  250 mg Oral BID Michael Boston, MD   250 mg at 02/11/17 2142  . sodium chloride flush (NS) 0.9 % injection 3 mL  3 mL Intravenous Gorden Harms, MD      . sodium chloride flush (NS) 0.9 % injection 3 mL  3 mL Intravenous PRN Michael Boston, MD      . sodium chloride flush (NS) 0.9 % injection 3 mL  3 mL Intravenous Gorden Harms, MD   3 mL at 02/11/17 0911  . sodium chloride flush (NS) 0.9 % injection 3 mL  3 mL Intravenous PRN Michael Boston, MD      . zolpidem (AMBIEN) tablet 5 mg  5 mg Oral QHS PRN Michael Boston, MD         Allergies  Allergen Reactions  . Amoxicillin     REACTION: TROUBLE BREATHING Has patient had a PCN reaction causing immediate rash, facial/tongue/throat swelling, SOB or lightheadedness with hypotension:Yes Has patient had a PCN reaction causing severe rash involving mucus membranes or skin necrosis:No Has patient had a PCN reaction that required hospitalization:No Has patient had a PCN reaction occurring within the last 10 years:No If all of the above answers are "NO", then may proceed with Cephalosporin use.   Abe People Hfa [Albuterol] Other (See Comments)    Hypersensitivity, anxious, disoriented  . Penicillins Rash    Has patient had a PCN reaction causing immediate rash, facial/tongue/throat swelling, SOB or lightheadedness with hypotension:unknown Has patient  had a PCN reaction causing severe rash involving mucus membranes or skin necrosis:unknown Has patient had a PCN reaction that required hospitalization:no Has patient had a PCN reaction occurring within the last 10 years:no If all of the above answers are "NO", then may proceed with Cephalosporin use.     Signed: Morton Peters, M.D., F.A.C.S. Gastrointestinal and Minimally Invasive Surgery [facility] [address] [place],  [address] [phone] Main / Paging   02/12/2017, 8:17 AM

## 2017-03-31 ENCOUNTER — Other Ambulatory Visit (INDEPENDENT_AMBULATORY_CARE_PROVIDER_SITE_OTHER): Payer: Medicare Other

## 2017-03-31 DIAGNOSIS — I10 Essential (primary) hypertension: Secondary | ICD-10-CM

## 2017-03-31 DIAGNOSIS — E782 Mixed hyperlipidemia: Secondary | ICD-10-CM

## 2017-03-31 DIAGNOSIS — R739 Hyperglycemia, unspecified: Secondary | ICD-10-CM | POA: Diagnosis not present

## 2017-03-31 LAB — COMPREHENSIVE METABOLIC PANEL
ALK PHOS: 83 U/L (ref 39–117)
ALT: 29 U/L (ref 0–35)
AST: 22 U/L (ref 0–37)
Albumin: 4.1 g/dL (ref 3.5–5.2)
BILIRUBIN TOTAL: 1 mg/dL (ref 0.2–1.2)
BUN: 11 mg/dL (ref 6–23)
CALCIUM: 9.1 mg/dL (ref 8.4–10.5)
CO2: 27 mEq/L (ref 19–32)
Chloride: 105 mEq/L (ref 96–112)
Creatinine, Ser: 0.72 mg/dL (ref 0.40–1.20)
GFR: 84.83 mL/min (ref >60.00)
Glucose, Bld: 113 mg/dL — ABNORMAL HIGH (ref 70–99)
POTASSIUM: 4.1 meq/L (ref 3.5–5.1)
Sodium: 138 mEq/L (ref 135–145)
Total Protein: 6.9 g/dL (ref 6.0–8.3)

## 2017-03-31 LAB — LIPID PANEL
CHOL/HDL RATIO: 3
Cholesterol: 223 mg/dL — ABNORMAL HIGH (ref 0–200)
HDL: 76.3 mg/dL (ref >39.00)
LDL Cholesterol: 123 mg/dL — ABNORMAL HIGH (ref 0–99)
NonHDL: 146.44
TRIGLYCERIDES: 115 mg/dL (ref 0.0–149.0)
VLDL: 23 mg/dL (ref 0.0–40.0)

## 2017-03-31 LAB — CBC
HEMATOCRIT: 44.5 % (ref 36.0–46.0)
HEMOGLOBIN: 14.6 g/dL (ref 12.0–15.0)
MCHC: 32.9 g/dL (ref 30.0–36.0)
MCV: 103.9 fl — ABNORMAL HIGH (ref 78.0–100.0)
PLATELETS: 296 10*3/uL (ref 150.0–400.0)
RBC: 4.29 Mil/uL (ref 3.87–5.11)
RDW: 14.5 % (ref 11.5–15.5)
WBC: 6.8 10*3/uL (ref 4.0–10.5)

## 2017-03-31 LAB — HEMOGLOBIN A1C: Hgb A1c MFr Bld: 5.7 % (ref 4.6–6.5)

## 2017-04-06 ENCOUNTER — Other Ambulatory Visit: Payer: Self-pay | Admitting: Family Medicine

## 2017-04-07 ENCOUNTER — Encounter: Payer: Self-pay | Admitting: Family Medicine

## 2017-04-08 ENCOUNTER — Ambulatory Visit: Payer: Self-pay | Admitting: Family Medicine

## 2017-04-18 NOTE — Telephone Encounter (Signed)
Patient returning call, where would Dr Charlett Blake like to schedule/work patient in? Call back [phone]

## 2017-04-21 NOTE — Telephone Encounter (Signed)
Called to follow up with patient. Pt states chest discomfort has improved despite continuing the metoprolol.  However she has noticed increased dizziness, but doesn't know if it is from the medication or her right ear.  She recently had right ear surgery and stated that the right side of her jaw was also sore.  She feels like dizziness and right sided jaw pain could be coming from her ear, but it also could be cardiac related.  She exercises 1 1/2 hours every other day ([place] and walking) and all other days she's walks.  She does experience some chest discomfort with exercise, but it's not as bad.  As far as her dizziness, she always had some dizziness, but has noticed that it has increased.  She also feels more tired.  She just thinks the medication is not agreeing with her.  She does not check her blood pressures at home, but [facility] and send [place] a mychart message of her reading.  Pt thinks she could wait until the 11th.  Pt was advised that we would check with provider for recommendations and call her back.

## 2017-04-22 ENCOUNTER — Encounter: Payer: Self-pay | Admitting: Family Medicine

## 2017-04-22 NOTE — Telephone Encounter (Signed)
Per Dr. Charlett Blake, pt okay to wait until 9/11 to be seen.  She also advised that patient decrease Metoprolol to 1/2 table by mouth daily until seen by provider.    Called patient to advise.  Pt stated understanding and agreed with plan.  She stated that her blood pressure today was 121/71.  No additional questions or concerns voiced.  She will keep appt on 9/11 as scheduled and will continue to record her blood pressures until seen.

## 2017-05-03 ENCOUNTER — Ambulatory Visit (INDEPENDENT_AMBULATORY_CARE_PROVIDER_SITE_OTHER): Payer: Medicare Other | Admitting: Family Medicine

## 2017-05-03 ENCOUNTER — Encounter: Payer: Self-pay | Admitting: Family Medicine

## 2017-05-03 VITALS — BP 138/82 | HR 69 | Temp 98.4°F | Resp 18

## 2017-05-03 DIAGNOSIS — E782 Mixed hyperlipidemia: Secondary | ICD-10-CM

## 2017-05-03 DIAGNOSIS — E6609 Other obesity due to excess calories: Secondary | ICD-10-CM

## 2017-05-03 DIAGNOSIS — I10 Essential (primary) hypertension: Secondary | ICD-10-CM | POA: Diagnosis not present

## 2017-05-03 DIAGNOSIS — R739 Hyperglycemia, unspecified: Secondary | ICD-10-CM | POA: Diagnosis not present

## 2017-05-03 DIAGNOSIS — R079 Chest pain, unspecified: Secondary | ICD-10-CM | POA: Diagnosis not present

## 2017-05-03 NOTE — Patient Instructions (Signed)
Benefiber powder twice daily and then drop as needed Probiotics daily Hypertension Hypertension, commonly called high blood pressure, is when the force of blood pumping through the arteries is too strong. The arteries are the blood vessels that carry blood from the heart throughout the body. Hypertension forces the heart to work harder to pump blood and may cause arteries to become narrow or stiff. Having untreated or uncontrolled hypertension can cause heart attacks, strokes, kidney disease, and other problems. A blood pressure reading consists of a higher number over a lower number. Ideally, your blood pressure should be below 120/80. The first ("top") number is called the systolic pressure. It is a measure of the pressure in your arteries as your heart beats. The second ("bottom") number is called the diastolic pressure. It is a measure of the pressure in your arteries as the heart relaxes. What are the causes? The cause of this condition is not known. What increases the risk? Some risk factors for high blood pressure are under your control. Others are not. Factors you can change  Smoking.  Having type 2 diabetes mellitus, high cholesterol, or both.  Not getting enough exercise or physical activity.  Being overweight.  Having too much fat, sugar, calories, or salt (sodium) in your diet.  Drinking too much alcohol. Factors that are difficult or impossible to change  Having chronic kidney disease.  Having a family history of high blood pressure.  Age. Risk increases with age.  Race. You may be at higher risk if you are African-American.  Gender. Men are at higher risk than women before age 24. After age 69, women are at higher risk than men.  Having obstructive sleep apnea.  Stress. What are the signs or symptoms? Extremely high blood pressure (hypertensive crisis) may cause:  Headache.  Anxiety.  Shortness of breath.  Nosebleed.  Nausea and vomiting.  Severe chest  pain.  Jerky movements you cannot control (seizures).  How is this diagnosed? This condition is diagnosed by measuring your blood pressure while you are seated, with your arm resting on a surface. The cuff of the blood pressure monitor will be placed directly against the skin of your upper arm at the level of your heart. It should be measured at least twice using the same arm. Certain conditions can cause a difference in blood pressure between your right and left arms. Certain factors can cause blood pressure readings to be lower or higher than normal (elevated) for a short period of time:  When your blood pressure is higher when you are in a health care provider's office than when you are at home, this is called white coat hypertension. Most people with this condition do not need medicines.  When your blood pressure is higher at home than when you are in a health care provider's office, this is called masked hypertension. Most people with this condition may need medicines to control blood pressure.  If you have a high blood pressure reading during one visit or you have normal blood pressure with other risk factors:  You may be asked to return on a different day to have your blood pressure checked again.  You may be asked to monitor your blood pressure at home for 1 week or longer.  If you are diagnosed with hypertension, you may have other blood or imaging tests to help your health care provider understand your overall risk for other conditions. How is this treated? This condition is treated by making healthy lifestyle changes, such as  eating healthy foods, exercising more, and reducing your alcohol intake. Your health care provider may prescribe medicine if lifestyle changes are not enough to get your blood pressure under control, and if:  Your systolic blood pressure is above 130.  Your diastolic blood pressure is above 80.  Your personal target blood pressure may vary depending on your  medical conditions, your age, and other factors. Follow these instructions at home: Eating and drinking  Eat a diet that is high in fiber and potassium, and low in sodium, added sugar, and fat. An example eating plan is called the DASH (Dietary Approaches to Stop Hypertension) diet. To eat this way: ? Eat plenty of fresh fruits and vegetables. Try to fill half of your plate at each meal with fruits and vegetables. ? Eat whole grains, such as whole wheat pasta, brown rice, or whole grain bread. Fill about one quarter of your plate with whole grains. ? Eat or drink low-fat dairy products, such as skim milk or low-fat yogurt. ? Avoid fatty cuts of meat, processed or cured meats, and poultry with skin. Fill about one quarter of your plate with lean proteins, such as fish, chicken without skin, beans, eggs, and tofu. ? Avoid premade and processed foods. These tend to be higher in sodium, added sugar, and fat.  Reduce your daily sodium intake. Most people with hypertension should eat less than 1,500 mg of sodium a day.  Limit alcohol intake to no more than 1 drink a day for nonpregnant women and 2 drinks a day for men. One drink equals 12 oz of beer, 5 oz of wine, or 1 oz of hard liquor. Lifestyle  Work with your health care provider to maintain a healthy body weight or to lose weight. Ask what an ideal weight is for you.  Get at least 30 minutes of exercise that causes your heart to beat faster (aerobic exercise) most days of the week. Activities may include walking, swimming, or biking.  Include exercise to strengthen your muscles (resistance exercise), such as pilates or lifting weights, as part of your weekly exercise routine. Try to do these types of exercises for 30 minutes at least 3 days a week.  Do not use any products that contain nicotine or tobacco, such as cigarettes and e-cigarettes. If you need help quitting, ask your health care provider.  Monitor your blood pressure at home as  told by your health care provider.  Keep all follow-up visits as told by your health care provider. This is important. Medicines  Take over-the-counter and prescription medicines only as told by your health care provider. Follow directions carefully. Blood pressure medicines must be taken as prescribed.  Do not skip doses of blood pressure medicine. Doing this puts you at risk for problems and can make the medicine less effective.  Ask your health care provider about side effects or reactions to medicines that you should watch for. Contact a health care provider if:  You think you are having a reaction to a medicine you are taking.  You have headaches that keep coming back (recurring).  You feel dizzy.  You have swelling in your ankles.  You have trouble with your vision. Get help right away if:  You develop a severe headache or confusion.  You have unusual weakness or numbness.  You feel faint.  You have severe pain in your chest or abdomen.  You vomit repeatedly.  You have trouble breathing. Summary  Hypertension is when the force of blood pumping  through your arteries is too strong. If this condition is not controlled, it may put you at risk for serious complications.  Your personal target blood pressure may vary depending on your medical conditions, your age, and other factors. For most people, a normal blood pressure is less than 120/80.  Hypertension is treated with lifestyle changes, medicines, or a combination of both. Lifestyle changes include weight loss, eating a healthy, low-sodium diet, exercising more, and limiting alcohol. This information is not intended to replace advice given to you by your health care provider. Make sure you discuss any questions you have with your health care provider. Document Released: 08/09/2005 Document Revised: 07/07/2016 Document Reviewed: 07/07/2016 Elsevier Interactive Patient Education  [employer].

## 2017-05-03 NOTE — Progress Notes (Signed)
Subjective:  I acted as a Education administrator for Dr. Charlett Blake. [name], [place]  Patient ID: Renee [name], female    DOB: 01/19/1946, 71 y.o.   MRN: [id number]  No chief complaint on file.   HPI  Patient is in today for a follow up. Patient is following up on her HTN, hyperlipidemia and other medical concerns. Patient states she has no acute concerns. No recent febrile illness or acute hospitalizations. Denies palp/SOB/HA/congestion/fevers/GI or GU c/o. Taking meds as prescribed. She does endorse some occasional chest discomfort. No diaphoresis, nausea, palpitations or SOB with the chest discomfort.    Patient Care Team: [name], MD as PCP - General (Family Medicine) Melissa Montane, MD as Consulting Physician (Otolaryngology) Gatha Mayer, MD as Consulting Physician (Gastroenterology) Dr Shanon Brow Daughtry (Optometry) Michael Boston, MD as Consulting Physician (General Surgery) Love, Alyson Locket, MD as Consulting Physician (Neurology) Bernell List, CPhT as [place] [facility] (Pharmacy Technician)   Past Medical History:  Diagnosis Date  . ABNORMAL INVOLUNTARY MOVEMENTS 01/01/2009   Qualifier: Diagnosis of  By: Linna Darner MD, Gwyndolyn Saxon    . Allergic rhinitis   . Allergic rhinitis 09/19/2008   Qualifier: Diagnosis of  By: Wynona Luna   . Cervical cancer screening 05/08/2013   Menarche 11, regular Menopause 57s G2P2 s/p 2 svd No abnormal paps or MGM Fatty cyst on labia removed.   . Cervical pain (neck) 10/07/2015  . CHEST PAIN 09/19/2008   Qualifier: Diagnosis of  By: Wynona Luna   . 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 Concussion 1969  . Diverticulosis 05/06/2017  . Essential hypertension 09/19/2008   Qualifier: Diagnosis of  By: Wynona Luna   . GERD (gastroesophageal reflux disease)   . H/O osteopenia 02/08/2016  . H/O tobacco use, presenting hazards to health 09/19/2008   Qualifier: Diagnosis of  By: Wynona Luna Smoking roughly 5 cig daily  Last cigarette was in December of 2016  . Head tremor 12/27/2016  . 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 Hyperglycemia 10/07/2016  . Hyperlipidemia   . Hyperlipidemia, mixed 10/03/2008   Qualifier: Diagnosis of  By: Wynona Luna   . Hypertension   . 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 Obesity 07/06/2016  . Occult blood positive stool   . Polyp of sigmoid colon 12/08/2016   11/2016 - Unable to remove or get bx at colonoscopy. Fixed sigmoid colon prevents. Referred for surgery.  Marland Kitchen PONV (postoperative nausea and vomiting)    ,   . Preventative health care 05/12/2013  . 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 Tobacco abuse   . Tremor, essential   . TREMOR, ESSENTIAL 09/19/2008   Qualifier: Diagnosis of  By: Wynona Luna     Past Surgical History:  Procedure Laterality Date  . INNER EAR SURGERY  2015   with tube  . MOUTH SURGERY  01/22/11   lower denture secured by titanium bolts  . rectal suction biospy    . XI ROBOTIC ASSISTED LOWER ANTERIOR RESECTION N/A 02/09/2017   Procedure: XI ROBOTIC RESECTION OF SIGMOID COLON, RIGID PROCTOSCOPY;  Surgeon: Michael Boston, MD;  Location: [facility];  Service: General;  Laterality: N/A;    Family History  Problem Relation  Age of Onset  . Heart attack Father 4       deceased  . Stroke Mother   . Hypertension Mother   . Alcohol abuse Mother   . Obesity Daughter   . Heart disease Paternal Grandmother        AAA rupture  . Heart disease Paternal Grandfather        MI  . Colon cancer Neg Hx   . Breast cancer Neg Hx   . Prostate cancer Neg Hx   . Diabetes Neg Hx     Social History   Social History  . Marital status: Married    Spouse name: N/A  . Number of children: N/A  . Years of education: N/A   Occupational History  . Not on file.   Social History Main Topics  . Smoking  status: Former Smoker    Packs/day: 0.50    Types: Cigarettes    Quit date: 09/07/2015  . Smokeless tobacco: Never Used  . Alcohol use 8.4 oz/week    14 Glasses of wine per week     Comment: 3 glasses of wine daily   . Drug use: No  . Sexual activity: Not Currently   Other Topics Concern  . Not on file   Social History Narrative  . No narrative on file    Outpatient Medications Prior to Visit  Medication Sig Dispense Refill  . atorvastatin (LIPITOR) 10 MG tablet TAKE 1 TABLET BY MOUTH EVERY TUESDAY AND SATURDAY 10 tablet 3  . b complex vitamins tablet Take 1 tablet by mouth every other day.    . Cholecalciferol (VITAMIN D PO) Take 1 tablet by mouth every other day. 3 times per week    . Coenzyme Q10 200 MG capsule Take 200 mg by mouth every other day. 3 times per week    . metoprolol succinate (TOPROL-XL) 25 MG 24 hr tablet TAKE 1 TABLET BY MOUTH EVERY DAY 30 tablet 2  . Multiple Vitamin (MULTIVITAMIN WITH MINERALS) TABS tablet Take 1 tablet by mouth every other day.    . Probiotic Product (PROBIOTIC-10 PO) Take 1 capsule by mouth. Every Tuesday, Thursday, and Saturday in the am    . ranitidine (ZANTAC) 300 MG capsule Take 1 capsule (300 mg total) by mouth every evening. (Patient taking differently: Take 150 mg by mouth daily as needed (for heartburn/indigestion.). Only as needed) 30 capsule 3  . traMADol (ULTRAM) 50 MG tablet Take 1-2 tablets (50-100 mg total) by mouth every 6 (six) hours as needed for moderate pain or severe pain. 30 tablet 0   No facility-administered medications prior to visit.     Allergies  Allergen Reactions  . Amoxicillin     REACTION: TROUBLE BREATHING Has patient had a PCN reaction causing immediate rash, facial/tongue/throat swelling, SOB or lightheadedness with hypotension:Yes Has patient had a PCN reaction causing severe rash involving mucus membranes or skin necrosis:No Has patient had a PCN reaction that required hospitalization:No Has patient  had a PCN reaction occurring within the last 10 years:No If all of the above answers are "NO", then may proceed with Cephalosporin use.   Abe People Hfa [Albuterol] Other (See Comments)    Hypersensitivity, anxious, disoriented  . Penicillins Rash    Has patient had a PCN reaction causing immediate rash, facial/tongue/throat swelling, SOB or lightheadedness with hypotension:unknown Has patient had a PCN reaction causing severe rash involving mucus membranes or skin necrosis:unknown Has patient had a PCN reaction that required hospitalization:no Has patient had  a PCN reaction occurring within the last 10 years:no If all of the above answers are "NO", then may proceed with Cephalosporin use.     Review of Systems  Constitutional: Negative for fever and malaise/fatigue.  HENT: Negative for congestion.   Eyes: Negative for blurred vision.  Respiratory: Negative for cough and shortness of breath.   Cardiovascular: Negative for chest pain, palpitations and leg swelling.  Gastrointestinal: Negative for vomiting.  Musculoskeletal: Negative for back pain.  Skin: Negative for rash.  Neurological: Negative for loss of consciousness and headaches.       Objective:    Physical Exam  Constitutional: She is oriented to person, place, and time. She appears well-developed and well-nourished. No distress.  HENT:  Head: Normocephalic and atraumatic.  Eyes: Conjunctivae are normal.  Neck: Normal range of motion. No thyromegaly present.  Cardiovascular: Normal rate and regular rhythm.   Pulmonary/Chest: Effort normal and breath sounds normal. She has no wheezes.  Abdominal: Soft. Bowel sounds are normal. There is no tenderness.  Musculoskeletal: Normal range of motion. She exhibits no edema or deformity.  Neurological: She is alert and oriented to person, place, and time.  Skin: Skin is warm and dry. She is not diaphoretic.  Psychiatric: She has a normal mood and affect.    BP 138/82 (BP  Location: Left Arm, Patient Position: Sitting, Cuff Size: Normal)   Pulse 69   Temp 98.4 F (36.9 C) (Oral)   Resp 18   SpO2 96%  Wt Readings from Last 3 Encounters:  02/12/17 195 lb 12.3 oz (88.8 kg)  02/02/17 181 lb (82.1 kg)  01/03/17 191 lb (86.6 kg)   BP Readings from Last 3 Encounters:  05/03/17 138/82  02/12/17 (!) 154/88  02/02/17 (!) 155/92     Immunization History  Administered Date(s) Administered  . Pneumococcal Conjugate-13 07/06/2016    Health Maintenance  Topic Date Due  . TETANUS/TDAP  01/30/1965  . Hepatitis C Screening  08/15/2017 (Originally 1945/12/11)  . INFLUENZA VACCINE  05/11/2018 (Originally 03/23/2017)  . PNA vac Low Risk Adult (2 of 2 - PPSV23) 07/06/2017  . COLONOSCOPY  12/01/2017  . MAMMOGRAM  09/02/2018  . DEXA SCAN  Completed    Lab Results  Component Value Date   WBC 6.6 05/03/2017   HGB 14.1 05/03/2017   HCT 43.3 05/03/2017   PLT 253.0 05/03/2017   GLUCOSE 86 05/03/2017   CHOL 223 (H) 03/31/2017   TRIG 115.0 03/31/2017   HDL 76.30 03/31/2017   LDLDIRECT 148.6 09/26/2008   LDLCALC 123 (H) 03/31/2017   ALT 21 05/03/2017   AST 19 05/03/2017   NA 138 05/03/2017   K 4.4 05/03/2017   CL 104 05/03/2017   CREATININE 0.72 05/03/2017   BUN 17 05/03/2017   CO2 25 05/03/2017   TSH 1.07 12/28/2016   HGBA1C 5.7 03/31/2017    Lab Results  Component Value Date   TSH 1.07 12/28/2016   Lab Results  Component Value Date   WBC 6.6 05/03/2017   HGB 14.1 05/03/2017   HCT 43.3 05/03/2017   MCV 104.0 (H) 05/03/2017   PLT 253.0 05/03/2017   Lab Results  Component Value Date   NA 138 05/03/2017   K 4.4 05/03/2017   CO2 25 05/03/2017   GLUCOSE 86 05/03/2017   BUN 17 05/03/2017   CREATININE 0.72 05/03/2017   BILITOT 0.5 05/03/2017   ALKPHOS 82 05/03/2017   AST 19 05/03/2017   ALT 21 05/03/2017   PROT 6.8 05/03/2017  ALBUMIN 4.2 05/03/2017   CALCIUM 9.2 05/03/2017   ANIONGAP 6 02/10/2017   GFR 84.81 05/03/2017   Lab Results    Component Value Date   CHOL 223 (H) 03/31/2017   Lab Results  Component Value Date   HDL 76.30 03/31/2017   Lab Results  Component Value Date   LDLCALC 123 (H) 03/31/2017   Lab Results  Component Value Date   TRIG 115.0 03/31/2017   Lab Results  Component Value Date   CHOLHDL 3 03/31/2017   Lab Results  Component Value Date   HGBA1C 5.7 03/31/2017         Assessment & Plan:   Problem List Items Addressed This Visit    Hyperlipidemia, mixed    Encouraged heart healthy diet, increase exercise, avoid trans fats, consider a krill oil cap daily, Atorvastatin 10 mg daily      Relevant Medications   aspirin EC 81 MG tablet   Essential hypertension    Well controlled, no changes to meds. Encouraged heart healthy diet such as the DASH diet and exercise as tolerated.       Relevant Medications   aspirin EC 81 MG tablet   CHEST PAIN - Primary    None in office but has had some trouble with chest pain intermittently.       Relevant Orders   CK (Creatine Kinase) (Completed)   Comprehensive metabolic panel (Completed)   CBC w/Diff (Completed)   Magnesium (Completed)   Ambulatory referral to Cardiology   Obesity    Encouraged DASH diet, decrease po intake and increase exercise as tolerated. Needs 7-8 hours of sleep nightly. Avoid trans fats, eat small, frequent meals every 4-5 hours with lean proteins, complex carbs and healthy fats. Minimize simple carbs      Hyperglycemia    hgba1c acceptable, minimize simple carbs. Increase exercise as tolerated.          I am having Ms. Few start on aspirin EC. I am also having her maintain her b complex vitamins, ranitidine, Cholecalciferol (VITAMIN D PO), Probiotic Product (PROBIOTIC-10 PO), multivitamin with minerals, Coenzyme Q10, atorvastatin, traMADol, and metoprolol succinate.  Meds ordered this encounter  Medications  . aspirin EC 81 MG tablet    Sig: Take 1 tablet (81 mg total) by mouth daily.    CMA served as  Education administrator during this visit. History, Physical and Plan performed by medical provider. Documentation and orders reviewed and attested to.  Penni Homans, MD

## 2017-05-04 LAB — COMPREHENSIVE METABOLIC PANEL
ALT: 21 U/L (ref 0–35)
AST: 19 U/L (ref 0–37)
Albumin: 4.2 g/dL (ref 3.5–5.2)
Alkaline Phosphatase: 82 U/L (ref 39–117)
BILIRUBIN TOTAL: 0.5 mg/dL (ref 0.2–1.2)
BUN: 17 mg/dL (ref 6–23)
CO2: 25 meq/L (ref 19–32)
Calcium: 9.2 mg/dL (ref 8.4–10.5)
Chloride: 104 mEq/L (ref 96–112)
Creatinine, Ser: 0.72 mg/dL (ref 0.40–1.20)
GFR: 84.81 mL/min (ref >60.00)
GLUCOSE: 86 mg/dL (ref 70–99)
Potassium: 4.4 mEq/L (ref 3.5–5.1)
Sodium: 138 mEq/L (ref 135–145)
Total Protein: 6.8 g/dL (ref 6.0–8.3)

## 2017-05-04 LAB — CBC WITH DIFFERENTIAL/PLATELET
BASOS PCT: 1.1 % (ref 0.0–3.0)
Basophils Absolute: 0.1 10*3/uL (ref 0.0–0.1)
EOS PCT: 1.5 % (ref 0.0–5.0)
Eosinophils Absolute: 0.1 10*3/uL (ref 0.0–0.7)
HCT: 43.3 % (ref 36.0–46.0)
Hemoglobin: 14.1 g/dL (ref 12.0–15.0)
LYMPHS ABS: 2.4 10*3/uL (ref 0.7–4.0)
Lymphocytes Relative: 36.2 % (ref 12.0–46.0)
MCHC: 32.7 g/dL (ref 30.0–36.0)
MCV: 104 fl — AB (ref 78.0–100.0)
MONOS PCT: 10.1 % (ref 3.0–12.0)
Monocytes Absolute: 0.7 10*3/uL (ref 0.1–1.0)
NEUTROS ABS: 3.4 10*3/uL (ref 1.4–7.7)
NEUTROS PCT: 51.1 % (ref 43.0–77.0)
PLATELETS: 253 10*3/uL (ref 150.0–400.0)
RBC: 4.16 Mil/uL (ref 3.87–5.11)
RDW: 14.8 % (ref 11.5–15.5)
WBC: 6.6 10*3/uL (ref 4.0–10.5)

## 2017-05-04 LAB — MAGNESIUM: Magnesium: 2.2 mg/dL (ref 1.5–2.5)

## 2017-05-04 LAB — CK: CK TOTAL: 40 U/L (ref 7–177)

## 2017-05-06 DIAGNOSIS — K5790 Diverticulosis of intestine, part unspecified, without perforation or abscess without bleeding: Secondary | ICD-10-CM | POA: Insufficient documentation

## 2017-05-06 HISTORY — DX: Diverticulosis of intestine, part unspecified, without perforation or abscess without bleeding: K57.90

## 2017-05-06 NOTE — Progress Notes (Signed)
Cardiology Office Note:    Date:  05/10/2017   ID:  Renee [name], DOB November 02, 1945, MRN [id number]  PCP:  [name], MD  Cardiologist:  Shirlee More, MD   Referring MD: [name], MD  ASSESSMENT:    1. Chest pain, unspecified type   2. Essential hypertension   3. PVC's (premature ventricular contractions)    PLAN:    In order of problems listed above:  1. Her symptoms are atypical nonanginal but in a high risk individual I advised a stress test and she accepts provided she does not receive nuclear isotope.Stress echo ordered the next few weeks this office. If abnormal she require further evaluation including coronary angiography. 2. I'm unsure if she is well controlled I asked her to buy an electronic device check home blood pressures and bring them to her next visit. If additional treatment is needed ARB with or without diuretic would be appropriate 3. Asymptomatic unless her stress disorder abnormal I would not pursue an arrhythmia evaluation at this time. 4. Hyperlipidemia stable I encouraged her to continue her current statin  Next appointment  weeks   Medication Adjustments/Labs and Tests Ordered: Current medicines are reviewed at length with the patient today.  Concerns regarding medicines are outlined above.  Orders Placed This Encounter  Procedures  . EKG 12-Lead  . ECHOCARDIOGRAM STRESS TEST   No orders of the defined types were placed in this encounter.    Chief Complaint  Patient presents with  . New Patient (Initial Visit)    per Dr Randel Pigg to evaluate CP  . Chest Pain  . Palpitations  . Dizziness    History of Present Illness:    Renee [name] is a 71 y.o. female with hypertension and hyperlipidemiawho is being seen today for the evaluation of chest pain at the request of [name], MD.she relates a long history of epigastric discomfort but is improved with treatment. In the last month she's been having a vague precordial discomfort that  she describes as pressure is not severe it's not exertional in nature and lasts for a few minutes at a time.there is no pleuritic component and no radiation to the shoulder or neck or back. She has stopped smoking gained weight and is now exercising walking a half mile to mile per day and has no exertional symptoms other than mild shortness of breath when she climbs several flights of stairs. She has a history remote trauma to the chest but no knowledge of rib fractures does not do heavy lifting. No associated GI symptoms. She has no background history of congenital or rheumatic heart disease. She feels her symptoms were worsened after she started a beta blocker her blood pressure is elevated today but she seems hesitant to accept the diagnosis of high blood pressure and says that her blood pressure outside of the office is within normal limits. She does not check home blood pressure regularly. She was recently started on a low-dose of a statin and is tolerating it.she relates to nuclear stress test done 15 and 8 years ago were both normal.   Past Medical History:  Diagnosis Date  . ABNORMAL INVOLUNTARY MOVEMENTS 01/01/2009   Qualifier: Diagnosis of  By: Linna Darner MD, Gwyndolyn Saxon    . Allergic rhinitis   . Allergic rhinitis 09/19/2008   Qualifier: Diagnosis of  By: Wynona Luna   . Cervical cancer screening 05/08/2013   Menarche 11, regular Menopause 42s G2P2 s/p 2 svd No abnormal paps  or MGM Fatty cyst on labia removed.   . Cervical pain (neck) 10/07/2015  . CHEST PAIN 09/19/2008   Qualifier: Diagnosis of  By: Wynona Luna   . 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 Concussion 1969  . Diverticulosis 05/06/2017  . Essential hypertension 09/19/2008   Qualifier: Diagnosis of  By: Wynona Luna   . GERD (gastroesophageal reflux disease)   . H/O osteopenia 02/08/2016  . H/O tobacco use, presenting hazards to health 09/19/2008   Qualifier: Diagnosis  of  By: Wynona Luna Smoking roughly 5 cig daily  Last cigarette was in December of 2016  . Head tremor 12/27/2016  . 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 Hyperglycemia 10/07/2016  . Hyperlipidemia   . Hyperlipidemia, mixed 10/03/2008   Qualifier: Diagnosis of  By: Wynona Luna   . Hypertension   . 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 Obesity 07/06/2016  . Occult blood positive stool   . Polyp of sigmoid colon 12/08/2016   11/2016 - Unable to remove or get bx at colonoscopy. Fixed sigmoid colon prevents. Referred for surgery.  Marland Kitchen PONV (postoperative nausea and vomiting)    ,   . Preventative health care 05/12/2013  . 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 Tobacco abuse   . Tremor, essential   . TREMOR, ESSENTIAL 09/19/2008   Qualifier: Diagnosis of  By: Wynona Luna     Past Surgical History:  Procedure Laterality Date  . INNER EAR SURGERY  2015   with tube  . MOUTH SURGERY  01/22/11   lower denture secured by titanium bolts  . rectal suction biospy    . XI ROBOTIC ASSISTED LOWER ANTERIOR RESECTION N/A 02/09/2017   Procedure: XI ROBOTIC RESECTION OF SIGMOID COLON, RIGID PROCTOSCOPY;  Surgeon: Michael Boston, MD;  Location: [facility];  Service: General;  Laterality: N/A;    Current Medications: Current Meds  Medication Sig  . atorvastatin (LIPITOR) 10 MG tablet TAKE 1 TABLET BY MOUTH EVERY TUESDAY AND SATURDAY (Patient taking differently: TAKE 0.5 TABLET BY MOUTH EVERY TUESDAY AND SATURDAY)  . b complex vitamins tablet Take 1 tablet by mouth every other day.  . Cholecalciferol (VITAMIN D PO) Take 1 tablet by mouth every other day. 3 times per week  . Coenzyme Q10 200 MG capsule Take 200 mg by mouth every other day. 3 times per week  . metoprolol succinate (TOPROL-XL) 25 MG 24 hr tablet TAKE 1 TABLET BY MOUTH EVERY DAY (Patient taking differently: TAKE 0.5 TABLET BY  MOUTH EVERY DAY)  . Multiple Vitamin (MULTIVITAMIN WITH MINERALS) TABS tablet Take 1 tablet by mouth every other day.  . Probiotic Product (PROBIOTIC-10 PO) Take 1 capsule by mouth. Every Tuesday, Thursday, and Saturday in the am  . ranitidine (ZANTAC) 300 MG capsule Take 1 capsule (300 mg total) by mouth every evening. (Patient taking differently: Take 150 mg by mouth daily as needed (for heartburn/indigestion.). Only as needed)     Allergies:   Amoxicillin; Proair hfa [albuterol]; and Penicillins   Social History   Social History  . Marital status: Married    Spouse name: N/A  . Number of children: N/A  . Years of education: N/A   Social History Main Topics  . Smoking status:  Former Smoker    Packs/day: 0.50    Types: Cigarettes    Quit date: 09/07/2015  . Smokeless tobacco: Never Used  . Alcohol use 8.4 oz/week    14 Glasses of wine per week     Comment: 3 glasses of wine daily   . Drug use: No  . Sexual activity: Not Currently   Other Topics Concern  . None   Social History Narrative  . None     Family History: The patient's family history includes Alcohol abuse in her mother; Heart attack (age of onset: 77) in her father; Heart disease in her paternal grandfather and paternal grandmother; Hypertension in her mother; Obesity in her daughter; Stroke in her mother. There is no history of Colon cancer, Breast cancer, Prostate cancer, or Diabetes.  ROS:   ROS Please see the history of present illness.    She has a tremor and uses alcohol to control he symptoms All other systems reviewed and are negative.  EKGs/Labs/Other Studies Reviewed:    The following studies were reviewed today:  EKG: 01/1317: [place]. Minor nonspecific ST depression EKG:  EKG is  ordered today.  The ekg ordered today demonstrates [place], minor St anormality unchanged, PVC's  Recent Labs: 12/28/2016: TSH 1.07 05/03/2017: ALT 21; BUN 17; Creatinine, Ser 0.72; Hemoglobin 14.1; Magnesium 2.2; Platelets  253.0; Potassium 4.4; Sodium 138  Recent Lipid Panel    Component Value Date/Time   CHOL 223 (H) 03/31/2017 0729   TRIG 115.0 03/31/2017 0729   HDL 76.30 03/31/2017 0729   CHOLHDL 3 03/31/2017 0729   VLDL 23.0 03/31/2017 0729   LDLCALC 123 (H) 03/31/2017 0729   LDLDIRECT 148.6 09/26/2008 0811    Physical Exam:    VS:  BP (!) 162/94 (BP Location: Left Arm, Patient Position: Sitting)   Pulse 74   Ht 5' 6.5" (1.689 m)   Wt 189 lb 6.4 oz (85.9 kg)   SpO2 97%   BMI 30.11 kg/m     Wt Readings from Last 3 Encounters:  05/10/17 189 lb 6.4 oz (85.9 kg)  02/12/17 195 lb 12.3 oz (88.8 kg)  02/02/17 181 lb (82.1 kg)     GEN: she has a resting tremor Well nourished, well developed in no acute distress HEENT: Normal NECK: No JVD; No carotid bruits LYMPHATICS: No lymphadenopathy CARDIAC: RRR, no murmurs, rubs, gallops RESPIRATORY:  Clear to auscultation without rales, wheezing or rhonchi  ABDOMEN: Soft, non-tender, non-distended MUSCULOSKELETAL:  No edema; No deformity  SKIN: Warm and dry NEUROLOGIC:  Alert and oriented x 3 PSYCHIATRIC:  Normal affect     Signed, Shirlee More, MD  05/10/2017 9:27 AM    [place]

## 2017-05-08 MED ORDER — ASPIRIN EC 81 MG PO TBEC: 81.0000 mg | DELAYED_RELEASE_TABLET | Freq: Every day | ORAL | Status: DC

## 2017-05-08 NOTE — Assessment & Plan Note (Signed)
Well controlled, no changes to meds. Encouraged heart healthy diet such as the DASH diet and exercise as tolerated.  °

## 2017-05-08 NOTE — Assessment & Plan Note (Addendum)
Encouraged heart healthy diet, increase exercise, avoid trans fats, consider a krill oil cap daily, Atorvastatin 10 mg daily

## 2017-05-08 NOTE — Assessment & Plan Note (Signed)
None in office but has had some trouble with chest pain intermittently.

## 2017-05-08 NOTE — Assessment & Plan Note (Signed)
Encouraged DASH diet, decrease po intake and increase exercise as tolerated. Needs 7-8 hours of sleep nightly. Avoid trans fats, eat small, frequent meals every 4-5 hours with lean proteins, complex carbs and healthy fats. Minimize simple carbs 

## 2017-05-08 NOTE — Assessment & Plan Note (Signed)
hgba1c acceptable, minimize simple carbs. Increase exercise as tolerated.  

## 2017-05-10 ENCOUNTER — Encounter: Payer: Self-pay | Admitting: Cardiology

## 2017-05-10 ENCOUNTER — Ambulatory Visit (INDEPENDENT_AMBULATORY_CARE_PROVIDER_SITE_OTHER): Payer: Medicare Other | Admitting: Cardiology

## 2017-05-10 VITALS — BP 162/94 | HR 74 | Ht 66.5 in | Wt 189.4 lb

## 2017-05-10 DIAGNOSIS — I493 Ventricular premature depolarization: Secondary | ICD-10-CM | POA: Insufficient documentation

## 2017-05-10 DIAGNOSIS — I10 Essential (primary) hypertension: Secondary | ICD-10-CM

## 2017-05-10 DIAGNOSIS — R079 Chest pain, unspecified: Secondary | ICD-10-CM

## 2017-05-10 HISTORY — DX: Ventricular premature depolarization: I49.3

## 2017-05-10 NOTE — Patient Instructions (Addendum)
Medication Instructions:  Your physician recommends that you continue on your current medications as directed. Please refer to the Current Medication list given to you today.  Labwork: None  Testing/Procedures: You had an EKG today.  Your physician has requested that you have a stress echocardiogram. For further information please visit [email or web address]. Please follow instruction sheet as given.  Follow-Up: Your physician recommends that you schedule a follow-up appointment in: 4 weeks.   Any Other Special Instructions Will Be Listed Below (If Applicable).     If you need a refill on your cardiac medications before your next appointment, please call your pharmacy.    You can use My Fitness Pal to help lose weight  Monitor your BP at home with an arm electronic device

## 2017-05-13 ENCOUNTER — Other Ambulatory Visit (HOSPITAL_BASED_OUTPATIENT_CLINIC_OR_DEPARTMENT_OTHER): Payer: Self-pay

## 2017-05-17 ENCOUNTER — Other Ambulatory Visit: Payer: Self-pay

## 2017-05-17 ENCOUNTER — Ambulatory Visit (HOSPITAL_BASED_OUTPATIENT_CLINIC_OR_DEPARTMENT_OTHER)
Admission: RE | Admit: 2017-05-17 | Discharge: 2017-05-17 | Disposition: A | Discharge status: Home / Self Care | Payer: Medicare Other | Source: Ambulatory Visit | Attending: Cardiology | Admitting: Cardiology

## 2017-05-17 DIAGNOSIS — I10 Essential (primary) hypertension: Secondary | ICD-10-CM | POA: Insufficient documentation

## 2017-05-17 DIAGNOSIS — R079 Chest pain, unspecified: Secondary | ICD-10-CM | POA: Diagnosis not present

## 2017-07-05 ENCOUNTER — Encounter: Payer: Self-pay | Admitting: Family Medicine

## 2017-07-05 ENCOUNTER — Encounter: Payer: Self-pay | Admitting: Cardiology

## 2017-07-05 ENCOUNTER — Ambulatory Visit (INDEPENDENT_AMBULATORY_CARE_PROVIDER_SITE_OTHER): Payer: Medicare Other | Admitting: Family Medicine

## 2017-07-05 ENCOUNTER — Ambulatory Visit: Payer: Medicare Other | Admitting: Cardiology

## 2017-07-05 VITALS — BP 158/92 | HR 86 | Ht 66.5 in | Wt 193.0 lb

## 2017-07-05 DIAGNOSIS — R739 Hyperglycemia, unspecified: Secondary | ICD-10-CM | POA: Diagnosis not present

## 2017-07-05 DIAGNOSIS — E782 Mixed hyperlipidemia: Secondary | ICD-10-CM | POA: Diagnosis not present

## 2017-07-05 DIAGNOSIS — I10 Essential (primary) hypertension: Secondary | ICD-10-CM

## 2017-07-05 DIAGNOSIS — R1013 Epigastric pain: Secondary | ICD-10-CM | POA: Diagnosis not present

## 2017-07-05 DIAGNOSIS — R079 Chest pain, unspecified: Secondary | ICD-10-CM

## 2017-07-05 DIAGNOSIS — E6609 Other obesity due to excess calories: Secondary | ICD-10-CM | POA: Diagnosis not present

## 2017-07-05 DIAGNOSIS — R109 Unspecified abdominal pain: Secondary | ICD-10-CM

## 2017-07-05 DIAGNOSIS — I493 Ventricular premature depolarization: Secondary | ICD-10-CM | POA: Diagnosis not present

## 2017-07-05 HISTORY — DX: Unspecified abdominal pain: R10.9

## 2017-07-05 LAB — COMPREHENSIVE METABOLIC PANEL
ALBUMIN: 4.2 g/dL (ref 3.5–5.2)
ALT: 20 U/L (ref 0–35)
AST: 19 U/L (ref 0–37)
Alkaline Phosphatase: 74 U/L (ref 39–117)
BILIRUBIN TOTAL: 0.7 mg/dL (ref 0.2–1.2)
BUN: 13 mg/dL (ref 6–23)
CALCIUM: 9.4 mg/dL (ref 8.4–10.5)
CHLORIDE: 103 meq/L (ref 96–112)
CO2: 29 mEq/L (ref 19–32)
CREATININE: 0.69 mg/dL (ref 0.40–1.20)
GFR: 89.03 mL/min (ref >60.00)
Glucose, Bld: 109 mg/dL — ABNORMAL HIGH (ref 70–99)
Potassium: 4.9 mEq/L (ref 3.5–5.1)
SODIUM: 139 meq/L (ref 135–145)
TOTAL PROTEIN: 7.1 g/dL (ref 6.0–8.3)

## 2017-07-05 LAB — LIPID PANEL
CHOLESTEROL: 265 mg/dL — AB (ref 0–200)
HDL: 86.7 mg/dL (ref >39.00)
LDL CALC: 154 mg/dL — AB (ref 0–99)
NonHDL: 178.69
TRIGLYCERIDES: 125 mg/dL (ref 0.0–149.0)
Total CHOL/HDL Ratio: 3
VLDL: 25 mg/dL (ref 0.0–40.0)

## 2017-07-05 LAB — CBC WITH DIFFERENTIAL/PLATELET
BASOS ABS: 0 10*3/uL (ref 0.0–0.1)
Basophils Relative: 0.9 % (ref 0.0–3.0)
EOS ABS: 0 10*3/uL (ref 0.0–0.7)
Eosinophils Relative: 0.8 % (ref 0.0–5.0)
HEMATOCRIT: 44.7 % (ref 36.0–46.0)
HEMOGLOBIN: 14.7 g/dL (ref 12.0–15.0)
LYMPHS PCT: 31.4 % (ref 12.0–46.0)
Lymphs Abs: 1.7 10*3/uL (ref 0.7–4.0)
MCHC: 32.8 g/dL (ref 30.0–36.0)
MCV: 105 fl — ABNORMAL HIGH (ref 78.0–100.0)
MONO ABS: 0.5 10*3/uL (ref 0.1–1.0)
Monocytes Relative: 8.9 % (ref 3.0–12.0)
Neutro Abs: 3.2 10*3/uL (ref 1.4–7.7)
Neutrophils Relative %: 58 % (ref 43.0–77.0)
Platelets: 290 10*3/uL (ref 150.0–400.0)
RBC: 4.26 Mil/uL (ref 3.87–5.11)
RDW: 14.7 % (ref 11.5–15.5)
WBC: 5.6 10*3/uL (ref 4.0–10.5)

## 2017-07-05 LAB — HEMOGLOBIN A1C: HEMOGLOBIN A1C: 5.6 % (ref 4.6–6.5)

## 2017-07-05 LAB — TSH: TSH: 0.97 u[IU]/mL (ref 0.35–4.50)

## 2017-07-05 MED ORDER — ATORVASTATIN CALCIUM 10 MG PO TABS: 5.0000 mg | ORAL_TABLET | ORAL | 3 refills | Status: DC

## 2017-07-05 MED ORDER — RANITIDINE HCL 300 MG PO CAPS: 300.0000 mg | ORAL_CAPSULE | ORAL | 3 refills | Status: DC | PRN

## 2017-07-05 MED ORDER — RANITIDINE HCL 300 MG PO CAPS: 300.0000 mg | ORAL_CAPSULE | Freq: Every evening | ORAL | 3 refills | Status: DC

## 2017-07-05 MED ORDER — METOPROLOL SUCCINATE ER 25 MG PO TB24
25.0000 mg | ORAL_TABLET | Freq: Every day | ORAL | 2 refills | Status: DC
Start: 2017-07-05 — End: 2017-07-05

## 2017-07-05 MED ORDER — ATORVASTATIN CALCIUM 10 MG PO TABS: ORAL_TABLET | ORAL | 3 refills | Status: DC

## 2017-07-05 MED ORDER — METOPROLOL SUCCINATE ER 25 MG PO TB24: 12.5000 mg | ORAL_TABLET | Freq: Every day | ORAL | 2 refills | Status: DC

## 2017-07-05 NOTE — Assessment & Plan Note (Signed)
hgba1c acceptable, minimize simple carbs. Increase exercise as tolerated.  

## 2017-07-05 NOTE — Progress Notes (Signed)
Subjective:  I acted as a Education administrator for Dr. Charlett Blake. [name], [place]  Patient ID: Renee [name], female    DOB: 20-May-1946, 71 y.o.   MRN: [id number]  No chief complaint on file.   HPI  Patient is in today for a follow up and over all is doing well. She has noted some pressure in her right ear lately but no discharge or itching. No change in hearing or fevers, no head congestion. Is also noting intermittent epigsatric pain, no pattern such as eating or not eating has been identified. Denies CP/palp/SOB/HA/congestion/fevers or GU c/o. Taking meds as prescribed  Patient Care Team: [name], MD as PCP - General (Family Medicine) Melissa Montane, MD as Consulting Physician (Otolaryngology) Gatha Mayer, MD as Consulting Physician (Gastroenterology) Dr Shanon Brow Daughtry (Optometry) Michael Boston, MD as Consulting Physician (General Surgery) Love, Alyson Locket, MD as Consulting Physician (Neurology) Bernell List, CPhT as [facility] (Pharmacy Technician)   Past Medical History:  Diagnosis Date  . Abdominal pain 07/05/2017  . ABNORMAL INVOLUNTARY MOVEMENTS 01/01/2009   Qualifier: Diagnosis of  By: Linna Darner MD, Gwyndolyn Saxon    . Allergic rhinitis   . Allergic rhinitis 09/19/2008   Qualifier: Diagnosis of  By: Wynona Luna   . Cervical cancer screening 05/08/2013   Menarche 11, regular Menopause 52s G2P2 s/p 2 svd No abnormal paps or MGM Fatty cyst on labia removed.   . Cervical pain (neck) 10/07/2015  . CHEST PAIN 09/19/2008   Qualifier: Diagnosis of  By: Wynona Luna   . 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 Concussion 1969  . Diverticulosis 05/06/2017  . Essential hypertension 09/19/2008   Qualifier: Diagnosis of  By: Wynona Luna   . GERD (gastroesophageal reflux disease)   . H/O osteopenia 02/08/2016  . H/O tobacco use, presenting hazards to health 09/19/2008   Qualifier: Diagnosis of  By: Wynona Luna Smoking roughly 5 cig daily  Last cigarette was in December of 2016  . Head tremor 12/27/2016  . 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 Hyperglycemia 10/07/2016  . Hyperlipidemia   . Hyperlipidemia, mixed 10/03/2008   Qualifier: Diagnosis of  By: Wynona Luna   . Hypertension   . 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 Obesity 07/06/2016  . Occult blood positive stool   . Polyp of sigmoid colon 12/08/2016   11/2016 - Unable to remove or get bx at colonoscopy. Fixed sigmoid colon prevents. Referred for surgery.  Marland Kitchen PONV (postoperative nausea and vomiting)    ,   . Preventative health care 05/12/2013  . 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 Tobacco abuse   . Tremor, essential   . TREMOR, ESSENTIAL 09/19/2008   Qualifier: Diagnosis of  By: Wynona Luna     Past Surgical History:  Procedure Laterality Date  . INNER EAR SURGERY  2015   with tube  . MOUTH SURGERY  01/22/11   lower denture secured by titanium bolts  . rectal suction biospy    . XI ROBOTIC RESECTION OF SIGMOID COLON, RIGID PROCTOSCOPY N/A 02/09/2017   Performed by Michael Boston, MD at [facility]    Family History  Problem Relation Age of Onset  . Heart attack  Father 74       deceased  . Stroke Mother   . Hypertension Mother   . Alcohol abuse Mother   . Obesity Daughter   . Heart disease Paternal Grandmother        AAA rupture  . Heart disease Paternal Grandfather        MI  . Colon cancer Neg Hx   . Breast cancer Neg Hx   . Prostate cancer Neg Hx   . Diabetes Neg Hx     Social History   Socioeconomic History  . Marital status: Married    Spouse name: Not on file  . Number of children: Not on file  . Years of education: Not on file  . Highest education level: Not on file  Social Needs  . Financial resource strain: Not on file  . Food insecurity - worry: Not on file  . Food insecurity -  inability: Not on file  . Transportation needs - medical: Not on file  . Transportation needs - non-medical: Not on file  Occupational History  . Not on file  Tobacco Use  . Smoking status: Former Smoker    Packs/day: 0.50    Types: Cigarettes    Last attempt to quit: 09/07/2015    Years since quitting: 1.8  . Smokeless tobacco: Never Used  Substance and Sexual Activity  . Alcohol use: Yes    Alcohol/week: 8.4 oz    Types: 14 Glasses of wine per week    Comment: 3 glasses of wine daily   . Drug use: No  . Sexual activity: Not Currently  Other Topics Concern  . Not on file  Social History Narrative  . Not on file    Outpatient Medications Prior to Visit  Medication Sig Dispense Refill  . atorvastatin (LIPITOR) 10 MG tablet Take 0.5 tablets (5 mg total) 2 (two) times a week by mouth. TAKE 1 TABLET BY MOUTH EVERY TUESDAY AND SATURDAY 30 tablet 3  . b complex vitamins tablet Take 1 tablet by mouth every other day.    . Cholecalciferol (VITAMIN D PO) Take 1 tablet by mouth every other day. 3 times per week    . Coenzyme Q10 200 MG capsule Take 200 mg by mouth every other day. 3 times per week    . metoprolol succinate (TOPROL-XL) 25 MG 24 hr tablet Take 0.5 tablets (12.5 mg total) daily by mouth. 30 tablet 2  . Multiple Vitamin (MULTIVITAMIN WITH MINERALS) TABS tablet Take 1 tablet by mouth every other day.    . Probiotic Product (PROBIOTIC-10 PO) Take 1 capsule by mouth. Every Tuesday, Thursday, and Saturday in the am    . ranitidine (ZANTAC) 300 MG capsule Take 1 capsule (300 mg total) as needed by mouth for heartburn. 30 capsule 3   No facility-administered medications prior to visit.     Allergies  Allergen Reactions  . Amoxicillin     REACTION: TROUBLE BREATHING Has patient had a PCN reaction causing immediate rash, facial/tongue/throat swelling, SOB or lightheadedness with hypotension:Yes Has patient had a PCN reaction causing severe rash involving mucus membranes or skin  necrosis:No Has patient had a PCN reaction that required hospitalization:No Has patient had a PCN reaction occurring within the last 10 years:No If all of the above answers are "NO", then may proceed with Cephalosporin use.   Abe People Hfa [Albuterol] Other (See Comments)    Hypersensitivity, anxious, disoriented  . Penicillins Rash    Has patient had a PCN reaction  causing immediate rash, facial/tongue/throat swelling, SOB or lightheadedness with hypotension:unknown Has patient had a PCN reaction causing severe rash involving mucus membranes or skin necrosis:unknown Has patient had a PCN reaction that required hospitalization:no Has patient had a PCN reaction occurring within the last 10 years:no If all of the above answers are "NO", then may proceed with Cephalosporin use.     Review of Systems  Constitutional: Negative for fever and malaise/fatigue.  HENT: Negative for congestion.   Eyes: Negative for blurred vision.  Respiratory: Negative for cough and shortness of breath.   Cardiovascular: Negative for chest pain, palpitations and leg swelling.  Gastrointestinal: Positive for abdominal pain. Negative for blood in stool, diarrhea, nausea and vomiting.  Musculoskeletal: Negative for back pain.  Skin: Negative for rash.  Neurological: Negative for loss of consciousness and headaches.       Objective:    Physical Exam  Constitutional: She is oriented to person, place, and time. She appears well-developed and well-nourished. No distress.  HENT:  Head: Normocephalic and atraumatic.  Eyes: Conjunctivae are normal.  Neck: Normal range of motion. No thyromegaly present.  Cardiovascular: Normal rate and regular rhythm.  Pulmonary/Chest: Effort normal and breath sounds normal. She has no wheezes.  Abdominal: Soft. Bowel sounds are normal. There is no tenderness.  Musculoskeletal: Normal range of motion. She exhibits no edema or deformity.  Neurological: She is alert and oriented to  person, place, and time.  Skin: Skin is warm and dry. She is not diaphoretic.  Psychiatric: She has a normal mood and affect.    BP 130/80   Pulse 69   Temp 98 F (36.7 C) (Oral)   Resp 18   Wt 193 lb (87.5 kg)   SpO2 94%   BMI 30.68 kg/m  Wt Readings from Last 3 Encounters:  07/05/17 193 lb (87.5 kg)  07/05/17 193 lb (87.5 kg)  05/10/17 189 lb 6.4 oz (85.9 kg)   BP Readings from Last 3 Encounters:  07/05/17 130/80  07/05/17 (!) 158/92  05/10/17 (!) 162/94     Immunization History  Administered Date(s) Administered  . Pneumococcal Conjugate-13 07/06/2016    Health Maintenance  Topic Date Due  . TETANUS/TDAP  01/30/1965  . PNA vac Low Risk Adult (2 of 2 - PPSV23) 07/06/2017  . Hepatitis C Screening  08/15/2017 (Originally 1946-04-05)  . INFLUENZA VACCINE  05/11/2018 (Originally 03/23/2017)  . COLONOSCOPY  12/01/2017  . MAMMOGRAM  09/02/2018  . DEXA SCAN  Completed    Lab Results  Component Value Date   WBC 5.6 07/05/2017   HGB 14.7 07/05/2017   HCT 44.7 07/05/2017   PLT 290.0 07/05/2017   GLUCOSE 109 (H) 07/05/2017   CHOL 265 (H) 07/05/2017   TRIG 125.0 07/05/2017   HDL 86.70 07/05/2017   LDLDIRECT 148.6 09/26/2008   LDLCALC 154 (H) 07/05/2017   ALT 20 07/05/2017   AST 19 07/05/2017   NA 139 07/05/2017   K 4.9 07/05/2017   CL 103 07/05/2017   CREATININE 0.69 07/05/2017   BUN 13 07/05/2017   CO2 29 07/05/2017   TSH 0.97 07/05/2017   HGBA1C 5.6 07/05/2017    Lab Results  Component Value Date   TSH 0.97 07/05/2017   Lab Results  Component Value Date   WBC 5.6 07/05/2017   HGB 14.7 07/05/2017   HCT 44.7 07/05/2017   MCV 105.0 (H) 07/05/2017   PLT 290.0 07/05/2017   Lab Results  Component Value Date   NA 139 07/05/2017   K  4.9 07/05/2017   CO2 29 07/05/2017   GLUCOSE 109 (H) 07/05/2017   BUN 13 07/05/2017   CREATININE 0.69 07/05/2017   BILITOT 0.7 07/05/2017   ALKPHOS 74 07/05/2017   AST 19 07/05/2017   ALT 20 07/05/2017   PROT 7.1  07/05/2017   ALBUMIN 4.2 07/05/2017   CALCIUM 9.4 07/05/2017   ANIONGAP 6 02/10/2017   GFR 89.03 07/05/2017   Lab Results  Component Value Date   CHOL 265 (H) 07/05/2017   Lab Results  Component Value Date   HDL 86.70 07/05/2017   Lab Results  Component Value Date   LDLCALC 154 (H) 07/05/2017   Lab Results  Component Value Date   TRIG 125.0 07/05/2017   Lab Results  Component Value Date   CHOLHDL 3 07/05/2017   Lab Results  Component Value Date   HGBA1C 5.6 07/05/2017         Assessment & Plan:   Problem List Items Addressed This Visit    Hyperlipidemia, mixed    Tolerating statin, encouraged heart healthy diet, avoid trans fats, minimize simple carbs and saturated fats. Increase exercise as tolerated      Relevant Orders   Lipid panel (Completed)   Essential hypertension    Well controlled, no changes to meds. Encouraged heart healthy diet such as the DASH diet and exercise as tolerated.       Relevant Orders   CBC with Differential/Platelet (Completed)   Comprehensive metabolic panel (Completed)   TSH (Completed)   Obesity    Encouraged DASH diet, decrease po intake and increase exercise as tolerated. Needs 7-8 hours of sleep nightly. Avoid trans fats, eat small, frequent meals every 4-5 hours with lean proteins, complex carbs and healthy fats. Minimize simple carbs      Hyperglycemia    hgba1c acceptable, minimize simple carbs. Increase exercise as tolerated      Relevant Orders   Hemoglobin A1c (Completed)   Abdominal pain    Epigastric and intermittent. Avoid offending foods and use Ranitidine routinely, Tums prn if no improvement or worsens will need to see gastroenterology and/or proceed with imaging         I am having Aneliz L. Hossain maintain her b complex vitamins, Cholecalciferol (VITAMIN D PO), Probiotic Product (PROBIOTIC-10 PO), multivitamin with minerals, Coenzyme Q10, metoprolol succinate, ranitidine, and atorvastatin.  No orders  of the defined types were placed in this encounter.   CMA served as Education administrator during this visit. History, Physical and Plan performed by medical provider. Documentation and orders reviewed and attested to.  Penni Homans, MD

## 2017-07-05 NOTE — Assessment & Plan Note (Signed)
Well controlled, no changes to meds. Encouraged heart healthy diet such as the DASH diet and exercise as tolerated.  °

## 2017-07-05 NOTE — Patient Instructions (Addendum)
Medication Instructions:  Your physician has recommended you make the following change in your medication:  CHANGE metoprolol 12.5 mg daily CHANGE atorvastatin 5 mg twice weekly CHANGE zantac 300 mg as needed  Labwork: None  Testing/Procedures: None  Follow-Up: Your physician recommends that you schedule a follow-up appointment as needed if symptoms worsen or fail to improve.  Any Other Special Instructions Will Be Listed Below (If Applicable).     If you need a refill on your cardiac medications before your next appointment, please call your pharmacy.    Check your home BP daily for 1 month and record and follow up with your primary doctor.   Hypertension Hypertension is another name for high blood pressure. High blood pressure forces your heart to work harder to pump blood. This can cause problems over time. There are two numbers in a blood pressure reading. There is a top number (systolic) over a bottom number (diastolic). It is best to have a blood pressure below 120/80. Healthy choices can help lower your blood pressure. You may need medicine to help lower your blood pressure if:  Your blood pressure cannot be lowered with healthy choices.  Your blood pressure is higher than 130/80.  Follow these instructions at home: Eating and drinking  If directed, follow the DASH eating plan. This diet includes: ? Filling half of your plate at each meal with fruits and vegetables. ? Filling one quarter of your plate at each meal with whole grains. Whole grains include whole wheat pasta, brown rice, and whole grain bread. ? Eating or drinking low-fat dairy products, such as skim milk or low-fat yogurt. ? Filling one quarter of your plate at each meal with low-fat (lean) proteins. Low-fat proteins include fish, skinless chicken, eggs, beans, and tofu. ? Avoiding fatty meat, cured and processed meat, or chicken with skin. ? Avoiding premade or processed food.  Eat less than 1,500  mg of salt (sodium) a day.  Limit alcohol use to no more than 1 drink a day for nonpregnant women and 2 drinks a day for men. One drink equals 12 oz of beer, 5 oz of wine, or 1 oz of hard liquor. Lifestyle  Work with your doctor to stay at a healthy weight or to lose weight. Ask your doctor what the best weight is for you.  Get at least 30 minutes of exercise that causes your heart to beat faster (aerobic exercise) most days of the week. This may include walking, swimming, or biking.  Get at least 30 minutes of exercise that strengthens your muscles (resistance exercise) at least 3 days a week. This may include lifting weights or pilates.  Do not use any products that contain nicotine or tobacco. This includes cigarettes and e-cigarettes. If you need help quitting, ask your doctor.  Check your blood pressure at home as told by your doctor.  Keep all follow-up visits as told by your doctor. This is important. Medicines  Take over-the-counter and prescription medicines only as told by your doctor. Follow directions carefully.  Do not skip doses of blood pressure medicine. The medicine does not work as well if you skip doses. Skipping doses also puts you at risk for problems.  Ask your doctor about side effects or reactions to medicines that you should watch for. Contact a doctor if:  You think you are having a reaction to the medicine you are taking.  You have headaches that keep coming back (recurring).  You feel dizzy.  You have swelling in  your ankles.  You have trouble with your vision. Get help right away if:  You get a very bad headache.  You start to feel confused.  You feel weak or numb.  You feel faint.  You get very bad pain in your: ? Chest. ? Belly (abdomen).  You throw up (vomit) more than once.  You have trouble breathing. Summary  Hypertension is another name for high blood pressure.  Making healthy choices can help lower blood pressure. If your  blood pressure cannot be controlled with healthy choices, you may need to take medicine. This information is not intended to replace advice given to you by your health care provider. Make sure you discuss any questions you have with your health care provider. Document Released: 01/26/2008 Document Revised: 07/07/2016 Document Reviewed: 07/07/2016 Elsevier Interactive Patient Education  [employer].

## 2017-07-05 NOTE — Patient Instructions (Addendum)
64 oz of fluids, Benefiber twice daily and probiotics  Hypertension Hypertension, commonly called high blood pressure, is when the force of blood pumping through the arteries is too strong. The arteries are the blood vessels that carry blood from the heart throughout the body. Hypertension forces the heart to work harder to pump blood and may cause arteries to become narrow or stiff. Having untreated or uncontrolled hypertension can cause heart attacks, strokes, kidney disease, and other problems. A blood pressure reading consists of a higher number over a lower number. Ideally, your blood pressure should be below 120/80. The first ("top") number is called the systolic pressure. It is a measure of the pressure in your arteries as your heart beats. The second ("bottom") number is called the diastolic pressure. It is a measure of the pressure in your arteries as the heart relaxes. What are the causes? The cause of this condition is not known. What increases the risk? Some risk factors for high blood pressure are under your control. Others are not. Factors you can change  Smoking.  Having type 2 diabetes mellitus, high cholesterol, or both.  Not getting enough exercise or physical activity.  Being overweight.  Having too much fat, sugar, calories, or salt (sodium) in your diet.  Drinking too much alcohol. Factors that are difficult or impossible to change  Having chronic kidney disease.  Having a family history of high blood pressure.  Age. Risk increases with age.  Race. You may be at higher risk if you are African-American.  Gender. Men are at higher risk than women before age 91. After age 32, women are at higher risk than men.  Having obstructive sleep apnea.  Stress. What are the signs or symptoms? Extremely high blood pressure (hypertensive crisis) may cause:  Headache.  Anxiety.  Shortness of breath.  Nosebleed.  Nausea and vomiting.  Severe chest pain.  Jerky  movements you cannot control (seizures).  How is this diagnosed? This condition is diagnosed by measuring your blood pressure while you are seated, with your arm resting on a surface. The cuff of the blood pressure monitor will be placed directly against the skin of your upper arm at the level of your heart. It should be measured at least twice using the same arm. Certain conditions can cause a difference in blood pressure between your right and left arms. Certain factors can cause blood pressure readings to be lower or higher than normal (elevated) for a short period of time:  When your blood pressure is higher when you are in a health care provider's office than when you are at home, this is called white coat hypertension. Most people with this condition do not need medicines.  When your blood pressure is higher at home than when you are in a health care provider's office, this is called masked hypertension. Most people with this condition may need medicines to control blood pressure.  If you have a high blood pressure reading during one visit or you have normal blood pressure with other risk factors:  You may be asked to return on a different day to have your blood pressure checked again.  You may be asked to monitor your blood pressure at home for 1 week or longer.  If you are diagnosed with hypertension, you may have other blood or imaging tests to help your health care provider understand your overall risk for other conditions. How is this treated? This condition is treated by making healthy lifestyle changes, such as eating  healthy foods, exercising more, and reducing your alcohol intake. Your health care provider may prescribe medicine if lifestyle changes are not enough to get your blood pressure under control, and if:  Your systolic blood pressure is above 130.  Your diastolic blood pressure is above 80.  Your personal target blood pressure may vary depending on your medical  conditions, your age, and other factors. Follow these instructions at home: Eating and drinking  Eat a diet that is high in fiber and potassium, and low in sodium, added sugar, and fat. An example eating plan is called the DASH (Dietary Approaches to Stop Hypertension) diet. To eat this way: ? Eat plenty of fresh fruits and vegetables. Try to fill half of your plate at each meal with fruits and vegetables. ? Eat whole grains, such as whole wheat pasta, brown rice, or whole grain bread. Fill about one quarter of your plate with whole grains. ? Eat or drink low-fat dairy products, such as skim milk or low-fat yogurt. ? Avoid fatty cuts of meat, processed or cured meats, and poultry with skin. Fill about one quarter of your plate with lean proteins, such as fish, chicken without skin, beans, eggs, and tofu. ? Avoid premade and processed foods. These tend to be higher in sodium, added sugar, and fat.  Reduce your daily sodium intake. Most people with hypertension should eat less than 1,500 mg of sodium a day.  Limit alcohol intake to no more than 1 drink a day for nonpregnant women and 2 drinks a day for men. One drink equals 12 oz of beer, 5 oz of wine, or 1 oz of hard liquor. Lifestyle  Work with your health care provider to maintain a healthy body weight or to lose weight. Ask what an ideal weight is for you.  Get at least 30 minutes of exercise that causes your heart to beat faster (aerobic exercise) most days of the week. Activities may include walking, swimming, or biking.  Include exercise to strengthen your muscles (resistance exercise), such as pilates or lifting weights, as part of your weekly exercise routine. Try to do these types of exercises for 30 minutes at least 3 days a week.  Do not use any products that contain nicotine or tobacco, such as cigarettes and e-cigarettes. If you need help quitting, ask your health care provider.  Monitor your blood pressure at home as told by  your health care provider.  Keep all follow-up visits as told by your health care provider. This is important. Medicines  Take over-the-counter and prescription medicines only as told by your health care provider. Follow directions carefully. Blood pressure medicines must be taken as prescribed.  Do not skip doses of blood pressure medicine. Doing this puts you at risk for problems and can make the medicine less effective.  Ask your health care provider about side effects or reactions to medicines that you should watch for. Contact a health care provider if:  You think you are having a reaction to a medicine you are taking.  You have headaches that keep coming back (recurring).  You feel dizzy.  You have swelling in your ankles.  You have trouble with your vision. Get help right away if:  You develop a severe headache or confusion.  You have unusual weakness or numbness.  You feel faint.  You have severe pain in your chest or abdomen.  You vomit repeatedly.  You have trouble breathing. Summary  Hypertension is when the force of blood pumping through  your arteries is too strong. If this condition is not controlled, it may put you at risk for serious complications.  Your personal target blood pressure may vary depending on your medical conditions, your age, and other factors. For most people, a normal blood pressure is less than 120/80.  Hypertension is treated with lifestyle changes, medicines, or a combination of both. Lifestyle changes include weight loss, eating a healthy, low-sodium diet, exercising more, and limiting alcohol. This information is not intended to replace advice given to you by your health care provider. Make sure you discuss any questions you have with your health care provider. Document Released: 08/09/2005 Document Revised: 07/07/2016 Document Reviewed: 07/07/2016 Elsevier Interactive Patient Education  [employer].

## 2017-07-05 NOTE — Progress Notes (Signed)
Cardiology Office Note:    Date:  07/05/2017   ID:  Renee [name], DOB Apr 08, 1946, MRN [id number]  PCP:  [name], MD  Cardiologist:  Shirlee More, MD    Referring MD: [name], MD    ASSESSMENT:    1. Chest pain, unspecified type   2. Essential hypertension   3. PVC's (premature ventricular contractions)    PLAN:    In order of problems listed above:  1. Stable no recurrence her stress test was normal at this time we will not perform any further cardiac ischemia evaluation 2. I suspect she requires additional antihypertensive therapy I gave her the option of adding an ARB following home blood pressures for 1 month she prefers the latter and will record and follow-up with your PCP.  If above target I would start her on ARB with a goal of systolic blood pressure less than 140 3. Stable she had no high risk markers and her stress test and beta-blocker is appropriate   Next appointment: As needed   Medication Adjustments/Labs and Tests Ordered: Current medicines are reviewed at length with the patient today.  Concerns regarding medicines are outlined above.  No orders of the defined types were placed in this encounter.  Meds ordered this encounter  Medications  . metoprolol succinate (TOPROL-XL) 25 MG 24 hr tablet    Sig: Take 1 tablet (25 mg total) daily by mouth.    Dispense:  30 tablet    Refill:  2  . atorvastatin (LIPITOR) 10 MG tablet    Sig: TAKE 1 TABLET BY MOUTH EVERY TUESDAY AND SATURDAY    Dispense:  10 tablet    Refill:  3  . ranitidine (ZANTAC) 300 MG capsule    Sig: Take 1 capsule (300 mg total) every evening by mouth.    Dispense:  30 capsule    Refill:  3    Chief Complaint  Patient presents with  . Follow-up    Stress test  . Hypertension    History of Present Illness:    Renee [name] is a 71 y.o. female with a hx of hypertension hyperlipidemia and chest pain last seen 2 months ago.  In the interim she has undergone a stress  echocardiogram. Compliance with diet, lifestyle and medications: Yes She has had no further chest pain her stress echo was normal Past Medical History:  Diagnosis Date  . ABNORMAL INVOLUNTARY MOVEMENTS 01/01/2009   Qualifier: Diagnosis of  By: Linna Darner MD, Gwyndolyn Saxon    . Allergic rhinitis   . Allergic rhinitis 09/19/2008   Qualifier: Diagnosis of  By: Wynona Luna   . Cervical cancer screening 05/08/2013   Menarche 11, regular Menopause 35s G2P2 s/p 2 svd No abnormal paps or MGM Fatty cyst on labia removed.   . Cervical pain (neck) 10/07/2015  . CHEST PAIN 09/19/2008   Qualifier: Diagnosis of  By: Wynona Luna   . 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 Concussion 1969  . Diverticulosis 05/06/2017  . Essential hypertension 09/19/2008   Qualifier: Diagnosis of  By: Wynona Luna   . GERD (gastroesophageal reflux disease)   . H/O osteopenia 02/08/2016  . H/O tobacco use, presenting hazards to health 09/19/2008   Qualifier: Diagnosis of  By: Wynona Luna Smoking roughly 5 cig daily  Last cigarette was in December of 2016  . Head tremor 12/27/2016  .  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 Hyperglycemia 10/07/2016  . Hyperlipidemia   . Hyperlipidemia, mixed 10/03/2008   Qualifier: Diagnosis of  By: Wynona Luna   . Hypertension   . 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 Obesity 07/06/2016  . Occult blood positive stool   . Polyp of sigmoid colon 12/08/2016   11/2016 - Unable to remove or get bx at colonoscopy. Fixed sigmoid colon prevents. Referred for surgery.  Marland Kitchen PONV (postoperative nausea and vomiting)    ,   . Preventative health care 05/12/2013  . 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 Tobacco abuse   . Tremor, essential   . TREMOR, ESSENTIAL 09/19/2008   Qualifier: Diagnosis of  By: Wynona Luna     Past  Surgical History:  Procedure Laterality Date  . INNER EAR SURGERY  2015   with tube  . MOUTH SURGERY  01/22/11   lower denture secured by titanium bolts  . rectal suction biospy      Current Medications: Current Meds  Medication Sig  . atorvastatin (LIPITOR) 10 MG tablet TAKE 1 TABLET BY MOUTH EVERY TUESDAY AND SATURDAY  . b complex vitamins tablet Take 1 tablet by mouth every other day.  . Cholecalciferol (VITAMIN D PO) Take 1 tablet by mouth every other day. 3 times per week  . Coenzyme Q10 200 MG capsule Take 200 mg by mouth every other day. 3 times per week  . metoprolol succinate (TOPROL-XL) 25 MG 24 hr tablet Take 1 tablet (25 mg total) daily by mouth.  . Multiple Vitamin (MULTIVITAMIN WITH MINERALS) TABS tablet Take 1 tablet by mouth every other day.  . Probiotic Product (PROBIOTIC-10 PO) Take 1 capsule by mouth. Every Tuesday, Thursday, and Saturday in the am  . ranitidine (ZANTAC) 300 MG capsule Take 1 capsule (300 mg total) every evening by mouth.  . [DISCONTINUED] atorvastatin (LIPITOR) 10 MG tablet TAKE 1 TABLET BY MOUTH EVERY TUESDAY AND SATURDAY (Patient taking differently: TAKE 0.5 TABLET BY MOUTH EVERY TUESDAY AND SATURDAY)  . [DISCONTINUED] metoprolol succinate (TOPROL-XL) 25 MG 24 hr tablet TAKE 1 TABLET BY MOUTH EVERY DAY (Patient taking differently: TAKE 0.5 TABLET BY MOUTH EVERY DAY)  . [DISCONTINUED] ranitidine (ZANTAC) 300 MG capsule Take 1 capsule (300 mg total) by mouth every evening. (Patient taking differently: Take 150 mg by mouth daily as needed (for heartburn/indigestion.). Only as needed)     Allergies:   Amoxicillin; Proair hfa [albuterol]; and Penicillins   Social History   Socioeconomic History  . Marital status: Married    Spouse name: None  . Number of children: None  . Years of education: None  . Highest education level: None  Social Needs  . Financial resource strain: None  . Food insecurity - worry: None  . Food insecurity - inability: None    . Transportation needs - medical: None  . Transportation needs - non-medical: None  Occupational History  . None  Tobacco Use  . Smoking status: Former Smoker    Packs/day: 0.50    Types: Cigarettes    Last attempt to quit: 09/07/2015    Years since quitting: 1.8  . Smokeless tobacco: Never Used  Substance and Sexual Activity  . Alcohol use: Yes    Alcohol/week: 8.4 oz    Types: 14 Glasses of wine per week  Comment: 3 glasses of wine daily   . Drug use: No  . Sexual activity: Not Currently  Other Topics Concern  . None  Social History Narrative  . None     Family History: The patient's family history includes Alcohol abuse in her mother; Heart attack (age of onset: 78) in her father; Heart disease in her paternal grandfather and paternal grandmother; Hypertension in her mother; Obesity in her daughter; Stroke in her mother. There is no history of Colon cancer, Breast cancer, Prostate cancer, or Diabetes. ROS:   Please see the history of present illness.    All other systems reviewed and are negative.  EKGs/Labs/Other Studies Reviewed:    The following studies were reviewed today:  Stress echo:  -Stress ECG:  There were no stress arrhythmias or conduction abnormalities.  The stress ECG was negative for ischemia. The stress ECG was non-diagnostic.   with baseline ST abnormality. - Stress ECG conclusions: There were no stress arrhythmias or   conduction abnormalities. The stress ECG was negative for   ischemia. The stress ECG was non-diagnostic. - Staged echo: There was no echocardiographic evidence for   stress-induced ischemia. - Resting hypertension is noted. Recent Labs: 12/28/2016: TSH 1.07 05/03/2017: ALT 21; BUN 17; Creatinine, Ser 0.72; Hemoglobin 14.1; Magnesium 2.2; Platelets 253.0; Potassium 4.4; Sodium 138  Recent Lipid Panel    Component Value Date/Time   CHOL 223 (H) 03/31/2017 0729   TRIG 115.0 03/31/2017 0729   HDL 76.30 03/31/2017 0729   CHOLHDL 3  03/31/2017 0729   VLDL 23.0 03/31/2017 0729   LDLCALC 123 (H) 03/31/2017 0729   LDLDIRECT 148.6 09/26/2008 0811    Physical Exam:    VS:  BP (!) 158/92 (BP Location: Right Arm, Patient Position: Sitting, Cuff Size: Normal)   Pulse 86   Ht 5' 6.5" (1.689 m)   Wt 193 lb (87.5 kg)   SpO2 96%   BMI 30.68 kg/m     Wt Readings from Last 3 Encounters:  07/05/17 193 lb (87.5 kg)  05/10/17 189 lb 6.4 oz (85.9 kg)  02/12/17 195 lb 12.3 oz (88.8 kg)     GEN:  Well nourished, well developed in no acute distress HEENT: Normal NECK: No JVD; No carotid bruits LYMPHATICS: No lymphadenopathy CARDIAC: RRR, no murmurs, rubs, gallops RESPIRATORY:  Clear to auscultation without rales, wheezing or rhonchi  ABDOMEN: Soft, non-tender, non-distended MUSCULOSKELETAL:  No edema; No deformity  SKIN: Warm and dry NEUROLOGIC:  Alert and oriented x 3 PSYCHIATRIC:  Normal affect    Signed, Shirlee More, MD  07/05/2017 9:35 AM    [place]

## 2017-07-05 NOTE — Assessment & Plan Note (Addendum)
Epigastric and intermittent. Avoid offending foods and use Ranitidine routinely, Tums prn if no improvement or worsens will need to see gastroenterology and/or proceed with imaging

## 2017-07-05 NOTE — Addendum Note (Signed)
Addended by: Jossie Ng on: 07/05/2017 09:41 AM   Modules accepted: Orders

## 2017-07-05 NOTE — Assessment & Plan Note (Signed)
Tolerating statin, encouraged heart healthy diet, avoid trans fats, minimize simple carbs and saturated fats. Increase exercise as tolerated 

## 2017-07-05 NOTE — Assessment & Plan Note (Signed)
Encouraged DASH diet, decrease po intake and increase exercise as tolerated. Needs 7-8 hours of sleep nightly. Avoid trans fats, eat small, frequent meals every 4-5 hours with lean proteins, complex carbs and healthy fats. Minimize simple carbs 

## 2017-07-12 ENCOUNTER — Telehealth: Payer: Self-pay | Admitting: Family Medicine

## 2017-07-12 DIAGNOSIS — H2513 Age-related nuclear cataract, bilateral: Secondary | ICD-10-CM | POA: Diagnosis not present

## 2017-07-12 DIAGNOSIS — H1045 Other chronic allergic conjunctivitis: Secondary | ICD-10-CM | POA: Diagnosis not present

## 2017-07-12 DIAGNOSIS — H524 Presbyopia: Secondary | ICD-10-CM | POA: Diagnosis not present

## 2017-07-12 NOTE — Telephone Encounter (Signed)
Copied from [place] [phone]. Topic: Quick Communication - Office Called Patient >> Jul 12, 2017  2:31 PM Synthia Innocent wrote: Reason for CRM: Patient states office called her on Friday, do not see any CRM/phones notes. Please advise  Patients calls back was regarding her lab results/ PCP CMA had called left message on lab results/07/08/2017 I did inform her of her results/instructions. She verbalized understanding. Will forward message to PCP The patient stated she is taking atorvastatin 5 mg twice daily and instructions were to start on crestor? She would like to know what to do as results had increased.

## 2017-07-12 NOTE — Telephone Encounter (Signed)
Not responding to Atorvastatin. I think Crestor will work better for her. Rosuvastatin 5 mg po qhs, disp #30 with 3 rf

## 2017-07-13 ENCOUNTER — Telehealth: Payer: Self-pay | Admitting: Family Medicine

## 2017-07-13 MED ORDER — ROSUVASTATIN CALCIUM 5 MG PO TABS: 5.0000 mg | ORAL_TABLET | Freq: Every day | ORAL | 3 refills | Status: DC

## 2017-07-13 NOTE — Telephone Encounter (Signed)
Sent in crestor/updated list/called the patient left message to call back to get PCP's instructions.

## 2017-07-13 NOTE — Telephone Encounter (Signed)
Copied from [place]. Topic: Quick Communication - See Telephone Encounter >> Jul 13, 2017 10:39 AM Synthia Innocent wrote: CRM for notification. See Telephone encounter for:  Patient returning call, do see where CMA called patient, did not list if triage could speak with patient, forward back to provider. Unable to link to old phone note 07/13/17.

## 2017-07-13 NOTE — Telephone Encounter (Signed)
See Telephone Encounter. 

## 2017-07-20 NOTE — Telephone Encounter (Signed)
Notified pt and she states she wanted to remind PCP that she had decreased her atorvastatin prior to last visit (5mg  twice a week) and had also been on vacation prior to [facility] and did not follow diet. Pt also concerned that Crestor says not to take antacids 2 hours prior to medication and she has to take Zantac intermittently.  Pt would rather stay on Atorvastatin and maybe increase dose. She has already picked up crestor but states it only costed her $2 and she doesn't mind tossing it out if PCP ok to leave her on atorvastatin?

## 2017-07-20 NOTE — Telephone Encounter (Signed)
Ok to stay on Atorvastatin but would recommend she take it qod and try to watch carb and fat intake.

## 2017-07-22 MED ORDER — ATORVASTATIN CALCIUM 10 MG PO TABS: 5.0000 mg | ORAL_TABLET | ORAL | 5 refills | Status: DC

## 2017-07-22 NOTE — Telephone Encounter (Signed)
Notified pt and she voices understanding. 

## 2017-07-22 NOTE — Addendum Note (Signed)
Addended by: Kelle Darting A on: 07/22/2017 05:23 PM   Modules accepted: Orders

## 2017-08-25 ENCOUNTER — Other Ambulatory Visit: Payer: Self-pay | Admitting: Family Medicine

## 2017-08-26 ENCOUNTER — Ambulatory Visit (INDEPENDENT_AMBULATORY_CARE_PROVIDER_SITE_OTHER): Payer: Medicare Other | Admitting: Family Medicine

## 2017-08-26 ENCOUNTER — Encounter: Payer: Self-pay | Admitting: Family Medicine

## 2017-08-26 VITALS — BP 132/88 | HR 75 | Temp 98.2°F | Ht 66.5 in | Wt 197.0 lb

## 2017-08-26 DIAGNOSIS — M25512 Pain in left shoulder: Secondary | ICD-10-CM

## 2017-08-26 NOTE — Progress Notes (Signed)
Musculoskeletal Exam  Patient: Renee [name] DOB: [birth year]  DOS: 08/26/2017  SUBJECTIVE:  Chief Complaint:   Chief Complaint  Patient presents with  . Arm Pain    left     Renee [name] is a 72 y.o.  female for evaluation and treatment of L arm pain.   Onset:  1 week ago. No injury, has been more active with carrying presents.  Location: L upper arm and L upper chest; worried it is heart related. Character:  aching  Progression of issue:  is unchanged Associated symptoms: Feels tight Treatment: to date has been OTC NSAIDS.   Neurovascular symptoms: no  ROS: Musculoskeletal/Extremities: +L arm pain  Past Medical History:  Diagnosis Date  . Abdominal pain 07/05/2017  . ABNORMAL INVOLUNTARY MOVEMENTS 01/01/2009   Qualifier: Diagnosis of  By: Linna Darner MD, Gwyndolyn Saxon    . Allergic rhinitis   . Allergic rhinitis 09/19/2008   Qualifier: Diagnosis of  By: Wynona Luna   . Cervical cancer screening 05/08/2013   Menarche 11, regular Menopause 80s G2P2 s/p 2 svd No abnormal paps or MGM Fatty cyst on labia removed.   . Cervical pain (neck) 10/07/2015  . CHEST PAIN 09/19/2008   Qualifier: Diagnosis of  By: Wynona Luna   . 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 Concussion 1969  . Diverticulosis 05/06/2017  . Essential hypertension 09/19/2008   Qualifier: Diagnosis of  By: Wynona Luna   . GERD (gastroesophageal reflux disease)   . H/O osteopenia 02/08/2016  . H/O tobacco use, presenting hazards to health 09/19/2008   Qualifier: Diagnosis of  By: Wynona Luna Smoking roughly 5 cig daily  Last cigarette was in December of 2016  . Head tremor 12/27/2016  . 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 Hyperglycemia 10/07/2016  . Hyperlipidemia   . Hyperlipidemia, mixed 10/03/2008   Qualifier: Diagnosis of  By: Wynona Luna   . Hypertension   . 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 Obesity 07/06/2016  . Occult blood positive stool   . Polyp of sigmoid colon 12/08/2016   11/2016 - Unable to remove or get bx at colonoscopy. Fixed sigmoid colon prevents. Referred for surgery.  Marland Kitchen PONV (postoperative nausea and vomiting)    ,   . Preventative health care 05/12/2013  . 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 Tobacco abuse   . Tremor, essential   . TREMOR, ESSENTIAL 09/19/2008   Qualifier: Diagnosis of  By: Wynona Luna     Objective: VITAL SIGNS: BP 132/88 (BP Location: Left Arm, Patient Position: Sitting, Cuff Size: Large)   Pulse 75   Temp 98.2 F (36.8 C) (Oral)   Ht 5' 6.5" (1.689 m)   Wt 197 lb (89.4 kg)   SpO2 98%   BMI 31.32 kg/m  Constitutional: Well formed, well developed. No acute distress. Cardiovascular: RRR, no murmurs, no bruits, Brisk cap refill Thorax & Lungs: CTAB, No accessory muscle use Musculoskeletal: L shoulder.   Normal active range of motion: no.   Normal passive range of motion: no Tenderness to palpation: yes- lateral pec, ant deltoid Deformity: no Ecchymosis: no Tests positive: Speed's, Neer's elicited pain, but not over supraspinatus Tests negative: Hawkins, lift off, cross over Neurologic: Normal sensory  function. No focal deficits noted. DTR's equal and symmetry in UE's. No clonus. Psychiatric: Normal mood. Age appropriate judgment and insight. Alert & oriented x 3.    Assessment:  Acute pain of left shoulder  Plan: NSAIDs, Tylenol, heat, ice, stretches for shoulder.  Explained that this her hx and exam are not suggestive of a cardiac etiology. We discussed what typical chest pain does look like and if she experiences anything like that, she is to seek immediate care. F/u prn. The patient voiced understanding and agreement to the plan.   [place], DO 08/26/17  9:01 AM

## 2017-08-26 NOTE — Progress Notes (Signed)
Pre visit review using our clinic review tool, if applicable. No additional management support is needed unless otherwise documented below in the visit note. 

## 2017-08-26 NOTE — Patient Instructions (Signed)
Ice/cold pack over area for 10-15 min every 2-3 hours while awake.  Heat (pad or rice pillow in microwave) over affected area, 10-15 minutes every 2-3 hours while awake.   Ibuprofen 400-600 mg (2-3 over the counter strength tabs) every 6 hours as needed for pain.  OK to take Tylenol 1000 mg (2 extra strength tabs) or 975 mg (3 regular strength tabs) every 6 hours as needed.  EXERCISES  RANGE OF MOTION (ROM) AND STRETCHING EXERCISES These exercises may help you when beginning to rehabilitate your injury. While completing these exercises, remember:   Restoring tissue flexibility helps normal motion to return to the joints. This allows healthier, less painful movement and activity.  An effective stretch should be held for at least 30 seconds.  A stretch should never be painful. You should only feel a gentle lengthening or release in the stretched tissue.  ROM - Pendulum  Bend at the waist so that your right / left arm falls away from your body. Support yourself with your opposite hand on a solid surface, such as a table or a countertop.  Your right / left arm should be perpendicular to the ground. If it is not perpendicular, you need to lean over farther. Relax the muscles in your right / left arm and shoulder as much as possible.  Gently sway your hips and trunk so they move your right / left arm without any use of your right / left shoulder muscles.  Progress your movements so that your right / left arm moves side to side, then forward and backward, and finally, both clockwise and counterclockwise.  Complete 10-15 repetitions in each direction. Many people use this exercise to relieve discomfort in their shoulder as well as to gain range of motion. Repeat 2 times. Complete this exercise 3 times per week.  STRETCH - Flexion, Standing  Stand with good posture. With an underhand grip on your right / left hand and an overhand grip on the opposite hand, grasp a broomstick or cane so that  your hands are a little more than shoulder-width apart.  Keeping your right / left elbow straight and shoulder muscles relaxed, push the stick with your opposite hand to raise your right / left arm in front of your body and then overhead. Raise your arm until you feel a stretch in your right / left shoulder, but before you have increased shoulder pain.  Try to avoid shrugging your right / left shoulder as your arm rises by keeping your shoulder blade tucked down and toward your mid-back spine. Hold 30 seconds.  Slowly return to the starting position. Repeat 2 times. Complete this exercise 3 times per week.  STRETCH - Internal Rotation  Place your right / left hand behind your back, palm-up.  Throw a towel or belt over your opposite shoulder. Grasp the towel/belt with your right / left hand.  While keeping an upright posture, gently pull up on the towel/belt until you feel a stretch in the front of your right / left shoulder.  Avoid shrugging your right / left shoulder as your arm rises by keeping your shoulder blade tucked down and toward your mid-back spine.  Hold 30. Release the stretch by lowering your opposite hand. Repeat 2 times. Complete this exercise 3 times per week.  STRETCH - External Rotation and Abduction  Stagger your stance through a doorframe. It does not matter which foot is forward.  As instructed by your physician, physical therapist or athletic trainer, place your hands: ?  And forearms above your head and on the door frame. ? And forearms at head-height and on the door frame. ? At elbow-height and on the door frame.  Keeping your head and chest upright and your stomach muscles tight to prevent over-extending your low-back, slowly shift your weight onto your front foot until you feel a stretch across your chest and/or in the front of your shoulders.  Hold 30 seconds. Shift your weight to your back foot to release the stretch. Repeat 2 times. Complete this stretch  3 times per week.   This information is not intended to replace advice given to you by your health care provider. Make sure you discuss any questions you have with your health care provider.  Document Released: 06/23/2005 Document Revised: 08/30/2014 Document Reviewed: 11/21/2008 Elsevier Interactive Patient Education [employer].

## 2017-09-23 ENCOUNTER — Ambulatory Visit (INDEPENDENT_AMBULATORY_CARE_PROVIDER_SITE_OTHER): Payer: Medicare Other | Admitting: Family Medicine

## 2017-09-23 ENCOUNTER — Encounter: Payer: Self-pay | Admitting: Family Medicine

## 2017-09-23 VITALS — BP 122/80 | HR 78 | Temp 98.2°F | Ht 66.5 in | Wt 196.5 lb

## 2017-09-23 DIAGNOSIS — J069 Acute upper respiratory infection, unspecified: Secondary | ICD-10-CM | POA: Diagnosis not present

## 2017-09-23 DIAGNOSIS — B9789 Other viral agents as the cause of diseases classified elsewhere: Secondary | ICD-10-CM

## 2017-09-23 NOTE — Progress Notes (Signed)
Pre visit review using our clinic review tool, if applicable. No additional management support is needed unless otherwise documented below in the visit note. 

## 2017-09-23 NOTE — Progress Notes (Signed)
Chief Complaint  Patient presents with  . URI    Renee [name] here for URI complaints.  Duration: 5 days  Associated symptoms: sinus congestion, sinus pain, rhinorrhea, itchy watery eyes, ear fullness and cough Denies: ear pain, ear drainage, sore throat, wheezing, shortness of breath, myalgia and fevers/rigors Treatment to date: ASA for HA, Mucinex sinus Sick contacts: No  Getting better overall, wants to make sure nothing is in her lungs.   ROS:  Const: Denies fevers HEENT: As noted in HPI Lungs: No SOB  Past Medical History:  Diagnosis Date  . Abdominal pain 07/05/2017  . ABNORMAL INVOLUNTARY MOVEMENTS 01/01/2009   Qualifier: Diagnosis of  By: Linna Darner MD, Gwyndolyn Saxon    . Allergic rhinitis   . Allergic rhinitis 09/19/2008   Qualifier: Diagnosis of  By: Wynona Luna   . Cervical cancer screening 05/08/2013   Menarche 11, regular Menopause 60s G2P2 s/p 2 svd No abnormal paps or MGM Fatty cyst on labia removed.   . Cervical pain (neck) 10/07/2015  . CHEST PAIN 09/19/2008   Qualifier: Diagnosis of  By: Wynona Luna   . 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 Concussion 1969  . Diverticulosis 05/06/2017  . Essential hypertension 09/19/2008   Qualifier: Diagnosis of  By: Wynona Luna   . GERD (gastroesophageal reflux disease)   . H/O osteopenia 02/08/2016  . H/O tobacco use, presenting hazards to health 09/19/2008   Qualifier: Diagnosis of  By: Wynona Luna Smoking roughly 5 cig daily  Last cigarette was in December of 2016  . Head tremor 12/27/2016  . 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 Hyperglycemia 10/07/2016  . Hyperlipidemia   . Hyperlipidemia, mixed 10/03/2008   Qualifier: Diagnosis of  By: Wynona Luna   . Hypertension   . 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 Obesity 07/06/2016  . Occult blood positive stool   . Polyp  of sigmoid colon 12/08/2016   11/2016 - Unable to remove or get bx at colonoscopy. Fixed sigmoid colon prevents. Referred for surgery.  Marland Kitchen PONV (postoperative nausea and vomiting)    ,   . Preventative health care 05/12/2013  . 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 Tobacco abuse   . Tremor, essential   . TREMOR, ESSENTIAL 09/19/2008   Qualifier: Diagnosis of  By: Wynona Luna    BP 122/80 (BP Location: Left Arm, Patient Position: Sitting, Cuff Size: Normal)   Pulse 78   Temp 98.2 F (36.8 C) (Oral)   Ht 5' 6.5" (1.689 m)   Wt 196 lb 8 oz (89.1 kg)   SpO2 96%   BMI 31.24 kg/m  General: Awake, alert, appears stated age HEENT: AT, [place], ears patent b/l and TM's neg, nares patent w/o discharge, pharynx pink and without exudates, MMM Neck: No masses or asymmetry Heart: RRR Lungs: CTAB, no accessory muscle use Psych: Age appropriate judgment and insight, normal mood and affect  Viral URI with cough  Tylenol for headaches.  Continue to push fluids, practice good hand hygiene, cover mouth when coughing. F/u prn. If starting to experience fevers, shaking, or shortness of breath, seek immediate care. Pt voiced understanding and agreement to the plan.  [place], DO 09/23/17 10:04 AM

## 2017-09-23 NOTE — Patient Instructions (Signed)
Continue to push fluids, practice good hand hygiene, and cover your mouth if you cough.  If you start having fevers, shaking or shortness of breath, seek immediate care.  I don't think this is bacterial. Thus, antibiotics would not be helpful, and could even be harmful.   Let us know if you need anything.

## 2017-11-17 ENCOUNTER — Encounter: Payer: Self-pay | Admitting: Family Medicine

## 2017-11-17 ENCOUNTER — Ambulatory Visit (INDEPENDENT_AMBULATORY_CARE_PROVIDER_SITE_OTHER): Payer: Medicare Other | Admitting: Family Medicine

## 2017-11-17 VITALS — BP 132/84 | HR 76 | Temp 98.6°F | Ht 66.5 in | Wt 201.0 lb

## 2017-11-17 DIAGNOSIS — R0781 Pleurodynia: Secondary | ICD-10-CM | POA: Diagnosis not present

## 2017-11-17 HISTORY — DX: Pleurodynia: R07.81

## 2017-11-17 MED ORDER — TRAMADOL HCL 50 MG PO TABS: 50.0000 mg | ORAL_TABLET | Freq: Three times a day (TID) | ORAL | 0 refills | Status: DC | PRN

## 2017-11-17 NOTE — Progress Notes (Signed)
Musculoskeletal Exam  Patient: Renee [name] DOB: 1945/09/28  DOS: 11/17/2017  SUBJECTIVE:  Chief Complaint:   Chief Complaint  Patient presents with  . fell 2 weeks ago.  After exercising has increased her rib pa    Renee [name] is a 72 y.o.  female for evaluation and treatment of L rib pain.   Onset:  2 weeks ago. Fell 2 weeks ago, was improving but then exercised yesterday.  Location: R rib cage Character:  aching and sharp  Progression of issue:  Was getting better, worsened yesterday Associated symptoms: pain with very deep breathing, otherwise no sob Treatment: to date has been rest, binder, and OTC NSAIDS.   Neurovascular symptoms: no   ROS: Musculoskeletal/Extremities: +L rib pain  Past Medical History:  Diagnosis Date  . Abdominal pain 07/05/2017  . ABNORMAL INVOLUNTARY MOVEMENTS 01/01/2009   Qualifier: Diagnosis of  By: Linna Darner MD, Gwyndolyn Saxon    . Allergic rhinitis   . Allergic rhinitis 09/19/2008   Qualifier: Diagnosis of  By: Wynona Luna   . Cervical cancer screening 05/08/2013   Menarche 11, regular Menopause 31s G2P2 s/p 2 svd No abnormal paps or MGM Fatty cyst on labia removed.   . Cervical pain (neck) 10/07/2015  . CHEST PAIN 09/19/2008   Qualifier: Diagnosis of  By: Wynona Luna   . 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 Concussion 1969  . Diverticulosis 05/06/2017  . Essential hypertension 09/19/2008   Qualifier: Diagnosis of  By: Wynona Luna   . GERD (gastroesophageal reflux disease)   . H/O osteopenia 02/08/2016  . H/O tobacco use, presenting hazards to health 09/19/2008   Qualifier: Diagnosis of  By: Wynona Luna Smoking roughly 5 cig daily  Last cigarette was in December of 2016  . Head tremor 12/27/2016  . 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 Hyperglycemia 10/07/2016  . Hyperlipidemia   . Hyperlipidemia, mixed 10/03/2008   Qualifier: Diagnosis of  By: Wynona Luna   . Hypertension   . 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 Obesity 07/06/2016  . Occult blood positive stool   . Polyp of sigmoid colon 12/08/2016   11/2016 - Unable to remove or get bx at colonoscopy. Fixed sigmoid colon prevents. Referred for surgery.  Marland Kitchen PONV (postoperative nausea and vomiting)    ,   . Preventative health care 05/12/2013  . 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 Tobacco abuse   . Tremor, essential   . TREMOR, ESSENTIAL 09/19/2008   Qualifier: Diagnosis of  By: Wynona Luna     Objective: VITAL SIGNS: BP 132/84 (BP Location: Left Arm, Patient Position: Sitting, Cuff Size: Normal)   Pulse 76   Temp 98.6 F (37 C) (Oral)   Ht 5' 6.5" (1.689 m)   Wt 201 lb (91.2 kg)   SpO2 97%   BMI 31.96 kg/m  Constitutional: Well formed, well developed. No acute distress. Cardiovascular: Brisk cap refill Thorax & Lungs: No accessory muscle use Musculoskeletal: L chest wall.   Tenderness to palpation: Yes, over L chest wall just posterior to ant axillary line, approx R 8-9 Deformity: no Ecchymosis: no No crepitus, significant edema Neurologic: Normal sensory function.  Psychiatric: Normal mood. Age appropriate judgment and insight. Alert &  oriented x 3.    Assessment:  Rib pain on left side - Plan: traMADol (ULTRAM) 50 MG tablet  Plan: Orders as above. For breakthrough, tramadol. Tylenol, ice. Cont binder.  F/u prn. The patient voiced understanding and agreement to the plan.   [place], DO 11/17/17  11:32 AM

## 2017-11-17 NOTE — Patient Instructions (Signed)
Ice/cold pack over area for 10-15 min twice daily.  OK to take Tylenol 1000 mg (2 extra strength tabs) or 975 mg (3 regular strength tabs) every 6 hours as needed.  Ibuprofen 400-600 mg (2-3 over the counter strength tabs) every 6 hours as needed for pain.  Let us know if you need anything. 

## 2017-11-17 NOTE — Progress Notes (Signed)
Pre visit review using our clinic review tool, if applicable. No additional management support is needed unless otherwise documented below in the visit note. 

## 2017-11-21 ENCOUNTER — Encounter: Payer: Self-pay | Admitting: Internal Medicine

## 2017-12-06 ENCOUNTER — Ambulatory Visit (INDEPENDENT_AMBULATORY_CARE_PROVIDER_SITE_OTHER): Payer: Medicare Other | Admitting: Family Medicine

## 2017-12-06 DIAGNOSIS — E6609 Other obesity due to excess calories: Secondary | ICD-10-CM | POA: Diagnosis not present

## 2017-12-06 DIAGNOSIS — R0781 Pleurodynia: Secondary | ICD-10-CM

## 2017-12-06 DIAGNOSIS — E782 Mixed hyperlipidemia: Secondary | ICD-10-CM | POA: Diagnosis not present

## 2017-12-06 DIAGNOSIS — H9319 Tinnitus, unspecified ear: Secondary | ICD-10-CM

## 2017-12-06 DIAGNOSIS — R739 Hyperglycemia, unspecified: Secondary | ICD-10-CM | POA: Diagnosis not present

## 2017-12-06 DIAGNOSIS — I10 Essential (primary) hypertension: Secondary | ICD-10-CM

## 2017-12-06 DIAGNOSIS — R1013 Epigastric pain: Secondary | ICD-10-CM

## 2017-12-06 LAB — LIPID PANEL
CHOL/HDL RATIO: 3
Cholesterol: 233 mg/dL — ABNORMAL HIGH (ref 0–200)
HDL: 80.8 mg/dL (ref >39.00)
LDL CALC: 135 mg/dL — AB (ref 0–99)
NonHDL: 152.35
TRIGLYCERIDES: 87 mg/dL (ref 0.0–149.0)
VLDL: 17.4 mg/dL (ref 0.0–40.0)

## 2017-12-06 LAB — CBC
HCT: 45.9 % (ref 36.0–46.0)
HEMOGLOBIN: 15.3 g/dL — AB (ref 12.0–15.0)
MCHC: 33.4 g/dL (ref 30.0–36.0)
MCV: 102.5 fl — AB (ref 78.0–100.0)
PLATELETS: 245 10*3/uL (ref 150.0–400.0)
RBC: 4.48 Mil/uL (ref 3.87–5.11)
RDW: 15 % (ref 11.5–15.5)
WBC: 6.7 10*3/uL (ref 4.0–10.5)

## 2017-12-06 LAB — COMPREHENSIVE METABOLIC PANEL
ALT: 21 U/L (ref 0–35)
AST: 19 U/L (ref 0–37)
Albumin: 4.2 g/dL (ref 3.5–5.2)
Alkaline Phosphatase: 90 U/L (ref 39–117)
BUN: 14 mg/dL (ref 6–23)
CALCIUM: 9.6 mg/dL (ref 8.4–10.5)
CHLORIDE: 104 meq/L (ref 96–112)
CO2: 28 mEq/L (ref 19–32)
Creatinine, Ser: 0.72 mg/dL (ref 0.40–1.20)
GFR: 84.66 mL/min (ref >60.00)
Glucose, Bld: 109 mg/dL — ABNORMAL HIGH (ref 70–99)
POTASSIUM: 5.6 meq/L — AB (ref 3.5–5.1)
Sodium: 140 mEq/L (ref 135–145)
Total Bilirubin: 0.7 mg/dL (ref 0.2–1.2)
Total Protein: 7.2 g/dL (ref 6.0–8.3)

## 2017-12-06 LAB — HEMOGLOBIN A1C: Hgb A1c MFr Bld: 5.8 % (ref 4.6–6.5)

## 2017-12-06 LAB — TSH: TSH: 1.19 u[IU]/mL (ref 0.35–4.50)

## 2017-12-06 MED ORDER — METOPROLOL SUCCINATE ER 50 MG PO TB24: 50.0000 mg | ORAL_TABLET | Freq: Every day | ORAL | 3 refills | Status: DC

## 2017-12-06 NOTE — Assessment & Plan Note (Signed)
Tolerating statin, encouraged heart healthy diet, avoid trans fats, minimize simple carbs and saturated fats. Increase exercise as tolerated. Still only taking it twice a week.

## 2017-12-06 NOTE — Progress Notes (Signed)
Subjective:  I acted as a Education administrator for Dr. Charlett Blake. [name], [place]  Patient ID: Renee [name], female    DOB: February 26, 1946, 72 y.o.   MRN: [id number]  No chief complaint on file.   HPI  Patient is in today for a follow up.she is noting a fall awhile ago and some persistent rib pain. It is improving but slowly. Notes an improvement of tinnitus but notes some sense of congestion and pressure in right ear. No discharge or pain. No fevers or chills. Hip pain has begun to improve. Notes some persistent abdominal distention but no pain or change in bowel habits. Denies CP/palp/SOB/HA/congestion/fevers/GI or GU c/o. Taking meds as prescribed  Patient Care Team: [name], MD as PCP - General (Family Medicine) Melissa Montane, MD as Consulting Physician (Otolaryngology) Gatha Mayer, MD as Consulting Physician (Gastroenterology) Dr Shanon Brow Daughtry (Optometry) Michael Boston, MD as Consulting Physician (General Surgery) Love, Alyson Locket, MD as Consulting Physician (Neurology) Bernell List, CPhT as [facility] (Pharmacy Technician)   Past Medical History:  Diagnosis Date  . Abdominal pain 07/05/2017  . ABNORMAL INVOLUNTARY MOVEMENTS 01/01/2009   Qualifier: Diagnosis of  By: Linna Darner MD, Gwyndolyn Saxon    . Allergic rhinitis   . Allergic rhinitis 09/19/2008   Qualifier: Diagnosis of  By: Wynona Luna   . Cervical cancer screening 05/08/2013   Menarche 11, regular Menopause 82s G2P2 s/p 2 svd No abnormal paps or MGM Fatty cyst on labia removed.   . Cervical pain (neck) 10/07/2015  . CHEST PAIN 09/19/2008   Qualifier: Diagnosis of  By: Wynona Luna   . 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 Concussion 1969  . Diverticulosis 05/06/2017  . Essential hypertension 09/19/2008   Qualifier: Diagnosis of  By: Wynona Luna   . GERD (gastroesophageal reflux disease)   . H/O osteopenia 02/08/2016  . H/O tobacco use,  presenting hazards to health 09/19/2008   Qualifier: Diagnosis of  By: Wynona Luna Smoking roughly 5 cig daily  Last cigarette was in December of 2016  . Head tremor 12/27/2016  . 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 Hyperglycemia 10/07/2016  . Hyperlipidemia   . Hyperlipidemia, mixed 10/03/2008   Qualifier: Diagnosis of  By: Wynona Luna   . Hypertension   . 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 Obesity 07/06/2016  . Occult blood positive stool   . Polyp of sigmoid colon 12/08/2016   11/2016 - Unable to remove or get bx at colonoscopy. Fixed sigmoid colon prevents. Referred for surgery.  Marland Kitchen PONV (postoperative nausea and vomiting)    ,   . Preventative health care 05/12/2013  . 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 Tobacco abuse   . Tremor, essential   . TREMOR, ESSENTIAL 09/19/2008   Qualifier: Diagnosis of  By: Wynona Luna     Past Surgical History:  Procedure Laterality Date  . INNER EAR SURGERY  2015   with tube  . MOUTH SURGERY  01/22/11   lower denture secured by titanium bolts  . rectal suction biospy    . XI ROBOTIC ASSISTED LOWER ANTERIOR RESECTION N/A 02/09/2017   Procedure: XI ROBOTIC RESECTION OF SIGMOID COLON, RIGID PROCTOSCOPY;  Surgeon: Michael Boston,  MD;  Location: [facility];  Service: General;  Laterality: N/A;    Family History  Problem Relation Age of Onset  . Heart attack Father 42       deceased  . Stroke Mother   . Hypertension Mother   . Alcohol abuse Mother   . Obesity Daughter   . Heart disease Paternal Grandmother        AAA rupture  . Heart disease Paternal Grandfather        MI  . Colon cancer Neg Hx   . Breast cancer Neg Hx   . Prostate cancer Neg Hx   . Diabetes Neg Hx     Social History   Socioeconomic History  . Marital status: Married    Spouse name: Not on file  . Number of children: Not on file  . Years of  education: Not on file  . Highest education level: Not on file  Occupational History  . Not on file  Social Needs  . Financial resource strain: Not on file  . Food insecurity:    Worry: Not on file    Inability: Not on file  . Transportation needs:    Medical: Not on file    Non-medical: Not on file  Tobacco Use  . Smoking status: Former Smoker    Packs/day: 0.50    Types: Cigarettes    Last attempt to quit: 09/07/2015    Years since quitting: 2.2  . Smokeless tobacco: Never Used  Substance and Sexual Activity  . Alcohol use: Yes    Alcohol/week: 8.4 oz    Types: 14 Glasses of wine per week    Comment: 3 glasses of wine daily   . Drug use: No  . Sexual activity: Not Currently  Lifestyle  . Physical activity:    Days per week: Not on file    Minutes per session: Not on file  . Stress: Not on file  Relationships  . Social connections:    Talks on phone: Not on file    Gets together: Not on file    Attends religious service: Not on file    Active member of club or organization: Not on file    Attends meetings of clubs or organizations: Not on file    Relationship status: Not on file  . Intimate partner violence:    Fear of current or ex partner: Not on file    Emotionally abused: Not on file    Physically abused: Not on file    Forced sexual activity: Not on file  Other Topics Concern  . Not on file  Social History Narrative  . Not on file    Outpatient Medications Prior to Visit  Medication Sig Dispense Refill  . atorvastatin (LIPITOR) 10 MG tablet Take 0.5 tablets (5 mg total) by mouth every other day. 15 tablet 5  . b complex vitamins tablet Take 1 tablet by mouth every other day.    . Cholecalciferol (VITAMIN D PO) Take 1 tablet by mouth every other day. 3 times per week    . Coenzyme Q10 200 MG capsule Take 200 mg by mouth every other day. 3 times per week    . Multiple Vitamin (MULTIVITAMIN WITH MINERALS) TABS tablet Take 1 tablet by mouth every other day.      . Probiotic Product (PROBIOTIC-10 PO) Take 1 capsule by mouth. Every Tuesday, Thursday, and Saturday in the am    . ranitidine (ZANTAC) 300 MG capsule Take 1 capsule (300 mg  total) as needed by mouth for heartburn. 30 capsule 3  . traMADol (ULTRAM) 50 MG tablet Take 1 tablet (50 mg total) by mouth every 8 (eight) hours as needed for moderate pain. 12 tablet 0  . metoprolol succinate (TOPROL-XL) 25 MG 24 hr tablet TAKE 1 TABLET BY MOUTH EVERY DAY 30 tablet 2  . metoprolol succinate (TOPROL-XL) 25 MG 24 hr tablet Take 0.5 tablets (12.5 mg total) daily by mouth. 30 tablet 2   No facility-administered medications prior to visit.     Allergies  Allergen Reactions  . Amoxicillin     REACTION: TROUBLE BREATHING Has patient had a PCN reaction causing immediate rash, facial/tongue/throat swelling, SOB or lightheadedness with hypotension:Yes Has patient had a PCN reaction causing severe rash involving mucus membranes or skin necrosis:No Has patient had a PCN reaction that required hospitalization:No Has patient had a PCN reaction occurring within the last 10 years:No If all of the above answers are "NO", then may proceed with Cephalosporin use.   Abe People Hfa [Albuterol] Other (See Comments)    Hypersensitivity, anxious, disoriented  . Penicillins Rash    Has patient had a PCN reaction causing immediate rash, facial/tongue/throat swelling, SOB or lightheadedness with hypotension:unknown Has patient had a PCN reaction causing severe rash involving mucus membranes or skin necrosis:unknown Has patient had a PCN reaction that required hospitalization:no Has patient had a PCN reaction occurring within the last 10 years:no If all of the above answers are "NO", then may proceed with Cephalosporin use.     Review of Systems  Constitutional: Negative for fever and malaise/fatigue.  HENT: Positive for congestion and hearing loss. Negative for ear discharge, ear pain and tinnitus.   Eyes: Negative for  blurred vision.  Respiratory: Negative for shortness of breath.   Cardiovascular: Negative for chest pain, palpitations and leg swelling.  Gastrointestinal: Negative for abdominal pain, blood in stool and nausea.  Genitourinary: Negative for dysuria and frequency.  Musculoskeletal: Positive for joint pain. Negative for falls.  Skin: Negative for rash.  Neurological: Positive for dizziness. Negative for loss of consciousness and headaches.  Endo/Heme/Allergies: Negative for environmental allergies.  Psychiatric/Behavioral: Negative for depression. The patient is not nervous/anxious.        Objective:    Physical Exam  Constitutional: She appears well-developed and well-nourished. No distress.  HENT:  Head: Normocephalic and atraumatic.  Right Ear: External ear normal.  Left Ear: External ear normal.  Nose: Nose normal.  Mouth/Throat: Oropharynx is clear and moist.  Cerumen noted in canals not obstructing   Eyes: Pupils are equal, round, and reactive to light. Conjunctivae and EOM are normal. Right eye exhibits no discharge. Left eye exhibits no discharge.  Neck: Normal range of motion. Neck supple. No JVD present. No thyromegaly present.  Cardiovascular: Normal rate, regular rhythm, normal heart sounds and intact distal pulses.  No murmur heard. Pulmonary/Chest: Effort normal and breath sounds normal. No respiratory distress. She has no wheezes. She has no rales. She exhibits no tenderness.  Abdominal: Soft. Bowel sounds are normal. She exhibits no distension and no mass. There is no tenderness. There is no rebound and no guarding.  Genitourinary: Vagina normal and uterus normal. Rectal exam shows guaiac negative stool. No vaginal discharge found.  Musculoskeletal: Normal range of motion. She exhibits no edema or tenderness.  Lymphadenopathy:    She has no cervical adenopathy.  Neurological: She is alert. She has normal reflexes. No cranial nerve deficit.  Skin: Skin is warm and  dry. No rash  noted. She is not diaphoretic. No erythema.  Psychiatric: She has a normal mood and affect. Her behavior is normal. Judgment and thought content normal.  Nursing note and vitals reviewed.   BP (!) 148/90 (BP Location: Left Arm, Patient Position: Sitting, Cuff Size: Normal)   Pulse 73   Temp 98.1 F (36.7 C) (Oral)   Resp 18   Wt 199 lb (90.3 kg)   SpO2 97%   BMI 31.64 kg/m  Wt Readings from Last 3 Encounters:  12/06/17 199 lb (90.3 kg)  11/17/17 201 lb (91.2 kg)  09/23/17 196 lb 8 oz (89.1 kg)   BP Readings from Last 3 Encounters:  12/06/17 (!) 148/90  11/17/17 132/84  09/23/17 122/80     Immunization History  Administered Date(s) Administered  . Pneumococcal Conjugate-13 07/06/2016    Health Maintenance  Topic Date Due  . Hepatitis C Screening  03/21/1946  . TETANUS/TDAP  01/30/1965  . PNA vac Low Risk Adult (2 of 2 - PPSV23) 07/06/2017  . COLONOSCOPY  12/01/2017  . INFLUENZA VACCINE  05/11/2018 (Originally 03/23/2018)  . MAMMOGRAM  09/02/2018  . DEXA SCAN  Completed    Lab Results  Component Value Date   WBC 6.7 12/06/2017   HGB 15.3 (H) 12/06/2017   HCT 45.9 12/06/2017   PLT 245.0 12/06/2017   GLUCOSE 106 (H) 12/08/2017   CHOL 233 (H) 12/06/2017   TRIG 87.0 12/06/2017   HDL 80.80 12/06/2017   LDLDIRECT 148.6 09/26/2008   LDLCALC 135 (H) 12/06/2017   ALT 21 12/08/2017   AST 19 12/08/2017   NA 137 12/08/2017   K 4.0 12/08/2017   CL 103 12/08/2017   CREATININE 0.69 12/08/2017   BUN 12 12/08/2017   CO2 26 12/08/2017   TSH 1.19 12/06/2017   HGBA1C 5.8 12/06/2017    Lab Results  Component Value Date   TSH 1.19 12/06/2017   Lab Results  Component Value Date   WBC 6.7 12/06/2017   HGB 15.3 (H) 12/06/2017   HCT 45.9 12/06/2017   MCV 102.5 (H) 12/06/2017   PLT 245.0 12/06/2017   Lab Results  Component Value Date   NA 137 12/08/2017   K 4.0 12/08/2017   CO2 26 12/08/2017   GLUCOSE 106 (H) 12/08/2017   BUN 12 12/08/2017   CREATININE  0.69 12/08/2017   BILITOT 0.8 12/08/2017   ALKPHOS 82 12/08/2017   AST 19 12/08/2017   ALT 21 12/08/2017   PROT 6.6 12/08/2017   ALBUMIN 4.0 12/08/2017   CALCIUM 8.8 12/08/2017   ANIONGAP 6 02/10/2017   GFR 88.93 12/08/2017   Lab Results  Component Value Date   CHOL 233 (H) 12/06/2017   Lab Results  Component Value Date   HDL 80.80 12/06/2017   Lab Results  Component Value Date   LDLCALC 135 (H) 12/06/2017   Lab Results  Component Value Date   TRIG 87.0 12/06/2017   Lab Results  Component Value Date   CHOLHDL 3 12/06/2017   Lab Results  Component Value Date   HGBA1C 5.8 12/06/2017         Assessment & Plan:   Problem List Items Addressed This Visit    Hyperlipidemia, mixed    Tolerating statin, encouraged heart healthy diet, avoid trans fats, minimize simple carbs and saturated fats. Increase exercise as tolerated. Still only taking it twice a week.       Relevant Medications   metoprolol succinate (TOPROL-XL) 50 MG 24 hr tablet   Other Relevant Orders  Lipid panel (Completed)   Essential hypertension    Well controlled, no changes to meds. Encouraged heart healthy diet such as the DASH diet and exercise as tolerated.       Relevant Medications   metoprolol succinate (TOPROL-XL) 50 MG 24 hr tablet   Other Relevant Orders   CBC (Completed)   Comprehensive metabolic panel (Completed)   TSH (Completed)   Obesity    Encouraged DASH diet, decrease po intake and increase exercise as tolerated. Needs 7-8 hours of sleep nightly. Avoid trans fats, eat small, frequent meals every 4-5 hours with lean proteins, complex carbs and healthy fats. Minimize simple carbs. Bariatric referral      Hyperglycemia    hgba1c acceptable, minimize simple carbs. Increase exercise as tolerated.       Relevant Orders   Hemoglobin A1c (Completed)   Abdominal pain    Improving some is following with gastroenterology, no changes      Rib pain on left side    Had a fall and  contues to have some pain although it is improving slowly. Encouraged topical ice and Lidocaine gel      Tinnitus    Improving but does note some sense of feeling plugged in right ear. No pain or discharge, no headache. May need referral to ENT if symptoms persist.         I have discontinued Beya L. Kittelson's metoprolol succinate and metoprolol succinate. I am also having her start on metoprolol succinate. Additionally, I am having her maintain her b complex vitamins, Cholecalciferol (VITAMIN D PO), Probiotic Product (PROBIOTIC-10 PO), multivitamin with minerals, Coenzyme Q10, ranitidine, atorvastatin, and traMADol.  Meds ordered this encounter  Medications  . metoprolol succinate (TOPROL-XL) 50 MG 24 hr tablet    Sig: Take 1 tablet (50 mg total) by mouth daily. Take with or immediately following a meal.    Dispense:  30 tablet    Refill:  3    CMA served as scribe during this visit. History, Physical and Plan performed by medical provider. Documentation and orders reviewed and attested to.  Penni Homans, MD

## 2017-12-06 NOTE — Patient Instructions (Addendum)
Lidocaine gel by [employer], [employer] or [employer] companies  MIND diet  64 oz of water or clear daily, extra water if drinking alcohol and caffeine DASH Eating Plan DASH stands for "Dietary Approaches to Stop Hypertension." The DASH eating plan is a healthy eating plan that has been shown to reduce high blood pressure (hypertension). It may also reduce your risk for type 2 diabetes, heart disease, and stroke. The DASH eating plan may also help with weight loss. What are tips for following this plan? General guidelines  Avoid eating more than 2,300 mg (milligrams) of salt (sodium) a day. If you have hypertension, you may need to reduce your sodium intake to 1,500 mg a day.  Limit alcohol intake to no more than 1 drink a day for nonpregnant women and 2 drinks a day for men. One drink equals 12 oz of beer, 5 oz of wine, or 1 oz of hard liquor.  Work with your health care provider to maintain a healthy body weight or to lose weight. Ask what an ideal weight is for you.  Get at least 30 minutes of exercise that causes your heart to beat faster (aerobic exercise) most days of the week. Activities may include walking, swimming, or biking.  Work with your health care provider or diet and nutrition specialist (dietitian) to adjust your eating plan to your individual calorie needs. Reading food labels  Check food labels for the amount of sodium per serving. Choose foods with less than 5 percent of the Daily Value of sodium. Generally, foods with less than 300 mg of sodium per serving fit into this eating plan.  To find whole grains, look for the word "whole" as the first word in the ingredient list. Shopping  Buy products labeled as "low-sodium" or "no salt added."  Buy fresh foods. Avoid canned foods and premade or frozen meals. Cooking  Avoid adding salt when cooking. Use salt-free seasonings or herbs instead of table salt or sea salt. Check with your health care provider or pharmacist before  using salt substitutes.  Do not fry foods. Cook foods using healthy methods such as baking, boiling, grilling, and broiling instead.  Cook with heart-healthy oils, such as olive, canola, soybean, or sunflower oil. Meal planning   Eat a balanced diet that includes: ? 5 or more servings of fruits and vegetables each day. At each meal, try to fill half of your plate with fruits and vegetables. ? Up to 6-8 servings of whole grains each day. ? Less than 6 oz of lean meat, poultry, or fish each day. A 3-oz serving of meat is about the same size as a deck of cards. One egg equals 1 oz. ? 2 servings of low-fat dairy each day. ? A serving of nuts, seeds, or beans 5 times each week. ? Heart-healthy fats. Healthy fats called Omega-3 fatty acids are found in foods such as flaxseeds and coldwater fish, like sardines, salmon, and mackerel.  Limit how much you eat of the following: ? Canned or prepackaged foods. ? Food that is high in trans fat, such as fried foods. ? Food that is high in saturated fat, such as fatty meat. ? Sweets, desserts, sugary drinks, and other foods with added sugar. ? Full-fat dairy products.  Do not salt foods before eating.  Try to eat at least 2 vegetarian meals each week.  Eat more home-cooked food and less restaurant, buffet, and fast food.  When eating at a restaurant, ask that your food be  prepared with less salt or no salt, if possible. What foods are recommended? The items listed may not be a complete list. Talk with your dietitian about what dietary choices are best for you. Grains Whole-grain or whole-wheat bread. Whole-grain or whole-wheat pasta. Brown rice. [place]. Bulgur. Whole-grain and low-sodium cereals. Pita bread. Low-fat, low-sodium crackers. Whole-wheat flour tortillas. Vegetables Fresh or frozen vegetables (raw, steamed, roasted, or grilled). Low-sodium or reduced-sodium tomato and vegetable juice. Low-sodium or reduced-sodium tomato sauce  and tomato paste. Low-sodium or reduced-sodium canned vegetables. Fruits All fresh, dried, or frozen fruit. Canned fruit in natural juice (without added sugar). Meat and other protein foods Skinless chicken or [place]. Ground chicken or [place]. Pork with fat trimmed off. Fish and seafood. Egg whites. Dried beans, peas, or lentils. Unsalted nuts, nut butters, and seeds. Unsalted canned beans. Lean cuts of beef with fat trimmed off. Low-sodium, lean deli meat. Dairy Low-fat (1%) or fat-free (skim) milk. Fat-free, low-fat, or reduced-fat cheeses. Nonfat, low-sodium ricotta or cottage cheese. Low-fat or nonfat yogurt. Low-fat, low-sodium cheese. Fats and oils Soft margarine without trans fats. Vegetable oil. Low-fat, reduced-fat, or light mayonnaise and salad dressings (reduced-sodium). Canola, safflower, olive, soybean, and sunflower oils. Avocado. Seasoning and other foods Herbs. Spices. Seasoning mixes without salt. Unsalted popcorn and pretzels. Fat-free sweets. What foods are not recommended? The items listed may not be a complete list. Talk with your dietitian about what dietary choices are best for you. Grains Baked goods made with fat, such as croissants, muffins, or some breads. Dry pasta or rice meal packs. Vegetables Creamed or fried vegetables. Vegetables in a cheese sauce. Regular canned vegetables (not low-sodium or reduced-sodium). Regular canned tomato sauce and paste (not low-sodium or reduced-sodium). Regular tomato and vegetable juice (not low-sodium or reduced-sodium). Angie Fava. Olives. Fruits Canned fruit in a light or heavy syrup. Fried fruit. Fruit in cream or butter sauce. Meat and other protein foods Fatty cuts of meat. Ribs. Fried meat. Berniece Salines. Sausage. Bologna and other processed lunch meats. Salami. Fatback. Hotdogs. Bratwurst. Salted nuts and seeds. Canned beans with added salt. Canned or smoked fish. Whole eggs or egg yolks. Chicken or [place] with skin. Dairy Whole or 2%  milk, cream, and half-and-half. Whole or full-fat cream cheese. Whole-fat or sweetened yogurt. Full-fat cheese. Nondairy creamers. Whipped toppings. Processed cheese and cheese spreads. Fats and oils Butter. Stick margarine. Lard. Shortening. Ghee. Bacon fat. Tropical oils, such as coconut, palm kernel, or palm oil. Seasoning and other foods Salted popcorn and pretzels. Onion salt, garlic salt, seasoned salt, table salt, and sea salt. Worcestershire sauce. Tartar sauce. Barbecue sauce. Teriyaki sauce. Soy sauce, including reduced-sodium. Steak sauce. Canned and packaged gravies. Fish sauce. Oyster sauce. Cocktail sauce. Horseradish that you find on the shelf. Ketchup. Mustard. Meat flavorings and tenderizers. Bouillon cubes. Hot sauce and Tabasco sauce. Premade or packaged marinades. Premade or packaged taco seasonings. Relishes. Regular salad dressings. Where to find more information:  National Heart, Lung, and [place]: https://wilson-eaton.com/  American Heart Association: www.heart.org Summary  The DASH eating plan is a healthy eating plan that has been shown to reduce high blood pressure (hypertension). It may also reduce your risk for type 2 diabetes, heart disease, and stroke.  With the DASH eating plan, you should limit salt (sodium) intake to 2,300 mg a day. If you have hypertension, you may need to reduce your sodium intake to 1,500 mg a day.  When on the DASH eating plan, aim to eat more fresh fruits and vegetables, whole grains,  lean proteins, low-fat dairy, and heart-healthy fats.  Work with your health care provider or diet and nutrition specialist (dietitian) to adjust your eating plan to your individual calorie needs. This information is not intended to replace advice given to you by your health care provider. Make sure you discuss any questions you have with your health care provider. Document Released: 07/29/2011 Document Revised: 08/02/2016 Document Reviewed:  08/02/2016 Elsevier Interactive Patient Education  [employer].

## 2017-12-06 NOTE — Assessment & Plan Note (Signed)
Well controlled, no changes to meds. Encouraged heart healthy diet such as the DASH diet and exercise as tolerated.  °

## 2017-12-06 NOTE — Assessment & Plan Note (Signed)
Encouraged DASH diet, decrease po intake and increase exercise as tolerated. Needs 7-8 hours of sleep nightly. Avoid trans fats, eat small, frequent meals every 4-5 hours with lean proteins, complex carbs and healthy fats. Minimize simple carbs. Bariatric referral

## 2017-12-06 NOTE — Assessment & Plan Note (Signed)
hgba1c acceptable, minimize simple carbs. Increase exercise as tolerated.  

## 2017-12-07 ENCOUNTER — Other Ambulatory Visit: Payer: Self-pay | Admitting: Emergency Medicine

## 2017-12-07 DIAGNOSIS — E875 Hyperkalemia: Secondary | ICD-10-CM

## 2017-12-07 NOTE — Progress Notes (Signed)
omp

## 2017-12-08 ENCOUNTER — Other Ambulatory Visit (INDEPENDENT_AMBULATORY_CARE_PROVIDER_SITE_OTHER): Payer: Medicare Other

## 2017-12-08 ENCOUNTER — Encounter: Payer: Self-pay | Admitting: Internal Medicine

## 2017-12-08 DIAGNOSIS — E875 Hyperkalemia: Secondary | ICD-10-CM

## 2017-12-08 LAB — COMPREHENSIVE METABOLIC PANEL
ALBUMIN: 4 g/dL (ref 3.5–5.2)
ALK PHOS: 82 U/L (ref 39–117)
ALT: 21 U/L (ref 0–35)
AST: 19 U/L (ref 0–37)
BUN: 12 mg/dL (ref 6–23)
CO2: 26 mEq/L (ref 19–32)
CREATININE: 0.69 mg/dL (ref 0.40–1.20)
Calcium: 8.8 mg/dL (ref 8.4–10.5)
Chloride: 103 mEq/L (ref 96–112)
GFR: 88.93 mL/min (ref >60.00)
Glucose, Bld: 106 mg/dL — ABNORMAL HIGH (ref 70–99)
Potassium: 4 mEq/L (ref 3.5–5.1)
SODIUM: 137 meq/L (ref 135–145)
Total Bilirubin: 0.8 mg/dL (ref 0.2–1.2)
Total Protein: 6.6 g/dL (ref 6.0–8.3)

## 2017-12-12 DIAGNOSIS — H9319 Tinnitus, unspecified ear: Secondary | ICD-10-CM

## 2017-12-12 HISTORY — DX: Tinnitus, unspecified ear: H93.19

## 2017-12-12 NOTE — Assessment & Plan Note (Signed)
Had a fall and contues to have some pain although it is improving slowly. Encouraged topical ice and Lidocaine gel

## 2017-12-12 NOTE — Assessment & Plan Note (Signed)
Improving but does note some sense of feeling plugged in right ear. No pain or discharge, no headache. May need referral to ENT if symptoms persist.

## 2017-12-12 NOTE — Assessment & Plan Note (Signed)
Improving some is following with gastroenterology, no changes

## 2018-01-03 ENCOUNTER — Other Ambulatory Visit: Payer: Self-pay

## 2018-01-03 ENCOUNTER — Ambulatory Visit: Payer: Medicare Other

## 2018-01-03 ENCOUNTER — Ambulatory Visit (AMBULATORY_SURGERY_CENTER): Payer: Self-pay

## 2018-01-03 VITALS — Ht 66.5 in | Wt 198.2 lb

## 2018-01-03 DIAGNOSIS — Z86010 Personal history of colonic polyps: Secondary | ICD-10-CM

## 2018-01-03 NOTE — Progress Notes (Signed)
Denies allergies to eggs or soy products. Denies complication of anesthesia or sedation. Denies use of weight loss medication. Denies use of O2.   Emmi instructions declined.  

## 2018-01-04 ENCOUNTER — Ambulatory Visit (INDEPENDENT_AMBULATORY_CARE_PROVIDER_SITE_OTHER): Payer: Medicare Other | Admitting: Family Medicine

## 2018-01-04 VITALS — BP 137/87 | HR 85

## 2018-01-04 DIAGNOSIS — I10 Essential (primary) hypertension: Secondary | ICD-10-CM | POA: Diagnosis not present

## 2018-01-04 NOTE — Patient Instructions (Signed)
Increase metoprolol XL 25mg  to 1 full tablet daily. Follow-up as scheduled with Dr. Charlett Blake.

## 2018-01-04 NOTE — Progress Notes (Signed)
For the sake of ease, take a full tab. Agree. Renee [name] Renee [name] 4:45 PM 01/04/18

## 2018-01-04 NOTE — Progress Notes (Signed)
Pre visit review using our clinic review tool, if applicable. No additional management support is needed unless otherwise documented below in the visit note.  Pt here today for BP check. Pt elevated at last OV w/ Dr. Charlett Blake on 12/06/2017- Pt instructed to continue same meds, DASH diet, plenty of rest, hydration, and return for BP check.   However, Pt has Toprol-XL 50mg  on her list- however Pt states she is supposed to have Toprol-XL 25mg  which she is taking 1/2 tablet of. She does admit she is slightly anxious about upcoming cscope.   BP in L arm: 156/101 Pulse: 85  BP in R arm after 5 minutes: 137/87 Pulse: 85  Per Dr. Nani Ravens- increase Toprol-XL to 25mg  daily. Follow-up as already scheduled w/ Dr. Charlett Blake.  Pt verbalized understanding.

## 2018-01-05 ENCOUNTER — Encounter: Payer: Self-pay | Admitting: Internal Medicine

## 2018-01-13 ENCOUNTER — Telehealth: Payer: Self-pay | Admitting: Internal Medicine

## 2018-01-13 NOTE — Telephone Encounter (Signed)
I left a detailed message that it is ok to keep the procedure and to call back if she has additional questions or concerns.

## 2018-01-17 ENCOUNTER — Ambulatory Visit (AMBULATORY_SURGERY_CENTER): Payer: Medicare Other | Admitting: Internal Medicine

## 2018-01-17 ENCOUNTER — Encounter: Payer: Self-pay | Admitting: Internal Medicine

## 2018-01-17 ENCOUNTER — Other Ambulatory Visit: Payer: Self-pay

## 2018-01-17 VITALS — BP 145/76 | HR 74 | Temp 98.4°F | Resp 10 | Ht 66.0 in | Wt 198.0 lb

## 2018-01-17 DIAGNOSIS — D126 Benign neoplasm of colon, unspecified: Secondary | ICD-10-CM | POA: Diagnosis not present

## 2018-01-17 DIAGNOSIS — K635 Polyp of colon: Secondary | ICD-10-CM | POA: Diagnosis not present

## 2018-01-17 DIAGNOSIS — D123 Benign neoplasm of transverse colon: Secondary | ICD-10-CM

## 2018-01-17 DIAGNOSIS — D125 Benign neoplasm of sigmoid colon: Secondary | ICD-10-CM

## 2018-01-17 DIAGNOSIS — D120 Benign neoplasm of cecum: Secondary | ICD-10-CM | POA: Diagnosis not present

## 2018-01-17 DIAGNOSIS — D124 Benign neoplasm of descending colon: Secondary | ICD-10-CM

## 2018-01-17 DIAGNOSIS — Z86010 Personal history of colonic polyps: Secondary | ICD-10-CM | POA: Diagnosis not present

## 2018-01-17 DIAGNOSIS — R195 Other fecal abnormalities: Secondary | ICD-10-CM | POA: Diagnosis present

## 2018-01-17 DIAGNOSIS — D122 Benign neoplasm of ascending colon: Secondary | ICD-10-CM

## 2018-01-17 MED ORDER — DEXTROSE 5 % IV SOLN: INTRAVENOUS | Status: DC

## 2018-01-17 MED ORDER — SODIUM CHLORIDE 0.9 % IV SOLN: 500.0000 mL | Freq: Once | INTRAVENOUS | Status: DC

## 2018-01-17 NOTE — Op Note (Signed)
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 The patient's tolerance of                            previous anesthesia was also reviewed. The risks                            and benefits of the procedure and the sedation                            options and risks were discussed with the patient.                            All questions were answered, and informed consent                            was obtained. Prior Anticoagulants: The patient has                            taken no previous anticoagulant or antiplatelet                            agents. ASA Grade Assessment: II - A patient with                            mild systemic disease. After reviewing the risks                            and benefits, the patient was deemed in                            satisfactory condition to undergo the procedure.  After obtaining informed consent, the colonoscope                            was passed under direct vision. Throughout the                            procedure, the patient's blood pressure, pulse, and                            oxygen saturations were monitored continuously. The                            Colonoscope was introduced through the anus and                             advanced to the the cecum, identified by                            appendiceal orifice and ileocecal valve. The                            colonoscopy was performed without difficulty. The                            patient tolerated the procedure well. The quality                            of the bowel preparation was good. The ileocecal                            valve, appendiceal orifice, and rectum were                            photographed. The bowel preparation used was                            Miralax. Scope In: 8:17:30 AM Scope Out: 8:39:46 AM Scope Withdrawal Time: 0 hours 19 minutes 41 seconds  Total Procedure Duration: 0 hours 22 minutes 16 seconds  Findings:                 The perianal and digital rectal examinations were                            normal.                           Four sessile polyps were found in the sigmoid                            colon, descending colon, transverse colon and                            ascending colon. The polyps were diminutive in  size. These polyps were removed with a cold snare.                            Resection and retrieval were complete. Verification                            of patient identification for the specimen was                            done. Estimated blood loss was minimal.                           A 1 to 2 mm polyp was found in the cecum. The polyp                            was sessile. The polyp was removed with a cold                            biopsy forceps. Resection and retrieval were                            complete. Verification of patient identification                            for the specimen was done. Estimated blood loss was                            minimal.                           There was evidence of a prior end-to-end                            colo-colonic anastomosis in the sigmoid colon. This                            was patent and was  characterized by healthy                            appearing mucosa. The anastomosis was traversed.                           The exam was otherwise without abnormality on                            direct and retroflexion views. Complications:            No immediate complications. Estimated Blood Loss:     Estimated blood loss was minimal. Impression:               - Four diminutive polyps in the sigmoid colon, in                            the descending colon, in the transverse colon and  in the ascending colon, removed with a cold snare.                            Resected and retrieved.                           - One 1 to 2 mm polyp in the cecum, removed with a                            cold biopsy forceps. Resected and retrieved.                           - Patent end-to-end colo-colonic anastomosis,                            characterized by healthy appearing mucosa.                           - The examination was otherwise normal on direct                            and retroflexion views. Recommendation:           - Patient has a contact number available for                            emergencies. The signs and symptoms of potential                            delayed complications were discussed with the                            patient. Return to normal activities tomorrow.                            Written discharge instructions were provided to the                            patient.                           - Resume previous diet.                           - Continue present medications.                           - Repeat colonoscopy is recommended. The                            colonoscopy date will be determined after pathology                            results from today's exam become available for  review. Gatha Mayer, MD 01/17/2018 8:52:20 AM This report has been signed electronically.

## 2018-01-17 NOTE — Patient Instructions (Addendum)
   I found and removed 5 tiny polyps. No signs of cancer.  I will let you know pathology results and when to have another routine colonoscopy by mail and/or My Chart.  I appreciate the opportunity to care for you. Gatha Mayer, MD, [facility]   Handout given for polyps.  YOU HAD AN ENDOSCOPIC PROCEDURE TODAY AT [facility]:   Refer to the procedure report that was given to you for any specific questions about what was found during the examination.  If the procedure report does not answer your questions, please call your gastroenterologist to clarify.  If you requested that your care partner not be given the details of your procedure findings, then the procedure report has been included in a sealed envelope for you to review at your convenience later.  YOU SHOULD EXPECT: Some feelings of bloating in the abdomen. Passage of more gas than usual.  Walking can help get rid of the air that was put into your GI tract during the procedure and reduce the bloating. If you had a lower endoscopy (such as a colonoscopy or flexible sigmoidoscopy) you may notice spotting of blood in your stool or on the toilet paper. If you underwent a bowel prep for your procedure, you may not have a normal bowel movement for a few days.  Please Note:  You might notice some irritation and congestion in your nose or some drainage.  This is from the oxygen used during your procedure.  There is no need for concern and it should clear up in a day or so.  SYMPTOMS TO REPORT IMMEDIATELY:   Following lower endoscopy (colonoscopy or flexible sigmoidoscopy):  Excessive amounts of blood in the stool  Significant tenderness or worsening of abdominal pains  Swelling of the abdomen that is new, acute  Fever of 100F or higher   For urgent or emergent issues, a gastroenterologist can be reached at any hour by calling [phone].   DIET:  We do recommend a small meal at first, but then you may proceed to your  regular diet.  Drink plenty of fluids but you should avoid alcoholic beverages for 24 hours.  ACTIVITY:  You should plan to take it easy for the rest of today and you should NOT DRIVE or use heavy machinery until tomorrow (because of the sedation medicines used during the test).    FOLLOW UP: Our staff will call the number listed on your records the next business day following your procedure to check on you and address any questions or concerns that you may have regarding the information given to you following your procedure. If we do not reach you, we will leave a message.  However, if you are feeling well and you are not experiencing any problems, there is no need to return our call.  We will assume that you have returned to your regular daily activities without incident.  If any biopsies were taken you will be contacted by phone or by letter within the next 1-3 weeks.  Please call us at [phone] if you have not heard about the biopsies in 3 weeks.    SIGNATURES/CONFIDENTIALITY: You and/or your care partner have signed paperwork which will be entered into your electronic medical record.  These signatures attest to the fact that that the information above on your After Visit Summary has been reviewed and is understood.  Full responsibility of the confidentiality of this discharge information lies with you and/or your care-partner.

## 2018-01-17 NOTE — Progress Notes (Signed)
To recovery, report to RN, VSS. 

## 2018-01-17 NOTE — Progress Notes (Signed)
Called to room to assist during endoscopic procedure.  Patient ID and intended procedure confirmed with present staff. Received instructions for my participation in the procedure from the performing physician.  

## 2018-01-17 NOTE — Progress Notes (Signed)
Pt's states no medical or surgical changes since previsit or office visit. 

## 2018-01-18 ENCOUNTER — Telehealth: Payer: Self-pay | Admitting: *Deleted

## 2018-01-18 NOTE — Telephone Encounter (Signed)
  Follow up Call-  Call back number 01/17/2018 12/01/2016  Post procedure Call Back phone  # [phone]  Permission to leave phone message Yes Yes  Some recent data might be hidden     Patient questions:  Do you have a fever, pain , or abdominal swelling? No. Pain Score  0 *  Have you tolerated food without any problems? Yes.    Have you been able to return to your normal activities? Yes.    Do you have any questions about your discharge instructions: Diet   No. Medications  No. Follow up visit  No.  Do you have questions or concerns about your Care? No.  Actions: * If pain score is 4 or above: No action needed, pain <4.

## 2018-01-27 ENCOUNTER — Encounter: Payer: Self-pay | Admitting: Internal Medicine

## 2018-01-27 DIAGNOSIS — Z86010 Personal history of colonic polyps: Secondary | ICD-10-CM

## 2018-01-27 DIAGNOSIS — Z860101 Personal history of adenomatous and serrated colon polyps: Secondary | ICD-10-CM

## 2018-01-27 HISTORY — DX: Personal history of adenomatous and serrated colon polyps: Z86.0101

## 2018-01-27 HISTORY — DX: Personal history of colonic polyps: Z86.010

## 2018-01-27 NOTE — Progress Notes (Signed)
3 dimin adenomas recall 2024

## 2018-01-27 NOTE — Progress Notes (Signed)
My Chart letter

## 2018-02-04 ENCOUNTER — Other Ambulatory Visit: Payer: Self-pay | Admitting: Family Medicine

## 2018-03-07 ENCOUNTER — Ambulatory Visit (INDEPENDENT_AMBULATORY_CARE_PROVIDER_SITE_OTHER): Payer: Medicare Other | Admitting: Family Medicine

## 2018-03-07 DIAGNOSIS — E6609 Other obesity due to excess calories: Secondary | ICD-10-CM | POA: Diagnosis not present

## 2018-03-07 DIAGNOSIS — E782 Mixed hyperlipidemia: Secondary | ICD-10-CM

## 2018-03-07 DIAGNOSIS — R739 Hyperglycemia, unspecified: Secondary | ICD-10-CM | POA: Diagnosis not present

## 2018-03-07 DIAGNOSIS — I10 Essential (primary) hypertension: Secondary | ICD-10-CM

## 2018-03-07 NOTE — Patient Instructions (Addendum)
Dr Leafy Ro is the weight loss doctor at [facility] call if you are ready for a referral  Consider a daily probiotic by the [place] can order at [email or web address] Continue daily fiber supplement  DASH Eating Plan DASH stands for "Dietary Approaches to Stop Hypertension." The DASH eating plan is a healthy eating plan that has been shown to reduce high blood pressure (hypertension). It may also reduce your risk for type 2 diabetes, heart disease, and stroke. The DASH eating plan may also help with weight loss. What are tips for following this plan? General guidelines  Avoid eating more than 2,300 mg (milligrams) of salt (sodium) a day. If you have hypertension, you may need to reduce your sodium intake to 1,500 mg a day.  Limit alcohol intake to no more than 1 drink a day for nonpregnant women and 2 drinks a day for men. One drink equals 12 oz of beer, 5 oz of wine, or 1 oz of hard liquor.  Work with your health care provider to maintain a healthy body weight or to lose weight. Ask what an ideal weight is for you.  Get at least 30 minutes of exercise that causes your heart to beat faster (aerobic exercise) most days of the week. Activities may include walking, swimming, or biking.  Work with your health care provider or diet and nutrition specialist (dietitian) to adjust your eating plan to your individual calorie needs. Reading food labels  Check food labels for the amount of sodium per serving. Choose foods with less than 5 percent of the Daily Value of sodium. Generally, foods with less than 300 mg of sodium per serving fit into this eating plan.  To find whole grains, look for the word "whole" as the first word in the ingredient list. Shopping  Buy products labeled as "low-sodium" or "no salt added."  Buy fresh foods. Avoid canned foods and premade or frozen meals. Cooking  Avoid adding salt when cooking. Use salt-free seasonings or herbs instead of table salt or  sea salt. Check with your health care provider or pharmacist before using salt substitutes.  Do not fry foods. Cook foods using healthy methods such as baking, boiling, grilling, and broiling instead.  Cook with heart-healthy oils, such as olive, canola, soybean, or sunflower oil. Meal planning   Eat a balanced diet that includes: ? 5 or more servings of fruits and vegetables each day. At each meal, try to fill half of your plate with fruits and vegetables. ? Up to 6-8 servings of whole grains each day. ? Less than 6 oz of lean meat, poultry, or fish each day. A 3-oz serving of meat is about the same size as a deck of cards. One egg equals 1 oz. ? 2 servings of low-fat dairy each day. ? A serving of nuts, seeds, or beans 5 times each week. ? Heart-healthy fats. Healthy fats called Omega-3 fatty acids are found in foods such as flaxseeds and coldwater fish, like sardines, salmon, and mackerel.  Limit how much you eat of the following: ? Canned or prepackaged foods. ? Food that is high in trans fat, such as fried foods. ? Food that is high in saturated fat, such as fatty meat. ? Sweets, desserts, sugary drinks, and other foods with added sugar. ? Full-fat dairy products.  Do not salt foods before eating.  Try to eat at least 2 vegetarian meals each week.  Eat more home-cooked food and less restaurant, buffet, and fast food.  When eating at a restaurant, ask that your food be prepared with less salt or no salt, if possible. What foods are recommended? The items listed may not be a complete list. Talk with your dietitian about what dietary choices are best for you. Grains Whole-grain or whole-wheat bread. Whole-grain or whole-wheat pasta. Brown rice. [place]. Bulgur. Whole-grain and low-sodium cereals. Pita bread. Low-fat, low-sodium crackers. Whole-wheat flour tortillas. Vegetables Fresh or frozen vegetables (raw, steamed, roasted, or grilled). Low-sodium or reduced-sodium  tomato and vegetable juice. Low-sodium or reduced-sodium tomato sauce and tomato paste. Low-sodium or reduced-sodium canned vegetables. Fruits All fresh, dried, or frozen fruit. Canned fruit in natural juice (without added sugar). Meat and other protein foods Skinless chicken or [place]. Ground chicken or [place]. Pork with fat trimmed off. Fish and seafood. Egg whites. Dried beans, peas, or lentils. Unsalted nuts, nut butters, and seeds. Unsalted canned beans. Lean cuts of beef with fat trimmed off. Low-sodium, lean deli meat. Dairy Low-fat (1%) or fat-free (skim) milk. Fat-free, low-fat, or reduced-fat cheeses. Nonfat, low-sodium ricotta or cottage cheese. Low-fat or nonfat yogurt. Low-fat, low-sodium cheese. Fats and oils Soft margarine without trans fats. Vegetable oil. Low-fat, reduced-fat, or light mayonnaise and salad dressings (reduced-sodium). Canola, safflower, olive, soybean, and sunflower oils. Avocado. Seasoning and other foods Herbs. Spices. Seasoning mixes without salt. Unsalted popcorn and pretzels. Fat-free sweets. What foods are not recommended? The items listed may not be a complete list. Talk with your dietitian about what dietary choices are best for you. Grains Baked goods made with fat, such as croissants, muffins, or some breads. Dry pasta or rice meal packs. Vegetables Creamed or fried vegetables. Vegetables in a cheese sauce. Regular canned vegetables (not low-sodium or reduced-sodium). Regular canned tomato sauce and paste (not low-sodium or reduced-sodium). Regular tomato and vegetable juice (not low-sodium or reduced-sodium). Angie Fava. Olives. Fruits Canned fruit in a light or heavy syrup. Fried fruit. Fruit in cream or butter sauce. Meat and other protein foods Fatty cuts of meat. Ribs. Fried meat. Berniece Salines. Sausage. Bologna and other processed lunch meats. Salami. Fatback. Hotdogs. Bratwurst. Salted nuts and seeds. Canned beans with added salt. Canned or smoked fish.  Whole eggs or egg yolks. Chicken or [place] with skin. Dairy Whole or 2% milk, cream, and half-and-half. Whole or full-fat cream cheese. Whole-fat or sweetened yogurt. Full-fat cheese. Nondairy creamers. Whipped toppings. Processed cheese and cheese spreads. Fats and oils Butter. Stick margarine. Lard. Shortening. Ghee. Bacon fat. Tropical oils, such as coconut, palm kernel, or palm oil. Seasoning and other foods Salted popcorn and pretzels. Onion salt, garlic salt, seasoned salt, table salt, and sea salt. Worcestershire sauce. Tartar sauce. Barbecue sauce. Teriyaki sauce. Soy sauce, including reduced-sodium. Steak sauce. Canned and packaged gravies. Fish sauce. Oyster sauce. Cocktail sauce. Horseradish that you find on the shelf. Ketchup. Mustard. Meat flavorings and tenderizers. Bouillon cubes. Hot sauce and Tabasco sauce. Premade or packaged marinades. Premade or packaged taco seasonings. Relishes. Regular salad dressings. Where to find more information:  National Heart, Lung, and [place]: https://wilson-eaton.com/  American Heart Association: www.heart.org Summary  The DASH eating plan is a healthy eating plan that has been shown to reduce high blood pressure (hypertension). It may also reduce your risk for type 2 diabetes, heart disease, and stroke.  With the DASH eating plan, you should limit salt (sodium) intake to 2,300 mg a day. If you have hypertension, you may need to reduce your sodium intake to 1,500 mg a day.  When on the DASH eating plan,  aim to eat more fresh fruits and vegetables, whole grains, lean proteins, low-fat dairy, and heart-healthy fats.  Work with your health care provider or diet and nutrition specialist (dietitian) to adjust your eating plan to your individual calorie needs. This information is not intended to replace advice given to you by your health care provider. Make sure you discuss any questions you have with your health care provider. Document Released:  07/29/2011 Document Revised: 08/02/2016 Document Reviewed: 08/02/2016 Elsevier Interactive Patient Education  [employer].

## 2018-03-07 NOTE — Progress Notes (Signed)
Subjective:  I acted as a Education administrator for Dr. Charlett Blake. Renee [name], [place]  Patient ID: Renee [name], female    DOB: Nov 23, 1945, 72 y.o.   MRN: [id number]  No chief complaint on file.   HPI  Patient is in today for A 3 month follow up she is following up on her HTN, and other medical concerns. She has no acute concerns today. No recent febrile illness or acute hospitalizations. Denies CP/palp/SOB/HA/congestion/fevers/GI or GU c/o. Taking meds as prescribed. No polyuria or polydipsia noted. NIs trying to maintain a heart healthy diet and stay active.    Patient Care Team: [name], MD as PCP - General (Family Medicine) Melissa Montane, MD as Consulting Physician (Otolaryngology) Gatha Mayer, MD as Consulting Physician (Gastroenterology) Dr Shanon Brow Daughtry (Optometry) Michael Boston, MD as Consulting Physician (General Surgery) Love, Alyson Locket, MD as Consulting Physician (Neurology)   Past Medical History:  Diagnosis Date  . Abdominal pain 07/05/2017  . ABNORMAL INVOLUNTARY MOVEMENTS 01/01/2009   Qualifier: Diagnosis of  By: Linna Darner MD, Gwyndolyn Saxon    . Allergic rhinitis   . Allergic rhinitis 09/19/2008   Qualifier: Diagnosis of  By: Wynona Luna   . Allergy   . Cervical cancer screening 05/08/2013   Menarche 11, regular Menopause 2s G2P2 s/p 2 svd No abnormal paps or MGM Fatty cyst on labia removed.   . Cervical pain (neck) 10/07/2015  . CHEST PAIN 09/19/2008   Qualifier: Diagnosis of  By: Wynona Luna   . 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 Concussion 1969  . Diverticulosis 05/06/2017  . Essential hypertension 09/19/2008   Qualifier: Diagnosis of  By: Wynona Luna   . GERD (gastroesophageal reflux disease)   . H/O osteopenia 02/08/2016  . H/O tobacco use, presenting hazards to health 09/19/2008   Qualifier: Diagnosis of  By: Wynona Luna Smoking roughly 5 cig daily  Last cigarette was in December of 2016  . Head tremor  12/27/2016  . 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 Hx of adenomatous colonic polyps 01/27/2018  . Hyperglycemia 10/07/2016  . Hyperlipidemia   . Hyperlipidemia, mixed 10/03/2008   Qualifier: Diagnosis of  By: Wynona Luna   . Hypertension   . 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 Obesity 07/06/2016  . Occult blood positive stool   . Polyp of sigmoid colon 12/08/2016   11/2016 - Unable to remove or get bx at colonoscopy. Fixed sigmoid colon prevents. Referred for surgery.  Marland Kitchen PONV (postoperative nausea and vomiting)    ,   . Preventative health care 05/12/2013  . 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 Tobacco abuse   . Tremor, essential   . TREMOR, ESSENTIAL 09/19/2008   Qualifier: Diagnosis of  By: Wynona Luna   . Vertigo   . Vertigo     Past Surgical History:  Procedure Laterality Date  . INNER EAR SURGERY  2015   with tube  . MOUTH SURGERY  01/22/11   lower denture secured by titanium bolts  . rectal suction biospy    . XI ROBOTIC ASSISTED LOWER ANTERIOR RESECTION N/A 02/09/2017   Procedure: XI ROBOTIC RESECTION OF SIGMOID COLON, RIGID PROCTOSCOPY;  Surgeon: Michael Boston, MD;  Location: [facility];  Service:  General;  Laterality: N/A;    Family History  Problem Relation Age of Onset  . Heart attack Father 10       deceased  . Stroke Mother   . Hypertension Mother   . Alcohol abuse Mother   . Obesity Daughter   . Heart disease Paternal Grandmother        AAA rupture  . Heart disease Paternal Grandfather        MI  . Colon cancer Neg Hx   . Breast cancer Neg Hx   . Prostate cancer Neg Hx   . Diabetes Neg Hx   . Liver cancer Neg Hx   . Rectal cancer Neg Hx   . Stomach cancer Neg Hx     Social History   Socioeconomic History  . Marital status: Married    Spouse name: Not on file  . Number of children: Not on file  . Years of education: Not on file  .  Highest education level: Not on file  Occupational History  . Not on file  Social Needs  . Financial resource strain: Not on file  . Food insecurity:    Worry: Not on file    Inability: Not on file  . Transportation needs:    Medical: Not on file    Non-medical: Not on file  Tobacco Use  . Smoking status: Former Smoker    Packs/day: 0.50    Types: Cigarettes    Last attempt to quit: 09/07/2015    Years since quitting: 2.5  . Smokeless tobacco: Never Used  Substance and Sexual Activity  . Alcohol use: Yes    Alcohol/week: 8.4 oz    Types: 14 Glasses of wine per week    Comment: 3 glasses of wine daily   . Drug use: No  . Sexual activity: Not Currently  Lifestyle  . Physical activity:    Days per week: Not on file    Minutes per session: Not on file  . Stress: Not on file  Relationships  . Social connections:    Talks on phone: Not on file    Gets together: Not on file    Attends religious service: Not on file    Active member of club or organization: Not on file    Attends meetings of clubs or organizations: Not on file    Relationship status: Not on file  . Intimate partner violence:    Fear of current or ex partner: Not on file    Emotionally abused: Not on file    Physically abused: Not on file    Forced sexual activity: Not on file  Other Topics Concern  . Not on file  Social History Narrative  . Not on file    Outpatient Medications Prior to Visit  Medication Sig Dispense Refill  . atorvastatin (LIPITOR) 10 MG tablet Take 0.5 tablets (5 mg total) by mouth every other day. 15 tablet 5  . b complex vitamins tablet Take 1 tablet by mouth every other day.    . Cholecalciferol (VITAMIN D PO) Take 1 tablet by mouth every other day. 3 times per week    . Coenzyme Q10 200 MG capsule Take 200 mg by mouth every other day. 3 times per week    . metoprolol succinate (TOPROL-XL) 25 MG 24 hr tablet TAKE 1 TABLET BY MOUTH EVERY DAY 30 tablet 2  . Multiple Vitamin  (MULTIVITAMIN WITH MINERALS) TABS tablet Take 1 tablet by mouth every other day.    Marland Kitchen  ranitidine (ZANTAC) 300 MG capsule Take 1 capsule (300 mg total) as needed by mouth for heartburn. 30 capsule 3  . metoprolol succinate (TOPROL-XL) 25 MG 24 hr tablet Take 1 tablet (25 mg total) by mouth daily.    . polyethylene glycol powder (GLYCOLAX/MIRALAX) powder Take 1 Container by mouth once.    Marland Kitchen 0.9 %  sodium chloride infusion     . dextrose 5 % solution      No facility-administered medications prior to visit.     Allergies  Allergen Reactions  . Amoxicillin     REACTION: TROUBLE BREATHING Has patient had a PCN reaction causing immediate rash, facial/tongue/throat swelling, SOB or lightheadedness with hypotension:Yes Has patient had a PCN reaction causing severe rash involving mucus membranes or skin necrosis:No Has patient had a PCN reaction that required hospitalization:No Has patient had a PCN reaction occurring within the last 10 years:No If all of the above answers are "NO", then may proceed with Cephalosporin use.   Abe People Hfa [Albuterol] Other (See Comments)    Hypersensitivity, anxious, disoriented  . Penicillins Rash    Has patient had a PCN reaction causing immediate rash, facial/tongue/throat swelling, SOB or lightheadedness with hypotension:unknown Has patient had a PCN reaction causing severe rash involving mucus membranes or skin necrosis:unknown Has patient had a PCN reaction that required hospitalization:no Has patient had a PCN reaction occurring within the last 10 years:no If all of the above answers are "NO", then may proceed with Cephalosporin use.     Review of Systems  Constitutional: Negative for fever and malaise/fatigue.  HENT: Negative for congestion.   Eyes: Negative for blurred vision.  Respiratory: Negative for shortness of breath.   Cardiovascular: Negative for chest pain, palpitations and leg swelling.  Gastrointestinal: Negative for abdominal pain,  blood in stool and nausea.  Genitourinary: Negative for dysuria and frequency.  Musculoskeletal: Negative for falls.  Skin: Negative for rash.  Neurological: Negative for dizziness, loss of consciousness and headaches.  Endo/Heme/Allergies: Negative for environmental allergies.  Psychiatric/Behavioral: Negative for depression. The patient is not nervous/anxious.        Objective:    Physical Exam  Constitutional: She is oriented to person, place, and time. No distress.  HENT:  Head: Normocephalic and atraumatic.  Eyes: Conjunctivae are normal.  Neck: Neck supple. No thyromegaly present.  Cardiovascular: Normal rate and regular rhythm. Exam reveals no friction rub.  No murmur heard. Pulmonary/Chest: Effort normal and breath sounds normal. She has no wheezes.  Abdominal: She exhibits no distension and no mass.  Musculoskeletal: She exhibits no edema.  Lymphadenopathy:    She has no cervical adenopathy.  Neurological: She is alert and oriented to person, place, and time.  Skin: Skin is warm and dry. No rash noted. She is not diaphoretic.  Psychiatric: Judgment normal.    BP 128/78   Pulse 70   Temp 98.4 F (36.9 C) (Oral)   Resp 18   Ht 5\' 6"  (1.676 m)   Wt 198 lb 12.8 oz (90.2 kg)   SpO2 96%   BMI 32.09 kg/m  Wt Readings from Last 3 Encounters:  03/07/18 198 lb 12.8 oz (90.2 kg)  01/17/18 198 lb (89.8 kg)  01/03/18 198 lb 3.2 oz (89.9 kg)   BP Readings from Last 3 Encounters:  03/07/18 128/78  01/17/18 (!) 145/76  01/04/18 137/87     Immunization History  Administered Date(s) Administered  . Pneumococcal Conjugate-13 07/06/2016    Health Maintenance  Topic Date Due  .  Hepatitis C Screening  12-10-1945  . TETANUS/TDAP  01/30/1965  . PNA vac Low Risk Adult (2 of 2 - PPSV23) 07/06/2017  . INFLUENZA VACCINE  05/11/2018 (Originally 03/23/2018)  . MAMMOGRAM  09/02/2018  . COLONOSCOPY  01/18/2023  . DEXA SCAN  Completed    Lab Results  Component Value Date    WBC 6.7 12/06/2017   HGB 15.3 (H) 12/06/2017   HCT 45.9 12/06/2017   PLT 245.0 12/06/2017   GLUCOSE 106 (H) 12/08/2017   CHOL 233 (H) 12/06/2017   TRIG 87.0 12/06/2017   HDL 80.80 12/06/2017   LDLDIRECT 148.6 09/26/2008   LDLCALC 135 (H) 12/06/2017   ALT 21 12/08/2017   AST 19 12/08/2017   NA 137 12/08/2017   K 4.0 12/08/2017   CL 103 12/08/2017   CREATININE 0.69 12/08/2017   BUN 12 12/08/2017   CO2 26 12/08/2017   TSH 1.19 12/06/2017   HGBA1C 5.8 12/06/2017    Lab Results  Component Value Date   TSH 1.19 12/06/2017   Lab Results  Component Value Date   WBC 6.7 12/06/2017   HGB 15.3 (H) 12/06/2017   HCT 45.9 12/06/2017   MCV 102.5 (H) 12/06/2017   PLT 245.0 12/06/2017   Lab Results  Component Value Date   NA 137 12/08/2017   K 4.0 12/08/2017   CO2 26 12/08/2017   GLUCOSE 106 (H) 12/08/2017   BUN 12 12/08/2017   CREATININE 0.69 12/08/2017   BILITOT 0.8 12/08/2017   ALKPHOS 82 12/08/2017   AST 19 12/08/2017   ALT 21 12/08/2017   PROT 6.6 12/08/2017   ALBUMIN 4.0 12/08/2017   CALCIUM 8.8 12/08/2017   ANIONGAP 6 02/10/2017   GFR 88.93 12/08/2017   Lab Results  Component Value Date   CHOL 233 (H) 12/06/2017   Lab Results  Component Value Date   HDL 80.80 12/06/2017   Lab Results  Component Value Date   LDLCALC 135 (H) 12/06/2017   Lab Results  Component Value Date   TRIG 87.0 12/06/2017   Lab Results  Component Value Date   CHOLHDL 3 12/06/2017   Lab Results  Component Value Date   HGBA1C 5.8 12/06/2017         Assessment & Plan:   Problem List Items Addressed This Visit    Hyperlipidemia, mixed    Encouraged heart healthy diet, increase exercise, avoid trans fats, consider a krill oil cap daily      Relevant Orders   Lipid panel   Essential hypertension    Well controlled, no changes to meds. Encouraged heart healthy diet such as the DASH diet and exercise as tolerated.       Relevant Orders   CBC   Comprehensive metabolic  panel   TSH   Obesity    Encouraged DASH diet, decrease po intake and increase exercise as tolerated. Needs 7-8 hours of sleep nightly. Avoid trans fats, eat small, frequent meals every 4-5 hours with lean proteins, complex carbs and healthy fats. Minimize simple carbs      Hyperglycemia    hgba1c acceptable, minimize simple carbs. Increase exercise as tolerated.      Relevant Orders   Hemoglobin A1c      I have discontinued Bernis L. Sensabaugh's polyethylene glycol powder. I am also having her maintain her b complex vitamins, Cholecalciferol (VITAMIN D PO), multivitamin with minerals, Coenzyme Q10, ranitidine, atorvastatin, and metoprolol succinate. We will stop administering sodium chloride and dextrose.  No orders of the defined types were  placed in this encounter.   CMA served as Education administrator during this visit. History, Physical and Plan performed by medical provider. Documentation and orders reviewed and attested to.  Penni Homans, MD

## 2018-03-07 NOTE — Assessment & Plan Note (Signed)
Well controlled, no changes to meds. Encouraged heart healthy diet such as the DASH diet and exercise as tolerated.  °

## 2018-03-07 NOTE — Assessment & Plan Note (Signed)
Encouraged heart healthy diet, increase exercise, avoid trans fats, consider a krill oil cap daily 

## 2018-03-07 NOTE — Assessment & Plan Note (Signed)
Encouraged DASH diet, decrease po intake and increase exercise as tolerated. Needs 7-8 hours of sleep nightly. Avoid trans fats, eat small, frequent meals every 4-5 hours with lean proteins, complex carbs and healthy fats. Minimize simple carbs 

## 2018-03-07 NOTE — Assessment & Plan Note (Signed)
hgba1c acceptable, minimize simple carbs. Increase exercise as tolerated.  

## 2018-05-09 ENCOUNTER — Other Ambulatory Visit: Payer: Self-pay | Admitting: Family Medicine

## 2018-05-29 ENCOUNTER — Other Ambulatory Visit: Payer: Self-pay

## 2018-05-29 NOTE — Patient Outreach (Signed)
[facility]) Care Management  05/29/2018  AMANDINE COVINO 31-Mar-1946 [id number]   Medication Adherence call to Mrs. Bonnetta Barry patient did not answer patient is due on Atorvastatin 10 mg.Mrs. Riendeau is showing past due under [facility].   [facility] Management Direct Dial [phone]  Fax [phone] [email or web address]

## 2018-06-08 ENCOUNTER — Other Ambulatory Visit: Payer: Self-pay | Admitting: Family Medicine

## 2018-06-26 ENCOUNTER — Other Ambulatory Visit (INDEPENDENT_AMBULATORY_CARE_PROVIDER_SITE_OTHER): Payer: Medicare Other

## 2018-06-26 DIAGNOSIS — E782 Mixed hyperlipidemia: Secondary | ICD-10-CM

## 2018-06-26 DIAGNOSIS — R739 Hyperglycemia, unspecified: Secondary | ICD-10-CM

## 2018-06-26 DIAGNOSIS — I10 Essential (primary) hypertension: Secondary | ICD-10-CM

## 2018-06-26 LAB — LIPID PANEL
CHOL/HDL RATIO: 3
Cholesterol: 233 mg/dL — ABNORMAL HIGH (ref 0–200)
HDL: 72.8 mg/dL (ref >39.00)
LDL CALC: 138 mg/dL — AB (ref 0–99)
NonHDL: 160.36
TRIGLYCERIDES: 111 mg/dL (ref 0.0–149.0)
VLDL: 22.2 mg/dL (ref 0.0–40.0)

## 2018-06-26 LAB — COMPREHENSIVE METABOLIC PANEL
ALT: 22 U/L (ref 0–35)
AST: 20 U/L (ref 0–37)
Albumin: 4.1 g/dL (ref 3.5–5.2)
Alkaline Phosphatase: 86 U/L (ref 39–117)
BUN: 14 mg/dL (ref 6–23)
CHLORIDE: 103 meq/L (ref 96–112)
CO2: 26 meq/L (ref 19–32)
CREATININE: 0.72 mg/dL (ref 0.40–1.20)
Calcium: 9 mg/dL (ref 8.4–10.5)
GFR: 84.53 mL/min (ref >60.00)
Glucose, Bld: 106 mg/dL — ABNORMAL HIGH (ref 70–99)
Potassium: 4.4 mEq/L (ref 3.5–5.1)
SODIUM: 138 meq/L (ref 135–145)
Total Bilirubin: 0.7 mg/dL (ref 0.2–1.2)
Total Protein: 6.6 g/dL (ref 6.0–8.3)

## 2018-06-26 LAB — HEMOGLOBIN A1C: HEMOGLOBIN A1C: 5.8 % (ref 4.6–6.5)

## 2018-06-26 LAB — CBC
HCT: 43.5 % (ref 36.0–46.0)
Hemoglobin: 14.7 g/dL (ref 12.0–15.0)
MCHC: 33.8 g/dL (ref 30.0–36.0)
MCV: 102.5 fl — AB (ref 78.0–100.0)
Platelets: 246 10*3/uL (ref 150.0–400.0)
RBC: 4.24 Mil/uL (ref 3.87–5.11)
RDW: 14.7 % (ref 11.5–15.5)
WBC: 6.5 10*3/uL (ref 4.0–10.5)

## 2018-06-26 LAB — TSH: TSH: 1.81 u[IU]/mL (ref 0.35–4.50)

## 2018-06-29 ENCOUNTER — Encounter: Payer: Self-pay | Admitting: Family Medicine

## 2018-06-29 ENCOUNTER — Ambulatory Visit (INDEPENDENT_AMBULATORY_CARE_PROVIDER_SITE_OTHER): Payer: Medicare Other | Admitting: Family Medicine

## 2018-06-29 ENCOUNTER — Telehealth: Payer: Self-pay | Admitting: Family Medicine

## 2018-06-29 DIAGNOSIS — R739 Hyperglycemia, unspecified: Secondary | ICD-10-CM

## 2018-06-29 DIAGNOSIS — I10 Essential (primary) hypertension: Secondary | ICD-10-CM | POA: Diagnosis not present

## 2018-06-29 DIAGNOSIS — E782 Mixed hyperlipidemia: Secondary | ICD-10-CM

## 2018-06-29 DIAGNOSIS — G8929 Other chronic pain: Secondary | ICD-10-CM

## 2018-06-29 DIAGNOSIS — E785 Hyperlipidemia, unspecified: Secondary | ICD-10-CM | POA: Diagnosis not present

## 2018-06-29 DIAGNOSIS — M25519 Pain in unspecified shoulder: Secondary | ICD-10-CM

## 2018-06-29 DIAGNOSIS — M25512 Pain in left shoulder: Secondary | ICD-10-CM | POA: Diagnosis not present

## 2018-06-29 HISTORY — DX: Pain in unspecified shoulder: M25.519

## 2018-06-29 MED ORDER — METOPROLOL SUCCINATE ER 25 MG PO TB24: 12.5000 mg | ORAL_TABLET | Freq: Every day | ORAL | 1 refills | Status: DC

## 2018-06-29 NOTE — Assessment & Plan Note (Signed)
Hurts worse since falling in March at the [facility]. Hurts with movement especially. Will refer to sports med for evaluation

## 2018-06-29 NOTE — Patient Instructions (Addendum)
Aspercreme with Lidcaine gel for shoulder pain   shingrix is the new shingles shot, 2 shots over 2-6 months at pharmacy  Shoulder Pain Many things can cause shoulder pain, including:  An injury to the area.  Overuse of the shoulder.  Arthritis.  The source of the pain can be:  Inflammation.  An injury to the shoulder joint.  An injury to a tendon, ligament, or bone.  Follow these instructions at home: Take these actions to help with your pain:  Squeeze a soft ball or a foam pad as much as possible. This helps to keep the shoulder from swelling. It also helps to strengthen the arm.  Take over-the-counter and prescription medicines only as told by your health care provider.  If directed, apply ice to the area: ? Put ice in a plastic bag. ? Place a towel between your skin and the bag. ? Leave the ice on for 20 minutes, 2-3 times per day. Stop applying ice if it does not help with the pain.  If you were given a shoulder sling or immobilizer: ? Wear it as told. ? Remove it to shower or bathe. ? Move your arm as little as possible, but keep your hand moving to prevent swelling.  Contact a health care provider if:  Your pain gets worse.  Your pain is not relieved with medicines.  New pain develops in your arm, hand, or fingers. Get help right away if:  Your arm, hand, or fingers: ? Tingle. ? Become numb. ? Become swollen. ? Become painful. ? Turn white or blue. This information is not intended to replace advice given to you by your health care provider. Make sure you discuss any questions you have with your health care provider. Document Released: 05/19/2005 Document Revised: 04/04/2016 Document Reviewed: 12/02/2014 Elsevier Interactive Patient Education  [employer].

## 2018-06-29 NOTE — Telephone Encounter (Signed)
I did agree to see the patient's daughter

## 2018-06-29 NOTE — Assessment & Plan Note (Signed)
Tolerating statin, encouraged heart healthy diet, avoid trans fats, minimize simple carbs and saturated fats. Increase exercise as tolerated 

## 2018-06-29 NOTE — Assessment & Plan Note (Signed)
hgba1c acceptable, minimize simple carbs. Increase exercise as tolerated.  

## 2018-06-29 NOTE — Progress Notes (Signed)
Subjective:    Patient ID: Renee [name], female    DOB: 09/09/1945, 72 y.o.   MRN: [id number]  Chief Complaint  Patient presents with  . Hyperlipidemia    Here for follow up  . Hypertension    HPI Patient is in today for follow up. No recent febile illness or hospitalizations. No polyuria or polydipsia. She is struggling with persistent left shoulder pain. No recent fall or injury. Is staying active and tries to maintain a heart healthy diet. Denies CP/palp/SOB/HA/congestion/fevers/GI or GU c/o. Taking meds as prescribed  Past Medical History:  Diagnosis Date  . Abdominal pain 07/05/2017  . ABNORMAL INVOLUNTARY MOVEMENTS 01/01/2009   Qualifier: Diagnosis of  By: Linna Darner MD, Gwyndolyn Saxon    . Allergic rhinitis   . Allergic rhinitis 09/19/2008   Qualifier: Diagnosis of  By: Wynona Luna   . Allergy   . Cervical cancer screening 05/08/2013   Menarche 11, regular Menopause 20s G2P2 s/p 2 svd No abnormal paps or MGM Fatty cyst on labia removed.   . Cervical pain (neck) 10/07/2015  . CHEST PAIN 09/19/2008   Qualifier: Diagnosis of  By: Wynona Luna   . 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 Concussion 1969  . Diverticulosis 05/06/2017  . Essential hypertension 09/19/2008   Qualifier: Diagnosis of  By: Wynona Luna   . GERD (gastroesophageal reflux disease)   . H/O osteopenia 02/08/2016  . H/O tobacco use, presenting hazards to health 09/19/2008   Qualifier: Diagnosis of  By: Wynona Luna Smoking roughly 5 cig daily  Last cigarette was in December of 2016  . Head tremor 12/27/2016  . 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 Hx of adenomatous colonic polyps 01/27/2018  . Hyperglycemia 10/07/2016  . Hyperlipidemia   . Hyperlipidemia, mixed 10/03/2008   Qualifier: Diagnosis of  By: Wynona Luna   . Hypertension   . 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 Obesity 07/06/2016  . Occult blood positive stool   . Polyp of sigmoid colon 12/08/2016   11/2016 - Unable to remove or get bx at colonoscopy. Fixed sigmoid colon prevents. Referred for surgery.  Marland Kitchen PONV (postoperative nausea and vomiting)    ,   . Preventative health care 05/12/2013  . 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 Tobacco abuse   . Tremor, essential   . TREMOR, ESSENTIAL 09/19/2008   Qualifier: Diagnosis of  By: Wynona Luna   . Vertigo   . Vertigo     Past Surgical History:  Procedure Laterality Date  . INNER EAR SURGERY  2015   with tube  . MOUTH SURGERY  01/22/11   lower denture secured by titanium bolts  . rectal suction biospy    . XI ROBOTIC ASSISTED LOWER ANTERIOR RESECTION N/A 02/09/2017   Procedure: XI ROBOTIC RESECTION OF SIGMOID COLON, RIGID PROCTOSCOPY;  Surgeon: Michael Boston, MD;  Location: [facility];  Service: General;  Laterality: N/A;    Family History  Problem Relation Age of Onset  . Heart attack Father 43       deceased  . Stroke Mother   . Hypertension Mother   . Alcohol abuse Mother   . Obesity Daughter   . Heart disease Paternal Grandmother  AAA rupture  . Heart disease Paternal Grandfather        MI  . Colon cancer Neg Hx   . Breast cancer Neg Hx   . Prostate cancer Neg Hx   . Diabetes Neg Hx   . Liver cancer Neg Hx   . Rectal cancer Neg Hx   . Stomach cancer Neg Hx     Social History   Socioeconomic History  . Marital status: Married    Spouse name: Not on file  . Number of children: Not on file  . Years of education: Not on file  . Highest education level: Not on file  Occupational History  . Not on file  Social Needs  . Financial resource strain: Not on file  . Food insecurity:    Worry: Not on file    Inability: Not on file  . Transportation needs:    Medical: Not on file    Non-medical: Not on file  Tobacco Use  . Smoking status: Former Smoker    Packs/day: 0.50     Types: Cigarettes    Last attempt to quit: 09/07/2015    Years since quitting: 2.8  . Smokeless tobacco: Never Used  Substance and Sexual Activity  . Alcohol use: Yes    Alcohol/week: 14.0 standard drinks    Types: 14 Glasses of wine per week    Comment: 3 glasses of wine daily   . Drug use: No  . Sexual activity: Not Currently  Lifestyle  . Physical activity:    Days per week: Not on file    Minutes per session: Not on file  . Stress: Not on file  Relationships  . Social connections:    Talks on phone: Not on file    Gets together: Not on file    Attends religious service: Not on file    Active member of club or organization: Not on file    Attends meetings of clubs or organizations: Not on file    Relationship status: Not on file  . Intimate partner violence:    Fear of current or ex partner: Not on file    Emotionally abused: Not on file    Physically abused: Not on file    Forced sexual activity: Not on file  Other Topics Concern  . Not on file  Social History Narrative  . Not on file    Outpatient Medications Prior to Visit  Medication Sig Dispense Refill  . atorvastatin (LIPITOR) 10 MG tablet Take 0.5 tablets (5 mg total) by mouth every other day. 15 tablet 5  . b complex vitamins tablet Take 1 tablet by mouth every other day.    . Cholecalciferol (VITAMIN D PO) Take 1 tablet by mouth every other day. 3 times per week    . Coenzyme Q10 200 MG capsule Take 200 mg by mouth every other day. 3 times per week    . metoprolol succinate (TOPROL-XL) 25 MG 24 hr tablet Take 1 tablet (25 mg total) by mouth daily. 90 tablet 0  . Multiple Vitamin (MULTIVITAMIN WITH MINERALS) TABS tablet Take 1 tablet by mouth every other day.    . ranitidine (ZANTAC) 300 MG capsule Take 1 capsule (300 mg total) as needed by mouth for heartburn. 30 capsule 3   No facility-administered medications prior to visit.     Allergies  Allergen Reactions  . Amoxicillin     REACTION: TROUBLE  BREATHING Has patient had a PCN reaction causing immediate rash, facial/tongue/throat swelling,  SOB or lightheadedness with hypotension:Yes Has patient had a PCN reaction causing severe rash involving mucus membranes or skin necrosis:No Has patient had a PCN reaction that required hospitalization:No Has patient had a PCN reaction occurring within the last 10 years:No If all of the above answers are "NO", then may proceed with Cephalosporin use.   Abe People Hfa [Albuterol] Other (See Comments)    Hypersensitivity, anxious, disoriented  . Penicillins Rash    Has patient had a PCN reaction causing immediate rash, facial/tongue/throat swelling, SOB or lightheadedness with hypotension:unknown Has patient had a PCN reaction causing severe rash involving mucus membranes or skin necrosis:unknown Has patient had a PCN reaction that required hospitalization:no Has patient had a PCN reaction occurring within the last 10 years:no If all of the above answers are "NO", then may proceed with Cephalosporin use.     Review of Systems  Constitutional: Negative for fever and malaise/fatigue.  HENT: Negative for congestion.   Eyes: Negative for blurred vision.  Respiratory: Negative for shortness of breath.   Cardiovascular: Negative for chest pain, palpitations and leg swelling.  Gastrointestinal: Negative for abdominal pain, blood in stool and nausea.  Genitourinary: Negative for dysuria and frequency.  Musculoskeletal: Positive for joint pain. Negative for falls.  Skin: Negative for rash.  Neurological: Negative for dizziness, loss of consciousness and headaches.  Endo/Heme/Allergies: Negative for environmental allergies.  Psychiatric/Behavioral: Negative for depression. The patient is not nervous/anxious.        Objective:    Physical Exam  Constitutional: She is oriented to person, place, and time. She appears well-developed and well-nourished. No distress.  HENT:  Head: Normocephalic and  atraumatic.  Nose: Nose normal.  Eyes: Right eye exhibits no discharge. Left eye exhibits no discharge.  Neck: Normal range of motion. Neck supple.  Cardiovascular: Normal rate and regular rhythm.  No murmur heard. Pulmonary/Chest: Effort normal and breath sounds normal.  Abdominal: Soft. Bowel sounds are normal. There is no tenderness.  Musculoskeletal: She exhibits no edema.  Neurological: She is alert and oriented to person, place, and time.  Skin: Skin is warm and dry.  Psychiatric: She has a normal mood and affect.  Nursing note and vitals reviewed.   BP 134/84   Pulse 77   Temp 98.2 F (36.8 C) (Oral)   Resp 16   Ht 5\' 6"  (1.676 m)   Wt 202 lb (91.6 kg)   SpO2 95%   BMI 32.60 kg/m  Wt Readings from Last 3 Encounters:  06/29/18 202 lb (91.6 kg)  03/07/18 198 lb 12.8 oz (90.2 kg)  01/17/18 198 lb (89.8 kg)     Lab Results  Component Value Date   WBC 6.5 06/26/2018   HGB 14.7 06/26/2018   HCT 43.5 06/26/2018   PLT 246.0 06/26/2018   GLUCOSE 106 (H) 06/26/2018   CHOL 233 (H) 06/26/2018   TRIG 111.0 06/26/2018   HDL 72.80 06/26/2018   LDLDIRECT 148.6 09/26/2008   LDLCALC 138 (H) 06/26/2018   ALT 22 06/26/2018   AST 20 06/26/2018   NA 138 06/26/2018   K 4.4 06/26/2018   CL 103 06/26/2018   CREATININE 0.72 06/26/2018   BUN 14 06/26/2018   CO2 26 06/26/2018   TSH 1.81 06/26/2018   HGBA1C 5.8 06/26/2018    Lab Results  Component Value Date   TSH 1.81 06/26/2018   Lab Results  Component Value Date   WBC 6.5 06/26/2018   HGB 14.7 06/26/2018   HCT 43.5 06/26/2018   MCV 102.5 (  H) 06/26/2018   PLT 246.0 06/26/2018   Lab Results  Component Value Date   NA 138 06/26/2018   K 4.4 06/26/2018   CO2 26 06/26/2018   GLUCOSE 106 (H) 06/26/2018   BUN 14 06/26/2018   CREATININE 0.72 06/26/2018   BILITOT 0.7 06/26/2018   ALKPHOS 86 06/26/2018   AST 20 06/26/2018   ALT 22 06/26/2018   PROT 6.6 06/26/2018   ALBUMIN 4.1 06/26/2018   CALCIUM 9.0 06/26/2018     ANIONGAP 6 02/10/2017   GFR 84.53 06/26/2018   Lab Results  Component Value Date   CHOL 233 (H) 06/26/2018   Lab Results  Component Value Date   HDL 72.80 06/26/2018   Lab Results  Component Value Date   LDLCALC 138 (H) 06/26/2018   Lab Results  Component Value Date   TRIG 111.0 06/26/2018   Lab Results  Component Value Date   CHOLHDL 3 06/26/2018   Lab Results  Component Value Date   HGBA1C 5.8 06/26/2018       Assessment & Plan:   Problem List Items Addressed This Visit    RESOLVED: Hyperlipidemia, mixed    Tolerating statin, encouraged heart healthy diet, avoid trans fats, minimize simple carbs and saturated fats. Increase exercise as tolerated      Hyperlipidemia    Tolerating statin, encouraged heart healthy diet, avoid trans fats, minimize simple carbs and saturated fats. Increase exercise as tolerated      Essential hypertension    Well controlled, no changes to meds. Encouraged heart healthy diet such as the DASH diet and exercise as tolerated. Declines flu shot but will consider it later. She declines Shingrix shot but will consider      Hyperglycemia    hgba1c acceptable, minimize simple carbs. Increase exercise as tolerated.      Shoulder pain    Hurts worse since falling in March at the [place]. Hurts with movement especially. Will refer to sports med for evaluation      Relevant Orders   Ambulatory referral to Sports Medicine      I am having Carlissa L. Epling maintain her b complex vitamins, Cholecalciferol (VITAMIN D PO), multivitamin with minerals, Coenzyme Q10, ranitidine, atorvastatin, and metoprolol succinate.  No orders of the defined types were placed in this encounter.    Penni Homans, MD

## 2018-06-29 NOTE — Assessment & Plan Note (Addendum)
Well controlled, no changes to meds. Encouraged heart healthy diet such as the DASH diet and exercise as tolerated. Declines flu shot but will consider it later. She declines Shingrix shot but will consider. Continue the 1/2 of a metoprolol

## 2018-06-29 NOTE — Telephone Encounter (Signed)
Pt stated today provider approved ok to see her daughter Sherryle Lis (DOB 04-12-1969) as new pt. (daughter will call to schedule appt)

## 2018-07-04 NOTE — Telephone Encounter (Signed)
Daughter has new pt. Appointment with Dr. Charlett Blake 10/06/2018.

## 2018-07-06 ENCOUNTER — Encounter: Payer: Self-pay | Admitting: Family Medicine

## 2018-07-06 ENCOUNTER — Ambulatory Visit: Payer: Medicare Other | Admitting: Family Medicine

## 2018-07-06 VITALS — BP 186/95 | HR 69 | Ht 67.0 in | Wt 200.0 lb

## 2018-07-06 DIAGNOSIS — M25512 Pain in left shoulder: Secondary | ICD-10-CM

## 2018-07-06 NOTE — Progress Notes (Signed)
PCP with consultation requested by: [name], MD  Subjective:   HPI: Patient is a 72 y.o. female here for left shoulder pain.  Patient reports she's had about 2 months of off and on left shoulder pain. No acute injury preceding this - had a fall in march but no issues with shoulder following this. Pain worse with overhead motions like when she does tai chi. Pain level 1/10 and shooting into upper arm, anterior left chest. No skin changes, numbness. No neck pain.  Past Medical History:  Diagnosis Date  . Abdominal pain 07/05/2017  . ABNORMAL INVOLUNTARY MOVEMENTS 01/01/2009   Qualifier: Diagnosis of  By: Linna Darner MD, Gwyndolyn Saxon    . Allergic rhinitis   . Allergic rhinitis 09/19/2008   Qualifier: Diagnosis of  By: Wynona Luna   . Allergy   . Cervical cancer screening 05/08/2013   Menarche 11, regular Menopause 82s G2P2 s/p 2 svd No abnormal paps or MGM Fatty cyst on labia removed.   . Cervical pain (neck) 10/07/2015  . CHEST PAIN 09/19/2008   Qualifier: Diagnosis of  By: Wynona Luna   . 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 Concussion 1969  . Diverticulosis 05/06/2017  . Essential hypertension 09/19/2008   Qualifier: Diagnosis of  By: Wynona Luna   . GERD (gastroesophageal reflux disease)   . H/O osteopenia 02/08/2016  . H/O tobacco use, presenting hazards to health 09/19/2008   Qualifier: Diagnosis of  By: Wynona Luna Smoking roughly 5 cig daily  Last cigarette was in December of 2016  . Head tremor 12/27/2016  . 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 Hx of adenomatous colonic polyps 01/27/2018  . Hyperglycemia 10/07/2016  . Hyperlipidemia   . Hyperlipidemia, mixed 10/03/2008   Qualifier: Diagnosis of  By: Wynona Luna   . Hypertension   . 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 Obesity 07/06/2016  . Occult blood positive  stool   . Polyp of sigmoid colon 12/08/2016   11/2016 - Unable to remove or get bx at colonoscopy. Fixed sigmoid colon prevents. Referred for surgery.  Marland Kitchen PONV (postoperative nausea and vomiting)    ,   . Preventative health care 05/12/2013  . 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 Tobacco abuse   . Tremor, essential   . TREMOR, ESSENTIAL 09/19/2008   Qualifier: Diagnosis of  By: Wynona Luna   . Vertigo   . Vertigo     Current Outpatient Medications on File Prior to Visit  Medication Sig Dispense Refill  . atorvastatin (LIPITOR) 10 MG tablet Take 0.5 tablets (5 mg total) by mouth every other day. 15 tablet 5  . b complex vitamins tablet Take 1 tablet by mouth every other day.    . Cholecalciferol (VITAMIN D PO) Take 1 tablet by mouth every other day. 3 times per week    . Coenzyme Q10 200 MG capsule Take 200 mg by mouth every other day. 3 times per week    . metoprolol succinate (TOPROL-XL) 25 MG 24 hr tablet Take 0.5 tablets (12.5 mg total) by mouth daily. 45 tablet 1  . Multiple Vitamin (MULTIVITAMIN WITH MINERALS) TABS tablet Take 1 tablet by mouth every other day.    . ranitidine (ZANTAC)  300 MG capsule Take 1 capsule (300 mg total) as needed by mouth for heartburn. 30 capsule 3   No current facility-administered medications on file prior to visit.     Past Surgical History:  Procedure Laterality Date  . INNER EAR SURGERY  2015   with tube  . MOUTH SURGERY  01/22/11   lower denture secured by titanium bolts  . rectal suction biospy    . XI ROBOTIC ASSISTED LOWER ANTERIOR RESECTION N/A 02/09/2017   Procedure: XI ROBOTIC RESECTION OF SIGMOID COLON, RIGID PROCTOSCOPY;  Surgeon: Michael Boston, MD;  Location: [facility];  Service: General;  Laterality: N/A;    Allergies  Allergen Reactions  . Amoxicillin     REACTION: TROUBLE BREATHING Has patient had a PCN reaction causing immediate rash, facial/tongue/throat swelling, SOB or lightheadedness with  hypotension:Yes Has patient had a PCN reaction causing severe rash involving mucus membranes or skin necrosis:No Has patient had a PCN reaction that required hospitalization:No Has patient had a PCN reaction occurring within the last 10 years:No If all of the above answers are "NO", then may proceed with Cephalosporin use.   Abe People Hfa [Albuterol] Other (See Comments)    Hypersensitivity, anxious, disoriented  . Penicillins Rash    Has patient had a PCN reaction causing immediate rash, facial/tongue/throat swelling, SOB or lightheadedness with hypotension:unknown Has patient had a PCN reaction causing severe rash involving mucus membranes or skin necrosis:unknown Has patient had a PCN reaction that required hospitalization:no Has patient had a PCN reaction occurring within the last 10 years:no If all of the above answers are "NO", then may proceed with Cephalosporin use.     Social History   Socioeconomic History  . Marital status: Married    Spouse name: Not on file  . Number of children: Not on file  . Years of education: Not on file  . Highest education level: Not on file  Occupational History  . Not on file  Social Needs  . Financial resource strain: Not on file  . Food insecurity:    Worry: Not on file    Inability: Not on file  . Transportation needs:    Medical: Not on file    Non-medical: Not on file  Tobacco Use  . Smoking status: Former Smoker    Packs/day: 0.50    Types: Cigarettes    Last attempt to quit: 09/07/2015    Years since quitting: 2.8  . Smokeless tobacco: Never Used  Substance and Sexual Activity  . Alcohol use: Yes    Alcohol/week: 14.0 standard drinks    Types: 14 Glasses of wine per week    Comment: 3 glasses of wine daily   . Drug use: No  . Sexual activity: Not Currently  Lifestyle  . Physical activity:    Days per week: Not on file    Minutes per session: Not on file  . Stress: Not on file  Relationships  . Social connections:     Talks on phone: Not on file    Gets together: Not on file    Attends religious service: Not on file    Active member of club or organization: Not on file    Attends meetings of clubs or organizations: Not on file    Relationship status: Not on file  . Intimate partner violence:    Fear of current or ex partner: Not on file    Emotionally abused: Not on file    Physically abused: Not on file  Forced sexual activity: Not on file  Other Topics Concern  . Not on file  Social History Narrative  . Not on file    Family History  Problem Relation Age of Onset  . Heart attack Father 65       deceased  . Stroke Mother   . Hypertension Mother   . Alcohol abuse Mother   . Obesity Daughter   . Heart disease Paternal Grandmother        AAA rupture  . Heart disease Paternal Grandfather        MI  . Colon cancer Neg Hx   . Breast cancer Neg Hx   . Prostate cancer Neg Hx   . Diabetes Neg Hx   . Liver cancer Neg Hx   . Rectal cancer Neg Hx   . Stomach cancer Neg Hx     BP (!) 186/95   Pulse 69   Ht _0  (1.702 m)   Wt 200 lb (90.7 kg)   BMI 31.32 kg/m   Review of Systems: See HPI above.     Objective:  Physical Exam:  Gen: NAD, comfortable in exam room  Left shoulder: No swelling, ecchymoses.  No gross deformity. No TTP. FROM with painful arc. Positive Hawkins, Neers. Negative Yergasons. Strength 5/5 with empty can and resisted internal/external rotation.  Pain empty can > ER. NV intact distally.  Right shoulder: No swelling, ecchymoses.  No gross deformity. No TTP. FROM. Strength 5/5 with empty can and resisted internal/external rotation. NV intact distally.   Assessment & Plan:  1. Left shoulder pain - 2/2 impingement.  Shown home exercises to do daily.  Aleve or ibuprofen for 7-10 days then as needed.  Consider injection, imaging, physical therapy, nitro patches if not improving.

## 2018-07-06 NOTE — Patient Instructions (Signed)
You have rotator cuff impingement Try to avoid painful activities (overhead activities, lifting with extended arm) as much as possible. Aleve 2 tabs twice a day with food OR ibuprofen 3 tabs three times a day with food for pain and inflammation - consider taking for 7-10 days then as needed. Can take tylenol in addition to this. Subacromial injection may be beneficial to help with pain and to decrease inflammation. Consider physical therapy with transition to home exercise program. Do home exercise program with theraband and scapular stabilization exercises daily 3 sets of 10 once a day. If not improving at follow-up we will consider further imaging, injection, physical therapy, and/or nitro patches. Follow up with me in 6 weeks.

## 2018-07-19 DIAGNOSIS — L570 Actinic keratosis: Secondary | ICD-10-CM | POA: Diagnosis not present

## 2018-07-19 DIAGNOSIS — Q828 Other specified congenital malformations of skin: Secondary | ICD-10-CM | POA: Diagnosis not present

## 2018-07-19 DIAGNOSIS — D2239 Melanocytic nevi of other parts of face: Secondary | ICD-10-CM | POA: Diagnosis not present

## 2018-08-24 ENCOUNTER — Ambulatory Visit: Payer: Medicare Other | Admitting: Family Medicine

## 2018-08-28 NOTE — Progress Notes (Addendum)
Subjective:   Renee [name] is a 73 y.o. female who presents for Medicare Annual (Subsequent) preventive examination.  She enjoys Tai Chi 3x/week.  Review of Systems: No ROS.  Medicare Wellness Visit. Additional risk factors are reflected in the social history. Cardiac Risk Factors include: advanced age (>75mn, >>27women);dyslipidemia;hypertension;obesity (BMI >30kg/m2) Sleep patterns: sleep varies Home Safety/Smoke Alarms: Feels safe in home. Smoke alarms in place. Good neighbors Lives with husband in 2 story home. Handrails on stairs.     Female:      Mammo- ordered       Dexa scan- ordrered [facility]- last 01/17/18. 5 yr recall  Eye- wears readers. Dr.Daughtry yrly.    Objective:     Vitals: BP (!) 160/98 (BP Location: Right Arm, Cuff Size: Normal)   Pulse 78   Ht _0  (1.702 m)   Wt 204 lb (92.5 kg)   SpO2 96%   BMI 31.95 kg/m   Body mass index is 31.95 kg/m.   Wt Readings from Last 3 Encounters:  08/29/18 204 lb (92.5 kg)  07/06/18 200 lb (90.7 kg)  06/29/18 202 lb (91.6 kg)   Temp Readings from Last 3 Encounters:  06/29/18 98.2 F (36.8 C) (Oral)  03/07/18 98.4 F (36.9 C) (Oral)  01/17/18 98.4 F (36.9 C)   BP Readings from Last 3 Encounters:  08/29/18 (!) 160/98  07/06/18 (!) 186/95  06/29/18 134/84   Pulse Readings from Last 3 Encounters:  08/29/18 78  07/06/18 69  06/29/18 77    Advanced Directives 08/29/2018 01/17/2018 02/09/2017 02/09/2017 02/02/2017 10/07/2016 10/03/2015  Does Patient Have a Medical Advance Directive? Yes No _1   Type of AParamedicof [place] ManorLiving will - [place]Living will [place] LakesLiving will [place]Living will  Does patient want to make changes to medical advance directive? No - Patient declined - No - Patient declined - - No - Patient declined -  Copy of [place] Chart? Yes -  validated most recent copy scanned in chart (See row information) - Yes No - copy requested No - copy requested No - copy requested No - copy requested  Would patient like information on creating a medical advance directive? - No - Patient declined - - - - -    Tobacco Social History   Tobacco Use  Smoking Status Former Smoker  . Packs/day: 0.50  . Types: Cigarettes  . Last attempt to quit: 09/07/2015  . Years since quitting: 2.9  Smokeless Tobacco Never Used     Counseling given: Not Answered   Clinical Intake: Pain : No/denies pain   Past Medical History:  Diagnosis Date  . Abdominal pain 07/05/2017  . ABNORMAL INVOLUNTARY MOVEMENTS 01/01/2009   Qualifier: Diagnosis of  By: HLinna DarnerMD, WGwyndolyn Saxon   . Allergic rhinitis   . Allergic rhinitis 09/19/2008   Qualifier: Diagnosis of  By: YWynona Luna  . Allergy   . Cervical cancer screening 05/08/2013   Menarche 11, regular Menopause 531sG2P2 s/p 2 svd No abnormal paps or MGM Fatty cyst on labia removed.   . Cervical pain (neck) 10/07/2015  . CHEST PAIN 09/19/2008   Qualifier: Diagnosis of  By: YWynona Luna  . 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 Concussion 1969  . Diverticulosis 05/06/2017  .  Essential hypertension 09/19/2008   Qualifier: Diagnosis of  By: Wynona Luna   . GERD (gastroesophageal reflux disease)   . H/O osteopenia 02/08/2016  . H/O tobacco use, presenting hazards to health 09/19/2008   Qualifier: Diagnosis of  By: Wynona Luna Smoking roughly 5 cig daily  Last cigarette was in December of 2016  . Head tremor 12/27/2016  . 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 Hx of adenomatous colonic polyps 01/27/2018  . Hyperglycemia 10/07/2016  . Hyperlipidemia   . Hyperlipidemia, mixed 10/03/2008   Qualifier: Diagnosis of  By: Wynona Luna   . Hypertension   . 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 Obesity 07/06/2016  . Occult blood positive stool   . Polyp of sigmoid colon 12/08/2016   11/2016 - Unable to remove or get bx at colonoscopy. Fixed sigmoid colon prevents. Referred for surgery.  Marland Kitchen PONV (postoperative nausea and vomiting)    ,   . Preventative health care 05/12/2013  . 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 Tobacco abuse   . Tremor, essential   . TREMOR, ESSENTIAL 09/19/2008   Qualifier: Diagnosis of  By: Wynona Luna   . Vertigo   . Vertigo    Past Surgical History:  Procedure Laterality Date  . INNER EAR SURGERY  2015   with tube  . MOUTH SURGERY  01/22/11   lower denture secured by titanium bolts  . rectal suction biospy    . XI ROBOTIC ASSISTED LOWER ANTERIOR RESECTION N/A 02/09/2017   Procedure: XI ROBOTIC RESECTION OF SIGMOID COLON, RIGID PROCTOSCOPY;  Surgeon: Michael Boston, MD;  Location: [facility];  Service: General;  Laterality: N/A;   Family History  Problem Relation Age of Onset  . Heart attack Father 60       deceased  . Stroke Mother   . Hypertension Mother   . Alcohol abuse Mother   . Obesity Daughter   . Heart disease Paternal Grandmother        AAA rupture  . Heart disease Paternal Grandfather        MI  . Colon cancer Neg Hx   . Breast cancer Neg Hx   . Prostate cancer Neg Hx   . Diabetes Neg Hx   . Liver cancer Neg Hx   . Rectal cancer Neg Hx   . Stomach cancer Neg Hx    Social History   Socioeconomic History  . Marital status: Married    Spouse name: Not on file  . Number of children: Not on file  . Years of education: Not on file  . Highest education level: Not on file  Occupational History  . Not on file  Social Needs  . Financial resource strain: Not on file  . Food insecurity:    Worry: Not on file    Inability: Not on file  . Transportation needs:    Medical: Not on file    Non-medical: Not on file  Tobacco Use  . Smoking status: Former Smoker    Packs/day:  0.50    Types: Cigarettes    Last attempt to quit: 09/07/2015    Years since quitting: 2.9  . Smokeless tobacco: Never Used  Substance and Sexual Activity  . Alcohol use: Yes    Alcohol/week: 14.0 standard drinks    Types: 14 Glasses  of wine per week    Comment: 3 glasses of wine daily   . Drug use: No  . Sexual activity: Yes  Lifestyle  . Physical activity:    Days per week: Not on file    Minutes per session: Not on file  . Stress: Not on file  Relationships  . Social connections:    Talks on phone: Not on file    Gets together: Not on file    Attends religious service: Not on file    Active member of club or organization: Not on file    Attends meetings of clubs or organizations: Not on file    Relationship status: Not on file  Other Topics Concern  . Not on file  Social History Narrative  . Not on file    Outpatient Encounter Medications as of 08/29/2018  Medication Sig  . atorvastatin (LIPITOR) 10 MG tablet Take 0.5 tablets (5 mg total) by mouth every other day.  . b complex vitamins tablet Take 1 tablet by mouth every other day.  . Cholecalciferol (VITAMIN D PO) Take 1 tablet by mouth every other day. 3 times per week  . Coenzyme Q10 200 MG capsule Take 200 mg by mouth every other day. 3 times per week  . metoprolol succinate (TOPROL-XL) 25 MG 24 hr tablet Take 0.5 tablets (12.5 mg total) by mouth daily.  . Multiple Vitamin (MULTIVITAMIN WITH MINERALS) TABS tablet Take 1 tablet by mouth every other day.  . ranitidine (ZANTAC) 300 MG capsule Take 1 capsule (300 mg total) as needed by mouth for heartburn.   No facility-administered encounter medications on file as of 08/29/2018.     Activities of Daily Living In your present state of health, do you have any difficulty performing the following activities: 08/29/2018  Hearing? N  Vision? N  Difficulty concentrating or making decisions? N  Walking or climbing stairs? N  Dressing or bathing? N  Doing errands, shopping? N    Preparing Food and eating ? N  Using the Toilet? N  In the past six months, have you accidently leaked urine? Y  Do you have problems with loss of bowel control? N  Managing your Medications? N  Managing your Finances? N  Housekeeping or managing your Housekeeping? N  Some recent data might be hidden    Patient Care Team: [name], MD as PCP - General (Family Medicine) Melissa Montane, MD as Consulting Physician (Otolaryngology) Gatha Mayer, MD as Consulting Physician (Gastroenterology) Dr Shanon Brow Daughtry (Optometry) Michael Boston, MD as Consulting Physician (General Surgery) Love, Alyson Locket, MD as Consulting Physician (Neurology)    Assessment:   This is a routine wellness examination for [place]. Physical assessment deferred to PCP.  Exercise Activities and Dietary recommendations Current Exercise Habits: Structured exercise class, Type of exercise: treadmill;strength training/weights(tai chi), Time (Minutes): 40, Frequency (Times/Week): 3, Weekly Exercise (Minutes/Week): 120, Intensity: Mild, Exercise limited by: None identified   Diet (meal preparation, eat out, water intake, caffeinated beverages, dairy products, fruits and vegetables): 24 hr recall Breakfast: 2c of coffee and eggs or cereal Lunch:skips Dinner: fried chicken, mac n cheese and broccoli 3 glasses of wine  Goals    . Increase water intake    . Reduce alcohol intake     . Reduce sodium intake     DASH Eating Plan DASH stands for "Dietary Approaches to Stop Hypertension." The DASH eating plan is a healthy eating plan that has been shown to reduce high blood pressure (hypertension).  Additional health benefits may include reducing the risk of type 2 diabetes mellitus, heart disease, and stroke. The DASH eating plan may also help with weight loss. What do I need to know about the DASH eating plan? For the DASH eating plan, you will follow these general guidelines:  Choose foods with less than 150 milligrams  of sodium per serving (as listed on the food label).  Use salt-free seasonings or herbs instead of table salt or sea salt.  Check with your health care provider or pharmacist before using salt substitutes.  Eat lower-sodium products. These are often labeled as "low-sodium" or "no salt added."  Eat fresh foods. Avoid eating a lot of canned foods.  Eat more vegetables, fruits, and low-fat dairy products.  Choose whole grains. Look for the word "whole" as the first word in the ingredient list.  Choose fish and skinless chicken or [place] more often than red meat. Limit fish, poultry, and meat to 6 oz (170 g) each day.  Limit sweets, desserts, sugars, and sugary drinks.  Choose heart-healthy fats.  Eat more home-cooked food and less restaurant, buffet, and fast food.  Limit fried foods.  Do not fry foods. Cook foods using methods such as baking, boiling, grilling, and broiling instead.  When eating at a restaurant, ask that your food be prepared with less salt, or no salt if possible. What foods can I eat? Seek help from a dietitian for individual calorie needs. Grains  Whole grain or whole wheat bread. Brown rice. Whole grain or whole wheat pasta. Quinoa, bulgur, and whole grain cereals. Low-sodium cereals. Corn or whole wheat flour tortillas. Whole grain cornbread. Whole grain crackers. Low-sodium crackers. Vegetables  Fresh or frozen vegetables (raw, steamed, roasted, or grilled). Low-sodium or reduced-sodium tomato and vegetable juices. Low-sodium or reduced-sodium tomato sauce and paste. Low-sodium or reduced-sodium canned vegetables. Fruits  All fresh, canned (in natural juice), or frozen fruits. Meat and Other Protein Products  Ground beef (85% or leaner), grass-fed beef, or beef trimmed of fat. Skinless chicken or [place]. Ground chicken or [place]. Pork trimmed of fat. All fish and seafood. Eggs. Dried beans, peas, or lentils. Unsalted nuts and seeds. Unsalted canned  beans. Dairy  Low-fat dairy products, such as skim or 1% milk, 2% or reduced-fat cheeses, low-fat ricotta or cottage cheese, or plain low-fat yogurt. Low-sodium or reduced-sodium cheeses. Fats and Oils  Tub margarines without trans fats. Light or reduced-fat mayonnaise and salad dressings (reduced sodium). Avocado. Safflower, olive, or canola oils. Natural peanut or almond butter. Other  Unsalted popcorn and pretzels. The items listed above may not be a complete list of recommended foods or beverages. Contact your dietitian for more options.  What foods are not recommended? Grains  White bread. White pasta. White rice. Refined cornbread. Bagels and croissants. Crackers that contain trans fat. Vegetables  Creamed or fried vegetables. Vegetables in a cheese sauce. Regular canned vegetables. Regular canned tomato sauce and paste. Regular tomato and vegetable juices. Fruits  Canned fruit in light or heavy syrup. Fruit juice. Meat and Other Protein Products  Fatty cuts of meat. Ribs, chicken wings, bacon, sausage, bologna, salami, chitterlings, fatback, hot dogs, bratwurst, and packaged luncheon meats. Salted nuts and seeds. Canned beans with salt. Dairy  Whole or 2% milk, cream, half-and-half, and cream cheese. Whole-fat or sweetened yogurt. Full-fat cheeses or blue cheese. Nondairy creamers and whipped toppings. Processed cheese, cheese spreads, or cheese curds. Condiments  Onion and garlic salt, seasoned salt, table salt, and sea salt. Canned  and packaged gravies. Worcestershire sauce. Tartar sauce. Barbecue sauce. Teriyaki sauce. Soy sauce, including reduced sodium. Steak sauce. Fish sauce. Oyster sauce. Cocktail sauce. Horseradish. Ketchup and mustard. Meat flavorings and tenderizers. Bouillon cubes. Hot sauce. Tabasco sauce. Marinades. Taco seasonings. Relishes. Fats and Oils  Butter, stick margarine, lard, shortening, ghee, and bacon fat. Coconut, palm kernel, or palm oils. Regular salad  dressings. Other  Pickles and olives. Salted popcorn and pretzels. The items listed above may not be a complete list of foods and beverages to avoid. Contact your dietitian for more information.  Where can I find more information? National Heart, Lung, and Blood Institute: travelstabloid.com This information is not intended to replace advice given to you by your health care provider. Make sure you discuss any questions you have with your health care provider. Document Released: 07/29/2011 Document Revised: 01/15/2016 Document Reviewed: 06/13/2013 Elsevier Interactive Patient Education  2017 [employer] Risk  08/29/2018 06/29/2018 10/07/2016 02/18/2016 10/03/2015  Falls in the past year? 0 1 Yes No No  Number falls in past yr: - 0 1 - -  Injury with Fall? - 1 No - -  Risk for fall due to : - - History of fall(s) - -  Follow up - - Education provided;Falls prevention discussed - -   Depression Screen PHQ 2/9 Scores 08/29/2018 06/29/2018 10/07/2016 02/18/2016  PHQ - 2 Score 0 0 0 0  PHQ- 9 Score - 0 - -     Cognitive Function Ad8 score reviewed for issues:  Issues making decisions:no  Less interest in hobbies / activities:no  Repeats questions, stories (family complaining):no  Trouble using ordinary gadgets (microwave, computer, phone):no  Forgets the month or year: no  Mismanaging finances: no  Remembering appts:no  Daily problems with thinking and/or memory:no Ad8 score is=0     MMSE - Mini Mental State Exam 10/07/2016 10/03/2015  Orientation to time 5 5  Orientation to Place 5 5  Registration 3 3  Attention/ Calculation 5 5  Recall 3 3  Language- name 2 objects 2 2  Language- repeat 1 1  Language- follow 3 step command 3 3  Language- read & follow direction 1 1  Write a sentence 1 1  Copy design 1 1  Total score 30 30        Immunization History  Administered Date(s) Administered  . Pneumococcal  Conjugate-13 07/06/2016     Screening Tests Health Maintenance  Topic Date Due  . Hepatitis C Screening  26-Jun-1946  . TETANUS/TDAP  01/30/1965  . PNA vac Low Risk Adult (2 of 2 - PPSV23) 07/06/2017  . INFLUENZA VACCINE  06/25/2019 (Originally 03/23/2018)  . MAMMOGRAM  09/02/2018  . COLONOSCOPY  01/18/2023  . DEXA SCAN  Completed       Plan:    Please schedule your next medicare wellness visit with me in 1 yr.  Continue to eat heart healthy diet (full of fruits, vegetables, whole grains, lean protein, water--limit salt, fat, and sugar intake) and increase physical activity as tolerated.  Continue doing brain stimulating activities (puzzles, reading, adult coloring books, staying active) to keep memory sharp.   I have ordered your mammogram and bone density scan. Please schedule.  Your BP is elevated today. Per Dr.Blyth--take a whole Metoprolol tab daily (9m total) and return to see Dr.Blyth in 2 weeks.   Increase your water to at least 60 ounces per day.  I have personally reviewed and noted the  following in the patient's chart:   . Medical and social history . Use of alcohol, tobacco or illicit drugs  . Current medications and supplements . Functional ability and status . Nutritional status . Physical activity . Advanced directives . List of other physicians . Hospitalizations, surgeries, and ER visits in previous 12 months . Vitals . Screenings to include cognitive, depression, and falls . Referrals and appointments  In addition, I have reviewed and discussed with patient certain preventive protocols, quality metrics, and best practice recommendations. A written personalized care plan for preventive services as well as general preventive health recommendations were provided to patient.     Shela Nevin, [place]  08/29/2018  Medical screening examination/treatment was performed by qualified clinical staff member and as supervising physician I was immediately  available for consultation/collaboration. I have reviewed documentation and agree with assessment and plan.  Penni Homans, MD

## 2018-08-29 ENCOUNTER — Ambulatory Visit (INDEPENDENT_AMBULATORY_CARE_PROVIDER_SITE_OTHER): Payer: Medicare Other | Admitting: *Deleted

## 2018-08-29 ENCOUNTER — Encounter: Payer: Self-pay | Admitting: *Deleted

## 2018-08-29 VITALS — BP 160/98 | HR 78 | Ht 67.0 in | Wt 204.0 lb

## 2018-08-29 DIAGNOSIS — Z0000 Encounter for general adult medical examination without abnormal findings: Secondary | ICD-10-CM

## 2018-08-29 DIAGNOSIS — Z780 Asymptomatic menopausal state: Secondary | ICD-10-CM

## 2018-08-29 DIAGNOSIS — Z1239 Encounter for other screening for malignant neoplasm of breast: Secondary | ICD-10-CM

## 2018-08-29 NOTE — Patient Instructions (Signed)
Please schedule your next medicare wellness visit with me in 1 yr.  Continue to eat heart healthy diet (full of fruits, vegetables, whole grains, lean protein, water--limit salt, fat, and sugar intake) and increase physical activity as tolerated.  Continue doing brain stimulating activities (puzzles, reading, adult coloring books, staying active) to keep memory sharp.   I have ordered your mammogram and bone density scan. Please schedule.  Your BP is elevated today. Per Dr.Blyth--take a whole Metoprolol tab daily (25mg  total) and return to see Dr.Blyth in 2 weeks.   Increase your water to at least 60 ounces per day.   Renee [name] , Thank you for taking time to come for your Medicare Wellness Visit. I appreciate your ongoing commitment to your health goals. Please review the following plan we discussed and let me know if I can assist you in the future.   These are the goals we discussed: Goals    . Increase water intake    . Reduce alcohol intake     . Reduce sodium intake     DASH Eating Plan DASH stands for "Dietary Approaches to Stop Hypertension." The DASH eating plan is a healthy eating plan that has been shown to reduce high blood pressure (hypertension). Additional health benefits may include reducing the risk of type 2 diabetes mellitus, heart disease, and stroke. The DASH eating plan may also help with weight loss. What do I need to know about the DASH eating plan? For the DASH eating plan, you will follow these general guidelines:  Choose foods with less than 150 milligrams of sodium per serving (as listed on the food label).  Use salt-free seasonings or herbs instead of table salt or sea salt.  Check with your health care provider or pharmacist before using salt substitutes.  Eat lower-sodium products. These are often labeled as "low-sodium" or "no salt added."  Eat fresh foods. Avoid eating a lot of canned foods.  Eat more vegetables, fruits, and low-fat dairy  products.  Choose whole grains. Look for the word "whole" as the first word in the ingredient list.  Choose fish and skinless chicken or [place] more often than red meat. Limit fish, poultry, and meat to 6 oz (170 g) each day.  Limit sweets, desserts, sugars, and sugary drinks.  Choose heart-healthy fats.  Eat more home-cooked food and less restaurant, buffet, and fast food.  Limit fried foods.  Do not fry foods. Cook foods using methods such as baking, boiling, grilling, and broiling instead.  When eating at a restaurant, ask that your food be prepared with less salt, or no salt if possible. What foods can I eat? Seek help from a dietitian for individual calorie needs. Grains  Whole grain or whole wheat bread. Brown rice. Whole grain or whole wheat pasta. Quinoa, bulgur, and whole grain cereals. Low-sodium cereals. Corn or whole wheat flour tortillas. Whole grain cornbread. Whole grain crackers. Low-sodium crackers. Vegetables  Fresh or frozen vegetables (raw, steamed, roasted, or grilled). Low-sodium or reduced-sodium tomato and vegetable juices. Low-sodium or reduced-sodium tomato sauce and paste. Low-sodium or reduced-sodium canned vegetables. Fruits  All fresh, canned (in natural juice), or frozen fruits. Meat and Other Protein Products  Ground beef (85% or leaner), grass-fed beef, or beef trimmed of fat. Skinless chicken or [place]. Ground chicken or [place]. Pork trimmed of fat. All fish and seafood. Eggs. Dried beans, peas, or lentils. Unsalted nuts and seeds. Unsalted canned beans. Dairy  Low-fat dairy products, such as skim or 1% milk,  2% or reduced-fat cheeses, low-fat ricotta or cottage cheese, or plain low-fat yogurt. Low-sodium or reduced-sodium cheeses. Fats and Oils  Tub margarines without trans fats. Light or reduced-fat mayonnaise and salad dressings (reduced sodium). Avocado. Safflower, olive, or canola oils. Natural peanut or almond butter. Other  Unsalted popcorn and  pretzels. The items listed above may not be a complete list of recommended foods or beverages. Contact your dietitian for more options.  What foods are not recommended? Grains  White bread. White pasta. White rice. Refined cornbread. Bagels and croissants. Crackers that contain trans fat. Vegetables  Creamed or fried vegetables. Vegetables in a cheese sauce. Regular canned vegetables. Regular canned tomato sauce and paste. Regular tomato and vegetable juices. Fruits  Canned fruit in light or heavy syrup. Fruit juice. Meat and Other Protein Products  Fatty cuts of meat. Ribs, chicken wings, bacon, sausage, bologna, salami, chitterlings, fatback, hot dogs, bratwurst, and packaged luncheon meats. Salted nuts and seeds. Canned beans with salt. Dairy  Whole or 2% milk, cream, half-and-half, and cream cheese. Whole-fat or sweetened yogurt. Full-fat cheeses or blue cheese. Nondairy creamers and whipped toppings. Processed cheese, cheese spreads, or cheese curds. Condiments  Onion and garlic salt, seasoned salt, table salt, and sea salt. Canned and packaged gravies. Worcestershire sauce. Tartar sauce. Barbecue sauce. Teriyaki sauce. Soy sauce, including reduced sodium. Steak sauce. Fish sauce. Oyster sauce. Cocktail sauce. Horseradish. Ketchup and mustard. Meat flavorings and tenderizers. Bouillon cubes. Hot sauce. Tabasco sauce. Marinades. Taco seasonings. Relishes. Fats and Oils  Butter, stick margarine, lard, shortening, ghee, and bacon fat. Coconut, palm kernel, or palm oils. Regular salad dressings. Other  Pickles and olives. Salted popcorn and pretzels. The items listed above may not be a complete list of foods and beverages to avoid. Contact your dietitian for more information.  Where can I find more information? National Heart, Lung, and Blood Institute: travelstabloid.com This information is not intended to replace advice given to you by your health care  provider. Make sure you discuss any questions you have with your health care provider. Document Released: 07/29/2011 Document Revised: 01/15/2016 Document Reviewed: 06/13/2013 Elsevier Interactive Patient Education  2017 [employer].        This is a list of the screening recommended for you and due dates:  Health Maintenance  Topic Date Due  .  Hepatitis C: One time screening is recommended by Center for Disease Control  (CDC) for  adults born from [birth year] through 1965.   [birth year]  . Tetanus Vaccine  [birth year]/1965  . Pneumonia vaccines (2 of 2 - PPSV23) 07/06/2017  . Flu Shot  06/25/2019*  . Mammogram  09/02/2018  . Colon Cancer Screening  01/18/2023  . DEXA scan (bone density measurement)  Completed  *Topic was postponed. The date shown is not the original due date.     Health Maintenance After Age 72 After age 43, you are at a higher risk for certain long-term diseases and infections as well as injuries from falls. Falls are a major cause of broken bones and head injuries in people who are older than age 71. Getting regular preventive care can help to keep you healthy and well. Preventive care includes getting regular testing and making lifestyle changes as recommended by your health care provider. Talk with your health care provider about:  Which screenings and tests you should have. A screening is a test that checks for a disease when you have no symptoms.  A diet and exercise plan that is right for you.  What should I know about screenings and tests to prevent falls? Screening and testing are the best ways to find a health problem early. Early diagnosis and treatment give you the best chance of managing medical conditions that are common after age 55. Certain conditions and lifestyle choices may make you more likely to have a fall. Your health care provider may recommend:  Regular vision checks. Poor vision and conditions such as cataracts can make you more likely to have a fall. If  you wear glasses, make sure to get your prescription updated if your vision changes.  Medicine review. Work with your health care provider to regularly review all of the medicines you are taking, including over-the-counter medicines. Ask your health care provider about any side effects that may make you more likely to have a fall. Tell your health care provider if any medicines that you take make you feel dizzy or sleepy.  Osteoporosis screening. Osteoporosis is a condition that causes the bones to get weaker. This can make the bones weak and cause them to break more easily.  Blood pressure screening. Blood pressure changes and medicines to control blood pressure can make you feel dizzy.  Strength and balance checks. Your health care provider may recommend certain tests to check your strength and balance while standing, walking, or changing positions.  Foot health exam. Foot pain and numbness, as well as not wearing proper footwear, can make you more likely to have a fall.  Depression screening. You may be more likely to have a fall if you have a fear of falling, feel emotionally low, or feel unable to do activities that you used to do.  Alcohol use screening. Using too much alcohol can affect your balance and may make you more likely to have a fall. What actions can I take to lower my risk of falls? General instructions  Talk with your health care provider about your risks for falling. Tell your health care provider if: ? You fall. Be sure to tell your health care provider about all falls, even ones that seem minor. ? You feel dizzy, sleepy, or off-balance.  Take over-the-counter and prescription medicines only as told by your health care provider. These include any supplements.  Eat a healthy diet and maintain a healthy weight. A healthy diet includes low-fat dairy products, low-fat (lean) meats, and fiber from whole grains, beans, and lots of fruits and vegetables. Home safety  Remove  any tripping hazards, such as rugs, cords, and clutter.  Install safety equipment such as grab bars in bathrooms and safety rails on stairs.  Keep rooms and walkways well-lit. Activity   Follow a regular exercise program to stay fit. This will help you maintain your balance. Ask your health care provider what types of exercise are appropriate for you.  If you need a cane or walker, use it as recommended by your health care provider.  Wear supportive shoes that have nonskid soles. Lifestyle  Do not drink alcohol if your health care provider tells you not to drink.  If you drink alcohol, limit how much you have: ? 0-1 drink a day for women. ? 0-2 drinks a day for men.  Be aware of how much alcohol is in your drink. In the [place] one drink equals one typical bottle of beer (12 oz), one-half glass of wine (5 oz), or one shot of hard liquor (1 oz).  Do not use any products that contain nicotine or tobacco, such as cigarettes and e-cigarettes. If you  need help quitting, ask your health care provider. Summary  Having a healthy lifestyle and getting preventive care can help to protect your health and wellness after age 8.  Screening and testing are the best way to find a health problem early and help you avoid having a fall. Early diagnosis and treatment give you the best chance for managing medical conditions that are more common for people who are older than age 78.  Falls are a major cause of broken bones and head injuries in people who are older than age 57. Take precautions to prevent a fall at home.  Work with your health care provider to learn what changes you can make to improve your health and wellness and to prevent falls. This information is not intended to replace advice given to you by your health care provider. Make sure you discuss any questions you have with your health care provider. Document Released: 06/22/2017 Document Revised: 06/22/2017 Document Reviewed:  06/22/2017 Elsevier Interactive Patient Education  2019 [employer].

## 2018-09-06 ENCOUNTER — Encounter: Payer: Self-pay | Admitting: Family Medicine

## 2018-09-14 ENCOUNTER — Other Ambulatory Visit: Payer: Self-pay | Admitting: Family Medicine

## 2018-09-14 ENCOUNTER — Ambulatory Visit (INDEPENDENT_AMBULATORY_CARE_PROVIDER_SITE_OTHER): Payer: Medicare Other | Admitting: Family Medicine

## 2018-09-14 VITALS — BP 134/74 | HR 72 | Temp 98.2°F | Resp 18 | Ht 67.0 in | Wt 205.4 lb

## 2018-09-14 DIAGNOSIS — I10 Essential (primary) hypertension: Secondary | ICD-10-CM

## 2018-09-14 DIAGNOSIS — L309 Dermatitis, unspecified: Secondary | ICD-10-CM

## 2018-09-14 DIAGNOSIS — R739 Hyperglycemia, unspecified: Secondary | ICD-10-CM

## 2018-09-14 DIAGNOSIS — E785 Hyperlipidemia, unspecified: Secondary | ICD-10-CM | POA: Diagnosis not present

## 2018-09-14 DIAGNOSIS — Z9622 Myringotomy tube(s) status: Secondary | ICD-10-CM

## 2018-09-14 DIAGNOSIS — R42 Dizziness and giddiness: Secondary | ICD-10-CM

## 2018-09-14 DIAGNOSIS — H9191 Unspecified hearing loss, right ear: Secondary | ICD-10-CM | POA: Diagnosis not present

## 2018-09-14 MED ORDER — FLUCONAZOLE 150 MG PO TABS: ORAL_TABLET | ORAL | 1 refills | Status: DC

## 2018-09-14 MED ORDER — METHYLPREDNISOLONE 4 MG PO TABS: ORAL_TABLET | ORAL | 0 refills | Status: DC

## 2018-09-14 MED ORDER — FAMOTIDINE 20 MG PO TABS: 20.0000 mg | ORAL_TABLET | Freq: Two times a day (BID) | ORAL | 3 refills | Status: DC

## 2018-09-14 MED ORDER — CETIRIZINE HCL 10 MG PO TABS: 10.0000 mg | ORAL_TABLET | Freq: Two times a day (BID) | ORAL | 5 refills | Status: DC

## 2018-09-14 NOTE — Patient Instructions (Addendum)
Cetirizine twice daily and Famotidine twice daily once better drop Cetrizine and then Famotidine as needed  On the days you take Fluconazole do not take the Atorvastatin   Contact Dermatitis Dermatitis is redness, soreness, and swelling (inflammation) of the skin. Contact dermatitis is a reaction to certain substances that touch the skin. Many different substances can cause contact dermatitis. There are two types of contact dermatitis:  Irritant contact dermatitis. This type is caused by something that irritates your skin, such as having dry hands from washing them too often with soap. This type does not require previous exposure to the substance for a reaction to occur. This is the most common type.  Allergic contact dermatitis. This type is caused by a substance that you are allergic to, such as poison ivy. This type occurs when you have been exposed to the substance (allergen) and develop a sensitivity to it. Dermatitis may develop soon after your first exposure to the allergen, or it may not develop until the next time you are exposed and every time thereafter. What are the causes? Irritant contact dermatitis is most commonly caused by exposure to:  Makeup.  Soaps.  Detergents.  Bleaches.  Acids.  Metal salts, such as nickel. Allergic contact dermatitis is most commonly caused by exposure to:  Poisonous plants.  Chemicals.  Jewelry.  Latex.  Medicines.  Preservatives in products, such as clothing. What increases the risk? You are more likely to develop this condition if you have:  A job that exposes you to irritants or allergens.  Certain medical conditions, such as asthma or eczema. What are the signs or symptoms? Symptoms of this condition may occur on your body anywhere the irritant has touched you or is touched by you.  Symptoms include: ? Dryness or flaking. ? Redness. ? Cracks. ? Itching. ? Pain or a burning feeling. ? Blisters. ? Drainage of small  amounts of blood or clear fluid from skin cracks. With allergic contact dermatitis, there may also be swelling in areas such as the eyelids, mouth, or genitals. How is this diagnosed? This condition is diagnosed with a medical history and physical exam.  A patch skin test may be performed to help determine the cause.  If the condition is related to your job, you may need to see an occupational medicine specialist. How is this treated? This condition is treated by checking for the cause of the reaction and protecting your skin from further contact. Treatment may also include:  Steroid creams or ointments. Oral steroid medicines may be needed in more severe cases.  Antibiotic medicines or antibacterial ointments, if a skin infection is present.  Antihistamine lotion or an antihistamine taken by mouth to ease itching.  A bandage (dressing). Follow these instructions at home: Skin care  Moisturize your skin as needed.  Apply cool compresses to the affected areas.  Try applying baking soda paste to your skin. Stir water into baking soda until it reaches a paste-like consistency.  Do not scratch your skin, and avoid friction to the affected area.  Avoid the use of soaps, perfumes, and dyes. Medicines  Take or apply over-the-counter and prescription medicines only as told by your health care provider.  If you were prescribed an antibiotic medicine, take or apply the antibiotic as told by your health care provider. Do not stop using the antibiotic even if your condition improves. Bathing  Try taking a bath with: ? Epsom salts. Follow the instructions on the packaging. You can get these at your  local pharmacy or grocery store. ? Baking soda. Pour a small amount into the bath as directed by your health care provider. ? Colloidal oatmeal. Follow the instructions on the packaging. You can get this at your local pharmacy or grocery store.  Bathe less frequently, such as every other  day.  Bathe in lukewarm water. Avoid using hot water. Bandage care  If you were given a bandage (dressing), change it as told by your health care provider.  Wash your hands with soap and water before and after you change your dressing. If soap and water are not available, use hand sanitizer. General instructions  Avoid the substance that caused your reaction. If you do not know what caused it, keep a journal to try to track what caused it. Write down: ? What you eat. ? What cosmetic products you use. ? What you drink. ? What you wear in the affected area. This includes jewelry.  Check the affected areas every day for signs of infection. Check for: ? More redness, swelling, or pain. ? More fluid or blood. ? Warmth. ? Pus or a bad smell.  Keep all follow-up visits as told by your health care provider. This is important. Contact a health care provider if:  Your condition does not improve with treatment.  Your condition gets worse.  You have signs of infection such as swelling, tenderness, redness, soreness, or warmth in the affected area.  You have a fever.  You have new symptoms. Get help right away if:  You have a severe headache, neck pain, or neck stiffness.  You vomit.  You feel very sleepy.  You notice red streaks coming from the affected area.  Your bone or joint underneath the affected area becomes painful after the skin has healed.  The affected area turns darker.  You have difficulty breathing. Summary  Dermatitis is redness, soreness, and swelling (inflammation) of the skin. Contact dermatitis is a reaction to certain substances that touch the skin.  Symptoms of this condition may occur on your body anywhere the irritant has touched you or is touched by you.  This condition is treated by figuring out what caused the reaction and protecting your skin from further contact. Treatment may also include medicines and skin care.  Avoid the substance that  caused your reaction. If you do not know what caused it, keep a journal to try to track what caused it.  Contact a health care provider if your condition gets worse or you have signs of infection such as swelling, tenderness, redness, soreness, or warmth in the affected area. This information is not intended to replace advice given to you by your health care provider. Make sure you discuss any questions you have with your health care provider. Document Released: 08/06/2000 Document Revised: 02/22/2018 Document Reviewed: 02/22/2018 Elsevier Interactive Patient Education  2019 [employer].

## 2018-09-17 DIAGNOSIS — L309 Dermatitis, unspecified: Secondary | ICD-10-CM

## 2018-09-17 DIAGNOSIS — Z9622 Myringotomy tube(s) status: Secondary | ICD-10-CM | POA: Insufficient documentation

## 2018-09-17 HISTORY — DX: Dermatitis, unspecified: L30.9

## 2018-09-17 HISTORY — DX: Myringotomy tube(s) status: Z96.22

## 2018-09-17 NOTE — Assessment & Plan Note (Signed)
Encouraged heart healthy diet, increase exercise, avoid trans fats, consider a krill oil cap daily 

## 2018-09-17 NOTE — Assessment & Plan Note (Signed)
Right ear with intermittent discomfort, disequilibrium, hearing loss. Referred to ENT for evaluation.

## 2018-09-17 NOTE — Assessment & Plan Note (Signed)
hgba1c acceptable, minimize simple carbs. Increase exercise as tolerated.  

## 2018-09-17 NOTE — Assessment & Plan Note (Signed)
Well controlled, no changes to meds. Encouraged heart healthy diet such as the DASH diet and exercise as tolerated.  °

## 2018-09-17 NOTE — Assessment & Plan Note (Addendum)
startedon Medrol dosepak, Zyrtec and Famotidine.

## 2018-09-17 NOTE — Progress Notes (Signed)
Subjective:    Patient ID: Renee [name], female    DOB: [birth year], 73 y.o.   MRN: [id number]  No chief complaint on file.   HPI Patient is in today for follow up. She feels well today but does note persistent trouble with a tympanostomy tube in right ear, tinnitus, hearing loss, ear pain intermittently. No discharge or fevers. No headaches or falls. Symptoms have been present off and on for years. She is ready to have it further investigates. Denies CP/palp/SOB/HA/congestion/fevers/GI or GU c/o. Taking meds as prescribed  Past Medical History:  Diagnosis Date  . Abdominal pain 07/05/2017  . ABNORMAL INVOLUNTARY MOVEMENTS 01/01/2009   Qualifier: Diagnosis of  By: Linna Darner MD, Gwyndolyn Saxon    . Allergic rhinitis   . Allergic rhinitis 09/19/2008   Qualifier: Diagnosis of  By: Wynona Luna   . Allergy   . Cervical cancer screening 05/08/2013   Menarche 11, regular Menopause 63s G2P2 s/p 2 svd No abnormal paps or MGM Fatty cyst on labia removed.   . Cervical pain (neck) 10/07/2015  . CHEST PAIN 09/19/2008   Qualifier: Diagnosis of  By: Wynona Luna   . 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 Concussion 1969  . Diverticulosis 05/06/2017  . Essential hypertension 09/19/2008   Qualifier: Diagnosis of  By: Wynona Luna   . GERD (gastroesophageal reflux disease)   . H/O osteopenia 02/08/2016  . H/O tobacco use, presenting hazards to health 09/19/2008   Qualifier: Diagnosis of  By: Wynona Luna Smoking roughly 5 cig daily  Last cigarette was in December of 2016  . Head tremor 12/27/2016  . 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 Hx of adenomatous colonic polyps 01/27/2018  . Hyperglycemia 10/07/2016  . Hyperlipidemia   . Hyperlipidemia, mixed 10/03/2008   Qualifier: Diagnosis of  By: Wynona Luna   . Hypertension   . 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 Obesity 07/06/2016  . Occult blood positive stool   . Polyp of sigmoid colon 12/08/2016   11/2016 - Unable to remove or get bx at colonoscopy. Fixed sigmoid colon prevents. Referred for surgery.  Marland Kitchen PONV (postoperative nausea and vomiting)    ,   . Preventative health care 05/12/2013  . 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 Tobacco abuse   . Tremor, essential   . TREMOR, ESSENTIAL 09/19/2008   Qualifier: Diagnosis of  By: Wynona Luna   . Vertigo   . Vertigo     Past Surgical History:  Procedure Laterality Date  . INNER EAR SURGERY  2015   with tube  . MOUTH SURGERY  01/22/11   lower denture secured by titanium bolts  . rectal suction biospy    . XI ROBOTIC ASSISTED LOWER ANTERIOR RESECTION N/A 02/09/2017   Procedure: XI ROBOTIC RESECTION OF SIGMOID COLON, RIGID PROCTOSCOPY;  Surgeon: Michael Boston, MD;  Location: [facility];  Service: General;  Laterality: N/A;    Family History  Problem Relation Age of Onset  . Heart attack Father 85       deceased  . Stroke Mother   . Hypertension Mother   . Alcohol abuse Mother   . Obesity Daughter   . Heart disease Paternal Grandmother  AAA rupture  . Heart disease Paternal Grandfather        MI  . Colon cancer Neg Hx   . Breast cancer Neg Hx   . Prostate cancer Neg Hx   . Diabetes Neg Hx   . Liver cancer Neg Hx   . Rectal cancer Neg Hx   . Stomach cancer Neg Hx     Social History   Socioeconomic History  . Marital status: Married    Spouse name: Not on file  . Number of children: Not on file  . Years of education: Not on file  . Highest education level: Not on file  Occupational History  . Not on file  Social Needs  . Financial resource strain: Not on file  . Food insecurity:    Worry: Not on file    Inability: Not on file  . Transportation needs:    Medical: Not on file    Non-medical: Not on file  Tobacco Use  . Smoking status: Former Smoker    Packs/day: 0.50     Types: Cigarettes    Last attempt to quit: 09/07/2015    Years since quitting: 3.0  . Smokeless tobacco: Never Used  Substance and Sexual Activity  . Alcohol use: Yes    Alcohol/week: 14.0 standard drinks    Types: 14 Glasses of wine per week    Comment: 3 glasses of wine daily   . Drug use: No  . Sexual activity: Yes  Lifestyle  . Physical activity:    Days per week: Not on file    Minutes per session: Not on file  . Stress: Not on file  Relationships  . Social connections:    Talks on phone: Not on file    Gets together: Not on file    Attends religious service: Not on file    Active member of club or organization: Not on file    Attends meetings of clubs or organizations: Not on file    Relationship status: Not on file  . Intimate partner violence:    Fear of current or ex partner: Not on file    Emotionally abused: Not on file    Physically abused: Not on file    Forced sexual activity: Not on file  Other Topics Concern  . Not on file  Social History Narrative  . Not on file    Outpatient Medications Prior to Visit  Medication Sig Dispense Refill  . atorvastatin (LIPITOR) 10 MG tablet Take 0.5 tablets (5 mg total) by mouth every other day. 15 tablet 5  . b complex vitamins tablet Take 1 tablet by mouth every other day.    . Cholecalciferol (VITAMIN D PO) Take 1 tablet by mouth every other day. 3 times per week    . Coenzyme Q10 200 MG capsule Take 200 mg by mouth every other day. 3 times per week    . metoprolol succinate (TOPROL-XL) 25 MG 24 hr tablet Take 0.5 tablets (12.5 mg total) by mouth daily. 45 tablet 1  . Multiple Vitamin (MULTIVITAMIN WITH MINERALS) TABS tablet Take 1 tablet by mouth every other day.    . ranitidine (ZANTAC) 300 MG capsule Take 1 capsule (300 mg total) as needed by mouth for heartburn. 30 capsule 3   No facility-administered medications prior to visit.     Allergies  Allergen Reactions  . Amoxicillin     REACTION: TROUBLE  BREATHING Has patient had a PCN reaction causing immediate rash, facial/tongue/throat swelling, SOB  or lightheadedness with hypotension:Yes Has patient had a PCN reaction causing severe rash involving mucus membranes or skin necrosis:No Has patient had a PCN reaction that required hospitalization:No Has patient had a PCN reaction occurring within the last 10 years:No If all of the above answers are "NO", then may proceed with Cephalosporin use.   Abe People Hfa [Albuterol] Other (See Comments)    Hypersensitivity, anxious, disoriented  . Penicillins Rash    Has patient had a PCN reaction causing immediate rash, facial/tongue/throat swelling, SOB or lightheadedness with hypotension:unknown Has patient had a PCN reaction causing severe rash involving mucus membranes or skin necrosis:unknown Has patient had a PCN reaction that required hospitalization:no Has patient had a PCN reaction occurring within the last 10 years:no If all of the above answers are "NO", then may proceed with Cephalosporin use.     Review of Systems  Constitutional: Positive for malaise/fatigue. Negative for fever.  HENT: Positive for ear pain, hearing loss and tinnitus. Negative for congestion.   Eyes: Negative for blurred vision.  Respiratory: Negative for shortness of breath.   Cardiovascular: Negative for chest pain, palpitations and leg swelling.  Gastrointestinal: Negative for abdominal pain, blood in stool and nausea.  Genitourinary: Negative for dysuria and frequency.  Musculoskeletal: Negative for falls.  Skin: Positive for rash. Negative for itching.  Neurological: Negative for dizziness, loss of consciousness and headaches.  Endo/Heme/Allergies: Negative for environmental allergies.  Psychiatric/Behavioral: Negative for depression. The patient is not nervous/anxious.        Objective:    Physical Exam Vitals signs and nursing note reviewed.  Constitutional:      General: She is not in acute  distress.    Appearance: She is well-developed.  HENT:     Head: Normocephalic and atraumatic.     Nose: Nose normal.  Eyes:     General:        Right eye: No discharge.        Left eye: No discharge.  Neck:     Musculoskeletal: Normal range of motion and neck supple.  Cardiovascular:     Rate and Rhythm: Normal rate and regular rhythm.     Heart sounds: No murmur.  Pulmonary:     Effort: Pulmonary effort is normal.     Breath sounds: Normal breath sounds.  Abdominal:     General: Bowel sounds are normal.     Palpations: Abdomen is soft.     Tenderness: There is no abdominal tenderness.  Skin:    General: Skin is warm and dry.  Neurological:     Mental Status: She is alert and oriented to person, place, and time.     BP 134/74   Pulse 72   Temp 98.2 F (36.8 C) (Oral)   Resp 18   Ht 5\' 7"  (1.702 m)   Wt 205 lb 6.4 oz (93.2 kg)   SpO2 99%   BMI 32.17 kg/m  Wt Readings from Last 3 Encounters:  09/14/18 205 lb 6.4 oz (93.2 kg)  08/29/18 204 lb (92.5 kg)  07/06/18 200 lb (90.7 kg)     Lab Results  Component Value Date   WBC 6.5 06/26/2018   HGB 14.7 06/26/2018   HCT 43.5 06/26/2018   PLT 246.0 06/26/2018   GLUCOSE 106 (H) 06/26/2018   CHOL 233 (H) 06/26/2018   TRIG 111.0 06/26/2018   HDL 72.80 06/26/2018   LDLDIRECT 148.6 09/26/2008   LDLCALC 138 (H) 06/26/2018   ALT 22 06/26/2018   AST 20 06/26/2018  NA 138 06/26/2018   K 4.4 06/26/2018   CL 103 06/26/2018   CREATININE 0.72 06/26/2018   BUN 14 06/26/2018   CO2 26 06/26/2018   TSH 1.81 06/26/2018   HGBA1C 5.8 06/26/2018    Lab Results  Component Value Date   TSH 1.81 06/26/2018   Lab Results  Component Value Date   WBC 6.5 06/26/2018   HGB 14.7 06/26/2018   HCT 43.5 06/26/2018   MCV 102.5 (H) 06/26/2018   PLT 246.0 06/26/2018   Lab Results  Component Value Date   NA 138 06/26/2018   K 4.4 06/26/2018   CO2 26 06/26/2018   GLUCOSE 106 (H) 06/26/2018   BUN 14 06/26/2018   CREATININE  0.72 06/26/2018   BILITOT 0.7 06/26/2018   ALKPHOS 86 06/26/2018   AST 20 06/26/2018   ALT 22 06/26/2018   PROT 6.6 06/26/2018   ALBUMIN 4.1 06/26/2018   CALCIUM 9.0 06/26/2018   ANIONGAP 6 02/10/2017   GFR 84.53 06/26/2018   Lab Results  Component Value Date   CHOL 233 (H) 06/26/2018   Lab Results  Component Value Date   HDL 72.80 06/26/2018   Lab Results  Component Value Date   LDLCALC 138 (H) 06/26/2018   Lab Results  Component Value Date   TRIG 111.0 06/26/2018   Lab Results  Component Value Date   CHOLHDL 3 06/26/2018   Lab Results  Component Value Date   HGBA1C 5.8 06/26/2018       Assessment & Plan:   Problem List Items Addressed This Visit    Hyperlipidemia - Primary   Essential hypertension    Well controlled, no changes to meds. Encouraged heart healthy diet such as the DASH diet and exercise as tolerated.       Dermatitis    Has been following with dermatology but has not had any resolution. Is spreading from legs to arms now. Will try a medrol dose pack and she will continue her topical Betamethasone and Tacrolimus for now.       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 Referred to ENT for evaluation.       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 Lombardozzi's ranitidine. I am also having her start on cetirizine, methylPREDNISolone, and fluconazole. Additionally, I am having her maintain her b complex vitamins, Cholecalciferol (VITAMIN D PO), multivitamin with minerals, Coenzyme Q10, atorvastatin, and metoprolol succinate.  Meds ordered this encounter  Medications  .  DISCONTD: famotidine (PEPCID) 20 MG tablet    Sig: Take 1 tablet (20 mg total) by mouth 2 (two) times daily.    Dispense:  60 tablet    Refill:  3  . cetirizine (ZYRTEC) 10 MG tablet    Sig: Take 1 tablet (10 mg total) by mouth 2 (two) times daily.    Dispense:  30 tablet    Refill:  5  . methylPREDNISolone (MEDROL) 4 MG tablet    Sig: 5 tab po qd X 1d then 4 tab po qd X 1d then 3 tab  po qd X 1d then 2 tab po qd then 1 tab po qd    Dispense:  15 tablet    Refill:  0  . fluconazole (DIFLUCAN) 150 MG tablet    Sig: 1 tab po daily x 3 days then 1 tab po weekly x 3 weeks.    Dispense:  6 tablet    Refill:  1     Penni Homans, MD

## 2018-09-26 ENCOUNTER — Ambulatory Visit (HOSPITAL_BASED_OUTPATIENT_CLINIC_OR_DEPARTMENT_OTHER)
Admission: RE | Admit: 2018-09-26 | Discharge: 2018-09-26 | Disposition: A | Discharge status: Home / Self Care | Payer: Medicare Other | Source: Ambulatory Visit | Attending: Family Medicine | Admitting: Family Medicine

## 2018-09-26 ENCOUNTER — Ambulatory Visit (HOSPITAL_BASED_OUTPATIENT_CLINIC_OR_DEPARTMENT_OTHER): Payer: Self-pay

## 2018-09-26 DIAGNOSIS — M85832 Other specified disorders of bone density and structure, left forearm: Secondary | ICD-10-CM | POA: Diagnosis not present

## 2018-09-26 DIAGNOSIS — Z780 Asymptomatic menopausal state: Secondary | ICD-10-CM | POA: Diagnosis not present

## 2018-09-27 DIAGNOSIS — D485 Neoplasm of uncertain behavior of skin: Secondary | ICD-10-CM | POA: Diagnosis not present

## 2018-09-27 DIAGNOSIS — L853 Xerosis cutis: Secondary | ICD-10-CM | POA: Diagnosis not present

## 2018-09-27 DIAGNOSIS — H61011 Acute perichondritis of right external ear: Secondary | ICD-10-CM | POA: Diagnosis not present

## 2018-10-06 ENCOUNTER — Encounter: Payer: Self-pay | Admitting: Family Medicine

## 2018-10-18 ENCOUNTER — Other Ambulatory Visit: Payer: Self-pay | Admitting: Family Medicine

## 2018-11-05 ENCOUNTER — Encounter: Payer: Self-pay | Admitting: Family Medicine

## 2018-11-11 ENCOUNTER — Other Ambulatory Visit: Payer: Self-pay | Admitting: Family Medicine

## 2018-11-28 ENCOUNTER — Ambulatory Visit (HOSPITAL_BASED_OUTPATIENT_CLINIC_OR_DEPARTMENT_OTHER): Payer: Self-pay

## 2018-12-21 ENCOUNTER — Encounter: Payer: Self-pay | Admitting: Family Medicine

## 2018-12-27 ENCOUNTER — Other Ambulatory Visit: Payer: Medicare Other

## 2018-12-29 ENCOUNTER — Encounter: Payer: Self-pay | Admitting: Family Medicine

## 2019-03-05 ENCOUNTER — Other Ambulatory Visit: Payer: Self-pay

## 2019-03-05 ENCOUNTER — Ambulatory Visit (HOSPITAL_BASED_OUTPATIENT_CLINIC_OR_DEPARTMENT_OTHER)
Admission: RE | Admit: 2019-03-05 | Discharge: 2019-03-05 | Disposition: A | Discharge status: Home / Self Care | Payer: Medicare Other | Source: Ambulatory Visit | Attending: Family Medicine | Admitting: Family Medicine

## 2019-03-05 DIAGNOSIS — Z1239 Encounter for other screening for malignant neoplasm of breast: Secondary | ICD-10-CM | POA: Diagnosis present

## 2019-03-05 DIAGNOSIS — Z1231 Encounter for screening mammogram for malignant neoplasm of breast: Secondary | ICD-10-CM | POA: Diagnosis not present

## 2019-04-18 ENCOUNTER — Other Ambulatory Visit: Payer: Self-pay

## 2019-04-23 ENCOUNTER — Encounter: Payer: Self-pay | Admitting: Family Medicine

## 2019-04-23 ENCOUNTER — Other Ambulatory Visit: Payer: Self-pay

## 2019-04-23 ENCOUNTER — Ambulatory Visit (INDEPENDENT_AMBULATORY_CARE_PROVIDER_SITE_OTHER): Payer: Medicare Other | Admitting: Family Medicine

## 2019-04-23 VITALS — BP 162/94 | HR 84 | Temp 97.6°F | Resp 18 | Wt 206.4 lb

## 2019-04-23 DIAGNOSIS — R739 Hyperglycemia, unspecified: Secondary | ICD-10-CM | POA: Diagnosis not present

## 2019-04-23 DIAGNOSIS — Z0000 Encounter for general adult medical examination without abnormal findings: Secondary | ICD-10-CM

## 2019-04-23 DIAGNOSIS — Z8739 Personal history of other diseases of the musculoskeletal system and connective tissue: Secondary | ICD-10-CM | POA: Diagnosis not present

## 2019-04-23 DIAGNOSIS — R1011 Right upper quadrant pain: Secondary | ICD-10-CM

## 2019-04-23 DIAGNOSIS — E785 Hyperlipidemia, unspecified: Secondary | ICD-10-CM | POA: Diagnosis not present

## 2019-04-23 DIAGNOSIS — Z0001 Encounter for general adult medical examination with abnormal findings: Secondary | ICD-10-CM

## 2019-04-23 DIAGNOSIS — I10 Essential (primary) hypertension: Secondary | ICD-10-CM | POA: Diagnosis not present

## 2019-04-23 DIAGNOSIS — R1013 Epigastric pain: Secondary | ICD-10-CM

## 2019-04-23 LAB — COMPREHENSIVE METABOLIC PANEL
ALT: 23 U/L (ref 0–35)
AST: 19 U/L (ref 0–37)
Albumin: 4.2 g/dL (ref 3.5–5.2)
Alkaline Phosphatase: 91 U/L (ref 39–117)
BUN: 13 mg/dL (ref 6–23)
CO2: 26 mEq/L (ref 19–32)
Calcium: 9.1 mg/dL (ref 8.4–10.5)
Chloride: 103 mEq/L (ref 96–112)
Creatinine, Ser: 0.66 mg/dL (ref 0.40–1.20)
GFR: 87.73 mL/min (ref >60.00)
Glucose, Bld: 87 mg/dL (ref 70–99)
Potassium: 4.2 mEq/L (ref 3.5–5.1)
Sodium: 139 mEq/L (ref 135–145)
Total Bilirubin: 0.9 mg/dL (ref 0.2–1.2)
Total Protein: 6.8 g/dL (ref 6.0–8.3)

## 2019-04-23 LAB — CBC
HCT: 44.3 % (ref 36.0–46.0)
Hemoglobin: 14.9 g/dL (ref 12.0–15.0)
MCHC: 33.6 g/dL (ref 30.0–36.0)
MCV: 101.5 fl — ABNORMAL HIGH (ref 78.0–100.0)
Platelets: 231 10*3/uL (ref 150.0–400.0)
RBC: 4.36 Mil/uL (ref 3.87–5.11)
RDW: 14.8 % (ref 11.5–15.5)
WBC: 7 10*3/uL (ref 4.0–10.5)

## 2019-04-23 LAB — TSH: TSH: 1.52 u[IU]/mL (ref 0.35–4.50)

## 2019-04-23 LAB — LIPID PANEL
Cholesterol: 231 mg/dL — ABNORMAL HIGH (ref 0–200)
HDL: 78.3 mg/dL (ref >39.00)
LDL Cholesterol: 131 mg/dL — ABNORMAL HIGH (ref 0–99)
NonHDL: 153.18
Total CHOL/HDL Ratio: 3
Triglycerides: 109 mg/dL (ref 0.0–149.0)
VLDL: 21.8 mg/dL (ref 0.0–40.0)

## 2019-04-23 LAB — HEMOGLOBIN A1C: Hgb A1c MFr Bld: 6 % (ref 4.6–6.5)

## 2019-04-23 MED ORDER — METOPROLOL SUCCINATE ER 25 MG PO TB24: 25.0000 mg | ORAL_TABLET | Freq: Every day | ORAL | 1 refills | Status: DC

## 2019-04-23 NOTE — Patient Instructions (Addendum)
Pulse oximeter  Check vitals weekly  Preventive Care 65 Years and Older, Female Preventive care refers to lifestyle choices and visits with your health care provider that can promote health and wellness. This includes:  A yearly physical exam. This is also called an annual well check.  Regular dental and eye exams.  Immunizations.  Screening for certain conditions.  Healthy lifestyle choices, such as diet and exercise. What can I expect for my preventive care visit? Physical exam Your health care provider will check:  Height and weight. These may be used to calculate body mass index (BMI), which is a measurement that tells if you are at a healthy weight.  Heart rate and blood pressure.  Your skin for abnormal spots. Counseling Your health care provider may ask you questions about:  Alcohol, tobacco, and drug use.  Emotional well-being.  Home and relationship well-being.  Sexual activity.  Eating habits.  History of falls.  Memory and ability to understand (cognition).  Work and work Statistician.  Pregnancy and menstrual history. What immunizations do I need?  Influenza (flu) vaccine  This is recommended every year. Tetanus, diphtheria, and pertussis (Tdap) vaccine  You may need a Td booster every 10 years. Varicella (chickenpox) vaccine  You may need this vaccine if you have not already been vaccinated. Zoster (shingles) vaccine  You may need this after age 30. Pneumococcal conjugate (PCV13) vaccine  One dose is recommended after age 9. Pneumococcal polysaccharide (PPSV23) vaccine  One dose is recommended after age 24. Measles, mumps, and rubella (MMR) vaccine  You may need at least one dose of MMR if you were born in 1957 or later. You may also need a second dose. Meningococcal conjugate (MenACWY) vaccine  You may need this if you have certain conditions. Hepatitis A vaccine  You may need this if you have certain conditions or if you travel  or work in places where you may be exposed to hepatitis A. Hepatitis B vaccine  You may need this if you have certain conditions or if you travel or work in places where you may be exposed to hepatitis B. Haemophilus influenzae type b (Hib) vaccine  You may need this if you have certain conditions. You may receive vaccines as individual doses or as more than one vaccine together in one shot (combination vaccines). Talk with your health care provider about the risks and benefits of combination vaccines. What tests do I need? Blood tests  Lipid and cholesterol levels. These may be checked every 5 years, or more frequently depending on your overall health.  Hepatitis C test.  Hepatitis B test. Screening  Lung cancer screening. You may have this screening every year starting at age 74 if you have a 30-pack-year history of smoking and currently smoke or have quit within the past 15 years.  Colorectal cancer screening. All adults should have this screening starting at age 80 and continuing until age 28. Your health care provider may recommend screening at age 56 if you are at increased risk. You will have tests every 1-10 years, depending on your results and the type of screening test.  Diabetes screening. This is done by checking your blood sugar (glucose) after you have not eaten for a while (fasting). You may have this done every 1-3 years.  Mammogram. This may be done every 1-2 years. Talk with your health care provider about how often you should have regular mammograms.  BRCA-related cancer screening. This may be done if you have a family history  of breast, ovarian, tubal, or peritoneal cancers. Other tests  Sexually transmitted disease (STD) testing.  Bone density scan. This is done to screen for osteoporosis. You may have this done starting at age 37. Follow these instructions at home: Eating and drinking  Eat a diet that includes fresh fruits and vegetables, whole grains, lean  protein, and low-fat dairy products. Limit your intake of foods with high amounts of sugar, saturated fats, and salt.  Take vitamin and mineral supplements as recommended by your health care provider.  Do not drink alcohol if your health care provider tells you not to drink.  If you drink alcohol: ? Limit how much you have to 0-1 drink a day. ? Be aware of how much alcohol is in your drink. In the [place] one drink equals one 12 oz bottle of beer (355 mL), one 5 oz glass of wine (148 mL), or one 1 oz glass of hard liquor (44 mL). Lifestyle  Take daily care of your teeth and gums.  Stay active. Exercise for at least 30 minutes on 5 or more days each week.  Do not use any products that contain nicotine or tobacco, such as cigarettes, e-cigarettes, and chewing tobacco. If you need help quitting, ask your health care provider.  If you are sexually active, practice safe sex. Use a condom or other form of protection in order to prevent STIs (sexually transmitted infections).  Talk with your health care provider about taking a low-dose aspirin or statin. What's next?  Go to your health care provider once a year for a well check visit.  Ask your health care provider how often you should have your eyes and teeth checked.  Stay up to date on all vaccines. This information is not intended to replace advice given to you by your health care provider. Make sure you discuss any questions you have with your health care provider. Document Released: 09/05/2015 Document Revised: 08/03/2018 Document Reviewed: 08/03/2018 Elsevier Patient Education  2020 [place] 65 Years and Older, Female Preventive care refers to lifestyle choices and visits with your health care provider that can promote health and wellness. This includes:  A yearly physical exam. This is also called an annual well check.  Regular dental and eye exams.  Immunizations.  Screening for certain conditions.   Healthy lifestyle choices, such as diet and exercise. What can I expect for my preventive care visit? Physical exam Your health care provider will check:  Height and weight. These may be used to calculate body mass index (BMI), which is a measurement that tells if you are at a healthy weight.  Heart rate and blood pressure.  Your skin for abnormal spots. Counseling Your health care provider may ask you questions about:  Alcohol, tobacco, and drug use.  Emotional well-being.  Home and relationship well-being.  Sexual activity.  Eating habits.  History of falls.  Memory and ability to understand (cognition).  Work and work Statistician.  Pregnancy and menstrual history. What immunizations do I need?  Influenza (flu) vaccine  This is recommended every year. Tetanus, diphtheria, and pertussis (Tdap) vaccine  You may need a Td booster every 10 years. Varicella (chickenpox) vaccine  You may need this vaccine if you have not already been vaccinated. Zoster (shingles) vaccine  You may need this after age 72. Pneumococcal conjugate (PCV13) vaccine  One dose is recommended after age 86. Pneumococcal polysaccharide (PPSV23) vaccine  One dose is recommended after age 28. Measles, mumps, and rubella (  MMR) vaccine  You may need at least one dose of MMR if you were born in 1957 or later. You may also need a second dose. Meningococcal conjugate (MenACWY) vaccine  You may need this if you have certain conditions. Hepatitis A vaccine  You may need this if you have certain conditions or if you travel or work in places where you may be exposed to hepatitis A. Hepatitis B vaccine  You may need this if you have certain conditions or if you travel or work in places where you may be exposed to hepatitis B. Haemophilus influenzae type b (Hib) vaccine  You may need this if you have certain conditions. You may receive vaccines as individual doses or as more than one vaccine  together in one shot (combination vaccines). Talk with your health care provider about the risks and benefits of combination vaccines. What tests do I need? Blood tests  Lipid and cholesterol levels. These may be checked every 5 years, or more frequently depending on your overall health.  Hepatitis C test.  Hepatitis B test. Screening  Lung cancer screening. You may have this screening every year starting at age 73 if you have a 30-pack-year history of smoking and currently smoke or have quit within the past 15 years.  Colorectal cancer screening. All adults should have this screening starting at age 48 and continuing until age 11. Your health care provider may recommend screening at age 38 if you are at increased risk. You will have tests every 1-10 years, depending on your results and the type of screening test.  Diabetes screening. This is done by checking your blood sugar (glucose) after you have not eaten for a while (fasting). You may have this done every 1-3 years.  Mammogram. This may be done every 1-2 years. Talk with your health care provider about how often you should have regular mammograms.  BRCA-related cancer screening. This may be done if you have a family history of breast, ovarian, tubal, or peritoneal cancers. Other tests  Sexually transmitted disease (STD) testing.  Bone density scan. This is done to screen for osteoporosis. You may have this done starting at age 51. Follow these instructions at home: Eating and drinking  Eat a diet that includes fresh fruits and vegetables, whole grains, lean protein, and low-fat dairy products. Limit your intake of foods with high amounts of sugar, saturated fats, and salt.  Take vitamin and mineral supplements as recommended by your health care provider.  Do not drink alcohol if your health care provider tells you not to drink.  If you drink alcohol: ? Limit how much you have to 0-1 drink a day. ? Be aware of how much  alcohol is in your drink. In the [place] one drink equals one 12 oz bottle of beer (355 mL), one 5 oz glass of wine (148 mL), or one 1 oz glass of hard liquor (44 mL). Lifestyle  Take daily care of your teeth and gums.  Stay active. Exercise for at least 30 minutes on 5 or more days each week.  Do not use any products that contain nicotine or tobacco, such as cigarettes, e-cigarettes, and chewing tobacco. If you need help quitting, ask your health care provider.  If you are sexually active, practice safe sex. Use a condom or other form of protection in order to prevent STIs (sexually transmitted infections).  Talk with your health care provider about taking a low-dose aspirin or statin. What's next?  Go to your health  care provider once a year for a well check visit.  Ask your health care provider how often you should have your eyes and teeth checked.  Stay up to date on all vaccines. This information is not intended to replace advice given to you by your health care provider. Make sure you discuss any questions you have with your health care provider. Document Released: 09/05/2015 Document Revised: 08/03/2018 Document Reviewed: 08/03/2018 Elsevier Patient Education  2020 [employer].

## 2019-04-23 NOTE — Assessment & Plan Note (Signed)
Encouraged to get adequate exercise, calcium and vitamin d intake 

## 2019-04-23 NOTE — Assessment & Plan Note (Signed)
Encouraged heart healthy diet, increase exercise, avoid trans fats, consider a krill oil cap daily 

## 2019-04-23 NOTE — Assessment & Plan Note (Addendum)
Intermittent right upper quadrant pain tender to touch the past week and has a history of pain in past will proceed with ultrasound and referral to gastroenterology.

## 2019-04-23 NOTE — Assessment & Plan Note (Addendum)
Check vitals weekly. Increase the Metoprolol XR 25 mg dailyEncouraged heart healthy diet such as the DASH diet and exercise as tolerated.

## 2019-04-23 NOTE — Assessment & Plan Note (Signed)
hgba1c acceptable, minimize simple carbs. Increase exercise as tolerated.  

## 2019-04-23 NOTE — Progress Notes (Signed)
Patient ID: Renee [name], female   DOB: 03/26/1946, 73 y.o.   MRN: [id number]   Subjective:    Patient ID: Renee [name], female    DOB: [birth year], 73 y.o.   MRN: [id number]  No chief complaint on file.   HPI Patient is in today for annual preventative exam and follow up on chronic medical concerns including hyperlipidemia, hypertension, reflux, osteopenia and more. No recent febrile illness or hospitalizations. No polyuria or polydipsia. Is trying to maintain a heart healthy diet. She tries to stay active. No acute concerns. Denies CP/palp/SOB/HA/congestion/fevers/GI or GU c/o. Taking meds as prescribed. She is maintaining quarantine with her family. Is trying to stay active and maintain a heart healthy diet.   Past Medical History:  Diagnosis Date  . Abdominal pain 07/05/2017  . ABNORMAL INVOLUNTARY MOVEMENTS 01/01/2009   Qualifier: Diagnosis of  By: Linna Darner MD, Gwyndolyn Saxon    . Allergic rhinitis   . Allergic rhinitis 09/19/2008   Qualifier: Diagnosis of  By: Wynona Luna   . Allergy   . Cervical cancer screening 05/08/2013   Menarche 11, regular Menopause 40s G2P2 s/p 2 svd No abnormal paps or MGM Fatty cyst on labia removed.   . Cervical pain (neck) 10/07/2015  . CHEST PAIN 09/19/2008   Qualifier: Diagnosis of  By: Wynona Luna   . 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 Concussion 1969  . Diverticulosis 05/06/2017  . Essential hypertension 09/19/2008   Qualifier: Diagnosis of  By: Wynona Luna   . GERD (gastroesophageal reflux disease)   . H/O osteopenia 02/08/2016  . H/O tobacco use, presenting hazards to health 09/19/2008   Qualifier: Diagnosis of  By: Wynona Luna Smoking roughly 5 cig daily  Last cigarette was in December of 2016  . Head tremor 12/27/2016  . 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 Hx of adenomatous colonic polyps 01/27/2018  . Hyperglycemia 10/07/2016   . Hyperlipidemia   . Hyperlipidemia, mixed 10/03/2008   Qualifier: Diagnosis of  By: Wynona Luna   . Hypertension   . 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 Obesity 07/06/2016  . Occult blood positive stool   . Polyp of sigmoid colon 12/08/2016   11/2016 - Unable to remove or get bx at colonoscopy. Fixed sigmoid colon prevents. Referred for surgery.  Marland Kitchen PONV (postoperative nausea and vomiting)    ,   . Preventative health care 05/12/2013  . 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 Tobacco abuse   . Tremor, essential   . TREMOR, ESSENTIAL 09/19/2008   Qualifier: Diagnosis of  By: Wynona Luna   . Vertigo   . Vertigo     Past Surgical History:  Procedure Laterality Date  . INNER EAR SURGERY  2015   with tube  . MOUTH SURGERY  01/22/11   lower denture secured by titanium bolts  . rectal suction biospy    . XI ROBOTIC ASSISTED LOWER ANTERIOR RESECTION N/A 02/09/2017   Procedure: XI ROBOTIC RESECTION OF SIGMOID COLON, RIGID PROCTOSCOPY;  Surgeon: Michael Boston, MD;  Location: [facility];  Service: General;  Laterality: N/A;    Family History  Problem Relation Age of Onset  . Heart attack Father 25       deceased  .  Stroke Mother   . Hypertension Mother   . Alcohol abuse Mother   . Obesity Daughter   . Heart disease Paternal Grandmother        AAA rupture  . Heart disease Paternal Grandfather        MI  . Colon cancer Neg Hx   . Breast cancer Neg Hx   . Prostate cancer Neg Hx   . Diabetes Neg Hx   . Liver cancer Neg Hx   . Rectal cancer Neg Hx   . Stomach cancer Neg Hx     Social History   Socioeconomic History  . Marital status: Married    Spouse name: Not on file  . Number of children: Not on file  . Years of education: Not on file  . Highest education level: Not on file  Occupational History  . Not on file  Social Needs  . Financial resource strain: Not on file  . Food insecurity    Worry: Not on file     Inability: Not on file  . Transportation needs    Medical: Not on file    Non-medical: Not on file  Tobacco Use  . Smoking status: Former Smoker    Packs/day: 0.50    Types: Cigarettes    Quit date: 09/07/2015    Years since quitting: 3.6  . Smokeless tobacco: Never Used  Substance and Sexual Activity  . Alcohol use: Yes    Alcohol/week: 14.0 standard drinks    Types: 14 Glasses of wine per week    Comment: 3 glasses of wine daily   . Drug use: No  . Sexual activity: Yes  Lifestyle  . Physical activity    Days per week: Not on file    Minutes per session: Not on file  . Stress: Not on file  Relationships  . Social Herbalist on phone: Not on file    Gets together: Not on file    Attends religious service: Not on file    Active member of club or organization: Not on file    Attends meetings of clubs or organizations: Not on file    Relationship status: Not on file  . Intimate partner violence    Fear of current or ex partner: Not on file    Emotionally abused: Not on file    Physically abused: Not on file    Forced sexual activity: Not on file  Other Topics Concern  . Not on file  Social History Narrative  . Not on file    Outpatient Medications Prior to Visit  Medication Sig Dispense Refill  . atorvastatin (LIPITOR) 10 MG tablet TAKE 1/2 TABLET (5 MG TOTAL) BY MOUTH EVERY OTHER DAY. 15 tablet 5  . b complex vitamins tablet Take 1 tablet by mouth every other day.    . Coenzyme Q10 200 MG capsule Take 200 mg by mouth every other day. 3 times per week    . famotidine (PEPCID) 20 MG tablet Take 1 tablet (20 mg total) by mouth daily as needed for heartburn or indigestion. 90 tablet 1  . Multiple Vitamin (MULTIVITAMIN WITH MINERALS) TABS tablet Take 1 tablet by mouth every other day.    . cetirizine (ZYRTEC) 10 MG tablet TAKE 1 TABLET BY MOUTH TWICE A DAY 30 tablet 5  . Cholecalciferol (VITAMIN D PO) Take 1 tablet by mouth every other day. 3 times per week     . fluconazole (DIFLUCAN) 150 MG tablet 1 tab  po daily x 3 days then 1 tab po weekly x 3 weeks. 6 tablet 1  . methylPREDNISolone (MEDROL) 4 MG tablet 5 tab po qd X 1d then 4 tab po qd X 1d then 3 tab po qd X 1d then 2 tab po qd then 1 tab po qd 15 tablet 0  . metoprolol succinate (TOPROL-XL) 25 MG 24 hr tablet Take 0.5 tablets (12.5 mg total) by mouth daily. 45 tablet 1   No facility-administered medications prior to visit.     Allergies  Allergen Reactions  . Amoxicillin     REACTION: TROUBLE BREATHING Has patient had a PCN reaction causing immediate rash, facial/tongue/throat swelling, SOB or lightheadedness with hypotension:Yes Has patient had a PCN reaction causing severe rash involving mucus membranes or skin necrosis:No Has patient had a PCN reaction that required hospitalization:No Has patient had a PCN reaction occurring within the last 10 years:No If all of the above answers are "NO", then may proceed with Cephalosporin use.   Abe People Hfa [Albuterol] Other (See Comments)    Hypersensitivity, anxious, disoriented  . Penicillins Rash    Has patient had a PCN reaction causing immediate rash, facial/tongue/throat swelling, SOB or lightheadedness with hypotension:unknown Has patient had a PCN reaction causing severe rash involving mucus membranes or skin necrosis:unknown Has patient had a PCN reaction that required hospitalization:no Has patient had a PCN reaction occurring within the last 10 years:no If all of the above answers are "NO", then may proceed with Cephalosporin use.     Review of Systems  Constitutional: Negative for chills, fever and malaise/fatigue.  HENT: Negative for congestion and hearing loss.   Eyes: Negative for discharge.  Respiratory: Negative for cough, sputum production and shortness of breath.   Cardiovascular: Negative for chest pain, palpitations and leg swelling.  Gastrointestinal: Negative for abdominal pain, blood in stool, constipation, diarrhea,  heartburn, nausea and vomiting.  Genitourinary: Negative for dysuria, frequency, hematuria and urgency.  Musculoskeletal: Negative for back pain, falls and myalgias.  Skin: Negative for rash.  Neurological: Negative for dizziness, sensory change, loss of consciousness, weakness and headaches.  Endo/Heme/Allergies: Negative for environmental allergies. Does not bruise/bleed easily.  Psychiatric/Behavioral: Negative for depression and suicidal ideas. The patient is not nervous/anxious and does not have insomnia.        Objective:    Physical Exam Constitutional:      General: She is not in acute distress.    Appearance: She is well-developed.  HENT:     Head: Normocephalic and atraumatic.  Eyes:     Conjunctiva/sclera: Conjunctivae normal.  Neck:     Musculoskeletal: Neck supple.     Thyroid: No thyromegaly.  Cardiovascular:     Rate and Rhythm: Normal rate and regular rhythm.     Heart sounds: Normal heart sounds. No murmur.  Pulmonary:     Effort: Pulmonary effort is normal. No respiratory distress.     Breath sounds: Normal breath sounds.  Abdominal:     General: Bowel sounds are normal. There is no distension.     Palpations: Abdomen is soft. There is no mass.     Tenderness: There is no abdominal tenderness.  Lymphadenopathy:     Cervical: No cervical adenopathy.  Skin:    General: Skin is warm and dry.  Neurological:     Mental Status: She is alert and oriented to person, place, and time.  Psychiatric:        Behavior: Behavior normal.     BP (!) 162/94 (  BP Location: Left Arm, Patient Position: Sitting, Cuff Size: Normal)   Pulse 84   Temp 97.6 F (36.4 C) (Oral)   Resp 18   Wt 206 lb 6.4 oz (93.6 kg)   SpO2 96%   BMI 32.33 kg/m  Wt Readings from Last 3 Encounters:  04/23/19 206 lb 6.4 oz (93.6 kg)  09/14/18 205 lb 6.4 oz (93.2 kg)  08/29/18 204 lb (92.5 kg)    Diabetic Foot Exam - Simple   No data filed     Lab Results  Component Value Date   WBC  7.0 04/23/2019   HGB 14.9 04/23/2019   HCT 44.3 04/23/2019   PLT 231.0 04/23/2019   GLUCOSE 87 04/23/2019   CHOL 231 (H) 04/23/2019   TRIG 109.0 04/23/2019   HDL 78.30 04/23/2019   LDLDIRECT 148.6 09/26/2008   LDLCALC 131 (H) 04/23/2019   ALT 23 04/23/2019   AST 19 04/23/2019   NA 139 04/23/2019   K 4.2 04/23/2019   CL 103 04/23/2019   CREATININE 0.66 04/23/2019   BUN 13 04/23/2019   CO2 26 04/23/2019   TSH 1.52 04/23/2019   HGBA1C 6.0 04/23/2019    Lab Results  Component Value Date   TSH 1.52 04/23/2019   Lab Results  Component Value Date   WBC 7.0 04/23/2019   HGB 14.9 04/23/2019   HCT 44.3 04/23/2019   MCV 101.5 (H) 04/23/2019   PLT 231.0 04/23/2019   Lab Results  Component Value Date   NA 139 04/23/2019   K 4.2 04/23/2019   CO2 26 04/23/2019   GLUCOSE 87 04/23/2019   BUN 13 04/23/2019   CREATININE 0.66 04/23/2019   BILITOT 0.9 04/23/2019   ALKPHOS 91 04/23/2019   AST 19 04/23/2019   ALT 23 04/23/2019   PROT 6.8 04/23/2019   ALBUMIN 4.2 04/23/2019   CALCIUM 9.1 04/23/2019   ANIONGAP 6 02/10/2017   GFR 87.73 04/23/2019   Lab Results  Component Value Date   CHOL 231 (H) 04/23/2019   Lab Results  Component Value Date   HDL 78.30 04/23/2019   Lab Results  Component Value Date   LDLCALC 131 (H) 04/23/2019   Lab Results  Component Value Date   TRIG 109.0 04/23/2019   Lab Results  Component Value Date   CHOLHDL 3 04/23/2019   Lab Results  Component Value Date   HGBA1C 6.0 04/23/2019       Assessment & Plan:   Problem List Items Addressed This Visit    Hyperlipidemia    Encouraged heart healthy diet, increase exercise, avoid trans fats, consider a krill oil cap daily      Relevant Medications   metoprolol succinate (TOPROL-XL) 25 MG 24 hr tablet   Other Relevant Orders   Lipid panel (Completed)   Essential hypertension    Check vitals weekly. Increase the Metoprolol XR 25 mg dailyEncouraged heart healthy diet such as the DASH diet  and exercise as tolerated.       Relevant Medications   metoprolol succinate (TOPROL-XL) 25 MG 24 hr tablet   Other Relevant Orders   CBC (Completed)   Comprehensive metabolic panel (Completed)   TSH (Completed)   Preventative health care - Primary    Patient encouraged to maintain heart healthy diet, regular exercise, adequate sleep. Consider daily probiotics. Take medications as prescribed. Labs ordered and reviewed      Relevant Orders   Hemoglobin A1c (Completed)   CBC (Completed)   Comprehensive metabolic panel (Completed)   Lipid  panel (Completed)   TSH (Completed)   H/O osteopenia    Encouraged to get adequate exercise, calcium and vitamin d intake      Hyperglycemia    hgba1c acceptable, minimize simple carbs. Increase exercise as tolerated.      Relevant Orders   Hemoglobin A1c (Completed)   Abdominal pain    Intermittent right upper quadrant pain tender to touch the past week and has a history of pain in past will proceed with ultrasound and referral to gastroenterology.       Relevant Orders   US Abdomen Complete   Ambulatory referral to Gastroenterology    Other Visit Diagnoses    RUQ pain       Relevant Orders   US Abdomen Complete   Ambulatory referral to Gastroenterology      I have discontinued Akiah L. Lavergne's Cholecalciferol (VITAMIN D PO), methylPREDNISolone, fluconazole, and cetirizine. I have also changed her metoprolol succinate. Additionally, I am having her maintain her b complex vitamins, multivitamin with minerals, Coenzyme Q10, famotidine, and atorvastatin.  Meds ordered this encounter  Medications  . metoprolol succinate (TOPROL-XL) 25 MG 24 hr tablet    Sig: Take 1 tablet (25 mg total) by mouth daily.    Dispense:  90 tablet    Refill:  1     Penni Homans, MD

## 2019-04-23 NOTE — Assessment & Plan Note (Signed)
Patient encouraged to maintain heart healthy diet, regular exercise, adequate sleep. Consider daily probiotics. Take medications as prescribed. Labs ordered and reviewed 

## 2019-05-02 ENCOUNTER — Other Ambulatory Visit: Payer: Self-pay

## 2019-05-02 ENCOUNTER — Ambulatory Visit (HOSPITAL_BASED_OUTPATIENT_CLINIC_OR_DEPARTMENT_OTHER)
Admission: RE | Admit: 2019-05-02 | Discharge: 2019-05-02 | Disposition: A | Discharge status: Home / Self Care | Payer: Medicare Other | Source: Ambulatory Visit | Attending: Family Medicine | Admitting: Family Medicine

## 2019-05-02 DIAGNOSIS — R1013 Epigastric pain: Secondary | ICD-10-CM | POA: Diagnosis not present

## 2019-05-02 DIAGNOSIS — R1011 Right upper quadrant pain: Secondary | ICD-10-CM | POA: Diagnosis not present

## 2019-05-07 ENCOUNTER — Ambulatory Visit: Payer: Medicare Other | Admitting: Gastroenterology

## 2019-05-09 ENCOUNTER — Other Ambulatory Visit (HOSPITAL_BASED_OUTPATIENT_CLINIC_OR_DEPARTMENT_OTHER): Payer: Medicare Other

## 2019-05-21 DIAGNOSIS — H6521 Chronic serous otitis media, right ear: Secondary | ICD-10-CM

## 2019-05-21 DIAGNOSIS — H9193 Unspecified hearing loss, bilateral: Secondary | ICD-10-CM | POA: Diagnosis not present

## 2019-05-21 HISTORY — DX: Chronic serous otitis media, right ear: H65.21

## 2019-07-14 ENCOUNTER — Emergency Department (HOSPITAL_BASED_OUTPATIENT_CLINIC_OR_DEPARTMENT_OTHER)
Admission: EM | Admit: 2019-07-14 | Discharge: 2019-07-14 | Disposition: A | Discharge status: Home / Self Care | Payer: Medicare Other | Attending: Emergency Medicine | Admitting: Emergency Medicine

## 2019-07-14 ENCOUNTER — Other Ambulatory Visit: Payer: Self-pay

## 2019-07-14 DIAGNOSIS — Z888 Allergy status to other drugs, medicaments and biological substances status: Secondary | ICD-10-CM | POA: Diagnosis not present

## 2019-07-14 DIAGNOSIS — Y9389 Activity, other specified: Secondary | ICD-10-CM | POA: Diagnosis not present

## 2019-07-14 DIAGNOSIS — Z880 Allergy status to penicillin: Secondary | ICD-10-CM | POA: Insufficient documentation

## 2019-07-14 DIAGNOSIS — Z87891 Personal history of nicotine dependence: Secondary | ICD-10-CM | POA: Diagnosis not present

## 2019-07-14 DIAGNOSIS — Y999 Unspecified external cause status: Secondary | ICD-10-CM | POA: Diagnosis not present

## 2019-07-14 DIAGNOSIS — Y92009 Unspecified place in unspecified non-institutional (private) residence as the place of occurrence of the external cause: Secondary | ICD-10-CM | POA: Diagnosis not present

## 2019-07-14 DIAGNOSIS — E785 Hyperlipidemia, unspecified: Secondary | ICD-10-CM | POA: Insufficient documentation

## 2019-07-14 DIAGNOSIS — X58XXXA Exposure to other specified factors, initial encounter: Secondary | ICD-10-CM | POA: Insufficient documentation

## 2019-07-14 DIAGNOSIS — T189XXA Foreign body of alimentary tract, part unspecified, initial encounter: Secondary | ICD-10-CM

## 2019-07-14 DIAGNOSIS — Z79899 Other long term (current) drug therapy: Secondary | ICD-10-CM | POA: Diagnosis not present

## 2019-07-14 DIAGNOSIS — I10 Essential (primary) hypertension: Secondary | ICD-10-CM | POA: Insufficient documentation

## 2019-07-14 NOTE — ED Provider Notes (Signed)
[facility] EMERGENCY DEPARTMENT Provider Note   CSN: [id number] Arrival date & time: 07/14/19  1408     History   Chief Complaint Chief Complaint  Patient presents with  . Swallowed Foreign Body    HPI Renee [name] is a 73 y.o. female history of reflux, dentures here presenting with swelling part of her dentures.  Patient states that she took out her dentures and realized that the end broke up and she might have swallowed It.  She denies any trouble swallowing or trouble breathing.  She called her doctor and was told to come here for evaluation .     The history is provided by the patient.    Past Medical History:  Diagnosis Date  . Abdominal pain 07/05/2017  . ABNORMAL INVOLUNTARY MOVEMENTS 01/01/2009   Qualifier: Diagnosis of  By: Linna Darner MD, Gwyndolyn Saxon    . Allergic rhinitis   . Allergic rhinitis 09/19/2008   Qualifier: Diagnosis of  By: Wynona Luna   . Allergy   . Cervical cancer screening 05/08/2013   Menarche 11, regular Menopause 78s G2P2 s/p 2 svd No abnormal paps or MGM Fatty cyst on labia removed.   . Cervical pain (neck) 10/07/2015  . CHEST PAIN 09/19/2008   Qualifier: Diagnosis of  By: Wynona Luna   . 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 Concussion 1969  . Diverticulosis 05/06/2017  . Essential hypertension 09/19/2008   Qualifier: Diagnosis of  By: Wynona Luna   . GERD (gastroesophageal reflux disease)   . H/O osteopenia 02/08/2016  . H/O tobacco use, presenting hazards to health 09/19/2008   Qualifier: Diagnosis of  By: Wynona Luna Smoking roughly 5 cig daily  Last cigarette was in December of 2016  . Head tremor 12/27/2016  . 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 Hx of adenomatous colonic polyps 01/27/2018  . Hyperglycemia 10/07/2016  . Hyperlipidemia   . Hyperlipidemia, mixed 10/03/2008   Qualifier: Diagnosis of  By: Wynona Luna   . Hypertension   . 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 Obesity 07/06/2016  . Occult blood positive stool   . Polyp of sigmoid colon 12/08/2016   11/2016 - Unable to remove or get bx at colonoscopy. Fixed sigmoid colon prevents. Referred for surgery.  Marland Kitchen PONV (postoperative nausea and vomiting)    ,   . Preventative health care 05/12/2013  . 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 Tobacco abuse   . Tremor, essential   . TREMOR, ESSENTIAL 09/19/2008   Qualifier: Diagnosis of  By: Wynona Luna   . Vertigo   . Vertigo     Patient Active Problem List   Diagnosis Date Noted  . Dermatitis 09/17/2018  . 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 Shoulder pain 06/29/2018  . Hx of adenomatous colonic polyps 01/27/2018  . Tinnitus 12/12/2017  . Rib pain on left side 11/17/2017  . Abdominal pain 07/05/2017  . PVC's (premature ventricular contractions) 05/10/2017  . Diverticulosis 05/06/2017  . Head tremor 12/27/2016  . Hyperglycemia 10/07/2016  . Obesity 07/06/2016  . GERD (gastroesophageal reflux disease) 07/06/2016  . H/O osteopenia 02/08/2016  . Cervical pain (neck) 10/07/2015  . Preventative health care 05/12/2013  .  Cervical cancer screening 05/08/2013  . ABNORMAL INVOLUNTARY MOVEMENTS 01/01/2009  . H/O tobacco use, presenting hazards to health 09/19/2008  . Hyperlipidemia 09/19/2008  . 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 Essential hypertension 09/19/2008  . Allergic rhinitis 09/19/2008  . CHEST PAIN 09/19/2008    Past Surgical History:  Procedure Laterality Date  . INNER EAR SURGERY  2015   with tube  . MOUTH SURGERY  01/22/11   lower denture secured by titanium bolts  . rectal suction biospy    . XI ROBOTIC ASSISTED LOWER ANTERIOR RESECTION N/A 02/09/2017   Procedure: XI ROBOTIC RESECTION OF SIGMOID COLON, RIGID PROCTOSCOPY;  Surgeon: Michael Boston, MD;  Location: [facility];   Service: General;  Laterality: N/A;     OB History   No obstetric history on file.      Home Medications    Prior to Admission medications   Medication Sig Start Date End Date Taking? Authorizing Provider  metoprolol succinate (TOPROL-XL) 25 MG 24 hr tablet Take 1 tablet (25 mg total) by mouth daily. 04/23/19  Yes [name], MD  atorvastatin (LIPITOR) 10 MG tablet TAKE 1/2 TABLET (5 MG TOTAL) BY MOUTH EVERY OTHER DAY. 10/18/18   [name], MD  b complex vitamins tablet Take 1 tablet by mouth every other day.    [provider]  Coenzyme Q10 200 MG capsule Take 200 mg by mouth every other day. 3 times per week    [provider]  famotidine (PEPCID) 20 MG tablet Take 1 tablet (20 mg total) by mouth daily as needed for heartburn or indigestion. 09/15/18   [name], MD  Multiple Vitamin (MULTIVITAMIN WITH MINERALS) TABS tablet Take 1 tablet by mouth every other day.    [provider]    Family History Family History  Problem Relation Age of Onset  . Heart attack Father 76       deceased  . Stroke Mother   . Hypertension Mother   . Alcohol abuse Mother   . Obesity Daughter   . Heart disease Paternal Grandmother        AAA rupture  . Heart disease Paternal Grandfather        MI  . Colon cancer Neg Hx   . Breast cancer Neg Hx   . Prostate cancer Neg Hx   . Diabetes Neg Hx   . Liver cancer Neg Hx   . Rectal cancer Neg Hx   . Stomach cancer Neg Hx     Social History Social History   Tobacco Use  . Smoking status: Former Smoker    Packs/day: 0.50    Types: Cigarettes    Quit date: 09/07/2015    Years since quitting: 3.8  . Smokeless tobacco: Never Used  Substance Use Topics  . Alcohol use: Yes    Alcohol/week: 14.0 standard drinks    Types: 14 Glasses of wine per week    Comment: 3 glasses of wine daily   . Drug use: No     Allergies   Amoxicillin, Proair hfa [albuterol], and Penicillins   Review of Systems Review of  Systems  HENT: Negative for congestion and drooling.   All other systems reviewed and are negative.    Physical Exam Updated Vital Signs BP (!) 172/95   Pulse (!) 105   Temp 98.8 F (37.1 C) (Oral)   Resp 18   SpO2 97%   Physical Exam Vitals signs and nursing note reviewed.  Constitutional:  Appearance: Normal appearance.  HENT:     Head: Normocephalic.     Mouth/Throat:     Comments: Upper dentures in place. Lower denture with distal plastic end missing, no foreign body in OP  Eyes:     Extraocular Movements: Extraocular movements intact.     Pupils: Pupils are equal, round, and reactive to light.  Neck:     Musculoskeletal: Normal range of motion and neck supple.     Comments: No stridor  Cardiovascular:     Rate and Rhythm: Normal rate and regular rhythm.     Pulses: Normal pulses.     Heart sounds: Normal heart sounds.  Pulmonary:     Effort: Pulmonary effort is normal.     Breath sounds: Normal breath sounds.  Abdominal:     General: Abdomen is flat.     Palpations: Abdomen is soft.  Musculoskeletal: Normal range of motion.  Skin:    General: Skin is warm.     Capillary Refill: Capillary refill takes less than 2 seconds.  Neurological:     General: No focal deficit present.     Mental Status: She is alert.  Psychiatric:        Mood and Affect: Mood normal.      ED Treatments / Results  Labs (all labs ordered are listed, but only abnormal results are displayed) Labs Reviewed - No data to display  EKG None  Radiology No results found.  Procedures Procedures (including critical care time)  Medications Ordered in ED Medications - No data to display   Initial Impression / Assessment and Plan / ED Course  I have reviewed the triage vital signs and the nursing notes.  Pertinent labs & imaging results that were available during my care of the patient were reviewed by me and considered in my medical decision making (see chart for details).        LASHAYA NOVINGER is a 73 y.o. female here after she may have swallowed is very small part of her denture. She has a pea size portion that is missing and that part is plastic. Has no trouble breathing or swallowing. I told her that plastic would show up on xray. If she had swallowed it, she shows no signs of aspiration or esophageal obstruction. Told her to observe for trouble swallowing or breathing. Stable for discharge    Final Clinical Impressions(s) / ED Diagnoses   Final diagnoses:  Foreign body ingestion, initial encounter    ED Discharge Orders    None       Drenda Freeze, MD 07/14/19 [phone]

## 2019-07-14 NOTE — Discharge Instructions (Signed)
You swallowed a small part of your denture. It is plastic so xrays will not be helpful.   Observe for trouble breathing or trouble swallowing. You will likely pass it in your stool   See your dentist regarding fitting for another denture   Return to ER if you have trouble breathing or swallowing

## 2019-07-14 NOTE — ED Triage Notes (Signed)
Pt here after swallowing a part of her denture about size of a vitamin. No symptoms present.

## 2019-08-14 ENCOUNTER — Other Ambulatory Visit: Payer: Self-pay | Admitting: Family Medicine

## 2019-09-04 ENCOUNTER — Ambulatory Visit: Payer: Self-pay | Admitting: *Deleted

## 2019-09-25 ENCOUNTER — Ambulatory Visit: Payer: Medicare Other | Admitting: Internal Medicine

## 2019-10-01 ENCOUNTER — Ambulatory Visit: Payer: Medicare Other | Attending: Internal Medicine

## 2019-10-01 DIAGNOSIS — Z23 Encounter for immunization: Secondary | ICD-10-CM | POA: Insufficient documentation

## 2019-10-01 NOTE — Progress Notes (Signed)
   Covid-19 Vaccination Clinic  Name:  Renee [name]    MRN: [id number] DOB: April 14, 1946  10/01/2019  Renee [name] was observed post Covid-19 immunization for 15 minutes without incidence. She was provided with Vaccine Information Sheet and instruction to access the V-Safe system.   Renee [name] was instructed to call 911 with any severe reactions post vaccine: Marland Kitchen Difficulty breathing  . Swelling of your face and throat  . A fast heartbeat  . A bad rash all over your body  . Dizziness and weakness    Immunizations Administered    Name Date Dose VIS Date Route   Pfizer COVID-19 Vaccine 10/01/2019 10:33 AM 0.3 mL 08/03/2019 Intramuscular   Manufacturer: [place]   Lot: [id number]   [place]: [id number]

## 2019-10-05 ENCOUNTER — Encounter: Payer: Self-pay | Admitting: Family Medicine

## 2019-10-19 ENCOUNTER — Ambulatory Visit: Payer: Medicare Other

## 2019-10-22 ENCOUNTER — Other Ambulatory Visit: Payer: Self-pay

## 2019-10-22 ENCOUNTER — Encounter: Payer: Self-pay | Admitting: Family Medicine

## 2019-10-22 ENCOUNTER — Ambulatory Visit (INDEPENDENT_AMBULATORY_CARE_PROVIDER_SITE_OTHER): Payer: Medicare Other | Admitting: Family Medicine

## 2019-10-22 VITALS — BP 162/73 | HR 73 | Temp 96.7°F | Resp 16 | Ht 66.5 in | Wt 213.0 lb

## 2019-10-22 DIAGNOSIS — I10 Essential (primary) hypertension: Secondary | ICD-10-CM

## 2019-10-22 DIAGNOSIS — E785 Hyperlipidemia, unspecified: Secondary | ICD-10-CM | POA: Diagnosis not present

## 2019-10-22 DIAGNOSIS — K5790 Diverticulosis of intestine, part unspecified, without perforation or abscess without bleeding: Secondary | ICD-10-CM | POA: Diagnosis not present

## 2019-10-22 DIAGNOSIS — G250 Essential tremor: Secondary | ICD-10-CM

## 2019-10-22 DIAGNOSIS — R739 Hyperglycemia, unspecified: Secondary | ICD-10-CM | POA: Diagnosis not present

## 2019-10-22 NOTE — Assessment & Plan Note (Addendum)
Encouraged heart healthy diet, increase exercise, avoid trans fats, consider a krill oil cap daily, tolerating Atorvastatin 

## 2019-10-22 NOTE — Progress Notes (Signed)
Subjective:    Patient ID: Renee [name], female    DOB: May 29, 1946, 74 y.o.   MRN: [id number]  Chief Complaint  Patient presents with  . Hypertension    here for follow up  . Wrist Pain    complains of some pain and swelling on right wrist.   . Shortness of Breath    Complains of waking up in the night with SOB. "Not evrery night"    HPI Patient is in today for follow up on chronic medical concerns. No recent febrile illness or hospitalizations. No polyuria or polydipsia. She is continuing with the head trmor but it is stable. No recent acute concerns. She has had a pfizer covid vaccine and tolerated it well. Denies CP/palp/SOB/HA/congestion/fevers/GI or GU c/o. Taking meds as prescribed  Past Medical History:  Diagnosis Date  . Abdominal pain 07/05/2017  . ABNORMAL INVOLUNTARY MOVEMENTS 01/01/2009   Qualifier: Diagnosis of  By: Linna Darner MD, Gwyndolyn Saxon    . Allergic rhinitis   . Allergic rhinitis 09/19/2008   Qualifier: Diagnosis of  By: Wynona Luna   . Allergy   . Cervical cancer screening 05/08/2013   Menarche 11, regular Menopause 45s G2P2 s/p 2 svd No abnormal paps or MGM Fatty cyst on labia removed.   . Cervical pain (neck) 10/07/2015  . CHEST PAIN 09/19/2008   Qualifier: Diagnosis of  By: Wynona Luna   . 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 Concussion 1969  . Diverticulosis 05/06/2017  . Essential hypertension 09/19/2008   Qualifier: Diagnosis of  By: Wynona Luna   . GERD (gastroesophageal reflux disease)   . H/O osteopenia 02/08/2016  . H/O tobacco use, presenting hazards to health 09/19/2008   Qualifier: Diagnosis of  By: Wynona Luna Smoking roughly 5 cig daily  Last cigarette was in December of 2016  . Head tremor 12/27/2016  . 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 Hx of adenomatous colonic polyps 01/27/2018  . Hyperglycemia 10/07/2016  .  Hyperlipidemia   . Hyperlipidemia, mixed 10/03/2008   Qualifier: Diagnosis of  By: Wynona Luna   . Hypertension   . 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 Obesity 07/06/2016  . Occult blood positive stool   . Polyp of sigmoid colon 12/08/2016   11/2016 - Unable to remove or get bx at colonoscopy. Fixed sigmoid colon prevents. Referred for surgery.  Marland Kitchen PONV (postoperative nausea and vomiting)    ,   . Preventative health care 05/12/2013  . 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 Tobacco abuse   . Tremor, essential   . TREMOR, ESSENTIAL 09/19/2008   Qualifier: Diagnosis of  By: Wynona Luna   . Vertigo   . Vertigo     Past Surgical History:  Procedure Laterality Date  . INNER EAR SURGERY  2015   with tube  . MOUTH SURGERY  01/22/11   lower denture secured by titanium bolts  . rectal suction biospy    . XI ROBOTIC ASSISTED LOWER ANTERIOR RESECTION N/A 02/09/2017   Procedure: XI ROBOTIC RESECTION OF SIGMOID COLON, RIGID PROCTOSCOPY;  Surgeon: Michael Boston, MD;  Location: [facility];  Service: General;  Laterality: N/A;    Family History  Problem Relation Age of Onset  .  Heart attack Father 4       deceased  . Stroke Mother   . Hypertension Mother   . Alcohol abuse Mother   . Obesity Daughter   . Heart disease Paternal Grandmother        AAA rupture  . Heart disease Paternal Grandfather        MI  . Colon cancer Neg Hx   . Breast cancer Neg Hx   . Prostate cancer Neg Hx   . Diabetes Neg Hx   . Liver cancer Neg Hx   . Rectal cancer Neg Hx   . Stomach cancer Neg Hx     Social History   Socioeconomic History  . Marital status: Married    Spouse name: Not on file  . Number of children: Not on file  . Years of education: Not on file  . Highest education level: Not on file  Occupational History  . Not on file  Tobacco Use  . Smoking status: Former Smoker    Packs/day: 0.50    Types: Cigarettes    Quit date:  09/07/2015    Years since quitting: 4.1  . Smokeless tobacco: Never Used  Substance and Sexual Activity  . Alcohol use: Yes    Alcohol/week: 14.0 standard drinks    Types: 14 Glasses of wine per week    Comment: 3 glasses of wine daily   . Drug use: No  . Sexual activity: Yes  Other Topics Concern  . Not on file  Social History Narrative  . Not on file   Social Determinants of Health   Financial Resource Strain:   . Difficulty of Paying Living Expenses: Not on file  Food Insecurity:   . Worried About Charity fundraiser in the Last Year: Not on file  . Ran Out of Food in the Last Year: Not on file  Transportation Needs:   . Lack of Transportation (Medical): Not on file  . Lack of Transportation (Non-Medical): Not on file  Physical Activity:   . Days of Exercise per Week: Not on file  . Minutes of Exercise per Session: Not on file  Stress:   . Feeling of Stress : Not on file  Social Connections:   . Frequency of Communication with Friends and Family: Not on file  . Frequency of Social Gatherings with Friends and Family: Not on file  . Attends Religious Services: Not on file  . Active Member of Clubs or Organizations: Not on file  . Attends Archivist Meetings: Not on file  . Marital Status: Not on file  Intimate Partner Violence:   . Fear of Current or Ex-Partner: Not on file  . Emotionally Abused: Not on file  . Physically Abused: Not on file  . Sexually Abused: Not on file    Outpatient Medications Prior to Visit  Medication Sig Dispense Refill  . atorvastatin (LIPITOR) 10 MG tablet TAKE 1/2 TABLET (5 MG TOTAL) BY MOUTH EVERY OTHER DAY. 30 tablet 2  . b complex vitamins tablet Take 1 tablet by mouth every other day.    . Coenzyme Q10 200 MG capsule Take 200 mg by mouth every other day. 3 times per week    . famotidine (PEPCID) 20 MG tablet Take 1 tablet (20 mg total) by mouth daily as needed for heartburn or indigestion. 90 tablet 1  . Lactobacillus  (PROBIOTIC ACIDOPHILUS) CAPS Take by mouth.    . metoprolol succinate (TOPROL-XL) 25 MG 24 hr tablet Take 1  tablet (25 mg total) by mouth daily. 90 tablet 1  . Multiple Vitamin (MULTIVITAMIN WITH MINERALS) TABS tablet Take 1 tablet by mouth every other day.    . Wheat Dextrin (BENEFIBER PO) Take by mouth.     No facility-administered medications prior to visit.    Allergies  Allergen Reactions  . Amoxicillin     REACTION: TROUBLE BREATHING Has patient had a PCN reaction causing immediate rash, facial/tongue/throat swelling, SOB or lightheadedness with hypotension:Yes Has patient had a PCN reaction causing severe rash involving mucus membranes or skin necrosis:No Has patient had a PCN reaction that required hospitalization:No Has patient had a PCN reaction occurring within the last 10 years:No If all of the above answers are "NO", then may proceed with Cephalosporin use.   Abe People Hfa [Albuterol] Other (See Comments)    Hypersensitivity, anxious, disoriented  . Penicillins Rash    Has patient had a PCN reaction causing immediate rash, facial/tongue/throat swelling, SOB or lightheadedness with hypotension:unknown Has patient had a PCN reaction causing severe rash involving mucus membranes or skin necrosis:unknown Has patient had a PCN reaction that required hospitalization:no Has patient had a PCN reaction occurring within the last 10 years:no If all of the above answers are "NO", then may proceed with Cephalosporin use.     Review of Systems  Constitutional: Negative for fever and malaise/fatigue.  HENT: Negative for congestion.   Eyes: Negative for blurred vision.  Respiratory: Negative for shortness of breath.   Cardiovascular: Negative for chest pain, palpitations and leg swelling.  Gastrointestinal: Negative for abdominal pain, blood in stool and nausea.  Genitourinary: Negative for dysuria and frequency.  Musculoskeletal: Negative for falls.  Skin: Negative for rash.    Neurological: Positive for tremors. Negative for dizziness, loss of consciousness and headaches.  Endo/Heme/Allergies: Negative for environmental allergies.  Psychiatric/Behavioral: Negative for depression. The patient is not nervous/anxious.        Objective:    Physical Exam Constitutional:      General: She is not in acute distress.    Appearance: She is not diaphoretic.  HENT:     Head: Normocephalic and atraumatic.     Right Ear: External ear normal.     Left Ear: External ear normal.     Nose: Nose normal.     Mouth/Throat:     Pharynx: No oropharyngeal exudate.  Eyes:     General: No scleral icterus.       Right eye: No discharge.        Left eye: No discharge.     Conjunctiva/sclera: Conjunctivae normal.     Pupils: Pupils are equal, round, and reactive to light.  Neck:     Thyroid: No thyromegaly.  Cardiovascular:     Rate and Rhythm: Normal rate and regular rhythm.     Heart sounds: Normal heart sounds. No murmur.  Pulmonary:     Effort: Pulmonary effort is normal. No respiratory distress.     Breath sounds: Normal breath sounds. No wheezing or rales.  Abdominal:     General: Bowel sounds are normal. There is no distension.     Palpations: Abdomen is soft. There is no mass.     Tenderness: There is no abdominal tenderness.  Musculoskeletal:        General: No tenderness. Normal range of motion.     Cervical back: Normal range of motion and neck supple.  Lymphadenopathy:     Cervical: No cervical adenopathy.  Skin:    General: Skin is  warm and dry.     Findings: No rash.  Neurological:     Mental Status: She is alert and oriented to person, place, and time.     Cranial Nerves: No cranial nerve deficit.     Coordination: Coordination normal.     Deep Tendon Reflexes: Reflexes are normal and symmetric. Reflexes normal.     BP (!) 162/73 (BP Location: Left Arm, Patient Position: Sitting, Cuff Size: Small)   Pulse 73   Temp (!) 96.7 F (35.9 C)  (Temporal)   Resp 16   Ht 5' 6.5" (1.689 m)   Wt 213 lb (96.6 kg)   SpO2 98%   BMI 33.86 kg/m  Wt Readings from Last 3 Encounters:  10/22/19 213 lb (96.6 kg)  04/23/19 206 lb 6.4 oz (93.6 kg)  09/14/18 205 lb 6.4 oz (93.2 kg)    Diabetic Foot Exam - Simple   No data filed     Lab Results  Component Value Date   WBC 7.0 04/23/2019   HGB 14.9 04/23/2019   HCT 44.3 04/23/2019   PLT 231.0 04/23/2019   GLUCOSE 87 04/23/2019   CHOL 231 (H) 04/23/2019   TRIG 109.0 04/23/2019   HDL 78.30 04/23/2019   LDLDIRECT 148.6 09/26/2008   LDLCALC 131 (H) 04/23/2019   ALT 23 04/23/2019   AST 19 04/23/2019   NA 139 04/23/2019   K 4.2 04/23/2019   CL 103 04/23/2019   CREATININE 0.66 04/23/2019   BUN 13 04/23/2019   CO2 26 04/23/2019   TSH 1.52 04/23/2019   HGBA1C 6.0 04/23/2019    Lab Results  Component Value Date   TSH 1.52 04/23/2019   Lab Results  Component Value Date   WBC 7.0 04/23/2019   HGB 14.9 04/23/2019   HCT 44.3 04/23/2019   MCV 101.5 (H) 04/23/2019   PLT 231.0 04/23/2019   Lab Results  Component Value Date   NA 139 04/23/2019   K 4.2 04/23/2019   CO2 26 04/23/2019   GLUCOSE 87 04/23/2019   BUN 13 04/23/2019   CREATININE 0.66 04/23/2019   BILITOT 0.9 04/23/2019   ALKPHOS 91 04/23/2019   AST 19 04/23/2019   ALT 23 04/23/2019   PROT 6.8 04/23/2019   ALBUMIN 4.2 04/23/2019   CALCIUM 9.1 04/23/2019   ANIONGAP 6 02/10/2017   GFR 87.73 04/23/2019   Lab Results  Component Value Date   CHOL 231 (H) 04/23/2019   Lab Results  Component Value Date   HDL 78.30 04/23/2019   Lab Results  Component Value Date   LDLCALC 131 (H) 04/23/2019   Lab Results  Component Value Date   TRIG 109.0 04/23/2019   Lab Results  Component Value Date   CHOLHDL 3 04/23/2019   Lab Results  Component Value Date   HGBA1C 6.0 04/23/2019       Assessment & Plan:   Problem List Items Addressed This Visit    Hyperlipidemia - Primary    Encouraged heart healthy diet,  increase exercise, avoid trans fats, consider a krill oil cap daily, tolerating Atorvastatin      Relevant Orders   Lipid panel   Essential hypertension    Well controlled, no changes to meds. Encouraged heart healthy diet such as the DASH diet and exercise as tolerated.       Relevant Orders   CBC   Comprehensive metabolic panel   TSH   Hyperglycemia    hgba1c acceptable, minimize simple carbs. Increase exercise as tolerated.  Relevant Orders   Hemoglobin A1c   Head tremor    Persistent but not worsened. Discussed referral to neurology but will not proceed at this time. Patient will contact us if she chooses to proceed with neurology consult      Diverticulosis      I am having Martha L. Housman maintain her b complex vitamins, multivitamin with minerals, Coenzyme Q10, famotidine, metoprolol succinate, atorvastatin, Probiotic Acidophilus, and Wheat Dextrin (BENEFIBER PO).  No orders of the defined types were placed in this encounter.    Penni Homans, MD

## 2019-10-22 NOTE — Patient Instructions (Signed)
.  The mRNA technology has been in development for 20 years and we already had the Coronavirus family of viruses (which usually just cause the common cold) genetically mapped already which is why we were able to come up with viable vaccine candidates so quickly in stage 1, then stage 2 scientifically took the correct amount of time what we did to speed it up was just build the manufacturing platform at the same time we were running the experiments so if it worked we could produce faster. And stage 3 has now had many months and millions of people immunized and we are seeing the immunity hold for over 9 months now with sign of it dissipating and no significant numbers of adverse reactions.  During every flu season we see 2 anaphylactic reactions for every million shots given and we initially thought we would see 11 per million with the COVID vaccine but now we see only 2-3 with Moderna and 5 or so with [place] so compared to someone is dying every 20 minutes from [place] and more deadly and infectious strains are coming it is definitely best when weighing the risks and benefits to take the shots.  Another pooled analysis of the 5 most utilized vaccines in the world shows that after full immunization so far no one has died from [place].   Omron Blood Pressure cuff, upper arm, want BP 100-140/60-90 Pulse oximeter, want oxygen in 90s  Weekly vitals  Take Multivitamin with minerals, selenium Vitamin D 1000-2000 IU daily Probiotic with lactobacillus and bifidophilus Asprin EC 81 mg daily  Melatonin 2-5 mg at bedtime  [email or web address]

## 2019-10-22 NOTE — Assessment & Plan Note (Signed)
Well controlled, no changes to meds. Encouraged heart healthy diet such as the DASH diet and exercise as tolerated.  °

## 2019-10-22 NOTE — Assessment & Plan Note (Signed)
Persistent but not worsened. Discussed referral to neurology but will not proceed at this time. Patient will contact us if she chooses to proceed with neurology consult

## 2019-10-22 NOTE — Assessment & Plan Note (Signed)
hgba1c acceptable, minimize simple carbs. Increase exercise as tolerated.  

## 2019-10-23 LAB — LIPID PANEL
Cholesterol: 222 mg/dL — ABNORMAL HIGH (ref 0–200)
HDL: 72.8 mg/dL (ref >39.00)
LDL Cholesterol: 123 mg/dL — ABNORMAL HIGH (ref 0–99)
NonHDL: 148.85
Total CHOL/HDL Ratio: 3
Triglycerides: 127 mg/dL (ref 0.0–149.0)
VLDL: 25.4 mg/dL (ref 0.0–40.0)

## 2019-10-23 LAB — COMPREHENSIVE METABOLIC PANEL
ALT: 23 U/L (ref 0–35)
AST: 19 U/L (ref 0–37)
Albumin: 4.1 g/dL (ref 3.5–5.2)
Alkaline Phosphatase: 92 U/L (ref 39–117)
BUN: 13 mg/dL (ref 6–23)
CO2: 27 mEq/L (ref 19–32)
Calcium: 9.5 mg/dL (ref 8.4–10.5)
Chloride: 101 mEq/L (ref 96–112)
Creatinine, Ser: 0.72 mg/dL (ref 0.40–1.20)
GFR: 79.24 mL/min (ref >60.00)
Glucose, Bld: 91 mg/dL (ref 70–99)
Potassium: 4.3 mEq/L (ref 3.5–5.1)
Sodium: 139 mEq/L (ref 135–145)
Total Bilirubin: 0.7 mg/dL (ref 0.2–1.2)
Total Protein: 7.1 g/dL (ref 6.0–8.3)

## 2019-10-23 LAB — CBC
HCT: 44 % (ref 36.0–46.0)
Hemoglobin: 14.8 g/dL (ref 12.0–15.0)
MCHC: 33.6 g/dL (ref 30.0–36.0)
MCV: 102.6 fl — ABNORMAL HIGH (ref 78.0–100.0)
Platelets: 255 10*3/uL (ref 150.0–400.0)
RBC: 4.29 Mil/uL (ref 3.87–5.11)
RDW: 14.1 % (ref 11.5–15.5)
WBC: 7.4 10*3/uL (ref 4.0–10.5)

## 2019-10-23 LAB — HEMOGLOBIN A1C: Hgb A1c MFr Bld: 5.8 % (ref 4.6–6.5)

## 2019-10-23 LAB — TSH: TSH: 1.29 u[IU]/mL (ref 0.35–4.50)

## 2019-10-24 ENCOUNTER — Ambulatory Visit: Payer: Medicare Other

## 2019-10-24 ENCOUNTER — Ambulatory Visit: Payer: Medicare Other | Attending: Internal Medicine

## 2019-10-24 DIAGNOSIS — Z23 Encounter for immunization: Secondary | ICD-10-CM | POA: Insufficient documentation

## 2019-10-24 NOTE — Progress Notes (Signed)
   Covid-19 Vaccination Clinic  Name:  Renee [name]    MRN: [id number] DOB: Jun 26, 1946  10/24/2019  [name] was observed post Covid-19 immunization for 30 minutes based on pre-vaccination screening without incident. She was provided with Vaccine Information Sheet and instruction to access the V-Safe system.   [name] was instructed to call 911 with any severe reactions post vaccine: Marland Kitchen Difficulty breathing  . Swelling of face and throat  . A fast heartbeat  . A bad rash all over body  . Dizziness and weakness   Immunizations Administered    Name Date Dose VIS Date Route   Pfizer COVID-19 Vaccine 10/24/2019  8:27 AM 0.3 mL 08/03/2019 Intramuscular   Manufacturer: [place]   Lot: [id number]   [place]: [id number]

## 2019-10-28 ENCOUNTER — Other Ambulatory Visit: Payer: Self-pay | Admitting: Family Medicine

## 2020-01-23 ENCOUNTER — Encounter: Payer: Self-pay | Admitting: Family Medicine

## 2020-01-30 ENCOUNTER — Ambulatory Visit (INDEPENDENT_AMBULATORY_CARE_PROVIDER_SITE_OTHER): Payer: Medicare Other | Admitting: Family Medicine

## 2020-01-30 ENCOUNTER — Encounter: Payer: Self-pay | Admitting: Family Medicine

## 2020-01-30 ENCOUNTER — Other Ambulatory Visit: Payer: Self-pay

## 2020-01-30 VITALS — BP 140/82 | HR 58 | Temp 96.1°F | Ht 66.5 in | Wt 212.4 lb

## 2020-01-30 DIAGNOSIS — M25531 Pain in right wrist: Secondary | ICD-10-CM | POA: Diagnosis not present

## 2020-01-30 NOTE — Progress Notes (Signed)
Musculoskeletal Exam  Patient: Renee [name] DOB: 24-Feb-1946  DOS: 01/30/2020  SUBJECTIVE:  Chief Complaint:   Chief Complaint  Patient presents with  . Wrist Pain    right wrist    Renee [name] is a 74 y.o.  female for evaluation and treatment of R wrist pain.   Onset:  1 year ago. No inj or change in activity initially, fell on it 5 mo ago.  Location: volar lateral wrist Character:  shooting  Progression of issue:  has improved Associated symptoms: Had some swelling/growth on volar surface that has reduced; no bruising, redness, or decreased ROM Treatment: to date has been rest.   Neurovascular symptoms: no   Past Medical History:  Diagnosis Date  . Abdominal pain 07/05/2017  . ABNORMAL INVOLUNTARY MOVEMENTS 01/01/2009   Qualifier: Diagnosis of  By: Linna Darner MD, Gwyndolyn Saxon    . Allergic rhinitis   . Allergic rhinitis 09/19/2008   Qualifier: Diagnosis of  By: Wynona Luna   . Allergy   . Cervical cancer screening 05/08/2013   Menarche 11, regular Menopause 23s G2P2 s/p 2 svd No abnormal paps or MGM Fatty cyst on labia removed.   . Cervical pain (neck) 10/07/2015  . CHEST PAIN 09/19/2008   Qualifier: Diagnosis of  By: Wynona Luna   . 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 Concussion 1969  . Diverticulosis 05/06/2017  . Essential hypertension 09/19/2008   Qualifier: Diagnosis of  By: Wynona Luna   . GERD (gastroesophageal reflux disease)   . H/O osteopenia 02/08/2016  . H/O tobacco use, presenting hazards to health 09/19/2008   Qualifier: Diagnosis of  By: Wynona Luna Smoking roughly 5 cig daily  Last cigarette was in December of 2016  . Head tremor 12/27/2016  . 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 Hx of adenomatous colonic polyps 01/27/2018  . Hyperglycemia 10/07/2016  . Hyperlipidemia   . Hyperlipidemia, mixed 10/03/2008   Qualifier: Diagnosis of  By:  Wynona Luna   . Hypertension   . 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 Obesity 07/06/2016  . Occult blood positive stool   . Polyp of sigmoid colon 12/08/2016   11/2016 - Unable to remove or get bx at colonoscopy. Fixed sigmoid colon prevents. Referred for surgery.  Marland Kitchen PONV (postoperative nausea and vomiting)    ,   . Preventative health care 05/12/2013  . 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 Tobacco abuse   . Tremor, essential   . TREMOR, ESSENTIAL 09/19/2008   Qualifier: Diagnosis of  By: Wynona Luna   . Vertigo   . Vertigo     Objective: VITAL SIGNS: BP 140/82 (BP Location: Left Arm, Patient Position: Sitting, Cuff Size: Normal)   Pulse (!) 58   Temp (!) 96.1 F (35.6 C) (Temporal)   Ht 5' 6.5" (1.689 m)   Wt 212 lb 6 oz (96.3 kg)   SpO2 97%   BMI 33.76 kg/m  Constitutional: Well formed, well developed. No acute distress. Cardiovascular: Brisk cap refill Thorax & Lungs: No accessory muscle use Musculoskeletal: R wrist.   Normal active range of motion: yes.   Normal passive range of motion: yes Tenderness to palpation: no Deformity: mild hypertrophy on lateral volar surface of  wrist Ecchymosis: no Neurologic: Normal sensory function.  Psychiatric: Normal mood. Age appropriate judgment and insight. Alert & oriented x 3.    Assessment:  Right wrist pain  Plan: As the area of interest has decreased in size, doubt bony change or tumor. Likely synovial changes. Cont brace prn.  Stretches and exercises provided on an as-needed basis.  Let us know if anything changes. F/u as needed with me and as originally scheduled with her regular PCP. The patient voiced understanding and agreement to the plan.   [place], DO 01/30/20  12:04 PM

## 2020-01-30 NOTE — Patient Instructions (Signed)
I think this could be part of the joint or possibly a bone (more likely a joint).  Consider ice.   Wear the brace as needed.  Let me know if things worsen/you have shooting pain again.   Wrist and Forearm Exercises Do exercises exactly as told by your health care provider and adjust them as directed. It is normal to feel mild stretching, pulling, tightness, or discomfort as you do these exercises, but you should stop right away if you feel sudden pain or your pain gets worse.   RANGE OF MOTION EXERCISES These exercises warm up your muscles and joints and improve the movement and flexibility of your injured wrist and forearm. These exercises also help to relieve pain, numbness, and tingling. These exercises are done using the muscles in your injured wrist and forearm. Exercise A: Wrist Flexion, Active 1. With your fingers relaxed, bend your wrist forward as far as you can. 2. Hold this position for 30 seconds. Repeat 2 times. Complete this exercise 3 times per week. Exercise B: Wrist Extension, Active 1. With your fingers relaxed, bend your wrist backward as far as you can. 2. Hold this position for 30 seconds. Repeat 2 times. Complete this exercise 3 times per week. Exercise C: Supination, Active  1. Stand or sit with your arms at your sides. 2. Bend your left / right elbow to an "L" shape (90 degrees). 3. Turn your palm upward until you feel a gentle stretch on the inside of your forearm. 4. Hold this position for 30 seconds. 5. Slowly return your palm to the starting position. Repeat 2 times. Complete this exercise 3 times per week. Exercise D: Pronation, Active  1. Stand or sit with your arms at your sides. 2. Bend your left / right elbow to an "L" shape (90 degrees). 3. Turn your palm downward until you feel a gentle stretch on the top of your forearm. 4. Hold this position for 30 seconds. 5. Slowly return your palm to the starting position. Repeat 2 times. Complete this  exercise once a day.  STRETCHING EXERCISES These exercises warm up your muscles and joints and improve the movement and flexibility of your injured wrist and forearm. These exercises also help to relieve pain, numbness, and tingling. These exercises are done using your healthy wrist and forearm to help stretch the muscles in your injured wrist and forearm. Exercise E: Wrist Flexion, Passive  1. Extend your left / right arm in front of you, relax your wrist, and point your fingers downward. 2. Gently push on the back of your hand. Stop when you feel a gentle stretch on the top of your forearm. 3. Hold this position for 30 seconds. Repeat 2 times. Complete this exercise 3 times per week. Exercise F: Wrist Extension, Passive  1. Extend your left / right arm in front of you and turn your palm upward. 2. Gently pull your palm and fingertips back so your fingers point downward. You should feel a gentle stretch on the palm-side of your forearm. 3. Hold this position for 30 seconds. Repeat 2 times. Complete this exercise 3 times per week. Exercise G: Forearm Rotation, Supination, Passive 1. Sit with your left / right elbow bent to an "L" shape (90 degrees) with your forearm resting on a table. 2. Keeping your upper body and shoulder still, use your other hand to rotate your forearm palm-up until you feel a gentle to moderate stretch. 3. Hold this position for 30 seconds. 4. Slowly release the  stretch and return to the starting position. Repeat 2 times. Complete this exercise 3 times per week. Exercise H: Forearm Rotation, Pronation, Passive 1. Sit with your left / right elbow bent to an "L" shape (90 degrees) with your forearm resting on a table. 2. Keeping your upper body and shoulder still, use your other hand to rotate your forearm palm-down until you feel a gentle to moderate stretch. 3. Hold this position for 30 seconds. 4. Slowly release the stretch and return to the starting  position. Repeat 2 times. Complete this exercise 3 times per week.  STRENGTHENING EXERCISES These exercises build strength and endurance in your wrist and forearm. Endurance is the ability to use your muscles for a long time, even after they get tired. Exercise I: Wrist Flexors  1. Sit with your left / right forearm supported on a table and your hand resting palm-up over the edge of the table. Your elbow should be bent to an "L" shape (about 90 degrees) and be below the level of your shoulder. 2. Hold a 3-5 lb weight in your left / right hand. Or, hold a rubber exercise band or tube in both hands, keeping your hands at the same level and hip distance apart. There should be a slight tension in the exercise band or tube. 3. Slowly curl your hand up toward your forearm. 4. Hold this position for 3 seconds. 5. Slowly lower your hand back to the starting position. Repeat 2 times. Complete this exercise 3 times per week. Exercise J: Wrist Extensors  1. Sit with your left / right forearm supported on a table and your hand resting palm-down over the edge of the table. Your elbow should be bent to an "L" shape (about 90 degrees) and be below the level of your shoulder. 2. Hold a 3-5 lb weight in your left / right hand. Or, hold a rubber exercise band or tube in both hands, keeping your hands at the same level and hip distance apart. There should be a slight tension in the exercise band or tube. 3. Slowly curl your hand up toward your forearm. 4. Hold this position for 3 seconds. 5. Slowly lower your hand back to the starting position. Repeat 2 times. Complete this exercise 3 times per week. Exercise K: Forearm Rotation, Supination  1. Sit with your left / right forearm supported on a table and your hand resting palm-down. Your elbow should be at your side, bent to an "L" shape (about 90 degrees), and below the level of your shoulder. Keep your wrist stable and in a neutral position throughout the  exercise. 2. Gently hold a lightweight hammer with your left / right hand. 3. Without moving your elbow or wrist, slowly rotate your palm upward to a thumbs-up position. 4. Hold this position for 3 seconds. 5. Slowly return your forearm to the starting position. Repeat 2 times. Complete this exercise 3 times per week. Exercise L: Forearm Rotation, Pronation  1. Sit with your left / right forearm supported on a table and your hand resting palm-up. Your elbow should be at your side, bent to an "L" shape (about 90 degrees), and below the level of your shoulder. Keep your wrist stable. Do not allow it to move backward or forward during the exercise. 2. Gently hold a lightweight hammer with your left / right hand. 3. Without moving your elbow or wrist, slowly rotate your palm and hand upward to a thumbs-up position. 4. Hold this position for 3 seconds.  5. Slowly return your forearm to the starting position. Repeat 2 times. Complete this exercise 3 times per week. Exercise M: Grip Strengthening  1. Hold one of these items in your left / right hand: play dough, therapy putty, a dense sponge, a stress ball, or a large, rolled sock. 2. Squeeze as hard as you can without increasing pain. 3. Hold this position for 5 seconds. 4. Slowly release your grip. Repeat 2 times. Complete this exercise 3 times per week.  This information is not intended to replace advice given to you by your health care provider. Make sure you discuss any questions you have with your health care provider. Document Released: 06/23/2005 Document Revised: 05/03/2016 Document Reviewed: 05/04/2015 [employer] Interactive Patient Education  [employer].

## 2020-03-07 ENCOUNTER — Ambulatory Visit (INDEPENDENT_AMBULATORY_CARE_PROVIDER_SITE_OTHER): Payer: Medicare Other | Admitting: *Deleted

## 2020-03-07 ENCOUNTER — Other Ambulatory Visit: Payer: Self-pay

## 2020-03-07 ENCOUNTER — Encounter: Payer: Self-pay | Admitting: *Deleted

## 2020-03-07 DIAGNOSIS — Z1231 Encounter for screening mammogram for malignant neoplasm of breast: Secondary | ICD-10-CM

## 2020-03-07 DIAGNOSIS — Z0000 Encounter for general adult medical examination without abnormal findings: Secondary | ICD-10-CM | POA: Diagnosis not present

## 2020-03-07 NOTE — Progress Notes (Signed)
I connected with Renee [name] today by telephone and verified that I am speaking with the correct person using two identifiers. Location patient: home Location provider: work Persons participating in the virtual visit: patient, Marine scientist.    I discussed the limitations, risks, security and privacy concerns of performing an evaluation and management service by telephone and the availability of in person appointments. I also discussed with the patient that there may be a patient responsible charge related to this service. The patient expressed understanding and verbally consented to this telephonic visit.    Interactive audio and video telecommunications were attempted between this provider and patient, however failed, due to patient having technical difficulties OR patient did not have access to video capability.  We continued and completed visit with audio only.  Some vital signs may be absent or patient reported.    Subjective:   Renee [name] is a 74 y.o. female who presents for Medicare Annual (Subsequent) preventive examination.  Review of Systems    Cardiac Risk Factors include: advanced age (>43men, >58 women)     Objective:      Advanced Directives 03/07/2020 07/14/2019 08/29/2018 01/17/2018 02/09/2017 02/09/2017 02/02/2017  Does Patient Have a Medical Advance Directive? Yes No Yes No Yes Yes Yes  Type of Paramedic of [place];Living will - [place];Living will - [place];Living will  Does patient want to make changes to medical advance directive? No - Patient declined - No - Patient declined - No - Patient declined - -  Copy of [place] in Chart? Yes - validated most recent copy scanned in chart (See row information) - Yes - validated most recent copy scanned in chart (See row information) - Yes No - copy requested No - copy requested  Would patient like information on creating a  medical advance directive? - - - No - Patient declined - - -    Current Medications (verified) Outpatient Encounter Medications as of 03/07/2020  Medication Sig  . atorvastatin (LIPITOR) 10 MG tablet TAKE 1/2 TABLET (5 MG TOTAL) BY MOUTH EVERY OTHER DAY.  Marland Kitchen b complex vitamins tablet Take 1 tablet by mouth every other day.  . Coenzyme Q10 200 MG capsule Take 200 mg by mouth every other day. 3 times per week  . famotidine (PEPCID) 20 MG tablet Take 1 tablet (20 mg total) by mouth daily as needed for heartburn or indigestion.  . Lactobacillus (PROBIOTIC ACIDOPHILUS) CAPS Take by mouth.  . metoprolol succinate (TOPROL-XL) 25 MG 24 hr tablet TAKE 1 TABLET BY MOUTH EVERY DAY  . Multiple Vitamin (MULTIVITAMIN WITH MINERALS) TABS tablet Take 1 tablet by mouth every other day.  . Wheat Dextrin (BENEFIBER PO) Take by mouth.   No facility-administered encounter medications on file as of 03/07/2020.    Allergies (verified) Amoxicillin, Proair hfa [albuterol], and Penicillins   History: Past Medical History:  Diagnosis Date  . Abdominal pain 07/05/2017  . ABNORMAL INVOLUNTARY MOVEMENTS 01/01/2009   Qualifier: Diagnosis of  By: Linna Darner MD, Gwyndolyn Saxon    . Allergic rhinitis   . Allergic rhinitis 09/19/2008   Qualifier: Diagnosis of  By: Wynona Luna   . Allergy   . Cervical cancer screening 05/08/2013   Menarche 11, regular Menopause 45s G2P2 s/p 2 svd No abnormal paps or MGM Fatty cyst on labia removed.   . Cervical pain (neck) 10/07/2015  . CHEST PAIN 09/19/2008   Qualifier: Diagnosis of  By:  Wynona Luna   . 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 Concussion 1969  . Diverticulosis 05/06/2017  . Essential hypertension 09/19/2008   Qualifier: Diagnosis of  By: Wynona Luna   . GERD (gastroesophageal reflux disease)   . H/O osteopenia 02/08/2016  . H/O tobacco use, presenting hazards to health 09/19/2008   Qualifier: Diagnosis of  By:  Wynona Luna Smoking roughly 5 cig daily  Last cigarette was in December of 2016  . Head tremor 12/27/2016  . 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 Hx of adenomatous colonic polyps 01/27/2018  . Hyperglycemia 10/07/2016  . Hyperlipidemia   . Hyperlipidemia, mixed 10/03/2008   Qualifier: Diagnosis of  By: Wynona Luna   . Hypertension   . 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 Obesity 07/06/2016  . Occult blood positive stool   . Polyp of sigmoid colon 12/08/2016   11/2016 - Unable to remove or get bx at colonoscopy. Fixed sigmoid colon prevents. Referred for surgery.  Marland Kitchen PONV (postoperative nausea and vomiting)    ,   . Preventative health care 05/12/2013  . 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 Tobacco abuse   . Tremor, essential   . TREMOR, ESSENTIAL 09/19/2008   Qualifier: Diagnosis of  By: Wynona Luna   . Vertigo   . Vertigo    Past Surgical History:  Procedure Laterality Date  . INNER EAR SURGERY  2015   with tube  . MOUTH SURGERY  01/22/11   lower denture secured by titanium bolts  . rectal suction biospy    . XI ROBOTIC ASSISTED LOWER ANTERIOR RESECTION N/A 02/09/2017   Procedure: XI ROBOTIC RESECTION OF SIGMOID COLON, RIGID PROCTOSCOPY;  Surgeon: Michael Boston, MD;  Location: [facility];  Service: General;  Laterality: N/A;   Family History  Problem Relation Age of Onset  . Heart attack Father 11       deceased  . Stroke Mother   . Hypertension Mother   . Alcohol abuse Mother   . Obesity Daughter   . Heart disease Paternal Grandmother        AAA rupture  . Heart disease Paternal Grandfather        MI  . Colon cancer Neg Hx   . Breast cancer Neg Hx   . Prostate cancer Neg Hx   . Diabetes Neg Hx   . Liver cancer Neg Hx   . Rectal cancer Neg Hx   . Stomach cancer Neg Hx    Social History   Socioeconomic History  . Marital status: Married    Spouse  name: Not on file  . Number of children: Not on file  . Years of education: Not on file  . Highest education level: Not on file  Occupational History  . Not on file  Tobacco Use  . Smoking status: Former Smoker    Packs/day: 0.50    Types: Cigarettes    Quit date: 09/07/2015    Years since quitting: 4.5  . Smokeless tobacco: Never Used  Vaping Use  . Vaping Use: Never used  Substance and Sexual Activity  . Alcohol use: Yes    Alcohol/week: 14.0 standard drinks    Types: 14 Glasses of wine per week  Comment: 3 glasses of wine daily   . Drug use: No  . Sexual activity: Yes  Other Topics Concern  . Not on file  Social History Narrative  . Not on file   Social Determinants of Health   Financial Resource Strain: Low Risk   . Difficulty of Paying Living Expenses: Not hard at all  Food Insecurity: No Food Insecurity  . Worried About Charity fundraiser in the Last Year: Never true  . Ran Out of Food in the Last Year: Never true  Transportation Needs: No Transportation Needs  . Lack of Transportation (Medical): No  . Lack of Transportation (Non-Medical): No  Physical Activity:   . Days of Exercise per Week:   . Minutes of Exercise per Session:   Stress:   . Feeling of Stress :   Social Connections:   . Frequency of Communication with Friends and Family:   . Frequency of Social Gatherings with Friends and Family:   . Attends Religious Services:   . Active Member of Clubs or Organizations:   . Attends Archivist Meetings:   Marland Kitchen Marital Status:     Tobacco Counseling Counseling given: Not Answered   Clinical Intake: Pain : No/denies pain    Activities of Daily Living In your present state of health, do you have any difficulty performing the following activities: 03/07/2020 10/22/2019  Hearing? Y N  Comment DIFFICULTY HEARING OUT OF RIGHT EAR -  Vision? N N  Difficulty concentrating or making decisions? N N  Walking or climbing stairs? N N  Dressing or  bathing? N N  Doing errands, shopping? N N  Preparing Food and eating ? N -  Using the Toilet? N -  In the past six months, have you accidently leaked urine? N -  Do you have problems with loss of bowel control? N -  Managing your Medications? N -  Managing your Finances? N -  Housekeeping or managing your Housekeeping? N -  Some recent data might be hidden    Patient Care Team: [name], MD as PCP - General (Family Medicine) Melissa Montane, MD as Consulting Physician (Otolaryngology) Gatha Mayer, MD as Consulting Physician (Gastroenterology) Dr Shanon Brow Daughtry (Optometry) Michael Boston, MD as Consulting Physician (General Surgery) Love, Alyson Locket, MD as Consulting Physician (Neurology)  Indicate any recent Medical Services you may have received from other than [facility] providers in the past year (date may be approximate).     Assessment:   This is a routine wellness examination for [place].  Dietary issues and exercise activities discussed: Current Exercise Habits: Home exercise routine, Time (Minutes): 30, Frequency (Times/Week): 3, Weekly Exercise (Minutes/Week): 90, Intensity: Mild, Exercise limited by: None identified Diet (meal preparation, eat out, water intake, caffeinated beverages, dairy products, fruits and vegetables): well balanced   Goals    . Increase water intake    . Reduce alcohol intake     . Reduce sodium intake     DASH Eating Plan DASH stands for "Dietary Approaches to Stop Hypertension." The DASH eating plan is a healthy eating plan that has been shown to reduce high blood pressure (hypertension). Additional health benefits may include reducing the risk of type 2 diabetes mellitus, heart disease, and stroke. The DASH eating plan may also help with weight loss. What do I need to know about the DASH eating plan? For the DASH eating plan, you will follow these general guidelines:  Choose foods with less than 150 milligrams of sodium  per serving (as listed  on the food label).  Use salt-free seasonings or herbs instead of table salt or sea salt.  Check with your health care provider or pharmacist before using salt substitutes.  Eat lower-sodium products. These are often labeled as "low-sodium" or "no salt added."  Eat fresh foods. Avoid eating a lot of canned foods.  Eat more vegetables, fruits, and low-fat dairy products.  Choose whole grains. Look for the word "whole" as the first word in the ingredient list.  Choose fish and skinless chicken or [place] more often than red meat. Limit fish, poultry, and meat to 6 oz (170 g) each day.  Limit sweets, desserts, sugars, and sugary drinks.  Choose heart-healthy fats.  Eat more home-cooked food and less restaurant, buffet, and fast food.  Limit fried foods.  Do not fry foods. Cook foods using methods such as baking, boiling, grilling, and broiling instead.  When eating at a restaurant, ask that your food be prepared with less salt, or no salt if possible. What foods can I eat? Seek help from a dietitian for individual calorie needs. Grains  Whole grain or whole wheat bread. Brown rice. Whole grain or whole wheat pasta. Quinoa, bulgur, and whole grain cereals. Low-sodium cereals. Corn or whole wheat flour tortillas. Whole grain cornbread. Whole grain crackers. Low-sodium crackers. Vegetables  Fresh or frozen vegetables (raw, steamed, roasted, or grilled). Low-sodium or reduced-sodium tomato and vegetable juices. Low-sodium or reduced-sodium tomato sauce and paste. Low-sodium or reduced-sodium canned vegetables. Fruits  All fresh, canned (in natural juice), or frozen fruits. Meat and Other Protein Products  Ground beef (85% or leaner), grass-fed beef, or beef trimmed of fat. Skinless chicken or [place]. Ground chicken or [place]. Pork trimmed of fat. All fish and seafood. Eggs. Dried beans, peas, or lentils. Unsalted nuts and seeds. Unsalted canned beans. Dairy  Low-fat dairy products, such  as skim or 1% milk, 2% or reduced-fat cheeses, low-fat ricotta or cottage cheese, or plain low-fat yogurt. Low-sodium or reduced-sodium cheeses. Fats and Oils  Tub margarines without trans fats. Light or reduced-fat mayonnaise and salad dressings (reduced sodium). Avocado. Safflower, olive, or canola oils. Natural peanut or almond butter. Other  Unsalted popcorn and pretzels. The items listed above may not be a complete list of recommended foods or beverages. Contact your dietitian for more options.  What foods are not recommended? Grains  White bread. White pasta. White rice. Refined cornbread. Bagels and croissants. Crackers that contain trans fat. Vegetables  Creamed or fried vegetables. Vegetables in a cheese sauce. Regular canned vegetables. Regular canned tomato sauce and paste. Regular tomato and vegetable juices. Fruits  Canned fruit in light or heavy syrup. Fruit juice. Meat and Other Protein Products  Fatty cuts of meat. Ribs, chicken wings, bacon, sausage, bologna, salami, chitterlings, fatback, hot dogs, bratwurst, and packaged luncheon meats. Salted nuts and seeds. Canned beans with salt. Dairy  Whole or 2% milk, cream, half-and-half, and cream cheese. Whole-fat or sweetened yogurt. Full-fat cheeses or blue cheese. Nondairy creamers and whipped toppings. Processed cheese, cheese spreads, or cheese curds. Condiments  Onion and garlic salt, seasoned salt, table salt, and sea salt. Canned and packaged gravies. Worcestershire sauce. Tartar sauce. Barbecue sauce. Teriyaki sauce. Soy sauce, including reduced sodium. Steak sauce. Fish sauce. Oyster sauce. Cocktail sauce. Horseradish. Ketchup and mustard. Meat flavorings and tenderizers. Bouillon cubes. Hot sauce. Tabasco sauce. Marinades. Taco seasonings. Relishes. Fats and Oils  Butter, stick margarine, lard, shortening, ghee, and bacon fat. Coconut, palm kernel, or  palm oils. Regular salad dressings. Other  Pickles and olives. Salted  popcorn and pretzels. The items listed above may not be a complete list of foods and beverages to avoid. Contact your dietitian for more information.  Where can I find more information? National Heart, Lung, and Blood Institute: travelstabloid.com This information is not intended to replace advice given to you by your health care provider. Make sure you discuss any questions you have with your health care provider. Document Released: 07/29/2011 Document Revised: 01/15/2016 Document Reviewed: 06/13/2013 Elsevier Interactive Patient Education  2017 [place] 2/9 Scores 08/29/2018 06/29/2018 10/07/2016 02/18/2016 10/03/2015 05/12/2013  PHQ - 2 Score 0 0 0 0 0 0  PHQ- 9 Score - 0 - - - -    Fall Risk Fall Risk  03/07/2020 08/29/2018 06/29/2018 10/07/2016 02/18/2016  Falls in the past year? 0 0 1 Yes No  Number falls in past yr: 0 - 0 1 -  Injury with Fall? 0 - 1 No -  Risk for fall due to : - - - History of fall(s) -  Follow up Education provided;Falls prevention discussed - - Education provided;Falls prevention discussed -   Lives w/ husband in 2 story home.  Any stairs in or around the home? Yes  If so, are there any without handrails? No  Home free of loose throw rugs in walkways, pet beds, electrical cords, etc? Yes  Adequate lighting in your home to reduce risk of falls? Yes   ASSISTIVE DEVICES UTILIZED TO PREVENT FALLS:  Life alert? No  Use of a cane, walker or w/c? No  Grab bars in the bathroom? Yes  Shower chair or bench in shower? No  Elevated toilet seat or a handicapped toilet? No    Cognitive Function: Ad8 score reviewed for issues:  Issues making decisions:NO  Less interest in hobbies / activities:NO  Repeats questions, stories (family complaining):NO  Trouble using ordinary gadgets (microwave, computer, phone):NO  Forgets the month or year: NO  Mismanaging finances: NO  Remembering appts:NO  Daily  problems with thinking and/or memory:NO Ad8 score is=0     MMSE - Mini Mental State Exam 10/07/2016 10/03/2015  Orientation to time 5 5  Orientation to Place 5 5  Registration 3 3  Attention/ Calculation 5 5  Recall 3 3  Language- name 2 objects 2 2  Language- repeat 1 1  Language- follow 3 step command 3 3  Language- read & follow direction 1 1  Write a sentence 1 1  Copy design 1 1  Total score 30 30        Immunizations Immunization History  Administered Date(s) Administered  . PFIZER SARS-COV-2 Vaccination 10/01/2019, 10/24/2019  . Pneumococcal Conjugate-13 07/06/2016    TDAP status: Due, Education has been provided regarding the importance of this vaccine. Advised may receive this vaccine at local pharmacy or Health Dept. Aware to provide a copy of the vaccination record if obtained from local pharmacy or Health Dept. Verbalized acceptance and understanding. Covid-19 vaccine status: Completed vaccines  Qualifies for Shingles Vaccine? Yes   Zostavax completed No   Shingrix Completed?: No.    Education has been provided regarding the importance of this vaccine. Patient has been advised to call insurance company to determine out of pocket expense if they have not yet received this vaccine. Advised may also receive vaccine at local pharmacy or Health Dept. Verbalized acceptance and understanding.  Screening Tests Health Maintenance  Topic Date  Due  . Hepatitis C Screening  Never done  . TETANUS/TDAP  Never done  . PNA vac Low Risk Adult (2 of 2 - PPSV23) 07/06/2017  . INFLUENZA VACCINE  03/23/2020  . MAMMOGRAM  03/04/2021  . COLONOSCOPY  01/18/2023  . DEXA SCAN  Completed  . COVID-19 Vaccine  Completed    Health Maintenance  Health Maintenance Due  Topic Date Due  . Hepatitis C Screening  Never done  . TETANUS/TDAP  Never done  . PNA vac Low Risk Adult (2 of 2 - PPSV23) 07/06/2017    Colorectal cancer screening: Completed 01/17/18. Repeat every 5  years Mammogram status: Ordered today. Pt provided with contact info and advised to call to schedule appt.  Bone Density status: Completed 09/26/18. Results reflect: Bone density results: OSTEOPENIA. Repeat every 2 years.  Lung Cancer Screening: (Low Dose CT Chest recommended if Age 61-80 years, 30 pack-year currently smoking OR have quit w/in 15years.) does qualify.   Lung Cancer Screening Referral: pt declines today  Additional Screening:  Vision Screening: Recommended annual ophthalmology exams for early detection of glaucoma and other disorders of the eye. Is the patient up to date with their annual eye exam?  Yes  Who is the provider or what is the name of the office in which the patient attends annual eye exams? Dr. Dyane Dustman   Dental Screening: Recommended annual dental exams for proper oral hygiene  Community Resource Referral / Chronic Care Management: CRR required this visit?  No   CCM required this visit?  No      Plan:    Please schedule your next medicare wellness visit with me in 1 yr.  Continue to eat heart healthy diet (full of fruits, vegetables, whole grains, lean protein, water--limit salt, fat, and sugar intake) and increase physical activity as tolerated.  Continue doing brain stimulating activities (puzzles, reading, adult coloring books, staying active) to keep memory sharp.   I have ordered your mammogram.  I have personally reviewed and noted the following in the patient's chart:   . Medical and social history . Use of alcohol, tobacco or illicit drugs  . Current medications and supplements . Functional ability and status . Nutritional status . Physical activity . Advanced directives . List of other physicians . Hospitalizations, surgeries, and ER visits in previous 12 months . Vitals . Screenings to include cognitive, depression, and falls . Referrals and appointments  In addition, I have reviewed and discussed with patient certain preventive  protocols, quality metrics, and best practice recommendations. A written personalized care plan for preventive services as well as general preventive health recommendations were provided to patient.   Due to this being a telephonic visit, the after visit summary with patients personalized plan was offered to patient via mail or my-chart.Patient would like to access on my-chart   Shela Nevin, [place]   03/07/2020

## 2020-03-07 NOTE — Patient Instructions (Signed)
Please schedule your next medicare wellness visit with me in 1 yr.  Continue to eat heart healthy diet (full of fruits, vegetables, whole grains, lean protein, water--limit salt, fat, and sugar intake) and increase physical activity as tolerated.  Continue doing brain stimulating activities (puzzles, reading, adult coloring books, staying active) to keep memory sharp.   I have ordered your mammogram.   Renee [name] , Thank you for taking time to come for your Medicare Wellness Visit. I appreciate your ongoing commitment to your health goals. Please review the following plan we discussed and let me know if I can assist you in the future.   These are the goals we discussed: Goals    . Increase water intake    . Reduce alcohol intake     . Reduce sodium intake     DASH Eating Plan DASH stands for "Dietary Approaches to Stop Hypertension." The DASH eating plan is a healthy eating plan that has been shown to reduce high blood pressure (hypertension). Additional health benefits may include reducing the risk of type 2 diabetes mellitus, heart disease, and stroke. The DASH eating plan may also help with weight loss. What do I need to know about the DASH eating plan? For the DASH eating plan, you will follow these general guidelines:  Choose foods with less than 150 milligrams of sodium per serving (as listed on the food label).  Use salt-free seasonings or herbs instead of table salt or sea salt.  Check with your health care provider or pharmacist before using salt substitutes.  Eat lower-sodium products. These are often labeled as "low-sodium" or "no salt added."  Eat fresh foods. Avoid eating a lot of canned foods.  Eat more vegetables, fruits, and low-fat dairy products.  Choose whole grains. Look for the word "whole" as the first word in the ingredient list.  Choose fish and skinless chicken or [place] more often than red meat. Limit fish, poultry, and meat to 6 oz (170 g) each  day.  Limit sweets, desserts, sugars, and sugary drinks.  Choose heart-healthy fats.  Eat more home-cooked food and less restaurant, buffet, and fast food.  Limit fried foods.  Do not fry foods. Cook foods using methods such as baking, boiling, grilling, and broiling instead.  When eating at a restaurant, ask that your food be prepared with less salt, or no salt if possible. What foods can I eat? Seek help from a dietitian for individual calorie needs. Grains  Whole grain or whole wheat bread. Brown rice. Whole grain or whole wheat pasta. Quinoa, bulgur, and whole grain cereals. Low-sodium cereals. Corn or whole wheat flour tortillas. Whole grain cornbread. Whole grain crackers. Low-sodium crackers. Vegetables  Fresh or frozen vegetables (raw, steamed, roasted, or grilled). Low-sodium or reduced-sodium tomato and vegetable juices. Low-sodium or reduced-sodium tomato sauce and paste. Low-sodium or reduced-sodium canned vegetables. Fruits  All fresh, canned (in natural juice), or frozen fruits. Meat and Other Protein Products  Ground beef (85% or leaner), grass-fed beef, or beef trimmed of fat. Skinless chicken or [place]. Ground chicken or [place]. Pork trimmed of fat. All fish and seafood. Eggs. Dried beans, peas, or lentils. Unsalted nuts and seeds. Unsalted canned beans. Dairy  Low-fat dairy products, such as skim or 1% milk, 2% or reduced-fat cheeses, low-fat ricotta or cottage cheese, or plain low-fat yogurt. Low-sodium or reduced-sodium cheeses. Fats and Oils  Tub margarines without trans fats. Light or reduced-fat mayonnaise and salad dressings (reduced sodium). Avocado. Safflower, olive, or canola oils.  Natural peanut or almond butter. Other  Unsalted popcorn and pretzels. The items listed above may not be a complete list of recommended foods or beverages. Contact your dietitian for more options.  What foods are not recommended? Grains  White bread. White pasta. White rice.  Refined cornbread. Bagels and croissants. Crackers that contain trans fat. Vegetables  Creamed or fried vegetables. Vegetables in a cheese sauce. Regular canned vegetables. Regular canned tomato sauce and paste. Regular tomato and vegetable juices. Fruits  Canned fruit in light or heavy syrup. Fruit juice. Meat and Other Protein Products  Fatty cuts of meat. Ribs, chicken wings, bacon, sausage, bologna, salami, chitterlings, fatback, hot dogs, bratwurst, and packaged luncheon meats. Salted nuts and seeds. Canned beans with salt. Dairy  Whole or 2% milk, cream, half-and-half, and cream cheese. Whole-fat or sweetened yogurt. Full-fat cheeses or blue cheese. Nondairy creamers and whipped toppings. Processed cheese, cheese spreads, or cheese curds. Condiments  Onion and garlic salt, seasoned salt, table salt, and sea salt. Canned and packaged gravies. Worcestershire sauce. Tartar sauce. Barbecue sauce. Teriyaki sauce. Soy sauce, including reduced sodium. Steak sauce. Fish sauce. Oyster sauce. Cocktail sauce. Horseradish. Ketchup and mustard. Meat flavorings and tenderizers. Bouillon cubes. Hot sauce. Tabasco sauce. Marinades. Taco seasonings. Relishes. Fats and Oils  Butter, stick margarine, lard, shortening, ghee, and bacon fat. Coconut, palm kernel, or palm oils. Regular salad dressings. Other  Pickles and olives. Salted popcorn and pretzels. The items listed above may not be a complete list of foods and beverages to avoid. Contact your dietitian for more information.  Where can I find more information? National Heart, Lung, and Blood Institute: travelstabloid.com This information is not intended to replace advice given to you by your health care provider. Make sure you discuss any questions you have with your health care provider. Document Released: 07/29/2011 Document Revised: 01/15/2016 Document Reviewed: 06/13/2013 Elsevier Interactive Patient Education  2017  [employer].        This is a list of the screening recommended for you and due dates:  Health Maintenance  Topic Date Due  .  Hepatitis C: One time screening is recommended by Center for Disease Control  (CDC) for  adults born from [birth year] through 1965.   Never done  . Tetanus Vaccine  Never done  . Pneumonia vaccines (2 of 2 - PPSV23) 07/06/2017  . Flu Shot  03/23/2020  . Mammogram  03/04/2021  . Colon Cancer Screening  01/18/2023  . DEXA scan (bone density measurement)  Completed  . COVID-19 Vaccine  Completed    Preventive Care 74 Years and Older, Female Preventive care refers to lifestyle choices and visits with your health care provider that can promote health and wellness. This includes:  A yearly physical exam. This is also called an annual well check.  Regular dental and eye exams.  Immunizations.  Screening for certain conditions.  Healthy lifestyle choices, such as diet and exercise. What can I expect for my preventive care visit? Physical exam Your health care provider will check:  Height and weight. These may be used to calculate body mass index (BMI), which is a measurement that tells if you are at a healthy weight.  Heart rate and blood pressure.  Your skin for abnormal spots. Counseling Your health care provider may ask you questions about:  Alcohol, tobacco, and drug use.  Emotional well-being.  Home and relationship well-being.  Sexual activity.  Eating habits.  History of falls.  Memory and ability to understand (cognition).  Work  and work environment.  Pregnancy and menstrual history. What immunizations do I need?  Influenza (flu) vaccine  This is recommended every year. Tetanus, diphtheria, and pertussis (Tdap) vaccine  You may need a Td booster every 10 years. Varicella (chickenpox) vaccine  You may need this vaccine if you have not already been vaccinated. Zoster (shingles) vaccine  You may need this after age  3. Pneumococcal conjugate (PCV13) vaccine  One dose is recommended after age 39. Pneumococcal polysaccharide (PPSV23) vaccine  One dose is recommended after age 4. Measles, mumps, and rubella (MMR) vaccine  You may need at least one dose of MMR if you were born in 1957 or later. You may also need a second dose. Meningococcal conjugate (MenACWY) vaccine  You may need this if you have certain conditions. Hepatitis A vaccine  You may need this if you have certain conditions or if you travel or work in places where you may be exposed to hepatitis A. Hepatitis B vaccine  You may need this if you have certain conditions or if you travel or work in places where you may be exposed to hepatitis B. Haemophilus influenzae type b (Hib) vaccine  You may need this if you have certain conditions. You may receive vaccines as individual doses or as more than one vaccine together in one shot (combination vaccines). Talk with your health care provider about the risks and benefits of combination vaccines. What tests do I need? Blood tests  Lipid and cholesterol levels. These may be checked every 5 years, or more frequently depending on your overall health.  Hepatitis C test.  Hepatitis B test. Screening  Lung cancer screening. You may have this screening every year starting at age 23 if you have a 30-pack-year history of smoking and currently smoke or have quit within the past 15 years.  Colorectal cancer screening. All adults should have this screening starting at age 48 and continuing until age 62. Your health care provider may recommend screening at age 55 if you are at increased risk. You will have tests every 1-10 years, depending on your results and the type of screening test.  Diabetes screening. This is done by checking your blood sugar (glucose) after you have not eaten for a while (fasting). You may have this done every 1-3 years.  Mammogram. This may be done every 1-2 years. Talk with  your health care provider about how often you should have regular mammograms.  BRCA-related cancer screening. This may be done if you have a family history of breast, ovarian, tubal, or peritoneal cancers. Other tests  Sexually transmitted disease (STD) testing.  Bone density scan. This is done to screen for osteoporosis. You may have this done starting at age 75. Follow these instructions at home: Eating and drinking  Eat a diet that includes fresh fruits and vegetables, whole grains, lean protein, and low-fat dairy products. Limit your intake of foods with high amounts of sugar, saturated fats, and salt.  Take vitamin and mineral supplements as recommended by your health care provider.  Do not drink alcohol if your health care provider tells you not to drink.  If you drink alcohol: ? Limit how much you have to 0-1 drink a day. ? Be aware of how much alcohol is in your drink. In the [place] one drink equals one 12 oz bottle of beer (355 mL), one 5 oz glass of wine (148 mL), or one 1 oz glass of hard liquor (44 mL). Lifestyle  Take daily care of  your teeth and gums.  Stay active. Exercise for at least 30 minutes on 5 or more days each week.  Do not use any products that contain nicotine or tobacco, such as cigarettes, e-cigarettes, and chewing tobacco. If you need help quitting, ask your health care provider.  If you are sexually active, practice safe sex. Use a condom or other form of protection in order to prevent STIs (sexually transmitted infections).  Talk with your health care provider about taking a low-dose aspirin or statin. What's next?  Go to your health care provider once a year for a well check visit.  Ask your health care provider how often you should have your eyes and teeth checked.  Stay up to date on all vaccines. This information is not intended to replace advice given to you by your health care provider. Make sure you discuss any questions you have with your  health care provider. Document Revised: 08/03/2018 Document Reviewed: 08/03/2018 Elsevier Patient Education  2020 [employer].

## 2020-03-13 DIAGNOSIS — H1045 Other chronic allergic conjunctivitis: Secondary | ICD-10-CM | POA: Diagnosis not present

## 2020-03-13 DIAGNOSIS — H2513 Age-related nuclear cataract, bilateral: Secondary | ICD-10-CM | POA: Diagnosis not present

## 2020-04-08 ENCOUNTER — Other Ambulatory Visit: Payer: Self-pay

## 2020-04-08 ENCOUNTER — Encounter (HOSPITAL_BASED_OUTPATIENT_CLINIC_OR_DEPARTMENT_OTHER): Payer: Self-pay

## 2020-04-08 ENCOUNTER — Ambulatory Visit (HOSPITAL_BASED_OUTPATIENT_CLINIC_OR_DEPARTMENT_OTHER)
Admission: RE | Admit: 2020-04-08 | Discharge: 2020-04-08 | Disposition: A | Discharge status: Home / Self Care | Payer: Medicare Other | Source: Ambulatory Visit | Attending: Family Medicine | Admitting: Family Medicine

## 2020-04-08 DIAGNOSIS — Z1231 Encounter for screening mammogram for malignant neoplasm of breast: Secondary | ICD-10-CM | POA: Diagnosis not present

## 2020-04-24 ENCOUNTER — Encounter: Payer: Self-pay | Admitting: Family Medicine

## 2020-04-24 ENCOUNTER — Other Ambulatory Visit: Payer: Self-pay

## 2020-04-24 ENCOUNTER — Telehealth: Payer: Self-pay | Admitting: Family Medicine

## 2020-04-24 ENCOUNTER — Ambulatory Visit (INDEPENDENT_AMBULATORY_CARE_PROVIDER_SITE_OTHER): Payer: Medicare Other | Admitting: Family Medicine

## 2020-04-24 VITALS — BP 140/82 | HR 74 | Temp 98.0°F | Resp 17 | Ht 66.5 in | Wt 209.0 lb

## 2020-04-24 DIAGNOSIS — M25551 Pain in right hip: Secondary | ICD-10-CM

## 2020-04-24 DIAGNOSIS — R739 Hyperglycemia, unspecified: Secondary | ICD-10-CM

## 2020-04-24 DIAGNOSIS — I10 Essential (primary) hypertension: Secondary | ICD-10-CM

## 2020-04-24 DIAGNOSIS — E785 Hyperlipidemia, unspecified: Secondary | ICD-10-CM

## 2020-04-24 DIAGNOSIS — R1031 Right lower quadrant pain: Secondary | ICD-10-CM | POA: Diagnosis not present

## 2020-04-24 DIAGNOSIS — G8929 Other chronic pain: Secondary | ICD-10-CM

## 2020-04-24 DIAGNOSIS — R0789 Other chest pain: Secondary | ICD-10-CM

## 2020-04-24 DIAGNOSIS — Z0000 Encounter for general adult medical examination without abnormal findings: Secondary | ICD-10-CM

## 2020-04-24 DIAGNOSIS — Z23 Encounter for immunization: Secondary | ICD-10-CM

## 2020-04-24 DIAGNOSIS — R1013 Epigastric pain: Secondary | ICD-10-CM

## 2020-04-24 NOTE — Patient Instructions (Signed)
Preventive Care 51 Years and Older, Female Preventive care refers to lifestyle choices and visits with your health care provider that can promote health and wellness. This includes:  A yearly physical exam. This is also called an annual well check.  Regular dental and eye exams.  Immunizations.  Screening for certain conditions.  Healthy lifestyle choices, such as diet and exercise. What can I expect for my preventive care visit? Physical exam Your health care provider will check:  Height and weight. These may be used to calculate body mass index (BMI), which is a measurement that tells if you are at a healthy weight.  Heart rate and blood pressure.  Your skin for abnormal spots. Counseling Your health care provider may ask you questions about:  Alcohol, tobacco, and drug use.  Emotional well-being.  Home and relationship well-being.  Sexual activity.  Eating habits.  History of falls.  Memory and ability to understand (cognition).  Work and work Statistician.  Pregnancy and menstrual history. What immunizations do I need?  Influenza (flu) vaccine  This is recommended every year. Tetanus, diphtheria, and pertussis (Tdap) vaccine  You may need a Td booster every 10 years. Varicella (chickenpox) vaccine  You may need this vaccine if you have not already been vaccinated. Zoster (shingles) vaccine  You may need this after age 39. Pneumococcal conjugate (PCV13) vaccine  One dose is recommended after age 73. Pneumococcal polysaccharide (PPSV23) vaccine  One dose is recommended after age 13. Measles, mumps, and rubella (MMR) vaccine  You may need at least one dose of MMR if you were born in 1957 or later. You may also need a second dose. Meningococcal conjugate (MenACWY) vaccine  You may need this if you have certain conditions. Hepatitis A vaccine  You may need this if you have certain conditions or if you travel or work in places where you may be exposed  to hepatitis A. Hepatitis B vaccine  You may need this if you have certain conditions or if you travel or work in places where you may be exposed to hepatitis B. Haemophilus influenzae type b (Hib) vaccine  You may need this if you have certain conditions. You may receive vaccines as individual doses or as more than one vaccine together in one shot (combination vaccines). Talk with your health care provider about the risks and benefits of combination vaccines. What tests do I need? Blood tests  Lipid and cholesterol levels. These may be checked every 5 years, or more frequently depending on your overall health.  Hepatitis C test.  Hepatitis B test. Screening  Lung cancer screening. You may have this screening every year starting at age 69 if you have a 30-pack-year history of smoking and currently smoke or have quit within the past 15 years.  Colorectal cancer screening. All adults should have this screening starting at age 71 and continuing until age 4. Your health care provider may recommend screening at age 64 if you are at increased risk. You will have tests every 1-10 years, depending on your results and the type of screening test.  Diabetes screening. This is done by checking your blood sugar (glucose) after you have not eaten for a while (fasting). You may have this done every 1-3 years.  Mammogram. This may be done every 1-2 years. Talk with your health care provider about how often you should have regular mammograms.  BRCA-related cancer screening. This may be done if you have a family history of breast, ovarian, tubal, or peritoneal cancers.  Other tests °· Sexually transmitted disease (STD) testing. °· Bone density scan. This is done to screen for osteoporosis. You may have this done starting at age 65. °Follow these instructions at home: °Eating and drinking °· Eat a diet that includes fresh fruits and vegetables, whole grains, lean protein, and low-fat dairy products. Limit  your intake of foods with high amounts of sugar, saturated fats, and salt. °· Take vitamin and mineral supplements as recommended by your health care provider. °· Do not drink alcohol if your health care provider tells you not to drink. °· If you drink alcohol: °? Limit how much you have to 0-1 drink a day. °? Be aware of how much alcohol is in your drink. In the [place] one drink equals one 12 oz bottle of beer (355 mL), one 5 oz glass of wine (148 mL), or one 1½ oz glass of hard liquor (44 mL). °Lifestyle °· Take daily care of your teeth and gums. °· Stay active. Exercise for at least 30 minutes on 5 or more days each week. °· Do not use any products that contain nicotine or tobacco, such as cigarettes, e-cigarettes, and chewing tobacco. If you need help quitting, ask your health care provider. °· If you are sexually active, practice safe sex. Use a condom or other form of protection in order to prevent STIs (sexually transmitted infections). °· Talk with your health care provider about taking a low-dose aspirin or statin. °What's next? °· Go to your health care provider once a year for a well check visit. °· Ask your health care provider how often you should have your eyes and teeth checked. °· Stay up to date on all vaccines. °This information is not intended to replace advice given to you by your health care provider. Make sure you discuss any questions you have with your health care provider. °Document Revised: 08/03/2018 Document Reviewed: 08/03/2018 °Elsevier Patient Education © 2020 [employer] Inc[employer] ° °

## 2020-04-24 NOTE — Telephone Encounter (Signed)
Requesting a call back from [place].

## 2020-04-24 NOTE — Telephone Encounter (Signed)
Tried calling patient, no answer-unable to leave a message.

## 2020-04-24 NOTE — Telephone Encounter (Signed)
Sent detailed message to patient in regards to her question about the pneumonia vaccine she received today.

## 2020-04-25 LAB — COMPREHENSIVE METABOLIC PANEL
AG Ratio: 1.6 (calc) (ref 1.0–2.5)
ALT: 23 U/L (ref 6–29)
AST: 23 U/L (ref 10–35)
Albumin: 4.2 g/dL (ref 3.6–5.1)
Alkaline phosphatase (APISO): 79 U/L (ref 37–153)
BUN: 15 mg/dL (ref 7–25)
CO2: 25 mmol/L (ref 20–32)
Calcium: 9.3 mg/dL (ref 8.6–10.4)
Chloride: 104 mmol/L (ref 98–110)
Creat: 0.84 mg/dL (ref 0.60–0.93)
Globulin: 2.7 g/dL (calc) (ref 1.9–3.7)
Glucose, Bld: 112 mg/dL — ABNORMAL HIGH (ref 65–99)
Potassium: 4.9 mmol/L (ref 3.5–5.3)
Sodium: 138 mmol/L (ref 135–146)
Total Bilirubin: 0.8 mg/dL (ref 0.2–1.2)
Total Protein: 6.9 g/dL (ref 6.1–8.1)

## 2020-04-25 LAB — LIPID PANEL
Cholesterol: 231 mg/dL — ABNORMAL HIGH (ref <200)
HDL: 68 mg/dL (ref >=50)
LDL Cholesterol (Calc): 139 mg/dL (calc) — ABNORMAL HIGH
Non-HDL Cholesterol (Calc): 163 mg/dL (calc) — ABNORMAL HIGH (ref <130)
Total CHOL/HDL Ratio: 3.4 (calc) (ref <5.0)
Triglycerides: 118 mg/dL (ref <150)

## 2020-04-25 LAB — CBC
HCT: 46.6 % — ABNORMAL HIGH (ref 35.0–45.0)
Hemoglobin: 15.5 g/dL (ref 11.7–15.5)
MCH: 33.5 pg — ABNORMAL HIGH (ref 27.0–33.0)
MCHC: 33.3 g/dL (ref 32.0–36.0)
MCV: 100.6 fL — ABNORMAL HIGH (ref 80.0–100.0)
MPV: 10.6 fL (ref 7.5–12.5)
Platelets: 248 10*3/uL (ref 140–400)
RBC: 4.63 10*6/uL (ref 3.80–5.10)
RDW: 13.5 % (ref 11.0–15.0)
WBC: 6.5 10*3/uL (ref 3.8–10.8)

## 2020-04-25 LAB — HEMOGLOBIN A1C
Hgb A1c MFr Bld: 5.7 % of total Hgb — ABNORMAL HIGH (ref <5.7)
Mean Plasma Glucose: 117 (calc)
eAG (mmol/L): 6.5 (calc)

## 2020-04-25 LAB — TSH: TSH: 1.32 mIU/L (ref 0.40–4.50)

## 2020-04-28 DIAGNOSIS — M25551 Pain in right hip: Secondary | ICD-10-CM

## 2020-04-28 HISTORY — DX: Pain in right hip: M25.551

## 2020-04-28 NOTE — Assessment & Plan Note (Addendum)
Is noting worsening constipation, now only moving her bowels every couple of days which is a change. Notes pain mostly in RLQ pain and epigastrium at times.

## 2020-04-28 NOTE — Assessment & Plan Note (Signed)
hgba1c acceptable, minimize simple carbs. Increase exercise as tolerated.  

## 2020-04-28 NOTE — Assessment & Plan Note (Signed)
Encouraged moist heat and gentle stretching as tolerated. May try NSAIDs and prescription meds as directed and report if symptoms worsen or seek immediate care. Referred to Sports medicine for further consideration.

## 2020-04-28 NOTE — Assessment & Plan Note (Signed)
Well controlled, no changes to meds. Encouraged heart healthy diet such as the DASH diet and exercise as tolerated.  °

## 2020-04-28 NOTE — Progress Notes (Signed)
Subjective:    Patient ID: Renee [name], female    DOB: 1946-02-27, 74 y.o.   MRN: [id number]  Chief Complaint  Patient presents with  . Annual Exam    advise on baby aspirin daily?     HPI Patient is in today for annual preventative exam and follow up on chronic medical concerns. No recent febrile illness or acute hospitalizations. She is trying to stay active and maintain a heart healthy diet. She is maintaining quarantine. Is noting fleeting sharp chest pains intermittently over past month. No associated symptoms. Is also noting right hip pain without any injury or trauma. Finally she notes slowing of her bowels, now only moving her bowels every couple of days. No bloody or tarry stool. She notes intermittent RLQ pain. Denies palp/SOB/HA/congestion/fevers or GU c/o. Taking meds as prescribed  Past Medical History:  Diagnosis Date  . Abdominal pain 07/05/2017  . ABNORMAL INVOLUNTARY MOVEMENTS 01/01/2009   Qualifier: Diagnosis of  By: Linna Darner MD, Gwyndolyn Saxon    . Allergic rhinitis   . Allergic rhinitis 09/19/2008   Qualifier: Diagnosis of  By: Wynona Luna   . Allergy   . Cervical cancer screening 05/08/2013   Menarche 11, regular Menopause 41s G2P2 s/p 2 svd No abnormal paps or MGM Fatty cyst on labia removed.   . Cervical pain (neck) 10/07/2015  . CHEST PAIN 09/19/2008   Qualifier: Diagnosis of  By: Wynona Luna   . 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 Concussion 1969  . Diverticulosis 05/06/2017  . Essential hypertension 09/19/2008   Qualifier: Diagnosis of  By: Wynona Luna   . GERD (gastroesophageal reflux disease)   . H/O osteopenia 02/08/2016  . H/O tobacco use, presenting hazards to health 09/19/2008   Qualifier: Diagnosis of  By: Wynona Luna Smoking roughly 5 cig daily  Last cigarette was in December of 2016  . Head tremor 12/27/2016  . 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 Hx of adenomatous colonic polyps 01/27/2018  . Hyperglycemia 10/07/2016  . Hyperlipidemia   . Hyperlipidemia, mixed 10/03/2008   Qualifier: Diagnosis of  By: Wynona Luna   . Hypertension   . 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 Obesity 07/06/2016  . Occult blood positive stool   . Polyp of sigmoid colon 12/08/2016   11/2016 - Unable to remove or get bx at colonoscopy. Fixed sigmoid colon prevents. Referred for surgery.  Marland Kitchen PONV (postoperative nausea and vomiting)    ,   . Preventative health care 05/12/2013  . 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 Tobacco abuse   . Tremor, essential   . TREMOR, ESSENTIAL 09/19/2008   Qualifier: Diagnosis of  By: Wynona Luna   . Vertigo   . Vertigo     Past Surgical History:  Procedure Laterality Date  . INNER EAR SURGERY  2015   with tube  . MOUTH SURGERY  01/22/11   lower denture secured by titanium bolts  . rectal suction biospy    . XI ROBOTIC ASSISTED LOWER ANTERIOR RESECTION N/A 02/09/2017   Procedure: XI ROBOTIC RESECTION OF SIGMOID COLON, RIGID PROCTOSCOPY;  Surgeon: Michael Boston, MD;  Location: [facility];  Service: General;  Laterality: N/A;    Family History  Problem Relation Age of Onset  . Heart attack Father 74       deceased  . Stroke Mother   . Hypertension Mother   . Alcohol abuse Mother   . Obesity Daughter   . Heart disease Paternal Grandmother        AAA rupture  . Heart disease Paternal Grandfather        MI  . Colon cancer Neg Hx   . Breast cancer Neg Hx   . Prostate cancer Neg Hx   . Diabetes Neg Hx   . Liver cancer Neg Hx   . Rectal cancer Neg Hx   . Stomach cancer Neg Hx     Social History   Socioeconomic History  . Marital status: Married    Spouse name: Not on file  . Number of children: Not on file  . Years of education: Not on file  . Highest education level: Not on file  Occupational History  . Not on file  Tobacco  Use  . Smoking status: Former Smoker    Packs/day: 0.50    Types: Cigarettes    Quit date: 09/07/2015    Years since quitting: 4.6  . Smokeless tobacco: Never Used  Vaping Use  . Vaping Use: Never used  Substance and Sexual Activity  . Alcohol use: Yes    Alcohol/week: 14.0 standard drinks    Types: 14 Glasses of wine per week    Comment: 3 glasses of wine daily   . Drug use: No  . Sexual activity: Yes  Other Topics Concern  . Not on file  Social History Narrative  . Not on file   Social Determinants of Health   Financial Resource Strain: Low Risk   . Difficulty of Paying Living Expenses: Not hard at all  Food Insecurity: No Food Insecurity  . Worried About Charity fundraiser in the Last Year: Never true  . Ran Out of Food in the Last Year: Never true  Transportation Needs: No Transportation Needs  . Lack of Transportation (Medical): No  . Lack of Transportation (Non-Medical): No  Physical Activity:   . Days of Exercise per Week: Not on file  . Minutes of Exercise per Session: Not on file  Stress:   . Feeling of Stress : Not on file  Social Connections:   . Frequency of Communication with Friends and Family: Not on file  . Frequency of Social Gatherings with Friends and Family: Not on file  . Attends Religious Services: Not on file  . Active Member of Clubs or Organizations: Not on file  . Attends Archivist Meetings: Not on file  . Marital Status: Not on file  Intimate Partner Violence:   . Fear of Current or Ex-Partner: Not on file  . Emotionally Abused: Not on file  . Physically Abused: Not on file  . Sexually Abused: Not on file    Outpatient Medications Prior to Visit  Medication Sig Dispense Refill  . atorvastatin (LIPITOR) 10 MG tablet TAKE 1/2 TABLET (5 MG TOTAL) BY MOUTH EVERY OTHER DAY. 30 tablet 2  . b complex vitamins tablet Take 1 tablet by mouth every other day.    . Coenzyme Q10 200 MG capsule Take 200 mg by mouth every other day. 3 times  per week    . famotidine (PEPCID) 20 MG tablet Take 1 tablet (20 mg total) by mouth daily as needed for heartburn or indigestion. 90 tablet 1  . Lactobacillus (PROBIOTIC ACIDOPHILUS) CAPS  Take by mouth.    . metoprolol succinate (TOPROL-XL) 25 MG 24 hr tablet TAKE 1 TABLET BY MOUTH EVERY DAY 90 tablet 1  . Multiple Vitamin (MULTIVITAMIN WITH MINERALS) TABS tablet Take 1 tablet by mouth every other day.    . Wheat Dextrin (BENEFIBER PO) Take by mouth.     No facility-administered medications prior to visit.    Allergies  Allergen Reactions  . Amoxicillin     REACTION: TROUBLE BREATHING Has patient had a PCN reaction causing immediate rash, facial/tongue/throat swelling, SOB or lightheadedness with hypotension:Yes Has patient had a PCN reaction causing severe rash involving mucus membranes or skin necrosis:No Has patient had a PCN reaction that required hospitalization:No Has patient had a PCN reaction occurring within the last 10 years:No If all of the above answers are "NO", then may proceed with Cephalosporin use.   Abe People Hfa [Albuterol] Other (See Comments)    Hypersensitivity, anxious, disoriented  . Penicillins Rash    Has patient had a PCN reaction causing immediate rash, facial/tongue/throat swelling, SOB or lightheadedness with hypotension:unknown Has patient had a PCN reaction causing severe rash involving mucus membranes or skin necrosis:unknown Has patient had a PCN reaction that required hospitalization:no Has patient had a PCN reaction occurring within the last 10 years:no If all of the above answers are "NO", then may proceed with Cephalosporin use.     Review of Systems  Constitutional: Negative for fever and malaise/fatigue.  HENT: Negative for congestion.   Eyes: Negative for blurred vision.  Respiratory: Negative for shortness of breath.   Cardiovascular: Positive for chest pain. Negative for palpitations and leg swelling.  Gastrointestinal: Negative for  abdominal pain, blood in stool and nausea.  Genitourinary: Negative for dysuria and frequency.  Musculoskeletal: Positive for joint pain. Negative for falls.  Skin: Negative for rash.  Neurological: Positive for tremors. Negative for dizziness, loss of consciousness and headaches.  Endo/Heme/Allergies: Negative for environmental allergies.  Psychiatric/Behavioral: Negative for depression. The patient is nervous/anxious.        Objective:    Physical Exam  BP 140/82 (BP Location: Left Arm, Patient Position: Sitting, Cuff Size: Normal)   Pulse 74   Temp 98 F (36.7 C) (Oral)   Resp 17   Ht 5' 6.5" (1.689 m)   Wt 209 lb (94.8 kg)   SpO2 98%   BMI 33.23 kg/m  Wt Readings from Last 3 Encounters:  04/24/20 209 lb (94.8 kg)  01/30/20 212 lb 6 oz (96.3 kg)  10/22/19 213 lb (96.6 kg)    Diabetic Foot Exam - Simple   No data filed     Lab Results  Component Value Date   WBC 6.5 04/24/2020   HGB 15.5 04/24/2020   HCT 46.6 (H) 04/24/2020   PLT 248 04/24/2020   GLUCOSE 112 (H) 04/24/2020   CHOL 231 (H) 04/24/2020   TRIG 118 04/24/2020   HDL 68 04/24/2020   LDLDIRECT 148.6 09/26/2008   LDLCALC 139 (H) 04/24/2020   ALT 23 04/24/2020   AST 23 04/24/2020   NA 138 04/24/2020   K 4.9 04/24/2020   CL 104 04/24/2020   CREATININE 0.84 04/24/2020   BUN 15 04/24/2020   CO2 25 04/24/2020   TSH 1.32 04/24/2020   HGBA1C 5.7 (H) 04/24/2020    Lab Results  Component Value Date   TSH 1.32 04/24/2020   Lab Results  Component Value Date   WBC 6.5 04/24/2020   HGB 15.5 04/24/2020   HCT 46.6 (H) 04/24/2020  MCV 100.6 (H) 04/24/2020   PLT 248 04/24/2020   Lab Results  Component Value Date   NA 138 04/24/2020   K 4.9 04/24/2020   CO2 25 04/24/2020   GLUCOSE 112 (H) 04/24/2020   BUN 15 04/24/2020   CREATININE 0.84 04/24/2020   BILITOT 0.8 04/24/2020   ALKPHOS 92 10/22/2019   AST 23 04/24/2020   ALT 23 04/24/2020   PROT 6.9 04/24/2020   ALBUMIN 4.1 10/22/2019   CALCIUM  9.3 04/24/2020   ANIONGAP 6 02/10/2017   GFR 79.24 10/22/2019   Lab Results  Component Value Date   CHOL 231 (H) 04/24/2020   Lab Results  Component Value Date   HDL 68 04/24/2020   Lab Results  Component Value Date   LDLCALC 139 (H) 04/24/2020   Lab Results  Component Value Date   TRIG 118 04/24/2020   Lab Results  Component Value Date   CHOLHDL 3.4 04/24/2020   Lab Results  Component Value Date   HGBA1C 5.7 (H) 04/24/2020       Assessment & Plan:   Problem List Items Addressed This Visit    Hyperlipidemia    Encouraged heart healthy diet, increase exercise, avoid trans fats, consider a krill oil cap daily. Tolerating Atorvastatin.       Relevant Orders   Lipid panel (Completed)   Essential hypertension    Well controlled, no changes to meds. Encouraged heart healthy diet such as the DASH diet and exercise as tolerated.       Relevant Orders   CBC (Completed)   Comprehensive metabolic panel (Completed)   TSH (Completed)   Atypical chest pain    Infrequent but recurrent sharp chest pain without associated symptoms. She is referred to cardiology for consideration due to her risk factors and concerns. EKG today was unremarkable      Relevant Orders   Ambulatory referral to Cardiology   EKG 12-Lead (Completed)   Preventative health care - Primary    Patient encouraged to maintain heart healthy diet, regular exercise, adequate sleep. Consider daily probiotics. Take medications as prescribed. Labs ordered and reviewed. Pneumovax given today. MGM was August 2021 repeat in one year. Dexa scan February 2020 repeat in 2-5 years. Sees gastroenterology next week.       Hyperglycemia    hgba1c acceptable, minimize simple carbs. Increase exercise as tolerated.       Relevant Orders   Hemoglobin A1c (Completed)   Abdominal pain    Is noting worsening constipation, now only moving her bowels every couple of days which is a change. Notes pain mostly in RLQ pain and  epigastrium at times.      Right hip pain    Encouraged moist heat and gentle stretching as tolerated. May try NSAIDs and prescription meds as directed and report if symptoms worsen or seek immediate care. Referred to Sports medicine for further consideration.       Relevant Orders   Ambulatory referral to Sports Medicine    Other Visit Diagnoses    Chronic RLQ pain       Relevant Orders   DG Abd 2 Views   Immunization due       Relevant Orders   Pneumococcal polysaccharide vaccine 23-valent greater than or equal to 2yo subcutaneous/IM (Completed)      I am having Lakyra L. Chenier maintain her b complex vitamins, multivitamin with minerals, Coenzyme Q10, famotidine, atorvastatin, Probiotic Acidophilus, Wheat Dextrin (BENEFIBER PO), and metoprolol succinate.  No orders of the defined  types were placed in this encounter.    Penni Homans, MD

## 2020-04-28 NOTE — Assessment & Plan Note (Signed)
Encouraged heart healthy diet, increase exercise, avoid trans fats, consider a krill oil cap daily. Tolerating Atorvastatin 

## 2020-04-28 NOTE — Assessment & Plan Note (Addendum)
Patient encouraged to maintain heart healthy diet, regular exercise, adequate sleep. Consider daily probiotics. Take medications as prescribed. Labs ordered and reviewed. Pneumovax given today. MGM was August 2021 repeat in one year. Dexa scan February 2020 repeat in 2-5 years. Sees gastroenterology next week.

## 2020-04-28 NOTE — Assessment & Plan Note (Signed)
Infrequent but recurrent sharp chest pain without associated symptoms. She is referred to cardiology for consideration due to her risk factors and concerns. EKG today was unremarkable

## 2020-04-30 ENCOUNTER — Ambulatory Visit: Payer: Medicare Other | Admitting: Nurse Practitioner

## 2020-04-30 ENCOUNTER — Encounter: Payer: Self-pay | Admitting: Nurse Practitioner

## 2020-04-30 VITALS — BP 150/84 | HR 84 | Ht 66.5 in | Wt 211.0 lb

## 2020-04-30 DIAGNOSIS — L852 Keratosis punctata (palmaris et plantaris): Secondary | ICD-10-CM | POA: Diagnosis not present

## 2020-04-30 DIAGNOSIS — L814 Other melanin hyperpigmentation: Secondary | ICD-10-CM | POA: Diagnosis not present

## 2020-04-30 DIAGNOSIS — L820 Inflamed seborrheic keratosis: Secondary | ICD-10-CM | POA: Diagnosis not present

## 2020-04-30 DIAGNOSIS — K228 Other specified diseases of esophagus: Secondary | ICD-10-CM

## 2020-04-30 DIAGNOSIS — X32XXXS Exposure to sunlight, sequela: Secondary | ICD-10-CM | POA: Diagnosis not present

## 2020-04-30 DIAGNOSIS — K219 Gastro-esophageal reflux disease without esophagitis: Secondary | ICD-10-CM | POA: Diagnosis not present

## 2020-04-30 DIAGNOSIS — K2289 Other specified disease of esophagus: Secondary | ICD-10-CM

## 2020-04-30 DIAGNOSIS — R1310 Dysphagia, unspecified: Secondary | ICD-10-CM

## 2020-04-30 DIAGNOSIS — D485 Neoplasm of uncertain behavior of skin: Secondary | ICD-10-CM | POA: Diagnosis not present

## 2020-04-30 DIAGNOSIS — L570 Actinic keratosis: Secondary | ICD-10-CM | POA: Diagnosis not present

## 2020-04-30 MED ORDER — FAMOTIDINE 20 MG PO TABS: 20.0000 mg | ORAL_TABLET | Freq: Two times a day (BID) | ORAL | 1 refills | Status: DC

## 2020-04-30 NOTE — Progress Notes (Signed)
04/30/2020 SHARONNA VINJE [id number] [birth year]   Chief Complaint: Abdominal pain  History of Present Illness:  Renee [name] is a 74 year old female with a past medical history of hypertension, essential head tremor and hepatic steatosis.  S/P sigmoid resection secondary to a stricture 01/2017 and colon polyps.  She is followed by Dr. [name]. She presents today for further evaluation for central abdominal weight gain with a moderate amount of fat tissue to her abdomen which she assesses is causing pressure in her chest/esophagus. She has gained 45 lbs over the past 3 years which occurred after she stopped smoking cigarettes. She is exercising less due to the gyn restrictions during the Covid pandemic. She describes having pressure in her mid chest/esphagus intermittently for the past year. She sometimes feels this discomfort during the day. No SOB. She feels if food sits in the mid esophagus if she eats too much which occurs once or twice weekly and lasts for one minute or less. Vitamins sometime gets stuck in upper esophagus. She drinks water and the stuck vitamin passes down. She is taking Famotidine at bed time. She complains of abdominal bloat. She is having irregular Bms. Several BMs in one day then no BM for a day. Stools are solid then soft to loose. No watery diarrhea. No blood with BMs. No rectal bleeding. Her most recent colonoscopy was 01/17/2018, a total of 5 polyps were removed from the cecum, ascending colon, transverse, descending and sigmoid colon. She is scheduled to see a cardiologist in Nov. 2021 as she was concerned her chest pressure might be heart related. Her father died at the age of 19 secondary to a MI.   CBC Latest Ref Rng & Units 04/24/2020 10/22/2019 04/23/2019  WBC 3.8 - 10.8 Thousand/uL 6.5 7.4 7.0  Hemoglobin 11.7 - 15.5 g/dL 15.5 14.8 14.9  Hematocrit 35 - 45 % 46.6(H) 44.0 44.3  Platelets 140 - 400 Thousand/uL 248 255.0 231.0   CMP Latest Ref Rng & Units 04/24/2020  10/22/2019 04/23/2019  Glucose 65 - 99 mg/dL 112(H) 91 87  BUN 7 - 25 mg/dL 15 13 13   Creatinine 0.60 - 0.93 mg/dL 0.84 0.72 0.66  Sodium 135 - 146 mmol/L 138 139 139  Potassium 3.5 - 5.3 mmol/L 4.9 4.3 4.2  Chloride 98 - 110 mmol/L 104 101 103  CO2 20 - 32 mmol/L 25 27 26   Calcium 8.6 - 10.4 mg/dL 9.3 9.5 9.1  Total Protein 6.1 - 8.1 g/dL 6.9 7.1 6.8  Total Bilirubin 0.2 - 1.2 mg/dL 0.8 0.7 0.9  Alkaline Phos 39 - 117 U/L - 92 91  AST 10 - 35 U/L 23 19 19   ALT 6 - 29 U/L 23 23 23   TSH 1.32   RUQ sonogram 05/02/2019: 1. No acute findings or explanation for the patient's symptoms. 2. Mildly increased hepatic echogenicity, most commonly indicating hepatic steatosis. 3. Probable Aortic Atherosclerosis   Colonoscopy 01/17/2018: - Four diminutive polyps in the sigmoid colon, in the descending colon, in the transverse colon and in the ascending colon, removed with a cold snare. Resected and retrieved. - One 1 to 2 mm polyp in the cecum, removed with a cold biopsy forceps. Resected and retrieved. - Patent end-to-end colo-colonic anastomosis, characterized by healthy appearing mucosa. - The examination was otherwise normal on direct and retroflexion views. - 5 year recall Surgical [P], cecal, ascending, transverse, descending, sigmoid, polyp (5) - TUBULAR ADENOMA (3 OF 5 FRAGMENTS) - BENIGN COLONIC MUCOSA (2 OF 5  FRAGMENTS) - NO HIGH GRADE DYSPLASIA OR MALIGNANCY IDENTIFIED   Current Outpatient Medications on File Prior to Visit  Medication Sig Dispense Refill  . atorvastatin (LIPITOR) 10 MG tablet TAKE 1/2 TABLET (5 MG TOTAL) BY MOUTH EVERY OTHER DAY. 30 tablet 2  . b complex vitamins tablet Take 1 tablet by mouth every other day.    . Coenzyme Q10 200 MG capsule Take 200 mg by mouth every other day. 3 times per week    . metoprolol succinate (TOPROL-XL) 25 MG 24 hr tablet TAKE 1 TABLET BY MOUTH EVERY DAY 90 tablet 1  . Multiple Vitamin (MULTIVITAMIN WITH MINERALS) TABS tablet Take 1  tablet by mouth every other day.    . Wheat Dextrin (BENEFIBER PO) Take by mouth.     No current facility-administered medications on file prior to visit.    Allergies  Allergen Reactions  . Amoxicillin     REACTION: TROUBLE BREATHING Has patient had a PCN reaction causing immediate rash, facial/tongue/throat swelling, SOB or lightheadedness with hypotension:Yes Has patient had a PCN reaction causing severe rash involving mucus membranes or skin necrosis:No Has patient had a PCN reaction that required hospitalization:No Has patient had a PCN reaction occurring within the last 10 years:No If all of the above answers are "NO", then may proceed with Cephalosporin use.   Abe People Hfa [Albuterol] Other (See Comments)    Hypersensitivity, anxious, disoriented  . Penicillins Rash    Has patient had a PCN reaction causing immediate rash, facial/tongue/throat swelling, SOB or lightheadedness with hypotension:unknown Has patient had a PCN reaction causing severe rash involving mucus membranes or skin necrosis:unknown Has patient had a PCN reaction that required hospitalization:no Has patient had a PCN reaction occurring within the last 10 years:no If all of the above answers are "NO", then may proceed with Cephalosporin use.     Current Medications, Allergies, Past Medical History, Past Surgical History, Family History and Social History were reviewed in [employer] record.   Physical Exam: BP (!) 150/84   Pulse 84   Ht 5' 6.5" (1.689 m)   Wt 211 lb (95.7 kg)   BMI 33.55 kg/m    Wt Readings from Last 3 Encounters:  04/30/20 211 lb (95.7 kg)  04/24/20 209 lb (94.8 kg)  01/30/20 212 lb 6 oz (96.3 kg)   General: 74 year old female female in no acute distress. Head: Normocephalic and atraumatic. Eyes: No scleral icterus. Conjunctiva pink . Ears: Normal auditory acuity. Mouth: Dentition intact. No ulcers or lesions.  Lungs: Clear throughout to  auscultation. Heart: Regular rate and rhythm, no murmur. Abdomen: Soft, nontender and nondistended. Moderate amount of central abdominal adipose without discrete mass. No hepatomegaly. Normal bowel sounds x 4 quadrants.  Rectal: Deferred.  Musculoskeletal: Symmetrical with no gross deformities. Extremities: No edema. Neurological: Alert oriented x 4. Active head tremor.   Psychological: Alert and cooperative. Normal mood and affect  Assessment and Recommendations:  43. 74 year old female with esophageal vs chest pain, esophageal dysmotility and dysphagia  -Barium swallow with tablet -EGD to be scheduled after cardiac evaluation completed -Recommended earlier cardiac consult appointment if chest pressure persists, to the ED if she develops worsening chest pressure -Weight loss recommended -Increase Famotidine 20mg  bid   2. Central abdominal adipose with progressive weight gain -Recommended weight loss, resume exercise as tolerated  3. History of colon polyps -Next colonoscopy due 12/2022  4. Hepatic steatosis. Normal LFTs.  -Weight loss  -Eventual referral to [facility]

## 2020-04-30 NOTE — Patient Instructions (Signed)
If you are age 74 or older, your body mass index should be between 23-30. Your Body mass index is 33.55 kg/m. If this is out of the aforementioned range listed, please consider follow up with your Primary Care Provider.  If you are age 58 or younger, your body mass index should be between 19-25. Your Body mass index is 33.55 kg/m. If this is out of the aformentioned range listed, please consider follow up with your Primary Care Provider.   We have sent the following medications to your pharmacy for you to pick up at your convenience: Famotadine 20 mg 1 tablet twice a day.  Schedule an appointment with your cardiologist, after this appointment is complete we will get cardiac clearance and schedule and EGD  Weight loss is recommended   You have been scheduled for a Barium Esophogram at [facility] (1st floor of the hospital) on      At     . Please arrive 15 minutes prior to your appointment for registration. Make certain not to have anything to eat or drink 3 hours prior to your test. If you need to reschedule for any reason, please contact radiology at [phone] to do so. __________________________________________________________________ A barium swallow is an examination that concentrates on views of the esophagus. This tends to be a double contrast exam (barium and two liquids which, when combined, create a gas to distend the wall of the oesophagus) or single contrast (non-ionic iodine based). The study is usually tailored to your symptoms so a good history is essential. Attention is paid during the study to the form, structure and configuration of the esophagus, looking for functional disorders (such as aspiration, dysphagia, achalasia, motility and reflux) EXAMINATION You may be asked to change into a gown, depending on the type of swallow being performed. A radiologist and radiographer will perform the procedure. The radiologist will advise you of the type of contrast selected  for your procedure and direct you during the exam. You will be asked to stand, sit or lie in several different positions and to hold a small amount of fluid in your mouth before being asked to swallow while the imaging is performed .In some instances you may be asked to swallow barium coated marshmallows to assess the motility of a solid food bolus. The exam can be recorded as a digital or video fluoroscopy procedure. POST PROCEDURE It will take 1-2 days for the barium to pass through your system. To facilitate this, it is important, unless otherwise directed, to increase your fluids for the next 24-48hrs and to resume your normal diet.  This test typically takes about 30 minutes to perform. __________________________________________________________________________________   Due to recent changes in healthcare laws, you may see the results of your imaging and laboratory studies on MyChart before your provider has had a chance to review them.  We understand that in some cases there may be results that are confusing or concerning to you. Not all laboratory results come back in the same time frame and the provider may be waiting for multiple results in order to interpret others.  Please give [place] 48 hours in order for your provider to thoroughly review all the results before contacting the office for clarification of your results.   Thank you for choosing [facility] Noralyn Pick, CRNP

## 2020-05-01 ENCOUNTER — Ambulatory Visit (HOSPITAL_BASED_OUTPATIENT_CLINIC_OR_DEPARTMENT_OTHER)
Admission: RE | Admit: 2020-05-01 | Discharge: 2020-05-01 | Disposition: A | Discharge status: Home / Self Care | Payer: Medicare Other | Source: Ambulatory Visit | Attending: Family Medicine | Admitting: Family Medicine

## 2020-05-01 ENCOUNTER — Other Ambulatory Visit: Payer: Self-pay

## 2020-05-01 ENCOUNTER — Ambulatory Visit: Payer: Medicare Other | Admitting: Internal Medicine

## 2020-05-01 DIAGNOSIS — N200 Calculus of kidney: Secondary | ICD-10-CM | POA: Diagnosis not present

## 2020-05-01 DIAGNOSIS — R1031 Right lower quadrant pain: Secondary | ICD-10-CM | POA: Diagnosis not present

## 2020-05-01 DIAGNOSIS — G8929 Other chronic pain: Secondary | ICD-10-CM | POA: Diagnosis not present

## 2020-05-05 ENCOUNTER — Encounter: Payer: Self-pay | Admitting: Family Medicine

## 2020-05-06 ENCOUNTER — Encounter: Payer: Self-pay | Admitting: Family Medicine

## 2020-05-06 ENCOUNTER — Other Ambulatory Visit: Payer: Self-pay

## 2020-05-06 ENCOUNTER — Ambulatory Visit: Payer: Medicare Other | Admitting: Family Medicine

## 2020-05-06 ENCOUNTER — Ambulatory Visit: Payer: Self-pay

## 2020-05-06 VITALS — BP 149/106 | HR 78 | Ht 66.0 in | Wt 205.0 lb

## 2020-05-06 DIAGNOSIS — M25531 Pain in right wrist: Secondary | ICD-10-CM | POA: Insufficient documentation

## 2020-05-06 DIAGNOSIS — S76011A Strain of muscle, fascia and tendon of right hip, initial encounter: Secondary | ICD-10-CM

## 2020-05-06 DIAGNOSIS — M79644 Pain in right finger(s): Secondary | ICD-10-CM

## 2020-05-06 HISTORY — DX: Pain in right wrist: M25.531

## 2020-05-06 HISTORY — DX: Strain of muscle, fascia and tendon of right hip, initial encounter: S76.011A

## 2020-05-06 MED ORDER — PREDNISONE 5 MG PO TABS: ORAL_TABLET | ORAL | 0 refills | Status: DC

## 2020-05-06 NOTE — Assessment & Plan Note (Signed)
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 -Counseled supportive care. -Brace. -Prednisone. -Consider injection.

## 2020-05-06 NOTE — Patient Instructions (Signed)
Good to see you Please try the exercises  Please continue heat  Please try ice on the wrist  Please try the wrist brace  Please send me a message in MyChart with any questions or updates.  Please see me back in 4 weeks.   --Dr. Raeford Razor

## 2020-05-06 NOTE — Progress Notes (Signed)
Renee [name] - 74 y.o. female MRN [id number]  Date of birth: [birth year]  SUBJECTIVE:  Including CC & ROS.  Chief Complaint  Patient presents with  . Back Pain    right buttocks    Renee [name] is a 74 y.o. female that is presenting with posterior right hip pain as well as right thumb and wrist pain.  The hip pain is been ongoing for about 6 months.  Denies any specific inciting event.  It is posterior nature.  No history of surgery.  The right-handed wrist pain is intermittent in nature.  It is worse with certain movements and using it.  No history of surgery..  Review of the bone density scan from 2020 shows a T score of -1.9.   Review of Systems See HPI   HISTORY: Past Medical, Surgical, Social, and Family History Reviewed & Updated per EMR.   Pertinent Historical Findings include:  Past Medical History:  Diagnosis Date  . Abdominal pain 07/05/2017  . ABNORMAL INVOLUNTARY MOVEMENTS 01/01/2009   Qualifier: Diagnosis of  By: Linna Darner MD, Gwyndolyn Saxon    . Allergic rhinitis   . Allergic rhinitis 09/19/2008   Qualifier: Diagnosis of  By: Wynona Luna   . Allergy   . Cervical cancer screening 05/08/2013   Menarche 11, regular Menopause 65s G2P2 s/p 2 svd No abnormal paps or [employer] Fatty cyst on labia removed.   . Cervical pain (neck) 10/07/2015  . CHEST PAIN 09/19/2008   Qualifier: Diagnosis of  By: Wynona Luna   . 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 Concussion 1969  . Diverticulosis 05/06/2017  . Essential hypertension 09/19/2008   Qualifier: Diagnosis of  By: Wynona Luna   . GERD (gastroesophageal reflux disease)   . H/O osteopenia 02/08/2016  . H/O tobacco use, presenting hazards to health 09/19/2008   Qualifier: Diagnosis of  By: Wynona Luna Smoking roughly 5 cig daily  Last cigarette was in December of 2016  . Head tremor 12/27/2016  . 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 Hx of adenomatous colonic polyps 01/27/2018  . Hyperglycemia 10/07/2016  . Hyperlipidemia   . Hyperlipidemia, mixed 10/03/2008   Qualifier: Diagnosis of  By: Wynona Luna   . Hypertension   . 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 Obesity 07/06/2016  . Occult blood positive stool   . Polyp of sigmoid colon 12/08/2016   11/2016 - Unable to remove or get bx at colonoscopy. Fixed sigmoid colon prevents. Referred for surgery.  Marland Kitchen PONV (postoperative nausea and vomiting)    ,   . Preventative health care 05/12/2013  . 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 Tobacco abuse   . Tremor, essential   . TREMOR, ESSENTIAL 09/19/2008   Qualifier: Diagnosis of  By: Wynona Luna   . Vertigo   . Vertigo     Past Surgical History:  Procedure Laterality Date  . INNER EAR SURGERY  2015   with tube  . MOUTH SURGERY  01/22/11   lower denture secured by titanium bolts  . rectal suction biospy    . XI ROBOTIC ASSISTED LOWER ANTERIOR RESECTION N/A 02/09/2017   Procedure: XI ROBOTIC RESECTION OF SIGMOID COLON, RIGID PROCTOSCOPY;  Surgeon: Michael Boston, MD;  Location: [facility];  Service: General;  Laterality: N/A;    Family History  Problem Relation Age of Onset  . Heart attack Father 73       deceased  . Stroke Mother   . Hypertension Mother   . Alcohol abuse Mother   . Obesity Daughter   . Heart disease Paternal Grandmother        AAA rupture  . Heart disease Paternal Grandfather        MI  . Colon cancer Neg Hx   . Breast cancer Neg Hx   . Prostate cancer Neg Hx   . Diabetes Neg Hx   . Liver cancer Neg Hx   . Rectal cancer Neg Hx   . Stomach cancer Neg Hx     Social History   Socioeconomic History  . Marital status: Married    Spouse name: Not on file  . Number of children: 2  . Years of education: Not on file  . Highest education level: Not on file  Occupational History  . Occupation: retired  Tobacco Use  .  Smoking status: Former Smoker    Packs/day: 0.50    Types: Cigarettes    Quit date: 09/07/2015    Years since quitting: 4.6  . Smokeless tobacco: Never Used  Vaping Use  . Vaping Use: Never used  Substance and Sexual Activity  . Alcohol use: Yes    Alcohol/week: 14.0 standard drinks    Types: 14 Glasses of wine per week    Comment: 3 glasses of wine daily   . Drug use: No  . Sexual activity: Yes  Other Topics Concern  . Not on file  Social History Narrative  . Not on file   Social Determinants of Health   Financial Resource Strain: Low Risk   . Difficulty of Paying Living Expenses: Not hard at all  Food Insecurity: No Food Insecurity  . Worried About Charity fundraiser in the Last Year: Never true  . Ran Out of Food in the Last Year: Never true  Transportation Needs: No Transportation Needs  . Lack of Transportation (Medical): No  . Lack of Transportation (Non-Medical): No  Physical Activity:   . Days of Exercise per Week: Not on file  . Minutes of Exercise per Session: Not on file  Stress:   . Feeling of Stress : Not on file  Social Connections:   . Frequency of Communication with Friends and Family: Not on file  . Frequency of Social Gatherings with Friends and Family: Not on file  . Attends Religious Services: Not on file  . Active Member of Clubs or Organizations: Not on file  . Attends Archivist Meetings: Not on file  . Marital Status: Not on file  Intimate Partner Violence:   . Fear of Current or Ex-Partner: Not on file  . Emotionally Abused: Not on file  . Physically Abused: Not on file  . Sexually Abused: Not on file     PHYSICAL EXAM:  VS: BP (!) 149/106   Pulse 78   Ht 5\' 6"  (1.676 m)   Wt 205 lb (93 kg)   BMI 33.09 kg/m  Physical Exam Gen: NAD, alert, cooperative with exam, well-appearing MSK:  Right hip: Limited rotation on FABER  Normal FADIR  Weakness with hip abduction. Right wrist: Bossing of the carpal joints. Normal range  of motion. Normal pincer grasp. Neurovascularly intact  Limited ultrasound: Right wrist:  Minor degenerative changes of the CMC joint. 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 Effusion extending from the carpal joints. No change of the distal radius  Summary: Degenerative changes appreciated in the wrist.  Ultrasound and interpretation by Clearance Coots, MD    ASSESSMENT & PLAN:   Strain of gluteus medius of right lower extremity Seems more associated with her instability with weakness.  Possibly SI joint related. -Counseled on home exercise therapy and supportive care. -Could consider imaging or physical therapy.  Arthralgia of right wrist Has different areas within the carpal joints of severe degenerative changes and effusion. -Counseled supportive care. -Brace. -Prednisone. -Consider injection.

## 2020-05-06 NOTE — Assessment & Plan Note (Signed)
Seems more associated with her instability with weakness.  Possibly SI joint related. -Counseled on home exercise therapy and supportive care. -Could consider imaging or physical therapy.

## 2020-05-12 ENCOUNTER — Other Ambulatory Visit: Payer: Self-pay

## 2020-05-12 ENCOUNTER — Ambulatory Visit (HOSPITAL_COMMUNITY)
Admission: RE | Admit: 2020-05-12 | Discharge: 2020-05-12 | Disposition: A | Discharge status: Home / Self Care | Payer: Medicare Other | Source: Ambulatory Visit | Attending: Nurse Practitioner | Admitting: Nurse Practitioner

## 2020-05-12 DIAGNOSIS — K219 Gastro-esophageal reflux disease without esophagitis: Secondary | ICD-10-CM | POA: Diagnosis not present

## 2020-05-12 DIAGNOSIS — R1310 Dysphagia, unspecified: Secondary | ICD-10-CM

## 2020-05-12 DIAGNOSIS — K228 Other specified diseases of esophagus: Secondary | ICD-10-CM | POA: Insufficient documentation

## 2020-05-12 DIAGNOSIS — K2289 Other specified disease of esophagus: Secondary | ICD-10-CM

## 2020-05-23 ENCOUNTER — Other Ambulatory Visit: Payer: Self-pay | Admitting: Nurse Practitioner

## 2020-05-23 DIAGNOSIS — R1310 Dysphagia, unspecified: Secondary | ICD-10-CM

## 2020-05-23 DIAGNOSIS — K2289 Other specified disease of esophagus: Secondary | ICD-10-CM

## 2020-05-23 DIAGNOSIS — K219 Gastro-esophageal reflux disease without esophagitis: Secondary | ICD-10-CM

## 2020-06-06 ENCOUNTER — Other Ambulatory Visit: Payer: Self-pay | Admitting: Nurse Practitioner

## 2020-06-06 DIAGNOSIS — R1310 Dysphagia, unspecified: Secondary | ICD-10-CM

## 2020-06-06 DIAGNOSIS — K2289 Other specified disease of esophagus: Secondary | ICD-10-CM

## 2020-06-06 DIAGNOSIS — K219 Gastro-esophageal reflux disease without esophagitis: Secondary | ICD-10-CM

## 2020-06-30 DIAGNOSIS — T7840XA Allergy, unspecified, initial encounter: Secondary | ICD-10-CM | POA: Insufficient documentation

## 2020-06-30 DIAGNOSIS — R42 Dizziness and giddiness: Secondary | ICD-10-CM | POA: Insufficient documentation

## 2020-06-30 DIAGNOSIS — Z9889 Other specified postprocedural states: Secondary | ICD-10-CM | POA: Insufficient documentation

## 2020-06-30 DIAGNOSIS — G250 Essential tremor: Secondary | ICD-10-CM | POA: Insufficient documentation

## 2020-06-30 DIAGNOSIS — R112 Nausea with vomiting, unspecified: Secondary | ICD-10-CM | POA: Insufficient documentation

## 2020-06-30 DIAGNOSIS — I10 Essential (primary) hypertension: Secondary | ICD-10-CM | POA: Insufficient documentation

## 2020-06-30 DIAGNOSIS — R195 Other fecal abnormalities: Secondary | ICD-10-CM | POA: Insufficient documentation

## 2020-06-30 DIAGNOSIS — G709 Myoneural disorder, unspecified: Secondary | ICD-10-CM | POA: Insufficient documentation

## 2020-06-30 DIAGNOSIS — Z720 Tobacco use: Secondary | ICD-10-CM | POA: Insufficient documentation

## 2020-07-01 ENCOUNTER — Ambulatory Visit: Payer: Medicare Other | Admitting: Family Medicine

## 2020-07-01 NOTE — Progress Notes (Signed)
Cardiology Office Note:    Date:  07/02/2020   ID:  Renee [name], DOB 07-17-1946, MRN [id number]  PCP:  Renee Lukes, MD  Cardiologist:  Renee More, MD    Referring MD: Renee Lukes, MD    ASSESSMENT:    1. Chest pain of uncertain etiology   2. Primary hypertension   3. Hyperlipidemia, mixed    PLAN:    In order of problems listed above:  1. Stable presently not having chest pain she has had an ischemia evaluation that was normal at this time I do not think she requires CTA or repeat stress imaging.  I would advise proceeding with endoscopy 2. Stable BP at target continue beta-blocker 3. Stable continue with statin   Next appointment: As needed   Medication Adjustments/Labs and Tests Ordered: Current medicines are reviewed at length with the patient today.  Concerns regarding medicines are outlined above.  Orders Placed This Encounter  Procedures  . EKG 12-Lead   No orders of the defined types were placed in this encounter.   Chief Complaint  Patient presents with  . Chest Pain    History of Present Illness:    Renee [name] is a 74 y.o. female with a hx of hypertension hyperlipidemia and chest pain with a normal stress echocardiogram.  She was last seen 06/25/2017.  Compliance with diet, lifestyle and medications: Yes  She has been seen by GI their impression was esophageal versus cardiac source of chest pain advised endoscopy recommended cardiac evaluation prior to the procedure.  EKG 05/01/2020 showed sinus rhythm minor nonspecific ST abnormality otherwise normal She had an upper GI series performed 05/12/2020 that showed no findings of esophageal stricture or mass or change of motility mucosa.  She had a small hiatal hernia with mild GERD.  She almost canceled this visit.  She is not having chest pain no anginal symptoms.  I spoke to her about the difficulty discerning esophageal from cardiac chest pain she is at low risk with a normal stress echo  and not having angina gave her the options of further evaluation on one hand cardiac CTA and I think it is indicated on the other hand a coronary calcium score if high risk would need ischemia evaluation or simply doing an office EKG and if it is reassuring I do not think we need to do further testing I did review her EKG from 2 months ago but I think we need to repeat it today and she agrees.  She prefers no [name] x-rays worried about her cumulative dose.  She has mild exertional shortness of breath she attributes to weight gain that does not interfere with her life no edema chest pain palpitation or syncope. Past Medical History:  Diagnosis Date  . Abdominal pain 07/05/2017  . ABNORMAL INVOLUNTARY MOVEMENTS 01/01/2009   Qualifier: Diagnosis of  By: Linna Darner MD, Gwyndolyn Saxon    . Allergic rhinitis   . Allergic rhinitis 09/19/2008   Qualifier: Diagnosis of  By: Wynona Luna   . Allergy   . Arthralgia of right wrist 05/06/2020  . Atypical chest pain 09/19/2008   Qualifier: Diagnosis of  By: Wynona Luna   . Cervical cancer screening 05/08/2013   Menarche 11, regular Menopause 63s G2P2 s/p 2 svd No abnormal paps or [employer] Fatty cyst on labia removed.   . Cervical pain (neck) 10/07/2015  . CHEST PAIN 09/19/2008   Qualifier: Diagnosis of  By: Wynona Luna   .  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 Concussion 1969  . Diverticulosis 05/06/2017  . Essential hypertension 09/19/2008   Qualifier: Diagnosis of  By: Wynona Luna   . GERD (gastroesophageal reflux disease)   . H/O osteopenia 02/08/2016  . H/O tobacco use, presenting hazards to health 09/19/2008   Qualifier: Diagnosis of  By: Wynona Luna Smoking roughly 5 cig daily  Last cigarette was in December of 2016  . Head tremor 12/27/2016  . 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 Hx of adenomatous colonic polyps 01/27/2018  . Hyperglycemia  10/07/2016  . Hyperlipidemia   . Hyperlipidemia, mixed 10/03/2008   Qualifier: Diagnosis of  By: Wynona Luna   . Hypertension   . 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 Obesity 07/06/2016  . Occult blood positive stool   . Polyp of sigmoid colon 12/08/2016   11/2016 - Unable to remove or get bx at colonoscopy. Fixed sigmoid colon prevents. Referred for surgery.  Renee [name] PONV (postoperative nausea and vomiting)    ,   . PONV (postoperative nausea and vomiting)   . 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 Preventative health care 05/12/2013  . PVC's (premature ventricular contractions) 05/10/2017  . Rib pain on left side 11/17/2017  . Right chronic serous otitis media 05/21/2019  . Right hip pain 04/28/2020  . Shoulder pain 06/29/2018  . Strain of gluteus medius of right lower extremity 05/06/2020  . 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 Tobacco abuse   . Tremor, essential   . TREMOR, ESSENTIAL 09/19/2008   Qualifier: Diagnosis of  By: Wynona Luna   . Vertigo   . Vertigo     Past Surgical History:  Procedure Laterality Date  . INNER EAR SURGERY  2015   with tube  . MOUTH SURGERY  01/22/11   lower denture secured by titanium bolts  . rectal suction biospy    . XI ROBOTIC ASSISTED LOWER ANTERIOR RESECTION N/A 02/09/2017   Procedure: XI ROBOTIC RESECTION OF SIGMOID COLON, RIGID PROCTOSCOPY;  Surgeon: Michael Boston, MD;  Location: [facility];  Service: General;  Laterality: N/A;    Current Medications: Current Meds  Medication Sig  . atorvastatin (LIPITOR) 10 MG tablet TAKE 1/2 TABLET (5 MG TOTAL) BY MOUTH EVERY OTHER DAY.  Renee [name] b complex vitamins tablet Take 1 tablet by mouth every other day.  . famotidine (PEPCID) 20 MG tablet TAKE 1 TABLET BY MOUTH TWICE A DAY (Patient taking differently: Take 20 mg by mouth daily as needed. )  . metoprolol succinate (TOPROL-XL) 25 MG 24 hr tablet TAKE 1 TABLET BY MOUTH EVERY  DAY (Patient taking differently: Take 12.5 mg by mouth daily. )  . Multiple Vitamin (MULTIVITAMIN WITH MINERALS) TABS tablet Take 1 tablet by mouth every other day.  . Wheat Dextrin (BENEFIBER PO) Take by mouth.     Allergies:   Amoxicillin, Proair hfa [albuterol], and Penicillins   Social History   Socioeconomic History  . Marital status: Married    Spouse name: Not on file  . Number of children: 2  . Years of education: Not on file  . Highest education level: Not on file  Occupational History  . Occupation: retired  Tobacco Use  . Smoking status: Former  Smoker    Packs/day: 0.50    Types: Cigarettes    Quit date: 09/07/2015    Years since quitting: 4.8  . Smokeless tobacco: Never Used  Vaping Use  . Vaping Use: Never used  Substance and Sexual Activity  . Alcohol use: Yes    Alcohol/week: 14.0 standard drinks    Types: 14 Glasses of wine per week    Comment: 3 glasses of wine daily   . Drug use: No  . Sexual activity: Yes  Other Topics Concern  . Not on file  Social History Narrative  . Not on file   Social Determinants of Health   Financial Resource Strain: Low Risk   . Difficulty of Paying Living Expenses: Not hard at all  Food Insecurity: No Food Insecurity  . Worried About Charity fundraiser in the Last Year: Never true  . Ran Out of Food in the Last Year: Never true  Transportation Needs: No Transportation Needs  . Lack of Transportation (Medical): No  . Lack of Transportation (Non-Medical): No  Physical Activity:   . Days of Exercise per Week: Not on file  . Minutes of Exercise per Session: Not on file  Stress:   . Feeling of Stress : Not on file  Social Connections:   . Frequency of Communication with Friends and Family: Not on file  . Frequency of Social Gatherings with Friends and Family: Not on file  . Attends Religious Services: Not on file  . Active Member of Clubs or Organizations: Not on file  . Attends Archivist Meetings: Not on  file  . Marital Status: Not on file     Family History: The patient's family history includes Alcohol abuse in her mother; Heart attack (age of onset: 35) in her father; Heart disease in her paternal grandfather and paternal grandmother; Hypertension in her mother; Obesity in her daughter; Stroke in her mother. There is no history of Colon cancer, Breast cancer, Prostate cancer, Diabetes, Liver cancer, Rectal cancer, or Stomach cancer. ROS:   Please see the history of present illness.    All other systems reviewed and are negative.  EKGs/Labs/Other Studies Reviewed:    The following studies were reviewed today:  EKG:  EKG ordered today and personally reviewed.  The ekg ordered today demonstrates sinus rhythm minor nonspecific ST abnormality unchanged  Recent Labs: 04/24/2020: ALT 23; BUN 15; Creat 0.84; Hemoglobin 15.5; Platelets 248; Potassium 4.9; Sodium 138; TSH 1.32  Recent Lipid Panel    Component Value Date/Time   CHOL 231 (H) 04/24/2020 0912   TRIG 118 04/24/2020 0912   HDL 68 04/24/2020 0912   CHOLHDL 3.4 04/24/2020 0912   VLDL 25.4 10/22/2019 1438   LDLCALC 139 (H) 04/24/2020 0912   LDLDIRECT 148.6 09/26/2008 0811    Physical Exam:    VS:  BP 134/72 (BP Location: Right Arm, Patient Position: Sitting, Cuff Size: Normal)   Pulse 88   Ht 5\' 6"  (1.676 m)   Wt 212 lb 6.4 oz (96.3 kg)   SpO2 97%   BMI 34.28 kg/m     Wt Readings from Last 3 Encounters:  07/02/20 212 lb 6.4 oz (96.3 kg)  05/06/20 205 lb (93 kg)  04/30/20 211 lb (95.7 kg)     GEN:  Well nourished, well developed in no acute distress HEENT: Normal NECK: No JVD; No carotid bruits LYMPHATICS: No lymphadenopathy CARDIAC: RRR, no murmurs, rubs, gallops RESPIRATORY:  Clear to auscultation without rales, wheezing or rhonchi  ABDOMEN: Soft, non-tender, non-distended MUSCULOSKELETAL:  No edema; No deformity  SKIN: Warm and dry NEUROLOGIC:  Alert and oriented x 3 PSYCHIATRIC:  Normal affect     Signed, Renee More, MD  07/02/2020 9:14 AM    [place]

## 2020-07-02 ENCOUNTER — Encounter: Payer: Self-pay | Admitting: Cardiology

## 2020-07-02 ENCOUNTER — Ambulatory Visit: Payer: Medicare Other | Admitting: Cardiology

## 2020-07-02 ENCOUNTER — Other Ambulatory Visit: Payer: Self-pay

## 2020-07-02 VITALS — BP 134/72 | HR 88 | Ht 66.0 in | Wt 212.4 lb

## 2020-07-02 DIAGNOSIS — R079 Chest pain, unspecified: Secondary | ICD-10-CM

## 2020-07-02 DIAGNOSIS — I10 Essential (primary) hypertension: Secondary | ICD-10-CM | POA: Diagnosis not present

## 2020-07-02 DIAGNOSIS — E782 Mixed hyperlipidemia: Secondary | ICD-10-CM | POA: Diagnosis not present

## 2020-07-02 NOTE — Patient Instructions (Signed)

## 2020-10-13 ENCOUNTER — Other Ambulatory Visit: Payer: Self-pay | Admitting: Family Medicine

## 2020-10-23 ENCOUNTER — Other Ambulatory Visit: Payer: Self-pay

## 2020-10-23 ENCOUNTER — Ambulatory Visit (INDEPENDENT_AMBULATORY_CARE_PROVIDER_SITE_OTHER): Payer: Medicare Other | Admitting: Family Medicine

## 2020-10-23 ENCOUNTER — Encounter: Payer: Self-pay | Admitting: Family Medicine

## 2020-10-23 VITALS — BP 124/70 | HR 94 | Temp 98.6°F | Resp 18 | Wt 213.0 lb

## 2020-10-23 DIAGNOSIS — E785 Hyperlipidemia, unspecified: Secondary | ICD-10-CM

## 2020-10-23 DIAGNOSIS — K219 Gastro-esophageal reflux disease without esophagitis: Secondary | ICD-10-CM | POA: Diagnosis not present

## 2020-10-23 DIAGNOSIS — M79671 Pain in right foot: Secondary | ICD-10-CM

## 2020-10-23 DIAGNOSIS — Z87891 Personal history of nicotine dependence: Secondary | ICD-10-CM | POA: Diagnosis not present

## 2020-10-23 DIAGNOSIS — B070 Plantar wart: Secondary | ICD-10-CM | POA: Diagnosis not present

## 2020-10-23 DIAGNOSIS — Z720 Tobacco use: Secondary | ICD-10-CM

## 2020-10-23 DIAGNOSIS — R42 Dizziness and giddiness: Secondary | ICD-10-CM

## 2020-10-23 DIAGNOSIS — I10 Essential (primary) hypertension: Secondary | ICD-10-CM

## 2020-10-23 DIAGNOSIS — R739 Hyperglycemia, unspecified: Secondary | ICD-10-CM

## 2020-10-23 DIAGNOSIS — M79672 Pain in left foot: Secondary | ICD-10-CM | POA: Diagnosis not present

## 2020-10-23 DIAGNOSIS — E6609 Other obesity due to excess calories: Secondary | ICD-10-CM

## 2020-10-23 LAB — CBC
HCT: 45.3 % (ref 36.0–46.0)
Hemoglobin: 15.4 g/dL — ABNORMAL HIGH (ref 12.0–15.0)
MCHC: 34.1 g/dL (ref 30.0–36.0)
MCV: 100 fl (ref 78.0–100.0)
Platelets: 274 10*3/uL (ref 150.0–400.0)
RBC: 4.52 Mil/uL (ref 3.87–5.11)
RDW: 14.2 % (ref 11.5–15.5)
WBC: 6.3 10*3/uL (ref 4.0–10.5)

## 2020-10-23 LAB — COMPREHENSIVE METABOLIC PANEL
ALT: 27 U/L (ref 0–35)
AST: 20 U/L (ref 0–37)
Albumin: 4.3 g/dL (ref 3.5–5.2)
Alkaline Phosphatase: 96 U/L (ref 39–117)
BUN: 17 mg/dL (ref 6–23)
CO2: 26 mEq/L (ref 19–32)
Calcium: 9.4 mg/dL (ref 8.4–10.5)
Chloride: 103 mEq/L (ref 96–112)
Creatinine, Ser: 0.78 mg/dL (ref 0.40–1.20)
GFR: 74.66 mL/min (ref >60.00)
Glucose, Bld: 101 mg/dL — ABNORMAL HIGH (ref 70–99)
Potassium: 4.7 mEq/L (ref 3.5–5.1)
Sodium: 136 mEq/L (ref 135–145)
Total Bilirubin: 0.8 mg/dL (ref 0.2–1.2)
Total Protein: 7.2 g/dL (ref 6.0–8.3)

## 2020-10-23 LAB — LIPID PANEL
Cholesterol: 238 mg/dL — ABNORMAL HIGH (ref 0–200)
HDL: 69.1 mg/dL (ref >39.00)
LDL Cholesterol: 146 mg/dL — ABNORMAL HIGH (ref 0–99)
NonHDL: 168.51
Total CHOL/HDL Ratio: 3
Triglycerides: 112 mg/dL (ref 0.0–149.0)
VLDL: 22.4 mg/dL (ref 0.0–40.0)

## 2020-10-23 LAB — TSH: TSH: 1.32 u[IU]/mL (ref 0.35–4.50)

## 2020-10-23 LAB — HEMOGLOBIN A1C: Hgb A1c MFr Bld: 5.9 % (ref 4.6–6.5)

## 2020-10-23 NOTE — Patient Instructions (Addendum)
Shingrix is the new shingles shot, 2 shots over 2-6 months, at the pharmacy  MIND diet [email or web address]">  DASH Eating Plan DASH stands for Dietary Approaches to Stop Hypertension. The DASH eating plan is a healthy eating plan that has been shown to:  Reduce high blood pressure (hypertension).  Reduce your risk for type 2 diabetes, heart disease, and stroke.  Help with weight loss. What are tips for following this plan? Reading food labels  Check food labels for the amount of salt (sodium) per serving. Choose foods with less than 5 percent of the Daily Value of sodium. Generally, foods with less than 300 milligrams (mg) of sodium per serving fit into this eating plan.  To find whole grains, look for the word "whole" as the first word in the ingredient list. Shopping  Buy products labeled as "low-sodium" or "no salt added."  Buy fresh foods. Avoid canned foods and pre-made or frozen meals. Cooking  Avoid adding salt when cooking. Use salt-free seasonings or herbs instead of table salt or sea salt. Check with your health care provider or pharmacist before using salt substitutes.  Do not fry foods. Cook foods using healthy methods such as baking, boiling, grilling, roasting, and broiling instead.  Cook with heart-healthy oils, such as olive, canola, avocado, soybean, or sunflower oil. Meal planning  Eat a balanced diet that includes: ? 4 or more servings of fruits and 4 or more servings of vegetables each day. Try to fill one-half of your plate with fruits and vegetables. ? 6-8 servings of whole grains each day. ? Less than 6 oz (170 g) of lean meat, poultry, or fish each day. A 3-oz (85-g) serving of meat is about the same size as a deck of cards. One egg equals 1 oz (28 g). ? 2-3 servings of low-fat dairy each day. One serving is 1 cup (237 mL). ? 1 serving of nuts, seeds, or beans 5 times each week. ? 2-3 servings of heart-healthy  fats. Healthy fats called omega-3 fatty acids are found in foods such as walnuts, flaxseeds, fortified milks, and eggs. These fats are also found in cold-water fish, such as sardines, salmon, and mackerel.  Limit how much you eat of: ? Canned or prepackaged foods. ? Food that is high in trans fat, such as some fried foods. ? Food that is high in saturated fat, such as fatty meat. ? Desserts and other sweets, sugary drinks, and other foods with added sugar. ? Full-fat dairy products.  Do not salt foods before eating.  Do not eat more than 4 egg yolks a week.  Try to eat at least 2 vegetarian meals a week.  Eat more home-cooked food and less restaurant, buffet, and fast food.   Lifestyle  When eating at a restaurant, ask that your food be prepared with less salt or no salt, if possible.  If you drink alcohol: ? Limit how much you use to:  0-1 drink a day for women who are not pregnant.  0-2 drinks a day for men. ? Be aware of how much alcohol is in your drink. In the [place] one drink equals one 12 oz bottle of beer (355 mL), one 5 oz glass of wine (148 mL), or one 1 oz glass of hard liquor (44 mL). General information  Avoid eating more than 2,300 mg of salt a day. If you have hypertension, you may need to reduce your sodium intake to 1,500 mg a day.  Work with your health care provider  to maintain a healthy body weight or to lose weight. Ask what an ideal weight is for you.  Get at least 30 minutes of exercise that causes your heart to beat faster (aerobic exercise) most days of the week. Activities may include walking, swimming, or biking.  Work with your health care provider or dietitian to adjust your eating plan to your individual calorie needs. What foods should I eat? Fruits All fresh, dried, or frozen fruit. Canned fruit in natural juice (without added sugar). Vegetables Fresh or frozen vegetables (raw, steamed, roasted, or grilled). Low-sodium or reduced-sodium tomato  and vegetable juice. Low-sodium or reduced-sodium tomato sauce and tomato paste. Low-sodium or reduced-sodium canned vegetables. Grains Whole-grain or whole-wheat bread. Whole-grain or whole-wheat pasta. Brown rice. [place]. Bulgur. Whole-grain and low-sodium cereals. Pita bread. Low-fat, low-sodium crackers. Whole-wheat flour tortillas. Meats and other proteins Skinless chicken or [place]. Ground chicken or [place]. Pork with fat trimmed off. Fish and seafood. Egg whites. Dried beans, peas, or lentils. Unsalted nuts, nut butters, and seeds. Unsalted canned beans. Lean cuts of beef with fat trimmed off. Low-sodium, lean precooked or cured meat, such as sausages or meat loaves. Dairy Low-fat (1%) or fat-free (skim) milk. Reduced-fat, low-fat, or fat-free cheeses. Nonfat, low-sodium ricotta or cottage cheese. Low-fat or nonfat yogurt. Low-fat, low-sodium cheese. Fats and oils Soft margarine without trans fats. Vegetable oil. Reduced-fat, low-fat, or light mayonnaise and salad dressings (reduced-sodium). Canola, safflower, olive, avocado, soybean, and sunflower oils. Avocado. Seasonings and condiments Herbs. Spices. Seasoning mixes without salt. Other foods Unsalted popcorn and pretzels. Fat-free sweets. The items listed above may not be a complete list of foods and beverages you can eat. Contact a dietitian for more information. What foods should I avoid? Fruits Canned fruit in a light or heavy syrup. Fried fruit. Fruit in cream or butter sauce. Vegetables Creamed or fried vegetables. Vegetables in a cheese sauce. Regular canned vegetables (not low-sodium or reduced-sodium). Regular canned tomato sauce and paste (not low-sodium or reduced-sodium). Regular tomato and vegetable juice (not low-sodium or reduced-sodium). Angie Fava. Olives. Grains Baked goods made with fat, such as croissants, muffins, or some breads. Dry pasta or rice meal packs. Meats and other proteins Fatty cuts of meat. Ribs.  Fried meat. Berniece Salines. Bologna, salami, and other precooked or cured meats, such as sausages or meat loaves. Fat from the back of a pig (fatback). Bratwurst. Salted nuts and seeds. Canned beans with added salt. Canned or smoked fish. Whole eggs or egg yolks. Chicken or [place] with skin. Dairy Whole or 2% milk, cream, and half-and-half. Whole or full-fat cream cheese. Whole-fat or sweetened yogurt. Full-fat cheese. Nondairy creamers. Whipped toppings. Processed cheese and cheese spreads. Fats and oils Butter. Stick margarine. Lard. Shortening. Ghee. Bacon fat. Tropical oils, such as coconut, palm kernel, or palm oil. Seasonings and condiments Onion salt, garlic salt, seasoned salt, table salt, and sea salt. Worcestershire sauce. Tartar sauce. Barbecue sauce. Teriyaki sauce. Soy sauce, including reduced-sodium. Steak sauce. Canned and packaged gravies. Fish sauce. Oyster sauce. Cocktail sauce. Store-bought horseradish. Ketchup. Mustard. Meat flavorings and tenderizers. Bouillon cubes. Hot sauces. Pre-made or packaged marinades. Pre-made or packaged taco seasonings. Relishes. Regular salad dressings. Other foods Salted popcorn and pretzels. The items listed above may not be a complete list of foods and beverages you should avoid. Contact a dietitian for more information. Where to find more information  National Heart, Lung, and Blood Institute: https://wilson-eaton.com/  American Heart Association: www.heart.org  Academy of Nutrition and Dietetics: www.eatright.[place]:  www.kidney.org Summary  The DASH eating plan is a healthy eating plan that has been shown to reduce high blood pressure (hypertension). It may also reduce your risk for type 2 diabetes, heart disease, and stroke.  When on the DASH eating plan, aim to eat more fresh fruits and vegetables, whole grains, lean proteins, low-fat dairy, and heart-healthy fats.  With the DASH eating plan, you should limit salt (sodium)  intake to 2,300 mg a day. If you have hypertension, you may need to reduce your sodium intake to 1,500 mg a day.  Work with your health care provider or dietitian to adjust your eating plan to your individual calorie needs. This information is not intended to replace advice given to you by your health care provider. Make sure you discuss any questions you have with your health care provider. Document Revised: 07/13/2019 Document Reviewed: 07/13/2019 Elsevier Patient Education  2021 [employer].

## 2020-10-23 NOTE — Assessment & Plan Note (Signed)
Well controlled, no changes to meds. Encouraged heart healthy diet such as the DASH diet and exercise as tolerated.  °

## 2020-10-23 NOTE — Assessment & Plan Note (Signed)
Encouraged heart healthy diet, increase exercise, avoid trans fats, consider a krill oil cap daily. Tolerating Atorvastatin 

## 2020-10-26 DIAGNOSIS — B070 Plantar wart: Secondary | ICD-10-CM | POA: Insufficient documentation

## 2020-10-26 HISTORY — DX: Plantar wart: B07.0

## 2020-10-26 NOTE — Assessment & Plan Note (Signed)
hgba1c acceptable, minimize simple carbs. Increase exercise as tolerated.  

## 2020-10-26 NOTE — Assessment & Plan Note (Signed)
Well controlled, no changes to meds. Encouraged heart healthy diet such as the DASH diet and exercise as tolerated.  °

## 2020-10-26 NOTE — Assessment & Plan Note (Signed)
Avoid offending foods, start probiotics. Do not eat large meals in late evening and consider raising head of bed.  

## 2020-10-26 NOTE — Assessment & Plan Note (Signed)
Continues to abstain 

## 2020-10-26 NOTE — Progress Notes (Signed)
Subjective:    Patient ID: Renee [name], female    DOB: [birth year], 75 y.o.   MRN: [id number]  Chief Complaint  Patient presents with  . Follow-up  . Hyperlipidemia  . Hypertension    HPI Patient is in today for follow up on chronic medical concerns. No recent febrile illness or hospitalization. She is noting ongoing trouble with her right ear but no severe pain or discharge. She had surgery 7 years ago for a cholesteatoma which did not really help and she has had more trouble since. She has since had a tympanostomy tube placed and that has not helped. Denies CP/palp/SOB/HA/congestion/fevers/GI or GU c/o. Taking meds as prescribed  Past Medical History:  Diagnosis Date  . Abdominal pain 07/05/2017  . ABNORMAL INVOLUNTARY MOVEMENTS 01/01/2009   Qualifier: Diagnosis of  By: Linna Darner MD, Gwyndolyn Saxon    . Allergic rhinitis   . Allergic rhinitis 09/19/2008   Qualifier: Diagnosis of  By: Wynona Luna   . Allergy   . Arthralgia of right wrist 05/06/2020  . Atypical chest pain 09/19/2008   Qualifier: Diagnosis of  By: Wynona Luna   . Cervical cancer screening 05/08/2013   Menarche 11, regular Menopause 4s G2P2 s/p 2 svd No abnormal paps or MGM Fatty cyst on labia removed.   . Cervical pain (neck) 10/07/2015  . CHEST PAIN 09/19/2008   Qualifier: Diagnosis of  By: Wynona Luna   . 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 Concussion 1969  . Diverticulosis 05/06/2017  . Essential hypertension 09/19/2008   Qualifier: Diagnosis of  By: Wynona Luna   . GERD (gastroesophageal reflux disease)   . H/O osteopenia 02/08/2016  . H/O tobacco use, presenting hazards to health 09/19/2008   Qualifier: Diagnosis of  By: Wynona Luna Smoking roughly 5 cig daily  Last cigarette was in December of 2016  . Head tremor 12/27/2016  . 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 Hx of adenomatous  colonic polyps 01/27/2018  . Hyperglycemia 10/07/2016  . Hyperlipidemia   . Hyperlipidemia, mixed 10/03/2008   Qualifier: Diagnosis of  By: Wynona Luna   . Hypertension   . 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 Obesity 07/06/2016  . Occult blood positive stool   . Polyp of sigmoid colon 12/08/2016   11/2016 - Unable to remove or get bx at colonoscopy. Fixed sigmoid colon prevents. Referred for surgery.  Marland Kitchen PONV (postoperative nausea and vomiting)    ,   . PONV (postoperative nausea and vomiting)   . 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 Preventative health care 05/12/2013  . PVC's (premature ventricular contractions) 05/10/2017  . Rib pain on left side 11/17/2017  . Right chronic serous otitis media 05/21/2019  . Right hip pain 04/28/2020  . Shoulder pain 06/29/2018  . Strain of gluteus medius of right lower extremity 05/06/2020  . 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 Tobacco abuse   . Tremor, essential   . TREMOR, ESSENTIAL 09/19/2008   Qualifier: Diagnosis of  By: Wynona Luna   . Vertigo   . Vertigo     Past Surgical History:  Procedure Laterality Date  . INNER EAR SURGERY  2015   with tube  .  MOUTH SURGERY  01/22/11   lower denture secured by titanium bolts  . rectal suction biospy    . XI ROBOTIC ASSISTED LOWER ANTERIOR RESECTION N/A 02/09/2017   Procedure: XI ROBOTIC RESECTION OF SIGMOID COLON, RIGID PROCTOSCOPY;  Surgeon: Michael Boston, MD;  Location: [facility];  Service: General;  Laterality: N/A;    Family History  Problem Relation Age of Onset  . Heart attack Father 77       deceased  . Stroke Mother   . Hypertension Mother   . Alcohol abuse Mother   . Obesity Daughter   . Heart disease Paternal Grandmother        AAA rupture  . Heart disease Paternal Grandfather        MI  . Colon cancer Neg Hx   . Breast cancer Neg Hx   . Prostate cancer Neg Hx   . Diabetes Neg Hx   . Liver  cancer Neg Hx   . Rectal cancer Neg Hx   . Stomach cancer Neg Hx     Social History   Socioeconomic History  . Marital status: Married    Spouse name: Not on file  . Number of children: 2  . Years of education: Not on file  . Highest education level: Not on file  Occupational History  . Occupation: retired  Tobacco Use  . Smoking status: Former Smoker    Packs/day: 0.50    Types: Cigarettes    Quit date: 09/07/2015    Years since quitting: 5.1  . Smokeless tobacco: Never Used  Vaping Use  . Vaping Use: Never used  Substance and Sexual Activity  . Alcohol use: Yes    Alcohol/week: 14.0 standard drinks    Types: 14 Glasses of wine per week    Comment: 3 glasses of wine daily   . Drug use: No  . Sexual activity: Yes  Other Topics Concern  . Not on file  Social History Narrative  . Not on file   Social Determinants of Health   Financial Resource Strain: Low Risk   . Difficulty of Paying Living Expenses: Not hard at all  Food Insecurity: No Food Insecurity  . Worried About Charity fundraiser in the Last Year: Never true  . Ran Out of Food in the Last Year: Never true  Transportation Needs: No Transportation Needs  . Lack of Transportation (Medical): No  . Lack of Transportation (Non-Medical): No  Physical Activity: Not on file  Stress: Not on file  Social Connections: Not on file  Intimate Partner Violence: Not on file    Outpatient Medications Prior to Visit  Medication Sig Dispense Refill  . atorvastatin (LIPITOR) 10 MG tablet TAKE 1/2 TABLET (5 MG TOTAL) BY MOUTH EVERY OTHER DAY. 30 tablet 2  . b complex vitamins tablet Take 1 tablet by mouth every other day.    . Coenzyme Q10 200 MG capsule Take 200 mg by mouth once a week.     . famotidine (PEPCID) 20 MG tablet TAKE 1 TABLET BY MOUTH TWICE A DAY (Patient taking differently: Take 20 mg by mouth daily as needed.) 180 tablet 1  . metoprolol succinate (TOPROL-XL) 25 MG 24 hr tablet TAKE 1 TABLET BY MOUTH EVERY  DAY (Patient taking differently: Take 12.5 mg by mouth daily.) 90 tablet 1  . Multiple Vitamin (MULTIVITAMIN WITH MINERALS) TABS tablet Take 1 tablet by mouth every other day.    . Wheat Dextrin (BENEFIBER PO) Take by mouth.  No facility-administered medications prior to visit.    Allergies  Allergen Reactions  . Amoxicillin     REACTION: TROUBLE BREATHING Has patient had a PCN reaction causing immediate rash, facial/tongue/throat swelling, SOB or lightheadedness with hypotension:Yes Has patient had a PCN reaction causing severe rash involving mucus membranes or skin necrosis:No Has patient had a PCN reaction that required hospitalization:No Has patient had a PCN reaction occurring within the last 10 years:No If all of the above answers are "NO", then may proceed with Cephalosporin use.   Abe People Hfa [Albuterol] Other (See Comments)    Hypersensitivity, anxious, disoriented  . Penicillins Rash    Has patient had a PCN reaction causing immediate rash, facial/tongue/throat swelling, SOB or lightheadedness with hypotension:unknown Has patient had a PCN reaction causing severe rash involving mucus membranes or skin necrosis:unknown Has patient had a PCN reaction that required hospitalization:no Has patient had a PCN reaction occurring within the last 10 years:no If all of the above answers are "NO", then may proceed with Cephalosporin use.     Review of Systems  Constitutional: Negative for fever and malaise/fatigue.  HENT: Positive for ear pain. Negative for congestion, ear discharge and tinnitus.   Eyes: Negative for blurred vision.  Respiratory: Negative for shortness of breath.   Cardiovascular: Negative for chest pain, palpitations and leg swelling.  Gastrointestinal: Negative for abdominal pain, blood in stool and nausea.  Genitourinary: Negative for dysuria and frequency.  Musculoskeletal: Negative for falls.  Skin: Negative for rash.  Neurological: Positive for tremors.  Negative for dizziness, loss of consciousness and headaches.  Endo/Heme/Allergies: Negative for environmental allergies.  Psychiatric/Behavioral: Negative for depression. The patient is not nervous/anxious.        Objective:    Physical Exam Vitals and nursing note reviewed.  Constitutional:      General: She is not in acute distress.    Appearance: She is well-developed and well-nourished.  HENT:     Head: Normocephalic and atraumatic.     Right Ear: Ear canal and external ear normal. There is no impacted cerumen.     Left Ear: Ear canal and external ear normal. There is no impacted cerumen.     Ears:     Comments: Right TM with blue tympanostomy tube in place    Nose: Nose normal.  Eyes:     General:        Right eye: No discharge.        Left eye: No discharge.  Cardiovascular:     Rate and Rhythm: Normal rate and regular rhythm.     Heart sounds: No murmur heard.   Pulmonary:     Effort: Pulmonary effort is normal.     Breath sounds: Normal breath sounds.  Abdominal:     General: Bowel sounds are normal.     Palpations: Abdomen is soft.     Tenderness: There is no abdominal tenderness.  Musculoskeletal:        General: No edema.     Cervical back: Normal range of motion and neck supple.  Skin:    General: Skin is warm and dry.  Neurological:     Mental Status: She is alert and oriented to person, place, and time.  Psychiatric:        Mood and Affect: Mood and affect normal.     BP 124/70   Pulse 94   Temp 98.6 F (37 C)   Resp 18   Wt 213 lb (96.6 kg)   SpO2 95%  BMI 34.38 kg/m  Wt Readings from Last 3 Encounters:  10/23/20 213 lb (96.6 kg)  07/02/20 212 lb 6.4 oz (96.3 kg)  05/06/20 205 lb (93 kg)    Diabetic Foot Exam - Simple   No data filed    Lab Results  Component Value Date   WBC 6.3 10/23/2020   HGB 15.4 (H) 10/23/2020   HCT 45.3 10/23/2020   PLT 274.0 10/23/2020   GLUCOSE 101 (H) 10/23/2020   CHOL 238 (H) 10/23/2020   TRIG 112.0  10/23/2020   HDL 69.10 10/23/2020   LDLDIRECT 148.6 09/26/2008   LDLCALC 146 (H) 10/23/2020   ALT 27 10/23/2020   AST 20 10/23/2020   NA 136 10/23/2020   K 4.7 10/23/2020   CL 103 10/23/2020   CREATININE 0.78 10/23/2020   BUN 17 10/23/2020   CO2 26 10/23/2020   TSH 1.32 10/23/2020   HGBA1C 5.9 10/23/2020    Lab Results  Component Value Date   TSH 1.32 10/23/2020   Lab Results  Component Value Date   WBC 6.3 10/23/2020   HGB 15.4 (H) 10/23/2020   HCT 45.3 10/23/2020   MCV 100.0 10/23/2020   PLT 274.0 10/23/2020   Lab Results  Component Value Date   NA 136 10/23/2020   K 4.7 10/23/2020   CO2 26 10/23/2020   GLUCOSE 101 (H) 10/23/2020   BUN 17 10/23/2020   CREATININE 0.78 10/23/2020   BILITOT 0.8 10/23/2020   ALKPHOS 96 10/23/2020   AST 20 10/23/2020   ALT 27 10/23/2020   PROT 7.2 10/23/2020   ALBUMIN 4.3 10/23/2020   CALCIUM 9.4 10/23/2020   ANIONGAP 6 02/10/2017   GFR 74.66 10/23/2020   Lab Results  Component Value Date   CHOL 238 (H) 10/23/2020   Lab Results  Component Value Date   HDL 69.10 10/23/2020   Lab Results  Component Value Date   LDLCALC 146 (H) 10/23/2020   Lab Results  Component Value Date   TRIG 112.0 10/23/2020   Lab Results  Component Value Date   CHOLHDL 3 10/23/2020   Lab Results  Component Value Date   HGBA1C 5.9 10/23/2020       Assessment & Plan:   Problem List Items Addressed This Visit    H/O tobacco use, presenting hazards to health    Continues to abstain      Hyperlipidemia    Encouraged heart healthy diet, increase exercise, avoid trans fats, consider a krill oil cap daily. Tolerating Atorvastatin      Relevant Orders   Lipid panel (Completed)   Essential hypertension    Well controlled, no changes to meds. Encouraged heart healthy diet such as the DASH diet and exercise as tolerated.       Obesity    Encouraged MIND diet, decrease po intake and increase exercise as tolerated. Needs 7-8 hours of sleep  nightly. Avoid trans fats, eat small, frequent meals every 4-5 hours with lean proteins, complex carbs and healthy fats. Minimize simple carbs      GERD (gastroesophageal reflux disease)    Avoid offending foods, start probiotics. Do not eat large meals in late evening and consider raising head of bed.       Hyperglycemia    hgba1c acceptable, minimize simple carbs. Increase exercise as tolerated.       Relevant Orders   Hemoglobin A1c (Completed)   Vertigo   RESOLVED: Tobacco abuse   Hypertension    Well controlled, no changes to meds. Encouraged heart  healthy diet such as the DASH diet and exercise as tolerated.       Relevant Orders   CBC (Completed)   TSH (Completed)   Plantar wart of both feet - Primary    She is referred to podiatry for further evaluation.       Relevant Orders   Ambulatory referral to Podiatry    Other Visit Diagnoses    Bilateral foot pain       Relevant Orders   Comprehensive metabolic panel (Completed)   Ambulatory referral to Podiatry      I am having Alya L. Bryner maintain her b complex vitamins, multivitamin with minerals, Coenzyme Q10, Wheat Dextrin (BENEFIBER PO), metoprolol succinate, famotidine, and atorvastatin.  No orders of the defined types were placed in this encounter.    Penni Homans, MD

## 2020-10-26 NOTE — Assessment & Plan Note (Signed)
She is referred to podiatry for further evaluation.

## 2020-10-26 NOTE — Assessment & Plan Note (Signed)
Encouraged MIND diet, decrease po intake and increase exercise as tolerated. Needs 7-8 hours of sleep nightly. Avoid trans fats, eat small, frequent meals every 4-5 hours with lean proteins, complex carbs and healthy fats. Minimize simple carbs

## 2020-10-27 ENCOUNTER — Other Ambulatory Visit: Payer: Self-pay | Admitting: Family Medicine

## 2020-10-27 ENCOUNTER — Encounter: Payer: Self-pay | Admitting: Family Medicine

## 2020-10-27 DIAGNOSIS — R42 Dizziness and giddiness: Secondary | ICD-10-CM

## 2020-10-27 DIAGNOSIS — G250 Essential tremor: Secondary | ICD-10-CM

## 2020-10-30 ENCOUNTER — Encounter: Payer: Self-pay | Admitting: Podiatry

## 2020-10-30 ENCOUNTER — Other Ambulatory Visit: Payer: Self-pay

## 2020-10-30 ENCOUNTER — Ambulatory Visit: Payer: Medicare Other | Admitting: Podiatry

## 2020-10-30 DIAGNOSIS — D2372 Other benign neoplasm of skin of left lower limb, including hip: Secondary | ICD-10-CM | POA: Diagnosis not present

## 2020-10-30 DIAGNOSIS — D2371 Other benign neoplasm of skin of right lower limb, including hip: Secondary | ICD-10-CM

## 2020-10-30 NOTE — Progress Notes (Signed)
Subjective:  Patient ID: Renee [name], female    DOB: Aug 04, 1946,  MRN: [id number] HPI Chief Complaint  Patient presents with  . Skin Problem    Plantar feet bilateral - multiple small callused lesions x years, using Compound W, some areas are tender and raw  . New Patient (Initial Visit)    75 y.o. female presents with the above complaint.   ROS: Denies fever chills nausea vomiting muscle aches pains calf pain back pain chest pain shortness of breath.  Past Medical History:  Diagnosis Date  . Abdominal pain 07/05/2017  . ABNORMAL INVOLUNTARY MOVEMENTS 01/01/2009   Qualifier: Diagnosis of  By: Linna Darner MD, Gwyndolyn Saxon    . Allergic rhinitis   . Allergic rhinitis 09/19/2008   Qualifier: Diagnosis of  By: Wynona Luna   . Allergy   . Arthralgia of right wrist 05/06/2020  . Atypical chest pain 09/19/2008   Qualifier: Diagnosis of  By: Wynona Luna   . Cervical cancer screening 05/08/2013   Menarche 11, regular Menopause 73s G2P2 s/p 2 svd No abnormal paps or MGM Fatty cyst on labia removed.   . Cervical pain (neck) 10/07/2015  . CHEST PAIN 09/19/2008   Qualifier: Diagnosis of  By: Wynona Luna   . 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 Concussion 1969  . Diverticulosis 05/06/2017  . Essential hypertension 09/19/2008   Qualifier: Diagnosis of  By: Wynona Luna   . GERD (gastroesophageal reflux disease)   . H/O osteopenia 02/08/2016  . H/O tobacco use, presenting hazards to health 09/19/2008   Qualifier: Diagnosis of  By: Wynona Luna Smoking roughly 5 cig daily  Last cigarette was in December of 2016  . Head tremor 12/27/2016  . 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 Hx of adenomatous colonic polyps 01/27/2018  . Hyperglycemia 10/07/2016  . Hyperlipidemia   . Hyperlipidemia, mixed 10/03/2008   Qualifier: Diagnosis of  By: Wynona Luna   . Hypertension   .  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 Obesity 07/06/2016  . Occult blood positive stool   . Polyp of sigmoid colon 12/08/2016   11/2016 - Unable to remove or get bx at colonoscopy. Fixed sigmoid colon prevents. Referred for surgery.  Marland Kitchen PONV (postoperative nausea and vomiting)    ,   . PONV (postoperative nausea and vomiting)   . 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 Preventative health care 05/12/2013  . PVC's (premature ventricular contractions) 05/10/2017  . Rib pain on left side 11/17/2017  . Right chronic serous otitis media 05/21/2019  . Right hip pain 04/28/2020  . Shoulder pain 06/29/2018  . Strain of gluteus medius of right lower extremity 05/06/2020  . 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 Tobacco abuse   . Tremor, essential   . TREMOR, ESSENTIAL 09/19/2008   Qualifier: Diagnosis of  By: Wynona Luna   . Vertigo   . Vertigo    Past Surgical History:  Procedure Laterality Date  . INNER EAR SURGERY  2015   with tube  . MOUTH SURGERY  01/22/11   lower denture secured by titanium bolts  . rectal suction biospy    . XI ROBOTIC ASSISTED LOWER ANTERIOR RESECTION N/A 02/09/2017  Procedure: XI ROBOTIC RESECTION OF SIGMOID COLON, RIGID PROCTOSCOPY;  Surgeon: Michael Boston, MD;  Location: [facility];  Service: General;  Laterality: N/A;    Current Outpatient Medications:  .  atorvastatin (LIPITOR) 10 MG tablet, TAKE 1/2 TABLET (5 MG TOTAL) BY MOUTH EVERY OTHER DAY., Disp: 30 tablet, Rfl: 2 .  b complex vitamins tablet, Take 1 tablet by mouth every other day., Disp: , Rfl:  .  Coenzyme Q10 200 MG capsule, Take 200 mg by mouth once a week. , Disp: , Rfl:  .  famotidine (PEPCID) 20 MG tablet, TAKE 1 TABLET BY MOUTH TWICE A DAY (Patient taking differently: Take 20 mg by mouth daily as needed.), Disp: 180 tablet, Rfl: 1 .  metoprolol succinate (TOPROL-XL) 25 MG 24 hr tablet, TAKE 1 TABLET BY MOUTH EVERY DAY  (Patient taking differently: Take 12.5 mg by mouth daily.), Disp: 90 tablet, Rfl: 1 .  Multiple Vitamin (MULTIVITAMIN WITH MINERALS) TABS tablet, Take 1 tablet by mouth every other day., Disp: , Rfl:  .  Wheat Dextrin (BENEFIBER PO), Take by mouth., Disp: , Rfl:   Allergies  Allergen Reactions  . Amoxicillin     REACTION: TROUBLE BREATHING Has patient had a PCN reaction causing immediate rash, facial/tongue/throat swelling, SOB or lightheadedness with hypotension:Yes Has patient had a PCN reaction causing severe rash involving mucus membranes or skin necrosis:No Has patient had a PCN reaction that required hospitalization:No Has patient had a PCN reaction occurring within the last 10 years:No If all of the above answers are "NO", then may proceed with Cephalosporin use.   Abe People Hfa [Albuterol] Other (See Comments)    Hypersensitivity, anxious, disoriented  . Penicillins Rash    Has patient had a PCN reaction causing immediate rash, facial/tongue/throat swelling, SOB or lightheadedness with hypotension:unknown Has patient had a PCN reaction causing severe rash involving mucus membranes or skin necrosis:unknown Has patient had a PCN reaction that required hospitalization:no Has patient had a PCN reaction occurring within the last 10 years:no If all of the above answers are "NO", then may proceed with Cephalosporin use.    Review of Systems Objective:  There were no vitals filed for this visit.  General: Well developed, nourished, in no acute distress, alert and oriented x3   Dermatological: Skin is warm, dry and supple bilateral. Nails x 10 are well maintained; remaining integument appears unremarkable at this time. There are no open sores, no preulcerative lesions, no rash or signs of infection present.  She has multiple porokeratotic lesions plantar aspect of the bilateral foot some are raised some or not there is some small fissures and some scaly skin associated with this as well  it looks like a combination between porokeratosis as well as pitted keratolysis and possibly even psoriasis.  Vascular: Dorsalis Pedis artery and Posterior Tibial artery pedal pulses are 2/4 bilateral with immedate capillary fill time. Pedal hair growth present. No varicosities and no lower extremity edema present bilateral.   Neruologic: Grossly intact via light touch bilateral. Vibratory intact via tuning fork bilateral. Protective threshold with Semmes Wienstein monofilament intact to all pedal sites bilateral. Patellar and Achilles deep tendon reflexes 2+ bilateral. No Babinski or clonus noted bilateral.   Musculoskeletal: No gross boney pedal deformities bilateral. No pain, crepitus, or limitation noted with foot and ankle range of motion bilateral. Muscular strength 5/5 in all groups tested bilateral.  Gait: Unassisted, Nonantalgic.    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  Plan: She will follow up with Dr.Galaway for routine maintenance of her nails and her porokeratosis.  I debrided them for her today.  Recommended that she follow-up with dermatology.     Max T. Winfield, [place]

## 2020-11-03 ENCOUNTER — Encounter: Payer: Self-pay | Admitting: Podiatry

## 2020-11-10 ENCOUNTER — Ambulatory Visit: Payer: Medicare Other | Attending: Family Medicine | Admitting: Physical Therapy

## 2020-11-10 ENCOUNTER — Encounter: Payer: Self-pay | Admitting: Physical Therapy

## 2020-11-10 ENCOUNTER — Other Ambulatory Visit: Payer: Self-pay

## 2020-11-10 DIAGNOSIS — H8113 Benign paroxysmal vertigo, bilateral: Secondary | ICD-10-CM | POA: Insufficient documentation

## 2020-11-10 DIAGNOSIS — M542 Cervicalgia: Secondary | ICD-10-CM | POA: Insufficient documentation

## 2020-11-10 DIAGNOSIS — R42 Dizziness and giddiness: Secondary | ICD-10-CM | POA: Diagnosis not present

## 2020-11-10 NOTE — Therapy (Signed)
[place] [place], [place], [address] Phone: [phone]   Fax:  [phone]  Physical Therapy Evaluation  Patient Details  Name: Renee [name] MRN: [id number] Date of Birth: [birth year] Referring Provider (PT): Enedina Finner Date: 11/10/2020   PT End of Session - 11/10/20 1048    Visit Number 1    Date for PT Re-Evaluation 01/10/21    Authorization Type [employer] Medicare    PT Start Time 1000    PT Stop Time 1048    PT Time Calculation (min) 48 min    Activity Tolerance Patient tolerated treatment well    Behavior During Therapy [facility] for tasks assessed/performed           Past Medical History:  Diagnosis Date  . Abdominal pain 07/05/2017  . ABNORMAL INVOLUNTARY MOVEMENTS 01/01/2009   Qualifier: Diagnosis of  By: Linna Darner MD, Gwyndolyn Saxon    . Allergic rhinitis   . Allergic rhinitis 09/19/2008   Qualifier: Diagnosis of  By: Wynona Luna   . Allergy   . Arthralgia of right wrist 05/06/2020  . Atypical chest pain 09/19/2008   Qualifier: Diagnosis of  By: Wynona Luna   . Cervical cancer screening 05/08/2013   Menarche 11, regular Menopause 72s G2P2 s/p 2 svd No abnormal paps or MGM Fatty cyst on labia removed.   . Cervical pain (neck) 10/07/2015  . CHEST PAIN 09/19/2008   Qualifier: Diagnosis of  By: Wynona Luna   . 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 Concussion 1969  . Diverticulosis 05/06/2017  . Essential hypertension 09/19/2008   Qualifier: Diagnosis of  By: Wynona Luna   . GERD (gastroesophageal reflux disease)   . H/O osteopenia 02/08/2016  . H/O tobacco use, presenting hazards to health 09/19/2008   Qualifier: Diagnosis of  By: Wynona Luna Smoking roughly 5 cig daily  Last cigarette was in December of 2016  . Head tremor 12/27/2016  . 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 Hx of  adenomatous colonic polyps 01/27/2018  . Hyperglycemia 10/07/2016  . Hyperlipidemia   . Hyperlipidemia, mixed 10/03/2008   Qualifier: Diagnosis of  By: Wynona Luna   . Hypertension   . 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 Obesity 07/06/2016  . Occult blood positive stool   . Polyp of sigmoid colon 12/08/2016   11/2016 - Unable to remove or get bx at colonoscopy. Fixed sigmoid colon prevents. Referred for surgery.  Marland Kitchen PONV (postoperative nausea and vomiting)    ,   . PONV (postoperative nausea and vomiting)   . 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 Preventative health care 05/12/2013  . PVC's (premature ventricular contractions) 05/10/2017  . Rib pain on left side 11/17/2017  . Right chronic serous otitis media 05/21/2019  . Right hip pain 04/28/2020  . Shoulder pain 06/29/2018  . Strain of gluteus medius of right lower extremity 05/06/2020  . 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 Tobacco abuse   . Tremor, essential   . TREMOR, ESSENTIAL 09/19/2008   Qualifier: Diagnosis of  By: Wynona Luna   . Vertigo   . Vertigo     Past Surgical History:  Procedure  Laterality Date  . INNER EAR SURGERY  2015   with tube  . MOUTH SURGERY  01/22/11   lower denture secured by titanium bolts  . rectal suction biospy    . XI ROBOTIC ASSISTED LOWER ANTERIOR RESECTION N/A 02/09/2017   Procedure: XI ROBOTIC RESECTION OF SIGMOID COLON, RIGID PROCTOSCOPY;  Surgeon: Zlaty Alexa Boston, MD;  Location: [facility];  Service: General;  Laterality: N/A;    There were no vitals filed for this visit.    Subjective Assessment - 11/10/20 1005    Subjective Patient reports a right inner ear surgery and tube placement in 2015, she reports some dizziness since that time.  She reports that the dizziness and vertigo started to get worse about 2 years ago.  Reports always there but worse with allergies and sinus issues, worse looking down,     Currently in Pain? Yes    Pain Location Neck    Pain Orientation Right;Left    Pain Descriptors / Indicators Spasm;Tightness;Sore    Pain Onset Other (comment)    Pain Frequency Intermittent    Aggravating Factors  stress, dzziness seems to make me hold still, she does have a head tremor    Pain Relieving Factors rest              [facility] PT Assessment - 11/10/20 0001      Assessment   Medical Diagnosis vertigo, dizziness, cervicalgia    Referring Provider (PT) Charlett Blake    Onset Date/Surgical Date 10/13/20    Prior Therapy no      Precautions   Precautions None      Balance Screen   Has the patient fallen in the past 6 months Yes    How many times? 1    Has the patient had a decrease in activity level because of a fear of falling?  Yes    Is the patient reluctant to leave their home because of a fear of falling?  No      Home Environment   Additional Comments has stairs, does housework, yardwork, reports bending over causes dizziness      Prior Function   Level of Independence Independent    Vocation Retired    Leisure does some tai chi, does not go to the gym      ROM / Strength   AROM / PROM / Strength AROM      AROM   Overall AROM Comments Cervical ROM decreased 50% with some c/o soreness and dizziness      Palpation   Palpation comment she is very tight in the neck mmms and the upper traps she is very tender here as well                  Vestibular Assessment - 11/10/20 0001      Symptom Behavior   Subjective history of current problem 2 years, Just feel dizzy    Type of Dizziness  "World moves";Vertigo    Frequency of Dizziness once a week    Duration of Dizziness could last a day or more    Symptom Nature Spontaneous    Aggravating Factors Walking in a crowd;Moving eyes   looking down stairs   Relieving Factors Rest    Progression of Symptoms Worse      Oculomotor Exam   Gaze-induced  Age appropriate nystagmus at end range    Smooth Pursuits  Intact   does have significant head tremor but eyes can maintain fixation   Saccades Intact  Vestibulo-Ocular Reflex   VOR 1 Head Only (x 1 viewing) good    VOR Cancellation Unable to maintain gaze              Objective measurements completed on examination: See above findings.               PT Education - 11/10/20 1047    Education Details Gave HEP for VOR exercises, had some difficulty with this    Person(s) Educated Patient    Methods Explanation;Demonstration;Handout    Comprehension Verbalized understanding;Returned demonstration;Verbal cues required            PT Short Term Goals - 11/10/20 1053      PT SHORT TERM GOAL #1   Title independent with initial HEP    Time 2    Period Weeks    Status New             PT Long Term Goals - 11/10/20 1053      PT LONG TERM GOAL #1   Title independent with advanced HEP for VOR, possible BPPV and spasms    Time 8    Period Weeks    Status New      PT LONG TERM GOAL #2   Title report decreased dizziness overall 25%    Time 8    Period Weeks    Status New      PT LONG TERM GOAL #3   Title report decreased neck pain 25%    Time 8    Period Weeks    Status New      PT LONG TERM GOAL #4   Title increase cervical ROM 25%    Time 8    Period Weeks    Status New                  Plan - 11/10/20 1049    Clinical Impression Statement Patient reports a right inner ear surgery in 2015, reports some issues with dizziness since that time, she reports that is has gotten worse over the past two years.  She does have a significant head tremor that she says is hereditary.  She has a loss of hearing in the right ear.  Describes dizziness with looking down stairs, or in busy places.  Reports that it will last a full day at times.  Reports that rest makes it better, she does not describe any eye movements and no specific head motion that will cause the dizziness.  She has significant tightness and spasms  in the right upper trap and the right cervical aretender as well.  She had difficulty with VOR activities with over tracking and then correction with gaze cancellation and smooth pursuits and sacades.    Clinical Decision Making Low    Rehab Potential Good    PT Frequency 1x / week    PT Duration 8 weeks    PT Treatment/Interventions ADLs/Self Care Home Management;Electrical Stimulation;Moist Heat;Therapeutic exercise;Balance training;Neuromuscular re-education;Manual techniques;Patient/family education    PT Next Visit Plan see how the VOR exercises went, may want to try [place], may want to try her going down and large staircase and work on the spasms    Consulted and Agree with Plan of Care Patient           Patient will benefit from skilled therapeutic intervention in order to improve the following deficits and impairments:  Decreased range of motion,Increased muscle spasms,Dizziness,Decreased balance,Pain,Postural dysfunction  Visit Diagnosis: Dizziness and giddiness - Plan: PT  plan of care cert/re-cert  Cervicalgia - Plan: PT plan of care cert/re-cert  BPPV (benign paroxysmal positional vertigo), bilateral - Plan: PT plan of care cert/re-cert     Problem List Patient Active Problem List   Diagnosis Date Noted  . Plantar wart of both feet 10/26/2020  . Vertigo   . Tremor, essential   . PONV (postoperative nausea and vomiting)   . Occult blood positive stool   . Neuromuscular disorder ([place])   . Hypertension   . Allergy   . Arthralgia of right wrist 05/06/2020  . Strain of gluteus medius of right lower extremity 05/06/2020  . Right hip pain 04/28/2020  . Right chronic serous otitis media 05/21/2019  . Dermatitis 09/17/2018  . 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 Shoulder pain 06/29/2018  . Hx of adenomatous colonic polyps 01/27/2018  . Tinnitus 12/12/2017  . Rib pain on left side 11/17/2017  . Abdominal pain 07/05/2017  . PVC's (premature  ventricular contractions) 05/10/2017  . Diverticulosis 05/06/2017  . 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 Head tremor 12/27/2016  . Polyp of sigmoid colon 12/08/2016  . Hyperglycemia 10/07/2016  . Obesity 07/06/2016  . GERD (gastroesophageal reflux disease) 07/06/2016  . H/O osteopenia 02/08/2016  . Cervical pain (neck) 10/07/2015  . Preventative health care 05/12/2013  . Cervical cancer screening 05/08/2013  . ABNORMAL INVOLUNTARY MOVEMENTS 01/01/2009  . Hyperlipidemia, mixed 10/03/2008  . H/O tobacco use, presenting hazards to health 09/19/2008  . Hyperlipidemia 09/19/2008  . 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 Essential hypertension 09/19/2008  . Allergic rhinitis 09/19/2008  . Atypical chest pain 09/19/2008  . Hearing loss in right ear 09/19/2008  . CHEST PAIN 09/19/2008  . Concussion 1969    Sumner Boast., PT 11/10/2020, 10:56 AM  [place]. [place], [place], [address] Phone: [phone]   Fax:  [phone]  Name: DEMMI SINDT MRN: [id number] Date of Birth: [birth year]

## 2020-11-19 ENCOUNTER — Encounter: Payer: Self-pay | Admitting: Physical Therapy

## 2020-11-19 ENCOUNTER — Ambulatory Visit: Payer: Medicare Other | Admitting: Physical Therapy

## 2020-11-19 ENCOUNTER — Other Ambulatory Visit: Payer: Self-pay

## 2020-11-19 DIAGNOSIS — M542 Cervicalgia: Secondary | ICD-10-CM | POA: Diagnosis not present

## 2020-11-19 DIAGNOSIS — H8113 Benign paroxysmal vertigo, bilateral: Secondary | ICD-10-CM

## 2020-11-19 DIAGNOSIS — R42 Dizziness and giddiness: Secondary | ICD-10-CM

## 2020-11-19 NOTE — Therapy (Signed)
[place] [place], [place], [address] Phone: [phone]   Fax:  [phone]  Physical Therapy Treatment  Patient Details  Name: Renee [name] MRN: [id number] Date of Birth: 11-Jun-1946 Referring Provider (PT): Enedina Finner Date: 11/19/2020   PT End of Session - 11/19/20 1137    Visit Number 2    Date for PT Re-Evaluation 01/10/21    Authorization Type U[employer] Medicare    PT Start Time 1055    PT Stop Time 1142    PT Time Calculation (min) 47 min    Activity Tolerance Patient tolerated treatment well    Behavior During Therapy [facility] for tasks assessed/performed           Past Medical History:  Diagnosis Date  . Abdominal pain 07/05/2017  . ABNORMAL INVOLUNTARY MOVEMENTS 01/01/2009   Qualifier: Diagnosis of  By: Linna Darner MD, Gwyndolyn Saxon    . Allergic rhinitis   . Allergic rhinitis 09/19/2008   Qualifier: Diagnosis of  By: Wynona Luna   . Allergy   . Arthralgia of right wrist 05/06/2020  . Atypical chest pain 09/19/2008   Qualifier: Diagnosis of  By: Wynona Luna   . Cervical cancer screening 05/08/2013   Menarche 11, regular Menopause 78s G2P2 s/p 2 svd No abnormal paps or [employer] Fat[employer] cyst on labia removed.   . Cervical pain (neck) 10/07/2015  . CHEST PAIN 09/19/2008   Qualifier: Diagnosis of  By: Wynona Luna   . 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 Concussion 1969  . Diverticulosis 05/06/2017  . Essential hypertension 09/19/2008   Qualifier: Diagnosis of  By: Wynona Luna   . GERD (gastroesophageal reflux disease)   . H/O osteopenia 02/08/2016  . H/O tobacco use, presenting hazards to health 09/19/2008   Qualifier: Diagnosis of  By: Wynona Luna Smoking roughly 5 cig daily  Last cigarette was in December of 2016  . Head tremor 12/27/2016  . 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 Hx of  adenomatous colonic polyps 01/27/2018  . Hyperglycemia 10/07/2016  . Hyperlipidemia   . Hyperlipidemia, mixed 10/03/2008   Qualifier: Diagnosis of  By: Wynona Luna   . Hypertension   . 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 Obesity 07/06/2016  . Occult blood positive stool   . Polyp of sigmoid colon 12/08/2016   11/2016 - Unable to remove or get bx at colonoscopy. Fixed sigmoid colon prevents. Referred for surgery.  Marland Kitchen PONV (postoperative nausea and vomiting)    ,   . PONV (postoperative nausea and vomiting)   . 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 Preventative health care 05/12/2013  . PVC's (premature ventricular contractions) 05/10/2017  . Rib pain on left side 11/17/2017  . Right chronic serous otitis media 05/21/2019  . Right hip pain 04/28/2020  . Shoulder pain 06/29/2018  . Strain of gluteus medius of right lower extremity 05/06/2020  . 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 Tobacco abuse   . Tremor, essential   . TREMOR, ESSENTIAL 09/19/2008   Qualifier: Diagnosis of  By: Wynona Luna   . Vertigo   . Vertigo     Past Surgical History:  Procedure  Laterality Date  . INNER EAR SURGERY  2015   with tube  . MOUTH SURGERY  01/22/11   lower denture secured by titanium bolts  . rectal suction biospy    . XI ROBOTIC ASSISTED LOWER ANTERIOR RESECTION N/A 02/09/2017   Procedure: XI ROBOTIC RESECTION OF SIGMOID COLON, RIGID PROCTOSCOPY;  Surgeon: Chalsea Darko Boston, MD;  Location: [facility];  Service: General;  Laterality: N/A;    There were no vitals filed for this visit.   Subjective Assessment - 11/19/20 1053    Subjective I am a little dizzy today, reports that the exercises made her eyes sore.  She reports that the neck is still very tight    Currently in Pain? Yes    Pain Score 3     Pain Location Neck    Pain Orientation Right    Pain Descriptors / Indicators Tightness;Spasm    Aggravating Factors   stress                             [facility] Adult PT Treatment/Exercise - 11/19/20 0001      Modalities   Modalities Electrical Stimulation;Moist Heat      Moist Heat Therapy   Number Minutes Moist Heat 10 Minutes    Moist Heat Location Cervical      Electrical Stimulation   Electrical Stimulation Location right upper trap    Electrical Stimulation Action IFC    Electrical Stimulation Parameters sitting    Electrical Stimulation Goals Pain      Manual Therapy   Manual Therapy Soft tissue mobilization    Soft tissue mobilization to the right upper trap and the cervical area to a large knot           Vestibular Treatment/Exercise - 11/19/20 0001      Vestibular Treatment/Exercise   Vestibular Treatment Provided Canalith Repositioning;Habituation    Canalith Repositioning Epley Manuever Right;Epley Manuever Left    Habituation Exercises Brandt Daroff       EPLEY MANUEVER RIGHT   Number of Reps  1    Overall Response No change       EPLEY MANUEVER LEFT   Number of Reps  1    Overall Response  No change      Nestor Lewandowsky   Number of Reps  5    Symptom Description  a little dizzy                 PT Education - 11/19/20 1137    Education Details gave brandt-daroff handout asked her to try 1 set 1x/day    Person(s) Educated Patient    Methods Explanation;Demonstration;Handout    Comprehension Verbalized understanding            PT Short Term Goals - 11/19/20 1141      PT SHORT TERM GOAL #1   Title independent with initial HEP    Status Partially Met             PT Long Term Goals - 11/10/20 1053      PT LONG TERM GOAL #1   Title independent with advanced HEP for VOR, possible BPPV and spasms    Time 8    Period Weeks    Status New      PT LONG TERM GOAL #2   Title report decreased dizziness overall 25%    Time 8    Period Weeks    Status New  PT LONG TERM GOAL #3   Title report decreased neck pain 25%    Time 8     Period Weeks    Status New      PT LONG TERM GOAL #4   Title increase cervical ROM 25%    Time 8    Period Weeks    Status New                 Plan - 11/19/20 1138    Clinical Impression Statement Patient reported some eye mm soreness after the VOR eercises, she reports no real change in her dizziness, I tried Epley Manuever bilaterally without any provocation of dizziness or nystagmus.  This makes me feel that it is the low function of the VOR and vestibular system, I demoed and gave HEP for Brand-Daroff.  She does have a lot of tightness and spasms in the right upper trap and right neck area.    PT Next Visit Plan may try the stairs, see how the neck is and see if the Brandt-daroff exercises changed her symptoms of dizziness    Consulted and Agree with Plan of Care Patient           Patient will benefit from skilled therapeutic intervention in order to improve the following deficits and impairments:  Decreased range of motion,Increased muscle spasms,Dizziness,Decreased balance,Pain,Postural dysfunction  Visit Diagnosis: Dizziness and giddiness  Cervicalgia  BPPV (benign paroxysmal positional vertigo), bilateral     Problem List Patient Active Problem List   Diagnosis Date Noted  . Plantar wart of both feet 10/26/2020  . Vertigo   . Tremor, essential   . PONV (postoperative nausea and vomiting)   . Occult blood positive stool   . Neuromuscular disorder ([place])   . Hypertension   . Allergy   . Arthralgia of right wrist 05/06/2020  . Strain of gluteus medius of right lower extremity 05/06/2020  . Right hip pain 04/28/2020  . Right chronic serous otitis media 05/21/2019  . Dermatitis 09/17/2018  . 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 Shoulder pain 06/29/2018  . Hx of adenomatous colonic polyps 01/27/2018  . Tinnitus 12/12/2017  . Rib pain on left side 11/17/2017  . Abdominal pain 07/05/2017  . PVC's (premature ventricular  contractions) 05/10/2017  . Diverticulosis 05/06/2017  . 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 Head tremor 12/27/2016  . Polyp of sigmoid colon 12/08/2016  . Hyperglycemia 10/07/2016  . Obesity 07/06/2016  . GERD (gastroesophageal reflux disease) 07/06/2016  . H/O osteopenia 02/08/2016  . Cervical pain (neck) 10/07/2015  . Preventative health care 05/12/2013  . Cervical cancer screening 05/08/2013  . ABNORMAL INVOLUNTARY MOVEMENTS 01/01/2009  . Hyperlipidemia, mixed 10/03/2008  . H/O tobacco use, presenting hazards to health 09/19/2008  . Hyperlipidemia 09/19/2008  . 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 Essential hypertension 09/19/2008  . Allergic rhinitis 09/19/2008  . Atypical chest pain 09/19/2008  . Hearing loss in right ear 09/19/2008  . CHEST PAIN 09/19/2008  . Concussion 1969    Sumner Boast., PT 11/19/2020, 11:41 AM  [place] [place], [place], [address] Phone: [phone]   Fax:  [phone]  Name: Renee [name] MRN: [id number] Date of Birth: 23-Dec-1945

## 2020-11-22 ENCOUNTER — Other Ambulatory Visit: Payer: Self-pay | Admitting: Family Medicine

## 2020-11-26 ENCOUNTER — Ambulatory Visit: Payer: Medicare Other | Attending: Family Medicine | Admitting: Physical Therapy

## 2020-11-26 ENCOUNTER — Encounter: Payer: Self-pay | Admitting: Physical Therapy

## 2020-11-26 ENCOUNTER — Other Ambulatory Visit: Payer: Self-pay

## 2020-11-26 DIAGNOSIS — M542 Cervicalgia: Secondary | ICD-10-CM | POA: Diagnosis not present

## 2020-11-26 DIAGNOSIS — R42 Dizziness and giddiness: Secondary | ICD-10-CM | POA: Insufficient documentation

## 2020-11-26 DIAGNOSIS — H8113 Benign paroxysmal vertigo, bilateral: Secondary | ICD-10-CM | POA: Insufficient documentation

## 2020-11-26 NOTE — Therapy (Signed)
[name], [place], [address] Phone: [phone]   Fax:  [phone]  Physical Therapy Treatment  Patient Details  Name: Renee [name] MRN: [id number] Date of Birth: 1945-10-16 Referring Provider (PT): Enedina Finner Date: 11/26/2020   PT End of Session - 11/26/20 1152    Visit Number 3    Date for PT Re-Evaluation 01/10/21    Authorization Type UHC Medicare    PT Start Time 1100    PT Stop Time 1150    PT Time Calculation (min) 50 min    Activity Tolerance Patient tolerated treatment well    Behavior During Therapy [facility] for tasks assessed/performed;Anxious           Past Medical History:  Diagnosis Date  . Abdominal pain 07/05/2017  . ABNORMAL INVOLUNTARY MOVEMENTS 01/01/2009   Qualifier: Diagnosis of  By: Linna Darner MD, Gwyndolyn Saxon    . Allergic rhinitis   . Allergic rhinitis 09/19/2008   Qualifier: Diagnosis of  By: Wynona Luna   . Allergy   . Arthralgia of right wrist 05/06/2020  . Atypical chest pain 09/19/2008   Qualifier: Diagnosis of  By: Wynona Luna   . Cervical cancer screening 05/08/2013   Menarche 11, regular Menopause 89s G2P2 s/p 2 svd No abnormal paps or M[employer] Fatty cyst on labia removed.   . Cervical pain (neck) 10/07/2015  . CHEST PAIN 09/19/2008   Qualifier: Diagnosis of  By: Wynona Luna   . 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 Concussion 1969  . Diverticulosis 05/06/2017  . Essential hypertension 09/19/2008   Qualifier: Diagnosis of  By: Wynona Luna   . GERD (gastroesophageal reflux disease)   . H/O osteopenia 02/08/2016  . H/O tobacco use, presenting hazards to health 09/19/2008   Qualifier: Diagnosis of  By: Wynona Luna Smoking roughly 5 cig daily  Last cigarette was in December of 2016  . Head tremor 12/27/2016  . 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  Hx of adenomatous colonic polyps 01/27/2018  . Hyperglycemia 10/07/2016  . Hyperlipidemia   . Hyperlipidemia, mixed 10/03/2008   Qualifier: Diagnosis of  By: Wynona Luna   . Hypertension   . 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 Obesity 07/06/2016  . Occult blood positive stool   . Polyp of sigmoid colon 12/08/2016   11/2016 - Unable to remove or get bx at colonoscopy. Fixed sigmoid colon prevents. Referred for surgery.  Marland Kitchen PONV (postoperative nausea and vomiting)    ,   . PONV (postoperative nausea and vomiting)   . 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 Preventative health care 05/12/2013  . PVC's (premature ventricular contractions) 05/10/2017  . Rib pain on left side 11/17/2017  . Right chronic serous otitis media 05/21/2019  . Right hip pain 04/28/2020  . Shoulder pain 06/29/2018  . Strain of gluteus medius of right lower extremity 05/06/2020  . 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 Tobacco abuse   . Tremor, essential   . TREMOR, ESSENTIAL 09/19/2008   Qualifier: Diagnosis of  By: Wynona Luna   . Vertigo   . Vertigo     Past Surgical History:  Procedure  Laterality Date  . INNER EAR SURGERY  2015   with tube  . MOUTH SURGERY  01/22/11   lower denture secured by titanium bolts  . rectal suction biospy    . XI ROBOTIC ASSISTED LOWER ANTERIOR RESECTION N/A 02/09/2017   Procedure: XI ROBOTIC RESECTION OF SIGMOID COLON, RIGID PROCTOSCOPY;  Surgeon: Shade Kaley Boston, MD;  Location: [facility];  Service: General;  Laterality: N/A;    There were no vitals filed for this visit.   Subjective Assessment - 11/26/20 1057    Subjective Patient reports tightness in the right upper trap and neck, reports "feels like fluid in my ear so a little wobbly"    Pain Score 3     Pain Location Neck    Pain Orientation Right    Pain Descriptors / Indicators Spasm;Tightness                              [facility] Adult PT Treatment/Exercise - 11/26/20 0001      Ambulation/Gait   Gait Comments outside in the grass, uneven surfaces, curbs, picking up cones, I took her over to the old building to look down the full flight of stairs as she reports that this is a problem.  See clinical impression.      High Level Balance   High Level Balance Activities Side stepping;Direction changes;Backward walking    High Level Balance Comments airex tandem walk, side stepping, feet together eyes closed.      Manual Therapy   Manual Therapy Soft tissue mobilization    Soft tissue mobilization to the right upper trap and the cervical area to a large knot                    PT Short Term Goals - 11/19/20 1141      PT SHORT TERM GOAL #1   Title independent with initial HEP    Status Partially Met             PT Long Term Goals - 11/26/20 1200      PT LONG TERM GOAL #1   Title independent with advanced HEP for VOR, possible BPPV and spasms    Status On-going      PT LONG TERM GOAL #2   Title report decreased dizziness overall 25%    Status On-going      PT LONG TERM GOAL #3   Title report decreased neck pain 25%    Status Partially Met                 Plan - 11/26/20 1153    Clinical Impression Statement Patinet continues to have the decreased sense of balance, she did great with walking outside, only loss of balance I saw was when she had the significant tremor of the head and had some loss of balance to the side.  She reports that the ineer ear issue started after a long ago surgery.  She had mentioned the issue with looking over things from a height and reports that she has a fear or freezes with this, I took her to the old building and was going to have her look down a full flight of stairs, she froze, she could not do this, She froze outside the stairwell and looked away and said "I can't".  She reports able to go down stairs at home holding on, but the bigger stair case and the  higher height she  could not go in the stairwell. we talked about did she feel safe, unsafe was it a rational fear or irrational.  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  But may have other issues with the inner ear, the head tremor and the anxiety or fear of falling.  I talked to her about making appointments with the audiologist, her primary care provider and possible neuro referral vs. psychology    PT Next Visit Plan may want to try to do the stairs and really see if we can do this overcome the anxiety    Consulted and Agree with Plan of Care Patient           Patient will benefit from skilled therapeutic intervention in order to improve the following deficits and impairments:  Decreased range of motion,Increased muscle spasms,Dizziness,Decreased balance,Pain,Postural dysfunction  Visit Diagnosis: Dizziness and giddiness  Cervicalgia  BPPV (benign paroxysmal positional vertigo), bilateral     Problem List Patient Active Problem List   Diagnosis Date Noted  . Plantar wart of both feet 10/26/2020  . Vertigo   . Tremor, essential   . PONV (postoperative nausea and vomiting)   . Occult blood positive stool   . Neuromuscular disorder ([place])   . Hypertension   . Allergy   . Arthralgia of right wrist 05/06/2020  . Strain of gluteus medius of right lower extremity 05/06/2020  . Right hip pain 04/28/2020  . Right chronic serous otitis media 05/21/2019  . Dermatitis 09/17/2018  . 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 Shoulder pain 06/29/2018  . Hx of adenomatous colonic polyps 01/27/2018  . Tinnitus 12/12/2017  . Rib pain on left side 11/17/2017  . Abdominal pain 07/05/2017  . PVC's (premature ventricular contractions) 05/10/2017  . Diverticulosis 05/06/2017  . Stricture of sigmoid  colon s/p colectomy 02/09/2017 02/09/2017  . Head tremor 12/27/2016  . Polyp of sigmoid colon 12/08/2016  . Hyperglycemia 10/07/2016  . Obesity 07/06/2016  . GERD (gastroesophageal reflux disease) 07/06/2016  . H/O osteopenia 02/08/2016  . Cervical pain (neck) 10/07/2015  . Preventative health care 05/12/2013  . Cervical cancer screening 05/08/2013  . ABNORMAL INVOLUNTARY MOVEMENTS 01/01/2009  . Hyperlipidemia, mixed 10/03/2008  . H/O tobacco use, presenting hazards to health 09/19/2008  . Hyperlipidemia 09/19/2008  . 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 Essential hypertension 09/19/2008  . Allergic rhinitis 09/19/2008  . Atypical chest pain 09/19/2008  . Hearing loss in right ear 09/19/2008  . CHEST PAIN 09/19/2008  . Concussion 1969    Sumner Boast., PT 11/26/2020, 12:01 PM  [place] [place], [place], [address] Phone: [phone]   Fax:  [phone]  Name: PRECIOUS SEGALL MRN: [id number] Date of Birth: Mar 14, 1946

## 2020-12-09 IMAGING — RF DG ESOPHAGUS
6 series · 17 of 21 positions shown · non-contrast
Comparison: None.

CLINICAL DATA: Gastroesophageal reflux disease.  Dysphagia.

EXAM:
UPPER GI SERIES WITH KUB
TECHNIQUE: After obtaining a scout radiograph a routine upper GI series was
performed using thin and high density barium.
FLUOROSCOPY TIME:  Fluoroscopy Time:  1 minutes 12 second
Radiation Exposure Index (if provided by the fluoroscopic device):
Number of Acquired Spot Images: 0

[Series 1: cp_standard · 0.38mm/px · 3 of 156 frames shown (1 of 6)]
[frame 24/156]
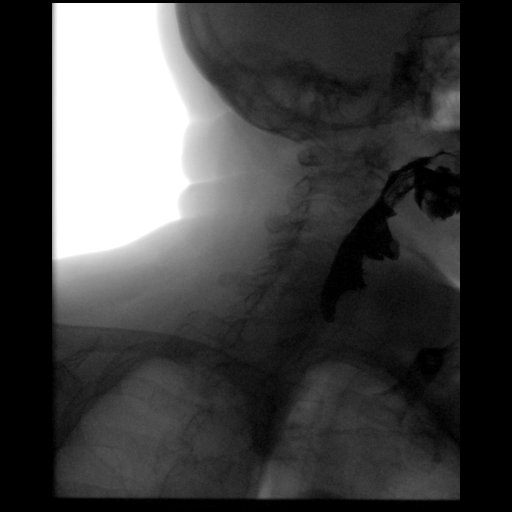
[frame 79/156]
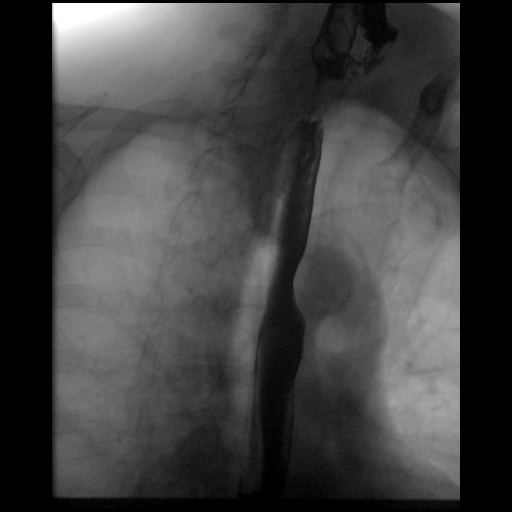
[frame 133/156]
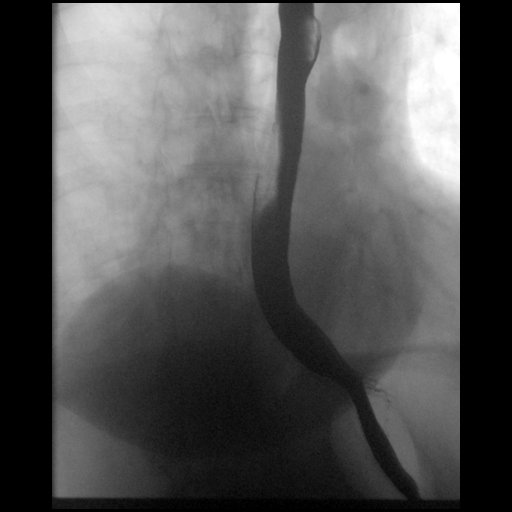

[Series 2: cp_standard · 0.38mm/px · 3 of 92 frames shown (2 of 6)]
[frame 14/92]
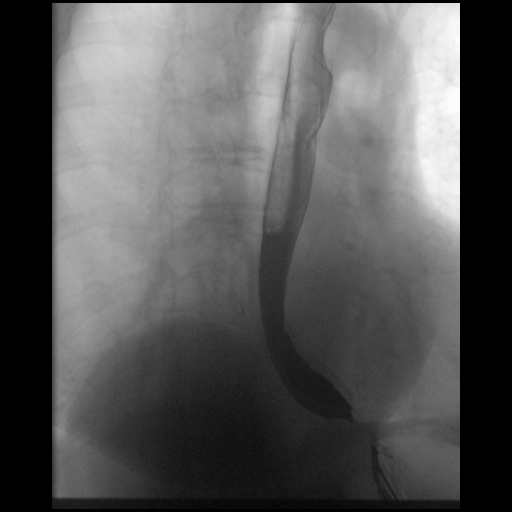
[frame 47/92]
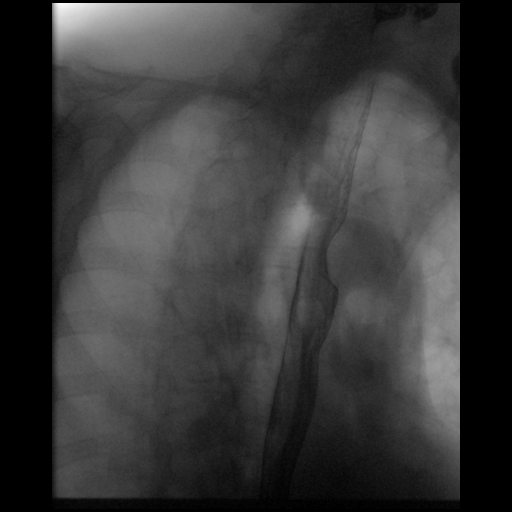
[frame 79/92]
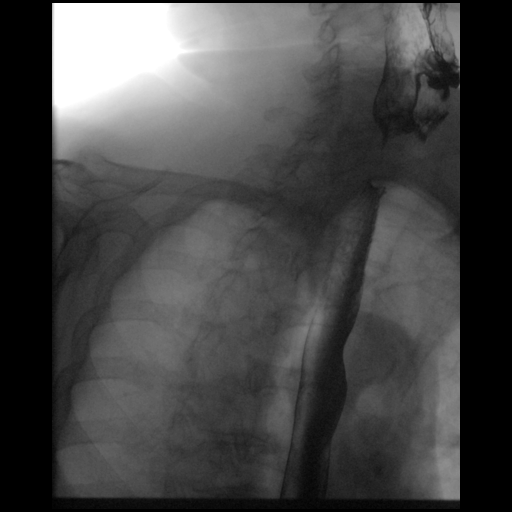

[Series 3: cp_standard · 0.36mm/px · 4 of 118 frames shown (3 of 6)]
[frame 18/118]
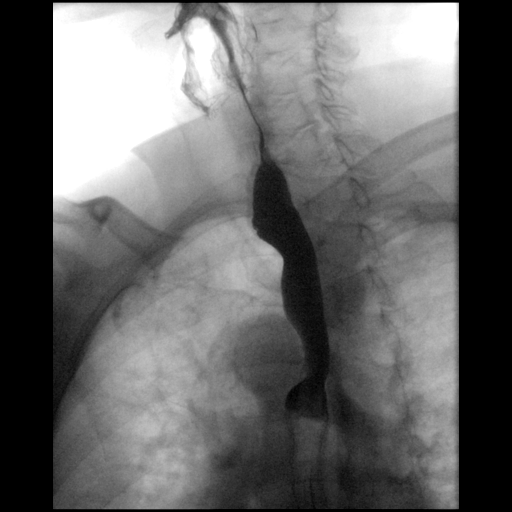
[frame 60/118]
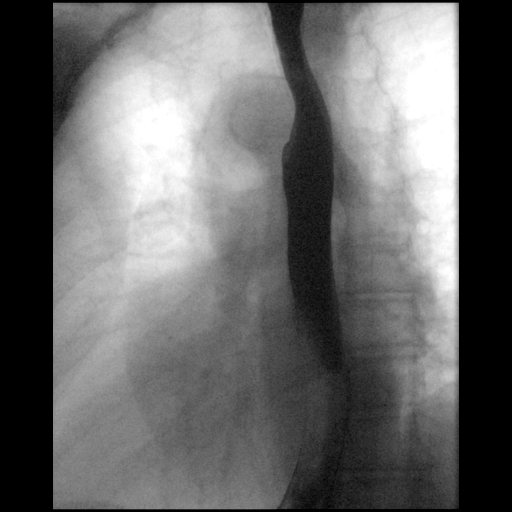
[frame 101/118]
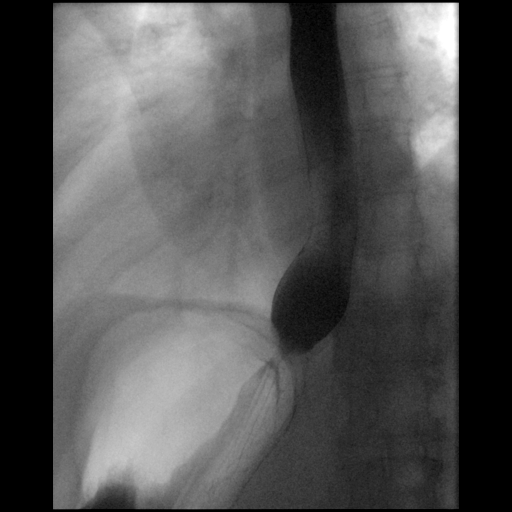
[frame 114/118]
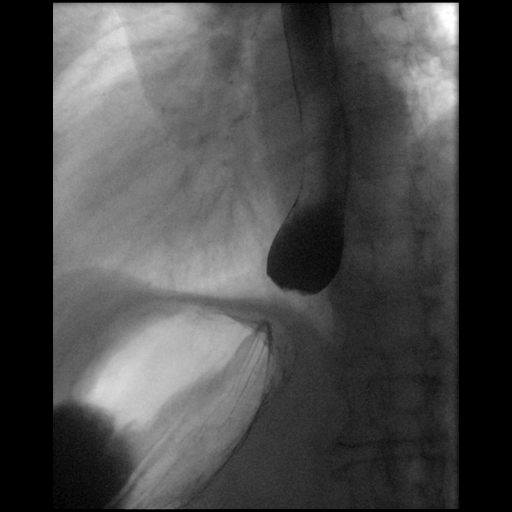

[Series 4: cp_standard · 0.36mm/px · 3 of 113 frames shown (4 of 6)]
[frame 17/113]
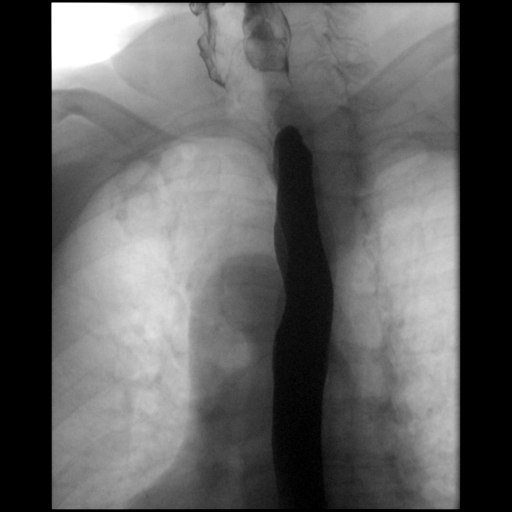
[frame 87/113]
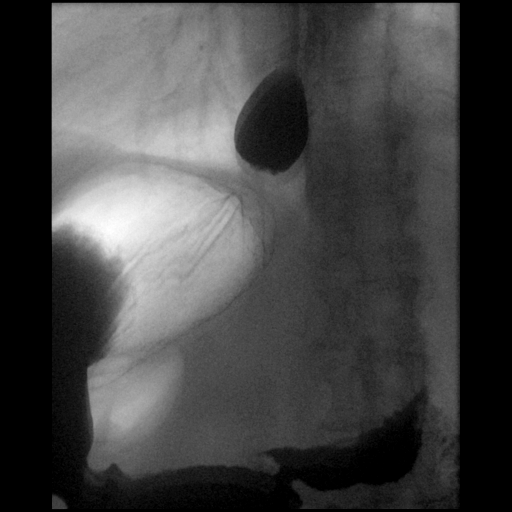
[frame 97/113]
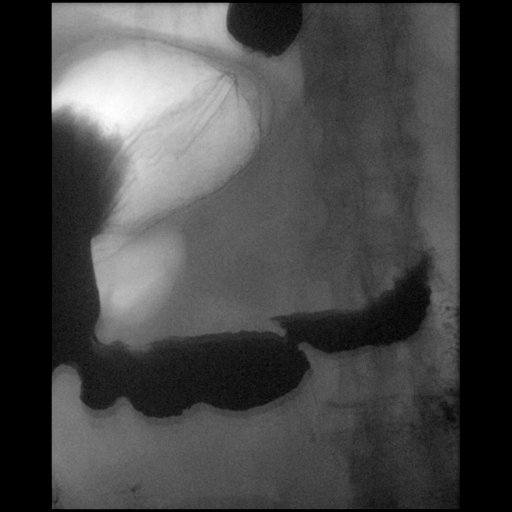

[Series 5: cp_standard · 0.34mm/px · 3 of 103 frames shown (5 of 6)]
[frame 16/103]
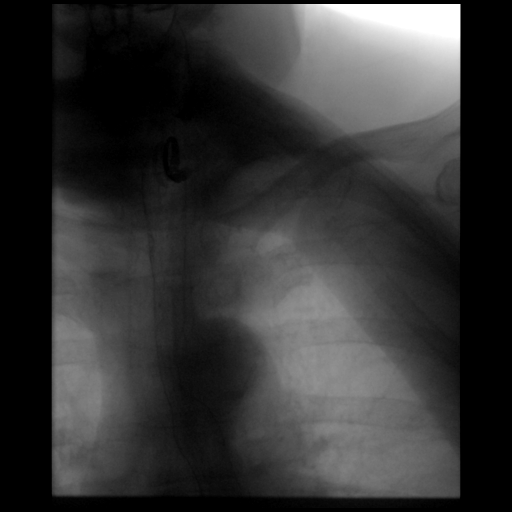
[frame 42/103]
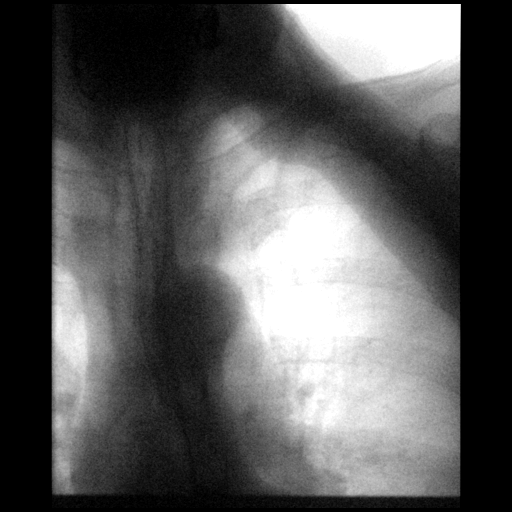
[frame 88/103]
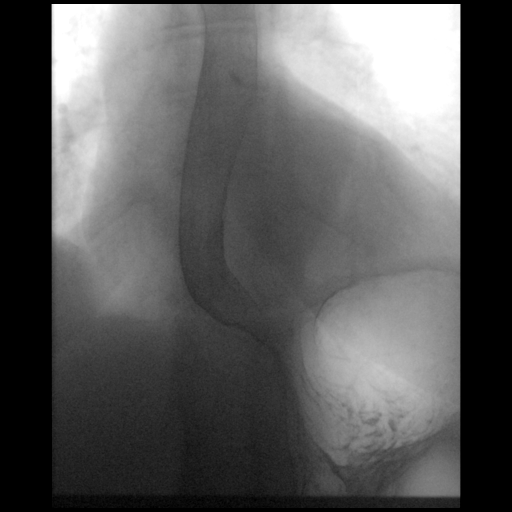

[Series 6: cp_standard · 0.18mm/px · 1 of 1 slices shown (6 of 6)]
[im 1/1]
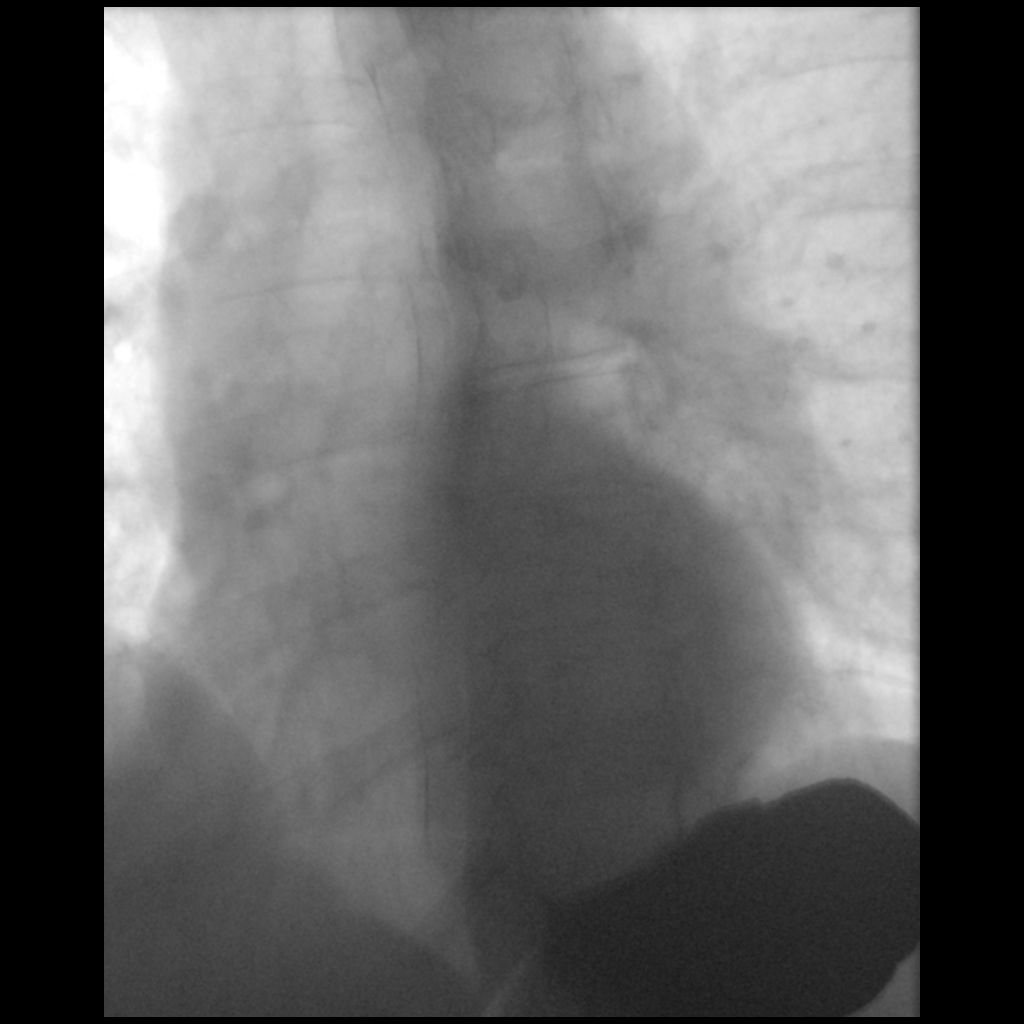

[17 of 21 positions shown; findings below may reference images not displayed]

FINDINGS: Esophageal mucosa and motility normal.  No stricture or mass.

Small hiatal hernia with mild gastroesophageal reflux disease

Barium tablet passed readily into the stomach without delay.
IMPRESSION: Small hiatal hernia with mild gastroesophageal reflux. Negative for
stricture.

## 2020-12-10 ENCOUNTER — Other Ambulatory Visit: Payer: Self-pay

## 2020-12-10 ENCOUNTER — Ambulatory Visit: Payer: Medicare Other | Admitting: Physical Therapy

## 2020-12-10 ENCOUNTER — Encounter: Payer: Self-pay | Admitting: Physical Therapy

## 2020-12-10 DIAGNOSIS — M542 Cervicalgia: Secondary | ICD-10-CM | POA: Diagnosis not present

## 2020-12-10 DIAGNOSIS — H8113 Benign paroxysmal vertigo, bilateral: Secondary | ICD-10-CM

## 2020-12-10 DIAGNOSIS — R42 Dizziness and giddiness: Secondary | ICD-10-CM

## 2020-12-10 NOTE — Therapy (Signed)
[place] [place], [place], [address] Phone: [phone]   Fax:  [phone]  Physical Therapy Treatment  Patient Details  Name: Renee [name] MRN: [id number] Date of Birth: 12-11-1945 Referring Provider (PT): Enedina Finner Date: 12/10/2020   PT End of Session - 12/10/20 0925    Visit Number 4    Date for PT Re-Evaluation 01/10/21    Authorization Type [employer] Medicare    PT Start Time 0845    PT Stop Time 0925    PT Time Calculation (min) 40 min    Activity Tolerance Patient tolerated treatment well    Behavior During Therapy [facility] for tasks assessed/performed;Anxious           Past Medical History:  Diagnosis Date  . Abdominal pain 07/05/2017  . ABNORMAL INVOLUNTARY MOVEMENTS 01/01/2009   Qualifier: Diagnosis of  By: Linna Darner MD, Gwyndolyn Saxon    . Allergic rhinitis   . Allergic rhinitis 09/19/2008   Qualifier: Diagnosis of  By: Wynona Luna   . Allergy   . Arthralgia of right wrist 05/06/2020  . Atypical chest pain 09/19/2008   Qualifier: Diagnosis of  By: Wynona Luna   . Cervical cancer screening 05/08/2013   Menarche 11, regular Menopause 38s G2P2 s/p 2 svd No abnormal paps or MGM Fatty cyst on labia removed.   . Cervical pain (neck) 10/07/2015  . CHEST PAIN 09/19/2008   Qualifier: Diagnosis of  By: Wynona Luna   . 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 Concussion 1969  . Diverticulosis 05/06/2017  . Essential hypertension 09/19/2008   Qualifier: Diagnosis of  By: Wynona Luna   . GERD (gastroesophageal reflux disease)   . H/O osteopenia 02/08/2016  . H/O tobacco use, presenting hazards to health 09/19/2008   Qualifier: Diagnosis of  By: Wynona Luna Smoking roughly 5 cig daily  Last cigarette was in December of 2016  . Head tremor 12/27/2016  . 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  Hx of adenomatous colonic polyps 01/27/2018  . Hyperglycemia 10/07/2016  . Hyperlipidemia   . Hyperlipidemia, mixed 10/03/2008   Qualifier: Diagnosis of  By: Wynona Luna   . Hypertension   . 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 Obesity 07/06/2016  . Occult blood positive stool   . Polyp of sigmoid colon 12/08/2016   11/2016 - Unable to remove or get bx at colonoscopy. Fixed sigmoid colon prevents. Referred for surgery.  Marland Kitchen PONV (postoperative nausea and vomiting)    ,   . PONV (postoperative nausea and vomiting)   . 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 Preventative health care 05/12/2013  . PVC's (premature ventricular contractions) 05/10/2017  . Rib pain on left side 11/17/2017  . Right chronic serous otitis media 05/21/2019  . Right hip pain 04/28/2020  . Shoulder pain 06/29/2018  . Strain of gluteus medius of right lower extremity 05/06/2020  . 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 Tobacco abuse   . Tremor, essential   . TREMOR, ESSENTIAL 09/19/2008   Qualifier: Diagnosis of  By: Wynona Luna   . Vertigo   . Vertigo     Past Surgical History:  Procedure  Laterality Date  . INNER EAR SURGERY  2015   with tube  . MOUTH SURGERY  01/22/11   lower denture secured by titanium bolts  . rectal suction biospy    . XI ROBOTIC ASSISTED LOWER ANTERIOR RESECTION N/A 02/09/2017   Procedure: XI ROBOTIC RESECTION OF SIGMOID COLON, RIGID PROCTOSCOPY;  Surgeon: Elizabethanne Lusher Boston, MD;  Location: [facility];  Service: General;  Laterality: N/A;    There were no vitals filed for this visit.   Subjective Assessment - 12/10/20 0923    Subjective Patient reports that overall she is feeling a little better iwth her neck tightness, the eye exercises andthe dizziness    Currently in Pain? Yes    Pain Score 1     Pain Location Neck    Pain Orientation Right    Pain Descriptors / Indicators Spasm    Pain Relieving Factors  the treatment here helps                             [facility] Adult PT Treatment/Exercise - 12/10/20 0001      High Level Balance   High Level Balance Activities Side stepping;Direction changes;Backward walking;Tandem walking    High Level Balance Comments airex tandem walk, side stepping, feet together eyes closed. airex ball toss, head turns, walking ball toss, we did the stairs in the clinic multiple times to have her feel like this is easy due to the anxiety of heights      Manual Therapy   Manual Therapy Soft tissue mobilization    Soft tissue mobilization to the right upper trap and the cervical area to a large knot                    PT Short Term Goals - 11/19/20 1141      PT SHORT TERM GOAL #1   Title independent with initial HEP    Status Partially Met             PT Long Term Goals - 12/10/20 0927      PT LONG TERM GOAL #1   Title independent with advanced HEP for VOR, possible BPPV and spasms    Status Achieved      PT LONG TERM GOAL #2   Title report decreased dizziness overall 25%    Status Partially Met      PT LONG TERM GOAL #3   Title report decreased neck pain 25%    Status Achieved      PT LONG TERM GOAL #4   Title increase cervical ROM 25%    Status Achieved                 Plan - 12/10/20 0925    Clinical Impression Statement Patient did not want to attempt the large stairs today, we talked about this as anxiety, we did the small stairs multiple times to assimilate to this, we worked on high level balance and talked about what to do from here, recommended the VOR as she feels that this is helping, also to keep going to [place] for balance, I did suggest that she walk on the track at the gym, she stopped this 2 years ago due to the anxiety of the height, I also sugested following up with ENT or neurologist.    PT Next Visit Plan d/c most goals met           Patient will benefit from skilled  therapeutic  intervention in order to improve the following deficits and impairments:  Decreased range of motion,Increased muscle spasms,Dizziness,Decreased balance,Pain,Postural dysfunction  Visit Diagnosis: Dizziness and giddiness  Cervicalgia  BPPV (benign paroxysmal positional vertigo), bilateral     Problem List Patient Active Problem List   Diagnosis Date Noted  . Plantar wart of both feet 10/26/2020  . Vertigo   . Tremor, essential   . PONV (postoperative nausea and vomiting)   . Occult blood positive stool   . Neuromuscular disorder ([place])   . Hypertension   . Allergy   . Arthralgia of right wrist 05/06/2020  . Strain of gluteus medius of right lower extremity 05/06/2020  . Right hip pain 04/28/2020  . Right chronic serous otitis media 05/21/2019  . Dermatitis 09/17/2018  . 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 Shoulder pain 06/29/2018  . Hx of adenomatous colonic polyps 01/27/2018  . Tinnitus 12/12/2017  . Rib pain on left side 11/17/2017  . Abdominal pain 07/05/2017  . PVC's (premature ventricular contractions) 05/10/2017  . Diverticulosis 05/06/2017  . 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 Head tremor 12/27/2016  . Polyp of sigmoid colon 12/08/2016  . Hyperglycemia 10/07/2016  . Obesity 07/06/2016  . GERD (gastroesophageal reflux disease) 07/06/2016  . H/O osteopenia 02/08/2016  . Cervical pain (neck) 10/07/2015  . Preventative health care 05/12/2013  . Cervical cancer screening 05/08/2013  . ABNORMAL INVOLUNTARY MOVEMENTS 01/01/2009  . Hyperlipidemia, mixed 10/03/2008  . H/O tobacco use, presenting hazards to health 09/19/2008  . Hyperlipidemia 09/19/2008  . 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 Essential hypertension 09/19/2008  . Allergic rhinitis 09/19/2008  . Atypical chest pain 09/19/2008  . Hearing loss in right ear 09/19/2008  . CHEST PAIN 09/19/2008  . Concussion 1969    Sumner Boast.,  PT 12/10/2020, 9:28 AM  [place]. [place], [place], [address] Phone: [phone]   Fax:  [phone]  Name: ARLETHIA BASSO MRN: [id number] Date of Birth: March 04, 1946

## 2020-12-30 ENCOUNTER — Ambulatory Visit: Payer: Medicare Other | Admitting: Podiatry

## 2021-02-11 ENCOUNTER — Ambulatory Visit: Payer: Medicare Other | Admitting: Podiatry

## 2021-04-02 ENCOUNTER — Ambulatory Visit: Payer: Medicare Other

## 2021-05-14 ENCOUNTER — Ambulatory Visit (INDEPENDENT_AMBULATORY_CARE_PROVIDER_SITE_OTHER): Payer: Medicare Other | Admitting: Family Medicine

## 2021-05-14 ENCOUNTER — Encounter: Payer: Self-pay | Admitting: Family Medicine

## 2021-05-14 ENCOUNTER — Other Ambulatory Visit: Payer: Self-pay

## 2021-05-14 VITALS — BP 132/76 | HR 79 | Temp 98.1°F | Resp 12 | Ht 66.0 in | Wt 211.0 lb

## 2021-05-14 DIAGNOSIS — E2839 Other primary ovarian failure: Secondary | ICD-10-CM

## 2021-05-14 DIAGNOSIS — M8580 Other specified disorders of bone density and structure, unspecified site: Secondary | ICD-10-CM

## 2021-05-14 DIAGNOSIS — Z860101 Personal history of adenomatous and serrated colon polyps: Secondary | ICD-10-CM

## 2021-05-14 DIAGNOSIS — Z780 Asymptomatic menopausal state: Secondary | ICD-10-CM

## 2021-05-14 DIAGNOSIS — R109 Unspecified abdominal pain: Secondary | ICD-10-CM | POA: Diagnosis not present

## 2021-05-14 DIAGNOSIS — Z0000 Encounter for general adult medical examination without abnormal findings: Secondary | ICD-10-CM

## 2021-05-14 DIAGNOSIS — Z1239 Encounter for other screening for malignant neoplasm of breast: Secondary | ICD-10-CM | POA: Diagnosis not present

## 2021-05-14 DIAGNOSIS — E785 Hyperlipidemia, unspecified: Secondary | ICD-10-CM | POA: Diagnosis not present

## 2021-05-14 DIAGNOSIS — Z86010 Personal history of colonic polyps: Secondary | ICD-10-CM

## 2021-05-14 DIAGNOSIS — R739 Hyperglycemia, unspecified: Secondary | ICD-10-CM

## 2021-05-14 DIAGNOSIS — H7191 Unspecified cholesteatoma, right ear: Secondary | ICD-10-CM

## 2021-05-14 DIAGNOSIS — R194 Change in bowel habit: Secondary | ICD-10-CM

## 2021-05-14 DIAGNOSIS — R0789 Other chest pain: Secondary | ICD-10-CM

## 2021-05-14 DIAGNOSIS — I10 Essential (primary) hypertension: Secondary | ICD-10-CM

## 2021-05-14 DIAGNOSIS — G709 Myoneural disorder, unspecified: Secondary | ICD-10-CM

## 2021-05-14 HISTORY — DX: Other specified disorders of bone density and structure, unspecified site: M85.80

## 2021-05-14 HISTORY — DX: Change in bowel habit: R19.4

## 2021-05-14 LAB — COMPREHENSIVE METABOLIC PANEL
ALT: 23 U/L (ref 0–35)
AST: 19 U/L (ref 0–37)
Albumin: 4.3 g/dL (ref 3.5–5.2)
Alkaline Phosphatase: 85 U/L (ref 39–117)
BUN: 12 mg/dL (ref 6–23)
CO2: 25 mEq/L (ref 19–32)
Calcium: 9.2 mg/dL (ref 8.4–10.5)
Chloride: 104 mEq/L (ref 96–112)
Creatinine, Ser: 0.77 mg/dL (ref 0.40–1.20)
GFR: 75.53 mL/min (ref >60.00)
Glucose, Bld: 108 mg/dL — ABNORMAL HIGH (ref 70–99)
Potassium: 4.4 mEq/L (ref 3.5–5.1)
Sodium: 138 mEq/L (ref 135–145)
Total Bilirubin: 0.8 mg/dL (ref 0.2–1.2)
Total Protein: 6.9 g/dL (ref 6.0–8.3)

## 2021-05-14 LAB — LIPID PANEL
Cholesterol: 234 mg/dL — ABNORMAL HIGH (ref 0–200)
HDL: 66.9 mg/dL (ref >39.00)
LDL Cholesterol: 143 mg/dL — ABNORMAL HIGH (ref 0–99)
NonHDL: 166.92
Total CHOL/HDL Ratio: 3
Triglycerides: 118 mg/dL (ref 0.0–149.0)
VLDL: 23.6 mg/dL (ref 0.0–40.0)

## 2021-05-14 LAB — CBC
HCT: 46.3 % — ABNORMAL HIGH (ref 36.0–46.0)
Hemoglobin: 15 g/dL (ref 12.0–15.0)
MCHC: 32.3 g/dL (ref 30.0–36.0)
MCV: 102.4 fl — ABNORMAL HIGH (ref 78.0–100.0)
Platelets: 245 10*3/uL (ref 150.0–400.0)
RBC: 4.52 Mil/uL (ref 3.87–5.11)
RDW: 14.9 % (ref 11.5–15.5)
WBC: 6.1 10*3/uL (ref 4.0–10.5)

## 2021-05-14 LAB — HEMOGLOBIN A1C: Hgb A1c MFr Bld: 5.9 % (ref 4.6–6.5)

## 2021-05-14 LAB — TSH: TSH: 1.51 u[IU]/mL (ref 0.35–5.50)

## 2021-05-14 NOTE — Assessment & Plan Note (Signed)
Encourage heart healthy diet such as MIND or DASH diet, increase exercise, avoid trans fats, simple carbohydrates and processed foods, consider a krill or fish or flaxseed oil cap daily.  °

## 2021-05-14 NOTE — Assessment & Plan Note (Signed)
Encouraged to get adequate exercise, calcium and vitamin d intake 

## 2021-05-14 NOTE — Assessment & Plan Note (Signed)
Patient encouraged to maintain heart healthy diet, regular exercise, adequate sleep. Consider daily probiotics. Take medications as prescribed. Labs ordered and reviewed. Sees Dr Susie Cassette of dermatology next month. Encouraged to take shingrix, flu and COVID booster she is not prepared to take today. Dexa and MGM ordered today for December.

## 2021-05-14 NOTE — Patient Instructions (Addendum)
Paxlovid or Molnupiravir are the new COVID medication we can give you if you get COVID so make sure you test if you have symptoms because we have to treat by day 5 of symptoms for it to be effective. If you are positive let us know so we can treat. If a home test is negative and your symptoms are persistent get a PCR test. Can check testing locations at [email or web address] If you are positive we will make an appointment with [place] and we will send in Paxlovid or Molnupiravir if you would like it. Check with your pharmacy before we meet to confirm they have it in stock, if they do not then we can get the prescription at the [place] 65 Years and Older, Female Preventive care refers to lifestyle choices and visits with your health care provider that can promote health and wellness. This includes: A yearly physical exam. This is also called an annual wellness visit. Regular dental and eye exams. Immunizations. Screening for certain conditions. Healthy lifestyle choices, such as: Eating a healthy diet. Getting regular exercise. Not using drugs or products that contain nicotine and tobacco. Limiting alcohol use. What can I expect for my preventive care visit? Physical exam Your health care provider will check your: Height and weight. These may be used to calculate your BMI (body mass index). BMI is a measurement that tells if you are at a healthy weight. Heart rate and blood pressure. Body temperature. Skin for abnormal spots. Counseling Your health care provider may ask you questions about your: Past medical problems. Family's medical history. Alcohol, tobacco, and drug use. Emotional well-being. Home life and relationship well-being. Sexual activity. Diet, exercise, and sleep habits. History of falls. Memory and ability to understand (cognition). Work and work Statistician. Pregnancy and menstrual history. Access to firearms. What immunizations do I  need? Vaccines are usually given at various ages, according to a schedule. Your health care provider will recommend vaccines for you based on your age, medical history, and lifestyle or other factors, such as travel or where you work. What tests do I need? Blood tests Lipid and cholesterol levels. These may be checked every 5 years, or more often depending on your overall health. Hepatitis C test. Hepatitis B test. Screening Lung cancer screening. You may have this screening every year starting at age 91 if you have a 30-pack-year history of smoking and currently smoke or have quit within the past 15 years. Colorectal cancer screening. All adults should have this screening starting at age 47 and continuing until age 31. Your health care provider may recommend screening at age 47 if you are at increased risk. You will have tests every 1-10 years, depending on your results and the type of screening test. Diabetes screening. This is done by checking your blood sugar (glucose) after you have not eaten for a while (fasting). You may have this done every 1-3 years. Mammogram. This may be done every 1-2 years. Talk with your health care provider about how often you should have regular mammograms. Abdominal aortic aneurysm (AAA) screening. You may need this if you are a current or former smoker. BRCA-related cancer screening. This may be done if you have a family history of breast, ovarian, tubal, or peritoneal cancers. Other tests STD (sexually transmitted disease) testing, if you are at risk. Bone density scan. This is done to screen for osteoporosis. You may have this done starting at age 48. Talk with your health care  provider about your test results, treatment options, and if necessary, the need for more tests. Follow these instructions at home: Eating and drinking  Eat a diet that includes fresh fruits and vegetables, whole grains, lean protein, and low-fat dairy products. Limit your intake  of foods with high amounts of sugar, saturated fats, and salt. Take vitamin and mineral supplements as recommended by your health care provider. Do not drink alcohol if your health care provider tells you not to drink. If you drink alcohol: Limit how much you have to 0-1 drink a day. Be aware of how much alcohol is in your drink. In the [place] one drink equals one 12 oz bottle of beer (355 mL), one 5 oz glass of wine (148 mL), or one 1 oz glass of hard liquor (44 mL). Lifestyle Take daily care of your teeth and gums. Brush your teeth every morning and night with fluoride toothpaste. Floss one time each day. Stay active. Exercise for at least 30 minutes 5 or more days each week. Do not use any products that contain nicotine or tobacco, such as cigarettes, e-cigarettes, and chewing tobacco. If you need help quitting, ask your health care provider. Do not use drugs. If you are sexually active, practice safe sex. Use a condom or other form of protection in order to prevent STIs (sexually transmitted infections). Talk with your health care provider about taking a low-dose aspirin or statin. Find healthy ways to cope with stress, such as: Meditation, yoga, or listening to music. Journaling. Talking to a trusted person. Spending time with friends and family. Safety Always wear your seat belt while driving or riding in a vehicle. Do not drive: If you have been drinking alcohol. Do not ride with someone who has been drinking. When you are tired or distracted. While texting. Wear a helmet and other protective equipment during sports activities. If you have firearms in your house, make sure you follow all gun safety procedures. What's next? Visit your health care provider once a year for an annual wellness visit. Ask your health care provider how often you should have your eyes and teeth checked. Stay up to date on all vaccines. This information is not intended to replace advice given to you by  your health care provider. Make sure you discuss any questions you have with your health care provider. Document Revised: 10/17/2020 Document Reviewed: 08/03/2018 Elsevier Patient Education  2022 [employer]. if you would like it. Check with your pharmacy before we meet to confirm they have it in stock, if they do not then we can get the prescription at the [facility]

## 2021-05-14 NOTE — Assessment & Plan Note (Signed)
She notes alternating between constipation and diarrhea, is noting increased epigastric bloating and pain. Will refer back to GI for further consideration. She is encouraged to add a fiber supplement bid and attempt modest weight loss

## 2021-05-14 NOTE — Assessment & Plan Note (Signed)
Well controlled, no changes to meds. Encouraged heart healthy diet such as the DASH diet and exercise as tolerated.  °

## 2021-05-14 NOTE — Progress Notes (Signed)
Patient ID: Renee [name], female    DOB: 05-21-1946  Age: 75 y.o. MRN: [id number]    Subjective:   Chief Complaint  Patient presents with   Annual Exam   Subjective  HPI Renee [name] presents for office visit today for comprehensive physical exam today and follow up on management of chronic concerns. She reports that she is having trouble with her left/right ear. She has been feeling dizzy (woozy), sinus pressure, and off balance. Sometimes when bad enough she has to use a cane for walking. She has had one fall, but it is not attributed to her balance issues. Onset was about a year ago and her condition has been progressively getting worse. She cannot hear from her right ear and only depends on her left ear. She sees Dr. Erling Cruz at [facility]. Denies CP/palp/SOB/HA/congestion/fevers or GU c/o. Taking meds as prescribed. She has complaints of bloating  in abdomin and abdominal pain. But no bloody or sticky stool. She experiences hot flashes every day that started 6 months ago. Does tai chi 3x a week.   Review of Systems  Constitutional:  Negative for chills, fatigue and fever.  HENT:  Positive for hearing loss and sinus pressure. Negative for congestion, rhinorrhea, sinus pain, sore throat, tinnitus and trouble swallowing.   Eyes:  Negative for pain.  Respiratory:  Negative for cough and shortness of breath.   Cardiovascular:  Negative for chest pain, palpitations and leg swelling.  Gastrointestinal:  Positive for abdominal distention and abdominal pain. Negative for blood in stool, diarrhea, nausea and vomiting.  Genitourinary:  Negative for decreased urine volume, flank pain, frequency, vaginal bleeding and vaginal discharge.  Musculoskeletal:  Negative for back pain.  Neurological:  Positive for dizziness and tremors. Negative for headaches.   History Past Medical History:  Diagnosis Date   Abdominal pain 07/05/2017   ABNORMAL INVOLUNTARY MOVEMENTS 01/01/2009   Qualifier:  Diagnosis of  By: Linna Darner MD, William     Allergic rhinitis    Allergic rhinitis 09/19/2008   Qualifier: Diagnosis of  By: Wynona Luna    Allergy    Arthralgia of right wrist 05/06/2020   Atypical chest pain 09/19/2008   Qualifier: Diagnosis of  By: Wynona Luna    Cervical cancer screening 05/08/2013   Menarche 11, regular Menopause 54s G2P2 s/p 2 svd No abnormal paps or MGM Fatty cyst on labia removed.    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   Qualifier: Diagnosis of  By: Yoo DO, [place]   Diverticulosis 05/06/2017   Essential hypertension 09/19/2008   Qualifier: Diagnosis of  By: Wynona Luna    GERD (gastroesophageal reflux disease)    H/O osteopenia 02/08/2016   H/O tobacco use, presenting hazards to health 09/19/2008   Qualifier: Diagnosis of  By: Wynona Luna Smoking roughly 5 cig daily  Last cigarette was in December of 2016   Head tremor 12/27/2016   Hearing loss in right ear 09/19/2008   Qualifier: Diagnosis of  By: Wynona Luna    Hx of adenomatous colonic polyps 01/27/2018   Hyperglycemia 10/07/2016   Hyperlipidemia    Hyperlipidemia, mixed 10/03/2008   Qualifier: Diagnosis of  By: Wynona Luna    Hypertension    Neuromuscular disorder ([place])    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 Fixed sigmoid colon prevents. Referred for surgery.   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   Her family history includes Alcohol abuse in her mother; Heart attack (age of onset: 66) in her father; Heart disease in her paternal grandfather and paternal grandmother; Hypertension in her mother; Obesity in her daughter; Stroke in her mother.She reports that she quit smoking about 5 years ago. Her smoking use included cigarettes. She smoked an average of .5 packs per day. She has never used smokeless tobacco. She reports current alcohol use. She reports that she does not use drugs.  Current Outpatient Medications on File Prior to Visit  Medication Sig Dispense Refill   atorvastatin (LIPITOR) 10 MG tablet TAKE 1/2 TABLET (5 MG TOTAL) BY MOUTH EVERY OTHER DAY. 30 tablet 2   b complex vitamins tablet Take 1 tablet by mouth every other day.     Coenzyme Q10 200 MG capsule Take 200 mg by mouth once a week.      famotidine (PEPCID) 20 MG tablet TAKE 1 TABLET BY MOUTH TWICE A DAY (Patient taking differently: Take 20 mg by mouth daily as needed.) 180 tablet 1   metoprolol succinate (TOPROL-XL) 25 MG 24 hr tablet Take 0.5 tablets (12.5 mg total) by mouth daily. 45 tablet 3   Multiple Vitamin (MULTIVITAMIN WITH MINERALS) TABS tablet Take 1 tablet by mouth every other day.     No current facility-administered medications on file prior to visit.     Objective:  Objective  Physical Exam Constitutional:      General: She is not in acute distress.    Appearance: Normal appearance. She is not ill-appearing or toxic-appearing.   HENT:     Head: Normocephalic and atraumatic.     Right Ear: Tympanic membrane, ear canal and external ear normal.     Left Ear: Tympanic membrane, ear canal and external ear normal.     Nose: No congestion or rhinorrhea.  Eyes:     Extraocular Movements: Extraocular movements intact.     Pupils: Pupils are equal, round, and reactive to light.  Cardiovascular:     Rate and Rhythm: Normal rate and regular rhythm.     Pulses: Normal pulses.     Heart sounds: Normal heart sounds. No murmur heard. Pulmonary:     Effort: Pulmonary effort is normal. No respiratory distress.     Breath sounds: Normal breath sounds. No wheezing, rhonchi or rales.  Abdominal:  General: Bowel sounds are normal.     Palpations: Abdomen is soft. There is no mass.     Tenderness: There is no abdominal tenderness. There is no guarding.     Hernia: No hernia is present.  Musculoskeletal:        General: Normal range of motion.     Cervical back: Normal range of motion and neck supple.  Skin:    General: Skin is warm and dry.  Neurological:     Mental Status: She is alert and oriented to person, place, and time.     Cranial Nerves: No facial asymmetry.     Motor: Motor function is intact. No weakness.  Psychiatric:        Behavior: Behavior normal.   BP 132/76 (BP Location: Left Arm, Patient Position: Sitting)   Pulse 79   Temp 98.1 F (36.7 C) (Oral)   Resp 12   Ht 5' 6"  (1.676 m)   Wt 211 lb (95.7 kg)   SpO2 95%   BMI 34.06 kg/m  Wt Readings from Last 3 Encounters:  05/14/21 211 lb (95.7 kg)  10/23/20 213 lb (96.6 kg)  07/02/20 212 lb 6.4 oz (96.3 kg)     Lab Results  Component Value Date   WBC 6.3 10/23/2020   HGB 15.4 (H) 10/23/2020   HCT 45.3 10/23/2020   PLT 274.0 10/23/2020   GLUCOSE 101 (H) 10/23/2020   CHOL 238 (H) 10/23/2020   TRIG 112.0 10/23/2020   HDL 69.10 10/23/2020   LDLDIRECT 148.6 09/26/2008   LDLCALC 146 (H) 10/23/2020   ALT 27 10/23/2020   AST 20 10/23/2020   NA  136 10/23/2020   K 4.7 10/23/2020   CL 103 10/23/2020   CREATININE 0.78 10/23/2020   BUN 17 10/23/2020   CO2 26 10/23/2020   TSH 1.32 10/23/2020   HGBA1C 5.9 10/23/2020    DG ESOPHAGUS W DOUBLE CM (HD)  Result Date: 05/12/2020 CLINICAL DATA:  Gastroesophageal reflux disease.  Dysphagia. EXAM: UPPER GI SERIES WITH KUB TECHNIQUE: After obtaining a scout radiograph a routine upper GI series was performed using thin and high density barium. FLUOROSCOPY TIME:  Fluoroscopy Time:  1 minutes 12 second Radiation Exposure Index (if provided by the fluoroscopic device): Number of Acquired Spot Images: 0 COMPARISON:  None. FINDINGS: Esophageal mucosa and motility normal.  No stricture or mass. Small hiatal hernia with mild gastroesophageal reflux disease Barium tablet passed readily into the stomach without delay. IMPRESSION: Small hiatal hernia with mild gastroesophageal reflux. Negative for stricture. Electronically Signed   By: Franchot Gallo M.D.   On: 05/12/2020 10:53     Assessment & Plan:  Plan    No orders of the defined types were placed in this encounter.   Problem List Items Addressed This Visit     Hyperlipidemia - Primary    Encourage heart healthy diet such as MIND or DASH diet, increase exercise, avoid trans fats, simple carbohydrates and processed foods, consider a krill or fish or flaxseed oil cap daily.       Relevant Orders   Lipid panel   Cholesteatoma of right ear    Had surgery in 2014 at [facility], he has retired       Essential hypertension    Well controlled, no changes to meds. Encouraged heart healthy diet such as the DASH diet and exercise as tolerated.       Relevant Orders   CBC   Comprehensive metabolic panel   TSH  Atypical chest pain   Preventative health care    Patient encouraged to maintain heart healthy diet, regular exercise, adequate sleep. Consider daily probiotics. Take medications as prescribed. Labs ordered and reviewed. Sees Dr Susie Cassette of  dermatology next month. Encouraged to take shingrix, flu and COVID booster she is not prepared to take today. Dexa and MGM ordered today for December.       Hyperglycemia    hgba1c acceptable, minimize simple carbs. Increase exercise as tolerated.       Relevant Orders   Hemoglobin A1c   Abdominal pain   Relevant Orders   DG Abd 2 Views   Hx of adenomatous colonic polyps    Last colonoscopy 2019 repeat in 2024      Neuromuscular disorder ([place])    Long standing but now with increasing sense of disequilibrium, hearing loss. Referred to [facility] neurology for evaluation. She has an appt with [facility] soon.      Hypertension    Well controlled, no changes to meds. Encouraged heart healthy diet such as the DASH diet and exercise as tolerated.       Osteopenia    Encouraged to get adequate exercise, calcium and vitamin d intake      Relevant Orders   DG Bone Density   Change in bowel habits    She notes alternating between constipation and diarrhea, is noting increased epigastric bloating and pain. Will refer back to GI for further consideration. She is encouraged to add a fiber supplement bid and attempt modest weight loss      Relevant Orders   Ambulatory referral to Gastroenterology   DG Abd 2 Views   Other Visit Diagnoses     Estrogen deficiency       Relevant Orders   DG Bone Density   Post-menopausal       Relevant Orders   DG Bone Density   Encounter for screening for malignant neoplasm of breast, unspecified screening modality       Relevant Orders   MM 3D SCREEN BREAST BILATERAL       Follow-up: Return in about 4 months (around 09/13/2021).  I, Suezanne Jacquet, acting as a scribe for Penni Homans, MD, have documented all relevent documentation on behalf of Penni Homans, MD, as directed by Penni Homans, MD while in the presence of Penni Homans, MD. DO:05/14/21.  I, [name], MD personally performed the services described in this documentation. All medical  record entries made by the scribe were at my direction and in my presence. I have reviewed the chart and agree that the record reflects my personal performance and is accurate and complete

## 2021-05-14 NOTE — Assessment & Plan Note (Signed)
Last colonoscopy 2019 repeat in 2024

## 2021-05-14 NOTE — Assessment & Plan Note (Signed)
hgba1c acceptable, minimize simple carbs. Increase exercise as tolerated.  

## 2021-05-14 NOTE — Assessment & Plan Note (Addendum)
Long standing but now with increasing sense of disequilibrium, hearing loss. Referred to [facility] neurology for evaluation. She has an appt with [facility] soon.

## 2021-05-14 NOTE — Assessment & Plan Note (Signed)
Had surgery in 2014 at [facility] audiology, he has retired

## 2021-05-15 ENCOUNTER — Other Ambulatory Visit: Payer: Self-pay

## 2021-05-15 MED ORDER — ATORVASTATIN CALCIUM 10 MG PO TABS
5.0000 mg | ORAL_TABLET | Freq: Every day | ORAL | 2 refills | Status: DC
Start: 2021-05-15 — End: 2021-07-14

## 2021-05-18 DIAGNOSIS — L218 Other seborrheic dermatitis: Secondary | ICD-10-CM | POA: Diagnosis not present

## 2021-05-18 DIAGNOSIS — L820 Inflamed seborrheic keratosis: Secondary | ICD-10-CM | POA: Diagnosis not present

## 2021-05-18 DIAGNOSIS — L700 Acne vulgaris: Secondary | ICD-10-CM | POA: Diagnosis not present

## 2021-05-18 DIAGNOSIS — L814 Other melanin hyperpigmentation: Secondary | ICD-10-CM | POA: Diagnosis not present

## 2021-05-18 DIAGNOSIS — X32XXXS Exposure to sunlight, sequela: Secondary | ICD-10-CM | POA: Diagnosis not present

## 2021-06-25 ENCOUNTER — Other Ambulatory Visit: Payer: Self-pay | Admitting: Nurse Practitioner

## 2021-06-25 DIAGNOSIS — R1310 Dysphagia, unspecified: Secondary | ICD-10-CM

## 2021-06-25 DIAGNOSIS — K2289 Other specified disease of esophagus: Secondary | ICD-10-CM

## 2021-06-25 DIAGNOSIS — K219 Gastro-esophageal reflux disease without esophagitis: Secondary | ICD-10-CM

## 2021-06-26 DIAGNOSIS — H2513 Age-related nuclear cataract, bilateral: Secondary | ICD-10-CM | POA: Diagnosis not present

## 2021-06-26 DIAGNOSIS — H43813 Vitreous degeneration, bilateral: Secondary | ICD-10-CM | POA: Diagnosis not present

## 2021-07-06 ENCOUNTER — Ambulatory Visit: Payer: Medicare Other | Admitting: Family Medicine

## 2021-07-06 ENCOUNTER — Encounter: Payer: Self-pay | Admitting: Family Medicine

## 2021-07-06 VITALS — BP 144/110 | Ht 66.0 in | Wt 211.0 lb

## 2021-07-06 DIAGNOSIS — M5416 Radiculopathy, lumbar region: Secondary | ICD-10-CM

## 2021-07-06 HISTORY — DX: Radiculopathy, lumbar region: M54.16

## 2021-07-06 MED ORDER — PREDNISONE 5 MG PO TABS: ORAL_TABLET | ORAL | 0 refills | Status: DC

## 2021-07-06 NOTE — Assessment & Plan Note (Signed)
Acute in nature. Most consistent with radicular pain. May have component of piriformis  - counseled on home exercise therapy and supportive care - prednisone  - could consider imaging or physical therapy

## 2021-07-06 NOTE — Progress Notes (Signed)
[name] - 75 y.o. female MRN [id number]  Date of birth: Aug 27, 1945  SUBJECTIVE:  Including CC & ROS.  No chief complaint on file.   Renee [name] is a 75 y.o. female that is  presenting with acute right leg and buttock pain. No injury or inciting event. Has gotten improvement with tylenol   Review of Systems See HPI   HISTORY: Past Medical, Surgical, Social, and Family History Reviewed & Updated per EMR.   Pertinent Historical Findings include:  Past Medical History:  Diagnosis Date   Abdominal pain 07/05/2017   ABNORMAL INVOLUNTARY MOVEMENTS 01/01/2009   Qualifier: Diagnosis of  By: Linna Darner MD, [place]     Allergic rhinitis    Allergic rhinitis 09/19/2008   Qualifier: Diagnosis of  By: Wynona Luna    Allergy    Arthralgia of right wrist 05/06/2020   Atypical chest pain 09/19/2008   Qualifier: Diagnosis of  By: Wynona Luna    Cervical cancer screening 05/08/2013   Menarche 11, regular Menopause 74s G2P2 s/p 2 svd No abnormal paps or MGM Fatty cyst on labia removed.    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   Qualifier: Diagnosis of  By: Yoo DO, [place]   Diverticulosis 05/06/2017   Essential hypertension 09/19/2008   Qualifier: Diagnosis of  By: Wynona Luna    GERD (gastroesophageal reflux disease)    H/O osteopenia 02/08/2016   H/O tobacco use, presenting hazards to health 09/19/2008   Qualifier: Diagnosis of  By: Wynona Luna Smoking roughly 5 cig daily  Last cigarette was in December of 2016   Head tremor 12/27/2016   Hearing loss in right ear 09/19/2008   Qualifier: Diagnosis of  By: Wynona Luna    Hx of adenomatous colonic polyps 01/27/2018   Hyperglycemia 10/07/2016   Hyperlipidemia    Hyperlipidemia, mixed 10/03/2008   Qualifier: Diagnosis of  By: Wynona Luna    Hypertension    Neuromuscular disorder [facility])     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 Fixed sigmoid colon prevents. Referred for surgery.   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 02/09/2017   Tobacco abuse    Tremor, essential    TREMOR, ESSENTIAL 09/19/2008   Qualifier: Diagnosis of  By: Wynona Luna    Vertigo    Vertigo     Past Surgical History:  Procedure Laterality Date   INNER EAR SURGERY  2015   with tube   MOUTH SURGERY  01/22/11   lower denture secured by titanium bolts   rectal suction biospy     XI ROBOTIC ASSISTED LOWER ANTERIOR RESECTION N/A 02/09/2017  Procedure: XI ROBOTIC RESECTION OF SIGMOID COLON, RIGID PROCTOSCOPY;  Surgeon: Michael Boston, MD;  Location: [facility];  Service: General;  Laterality: N/A;    Family History  Problem Relation Age of Onset   Heart attack Father 55       deceased   Stroke Mother    Hypertension Mother    Alcohol abuse Mother    Obesity Daughter    Heart disease Paternal Grandmother        AAA rupture   Heart disease Paternal Grandfather        MI   Colon cancer Neg Hx    Breast cancer Neg Hx    Prostate cancer Neg Hx    Diabetes Neg Hx    Liver cancer Neg Hx    Rectal cancer Neg Hx    Stomach cancer Neg Hx     Social History   Socioeconomic History   Marital status: Married    Spouse name: Not on file   Number of children: 2   Years of education: Not on file   Highest education  level: Not on file  Occupational History   Occupation: retired  Tobacco Use   Smoking status: Former    Packs/day: 0.50    Types: Cigarettes    Quit date: 09/07/2015    Years since quitting: 5.8   Smokeless tobacco: Never  Vaping Use   Vaping Use: Never used  Substance and Sexual Activity   Alcohol use: Yes   Drug use: No   Sexual activity: Yes  Other Topics Concern   Not on file  Social History Narrative   Not on file   Social Determinants of Health   Financial Resource Strain: Not on file  Food Insecurity: Not on file  Transportation Needs: Not on file  Physical Activity: Not on file  Stress: Not on file  Social Connections: Not on file  Intimate Partner Violence: Not on file     PHYSICAL EXAM:  VS: BP (!) 144/110 (BP Location: Left Arm, Patient Position: Sitting)   Ht 5\' 6"  (1.676 m)   Wt 211 lb (95.7 kg)   BMI 34.06 kg/m  Physical Exam Gen: NAD, alert, cooperative with exam, well-appearing    ASSESSMENT & PLAN:   Lumbar radiculopathy Acute in nature. Most consistent with radicular pain. May have component of piriformis  - counseled on home exercise therapy and supportive care - prednisone  - could consider imaging or physical therapy

## 2021-07-06 NOTE — Patient Instructions (Signed)
Good to see you Please try heat  Please try the exercises  Please continue the tylenol   Please send me a message in MyChart with any questions or updates.  Please see me back in 1-2 weeks.   --Dr. Raeford Razor

## 2021-07-08 ENCOUNTER — Ambulatory Visit: Payer: Medicare Other | Admitting: Family Medicine

## 2021-07-09 ENCOUNTER — Other Ambulatory Visit (HOSPITAL_BASED_OUTPATIENT_CLINIC_OR_DEPARTMENT_OTHER): Payer: Self-pay | Admitting: Family Medicine

## 2021-07-09 DIAGNOSIS — M8580 Other specified disorders of bone density and structure, unspecified site: Secondary | ICD-10-CM

## 2021-07-09 DIAGNOSIS — E2839 Other primary ovarian failure: Secondary | ICD-10-CM

## 2021-07-09 DIAGNOSIS — Z780 Asymptomatic menopausal state: Secondary | ICD-10-CM

## 2021-07-14 ENCOUNTER — Other Ambulatory Visit: Payer: Self-pay | Admitting: Family Medicine

## 2021-07-14 ENCOUNTER — Ambulatory Visit (HOSPITAL_BASED_OUTPATIENT_CLINIC_OR_DEPARTMENT_OTHER): Payer: Medicare Other

## 2021-07-14 ENCOUNTER — Other Ambulatory Visit (HOSPITAL_BASED_OUTPATIENT_CLINIC_OR_DEPARTMENT_OTHER): Payer: Medicare Other

## 2021-07-15 DIAGNOSIS — H90A31 Mixed conductive and sensorineural hearing loss, unilateral, right ear with restricted hearing on the contralateral side: Secondary | ICD-10-CM | POA: Diagnosis not present

## 2021-07-20 ENCOUNTER — Ambulatory Visit: Payer: Medicare Other | Admitting: Family Medicine

## 2021-07-23 ENCOUNTER — Ambulatory Visit: Payer: Medicare Other | Admitting: Internal Medicine

## 2021-07-23 ENCOUNTER — Encounter: Payer: Self-pay | Admitting: Internal Medicine

## 2021-07-23 VITALS — BP 128/70 | HR 83 | Ht 66.0 in | Wt 211.0 lb

## 2021-07-23 DIAGNOSIS — E8881 Metabolic syndrome: Secondary | ICD-10-CM | POA: Diagnosis not present

## 2021-07-23 DIAGNOSIS — E669 Obesity, unspecified: Secondary | ICD-10-CM

## 2021-07-23 DIAGNOSIS — K219 Gastro-esophageal reflux disease without esophagitis: Secondary | ICD-10-CM | POA: Diagnosis not present

## 2021-07-23 DIAGNOSIS — R140 Abdominal distension (gaseous): Secondary | ICD-10-CM

## 2021-07-23 DIAGNOSIS — K760 Fatty (change of) liver, not elsewhere classified: Secondary | ICD-10-CM | POA: Diagnosis not present

## 2021-07-23 DIAGNOSIS — Z9889 Other specified postprocedural states: Secondary | ICD-10-CM | POA: Diagnosis not present

## 2021-07-23 NOTE — Patient Instructions (Signed)
If you are age 75 or older, your body mass index should be between 23-30. Your Body mass index is 34.06 kg/m. If this is out of the aforementioned range listed, please consider follow up with your Primary Care Provider.  If you are age 68 or younger, your body mass index should be between 19-25. Your Body mass index is 34.06 kg/m. If this is out of the aformentioned range listed, please consider follow up with your Primary Care Provider.   ________________________________________________________  The [place] [facility] providers would like to encourage you to use [facility] to communicate with providers for non-urgent requests or questions.  Due to long hold times on the telephone, sending your provider a message by [facility] may be a faster and more efficient way to get a response.  Please allow 48 business hours for a response.  Please remember that this is for non-urgent requests.  _______________________________________________________  If you have problems again please come and get an abdominal x-ray in the basement of our building. No appointment needed. They are open 8:30am-5:00pm and closed for lunch from 12:30-1:00pm for lunch.  I appreciate the opportunity to care for you. Silvano [name], MD, [facility]

## 2021-07-23 NOTE — Progress Notes (Signed)
Renee [name] 75 y.o. Sep 14, 1945 [id number]  Assessment & Plan:   Encounter Diagnoses  Name Primary?   Abdominal distension Yes   History of colon surgery    Gastroesophageal reflux disease, unspecified whether esophagitis present    Abdominal obesity and metabolic syndrome    NAFLD (nonalcoholic fatty liver disease)   The abdominal distention episodes sound like a possible partial bowel obstruction.   Orders Placed This Encounter  Procedures   DG Abd 2 Views  To come in for xrays if this recurs order is good for 1 year.  Explained she can come in during the week when we are here and get an x-ray if this occurs.  If she has severe problems with vomiting etc. she needs to go to an emergency room.  Changing eating habits for weight loss and metabolic improvement- lower carb, consider intermittent fasting - reviewed with patient. Handout provided. Read the Obesity Code.  Try to be more active.  [facility]   CC: Renee Lukes, MD   Subjective:   Chief Complaint: Bloating, abdominal distention  HPI The patient is a 75 year old woman status post segmental resection of a colon polyp and stricture in 2019 who has had 2 or 3 episodes of severe abdominal distention and discomfort in the past 6 months it comes on without clear trigger that lasts for 1 to 2 days.  She does not have nausea or vomiting but she does not pass flatus during these episodes.  Bowel habits are overall regular.  The last episode was a couple of months ago when she called for an appointment, she almost canceled but decided to come in.  She does not recall having these episodes prior to her abdominal surgery in 2019.  Otherwise she reports weight gain since the pandemic began, reduced activity as well.  Famotidine 20 mg at bedtime is controlling reflux symptoms, she had seen Renee [name] in 2021 about this.  Ultrasound in 2020 consistent with hepatic steatosis   Wt Readings from Last 3  Encounters:  07/23/21 211 lb (95.7 kg)  07/06/21 211 lb (95.7 kg)  05/14/21 211 lb (95.7 kg)  193 pounds in 2018   Allergies  Allergen Reactions   Amoxicillin     REACTION: TROUBLE BREATHING Has patient had a PCN reaction causing immediate rash, facial/tongue/throat swelling, SOB or lightheadedness with hypotension:Yes Has patient had a PCN reaction causing severe rash involving mucus membranes or skin necrosis:No Has patient had a PCN reaction that required hospitalization:No Has patient had a PCN reaction occurring within the last 10 years:No If all of the above answers are "NO", then may proceed with Cephalosporin use.    Proair Hfa [Albuterol] Other (See Comments)    Hypersensitivity, anxious, disoriented   Penicillins Rash    Has patient had a PCN reaction causing immediate rash, facial/tongue/throat swelling, SOB or lightheadedness with hypotension:unknown Has patient had a PCN reaction causing severe rash involving mucus membranes or skin necrosis:unknown Has patient had a PCN reaction that required hospitalization:no Has patient had a PCN reaction occurring within the last 10 years:no If all of the above answers are "NO", then may proceed with Cephalosporin use.    Current Meds  Medication Sig   atorvastatin (LIPITOR) 10 MG tablet TAKE 1/2 TABLET (5 MG TOTAL) BY MOUTH EVERY OTHER DAY.   b complex vitamins tablet Take 1 tablet by mouth every other day.   Coenzyme Q10 200 MG capsule Take 200 mg by mouth once a week.  famotidine (PEPCID) 20 MG tablet TAKE 1 TABLET BY MOUTH TWICE A DAY   metoprolol succinate (TOPROL-XL) 25 MG 24 hr tablet Take 0.5 tablets (12.5 mg total) by mouth daily.   Multiple Vitamin (MULTIVITAMIN WITH MINERALS) TABS tablet Take 1 tablet by mouth every other day.   Past Medical History:  Diagnosis Date   Abdominal pain 07/05/2017   ABNORMAL INVOLUNTARY MOVEMENTS 01/01/2009   Qualifier: Diagnosis of  By: Linna Darner MD, William     Allergic rhinitis     Allergic rhinitis 09/19/2008   Qualifier: Diagnosis of  By: Wynona Luna    Allergy    Arthralgia of right wrist 05/06/2020   Atypical chest pain 09/19/2008   Qualifier: Diagnosis of  By: Wynona Luna    Cervical cancer screening 05/08/2013   Menarche 11, regular Menopause 64s G2P2 s/p 2 svd No abnormal paps or MGM Fatty cyst on labia removed.    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   Qualifier: Diagnosis of  By: Yoo DO, [place]   Diverticulosis 05/06/2017   Essential hypertension 09/19/2008   Qualifier: Diagnosis of  By: Wynona Luna    GERD (gastroesophageal reflux disease)    H/O osteopenia 02/08/2016   H/O tobacco use, presenting hazards to health 09/19/2008   Qualifier: Diagnosis of  By: Wynona Luna Smoking roughly 5 cig daily  Last cigarette was in December of 2016   Head tremor 12/27/2016   Hearing loss in right ear 09/19/2008   Qualifier: Diagnosis of  By: Wynona Luna    Hx of adenomatous colonic polyps 01/27/2018   Hyperglycemia 10/07/2016   Hyperlipidemia    Hyperlipidemia, mixed 10/03/2008   Qualifier: Diagnosis of  By: Wynona Luna    Hypertension    Neuromuscular disorder [facility])    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 Fixed sigmoid colon prevents. Referred for surgery.   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 02/09/2017   Tobacco abuse    Tremor, essential    TREMOR, ESSENTIAL 09/19/2008   Qualifier: Diagnosis of  By: Wynona Luna    Vertigo    Vertigo    Past Surgical History:  Procedure Laterality Date   INNER EAR SURGERY  2015   with tube   MOUTH SURGERY  01/22/11   lower denture secured by titanium bolts   rectal suction biospy     XI ROBOTIC ASSISTED LOWER ANTERIOR RESECTION N/A 02/09/2017   Procedure: XI ROBOTIC RESECTION OF SIGMOID COLON, RIGID PROCTOSCOPY;  Surgeon: Michael Boston, MD;  Location: [facility];  Service: General;  Laterality: N/A;   Social History   Social History Narrative  Married, retired   2 children   Former smoker no drug use, does drink alcohol   family history includes Alcohol abuse in her mother; Heart attack (age of onset: 57) in her father; Heart disease in her paternal grandfather and paternal grandmother; Hypertension in her mother; Obesity in her daughter; Stroke in her mother.   Review of Systems As per HPI  Objective:   Physical Exam BP 128/70   Pulse 83   Ht 5\' 6"  (1.676 m)   Wt 211 lb (95.7 kg)   SpO2 94%   BMI 34.06 kg/m  Obese elderly ww NAD Head tremor present Abd obese, soft, nontender w/o mass or hernia

## 2021-08-11 ENCOUNTER — Encounter (HOSPITAL_BASED_OUTPATIENT_CLINIC_OR_DEPARTMENT_OTHER): Payer: Self-pay

## 2021-08-11 ENCOUNTER — Other Ambulatory Visit: Payer: Self-pay

## 2021-08-11 ENCOUNTER — Ambulatory Visit (HOSPITAL_BASED_OUTPATIENT_CLINIC_OR_DEPARTMENT_OTHER)
Admission: RE | Admit: 2021-08-11 | Discharge: 2021-08-11 | Disposition: A | Discharge status: Home / Self Care | Payer: Medicare Other | Source: Ambulatory Visit | Attending: Family Medicine | Admitting: Family Medicine

## 2021-08-11 DIAGNOSIS — Z1231 Encounter for screening mammogram for malignant neoplasm of breast: Secondary | ICD-10-CM | POA: Insufficient documentation

## 2021-08-11 DIAGNOSIS — E2839 Other primary ovarian failure: Secondary | ICD-10-CM | POA: Diagnosis present

## 2021-08-11 DIAGNOSIS — Z780 Asymptomatic menopausal state: Secondary | ICD-10-CM | POA: Diagnosis not present

## 2021-08-11 DIAGNOSIS — M85832 Other specified disorders of bone density and structure, left forearm: Secondary | ICD-10-CM | POA: Insufficient documentation

## 2021-08-11 DIAGNOSIS — Z1239 Encounter for other screening for malignant neoplasm of breast: Secondary | ICD-10-CM

## 2021-08-11 DIAGNOSIS — M8580 Other specified disorders of bone density and structure, unspecified site: Secondary | ICD-10-CM

## 2021-08-18 DIAGNOSIS — H90A31 Mixed conductive and sensorineural hearing loss, unilateral, right ear with restricted hearing on the contralateral side: Secondary | ICD-10-CM | POA: Diagnosis not present

## 2021-09-15 ENCOUNTER — Ambulatory Visit (INDEPENDENT_AMBULATORY_CARE_PROVIDER_SITE_OTHER): Payer: Medicare Other | Admitting: Family Medicine

## 2021-09-15 ENCOUNTER — Encounter: Payer: Self-pay | Admitting: Family Medicine

## 2021-09-15 VITALS — BP 124/72 | HR 84 | Temp 97.8°F | Resp 16 | Ht 68.0 in | Wt 211.8 lb

## 2021-09-15 DIAGNOSIS — E782 Mixed hyperlipidemia: Secondary | ICD-10-CM

## 2021-09-15 DIAGNOSIS — I10 Essential (primary) hypertension: Secondary | ICD-10-CM

## 2021-09-15 DIAGNOSIS — R739 Hyperglycemia, unspecified: Secondary | ICD-10-CM | POA: Diagnosis not present

## 2021-09-15 DIAGNOSIS — Z8739 Personal history of other diseases of the musculoskeletal system and connective tissue: Secondary | ICD-10-CM | POA: Diagnosis not present

## 2021-09-15 DIAGNOSIS — Z9622 Myringotomy tube(s) status: Secondary | ICD-10-CM

## 2021-09-15 DIAGNOSIS — M25531 Pain in right wrist: Secondary | ICD-10-CM

## 2021-09-15 DIAGNOSIS — M8580 Other specified disorders of bone density and structure, unspecified site: Secondary | ICD-10-CM

## 2021-09-15 DIAGNOSIS — G250 Essential tremor: Secondary | ICD-10-CM

## 2021-09-15 DIAGNOSIS — E785 Hyperlipidemia, unspecified: Secondary | ICD-10-CM | POA: Diagnosis not present

## 2021-09-15 DIAGNOSIS — R251 Tremor, unspecified: Secondary | ICD-10-CM

## 2021-09-15 LAB — COMPREHENSIVE METABOLIC PANEL
ALT: 20 U/L (ref 0–35)
AST: 18 U/L (ref 0–37)
Albumin: 4.3 g/dL (ref 3.5–5.2)
Alkaline Phosphatase: 84 U/L (ref 39–117)
BUN: 14 mg/dL (ref 6–23)
CO2: 26 mEq/L (ref 19–32)
Calcium: 9.2 mg/dL (ref 8.4–10.5)
Chloride: 104 mEq/L (ref 96–112)
Creatinine, Ser: 0.7 mg/dL (ref 0.40–1.20)
GFR: 84.48 mL/min (ref >60.00)
Glucose, Bld: 107 mg/dL — ABNORMAL HIGH (ref 70–99)
Potassium: 4.5 mEq/L (ref 3.5–5.1)
Sodium: 137 mEq/L (ref 135–145)
Total Bilirubin: 1 mg/dL (ref 0.2–1.2)
Total Protein: 7 g/dL (ref 6.0–8.3)

## 2021-09-15 LAB — CBC WITH DIFFERENTIAL/PLATELET
Basophils Absolute: 0 10*3/uL (ref 0.0–0.1)
Basophils Relative: 0.8 % (ref 0.0–3.0)
Eosinophils Absolute: 0.1 10*3/uL (ref 0.0–0.7)
Eosinophils Relative: 2.1 % (ref 0.0–5.0)
HCT: 44.4 % (ref 36.0–46.0)
Hemoglobin: 14.8 g/dL (ref 12.0–15.0)
Lymphocytes Relative: 25.3 % (ref 12.0–46.0)
Lymphs Abs: 1.5 10*3/uL (ref 0.7–4.0)
MCHC: 33.3 g/dL (ref 30.0–36.0)
MCV: 101.5 fl — ABNORMAL HIGH (ref 78.0–100.0)
Monocytes Absolute: 0.6 10*3/uL (ref 0.1–1.0)
Monocytes Relative: 10.5 % (ref 3.0–12.0)
Neutro Abs: 3.7 10*3/uL (ref 1.4–7.7)
Neutrophils Relative %: 61.3 % (ref 43.0–77.0)
Platelets: 241 10*3/uL (ref 150.0–400.0)
RBC: 4.37 Mil/uL (ref 3.87–5.11)
RDW: 14.7 % (ref 11.5–15.5)
WBC: 6 10*3/uL (ref 4.0–10.5)

## 2021-09-15 LAB — LIPID PANEL
Cholesterol: 216 mg/dL — ABNORMAL HIGH (ref 0–200)
HDL: 75.8 mg/dL (ref >39.00)
LDL Cholesterol: 118 mg/dL — ABNORMAL HIGH (ref 0–99)
NonHDL: 140.6
Total CHOL/HDL Ratio: 3
Triglycerides: 112 mg/dL (ref 0.0–149.0)
VLDL: 22.4 mg/dL (ref 0.0–40.0)

## 2021-09-15 LAB — TSH: TSH: 1.28 u[IU]/mL (ref 0.35–5.50)

## 2021-09-15 LAB — HEMOGLOBIN A1C: Hgb A1c MFr Bld: 5.8 % (ref 4.6–6.5)

## 2021-09-15 NOTE — Assessment & Plan Note (Signed)
Well controlled, no changes to meds. Encouraged heart healthy diet such as the DASH diet and exercise as tolerated.  °

## 2021-09-15 NOTE — Assessment & Plan Note (Signed)
Removed from right ear recently ear feels better, less but still present vertigo, hearing better. Less discomfort

## 2021-09-15 NOTE — Assessment & Plan Note (Signed)
Encourage heart healthy diet such as MIND or DASH diet, increase exercise, avoid trans fats, simple carbohydrates and processed foods, consider a krill or fish or flaxseed oil cap daily.  °

## 2021-09-15 NOTE — Assessment & Plan Note (Signed)
Encourage heart healthy diet such as MIND or DASH diet, increase exercise, avoid trans fats, simple carbohydrates and processed foods, consider a krill or fish or flaxseed oil cap daily. Tolerating Atorvastatin 

## 2021-09-15 NOTE — Assessment & Plan Note (Signed)
Encouraged to get adequate exercise, calcium and vitamin d intake 

## 2021-09-15 NOTE — Assessment & Plan Note (Signed)
hgba1c acceptable, minimize simple carbs. Increase exercise as tolerated.  

## 2021-09-15 NOTE — Patient Instructions (Signed)
Bone density shows osteopenia, which is thinner than normal but not as bad as osteoporosis. Recommend calcium intake of 1200 to 1500 mg daily, divided into roughly 3 doses. Best source is the diet and a single dairy serving is about 500 mg, a supplement of calcium citrate  (Citracal) once or twice daily to balance diet is fine if not getting enough in diet. Also need Vitamin D 2000 IU caps, 1 cap daily if not already taking vitamin D. Also recommend weight baring exercise on hips and upper body to keep bones strong

## 2021-09-16 NOTE — Assessment & Plan Note (Signed)
Worst in head but also present in hands. Is worsening over time and patient would like to have a neurologic evaluation. Referral is placed.

## 2021-09-16 NOTE — Progress Notes (Signed)
Subjective:    Patient ID: Renee [name], female    DOB: 04/16/1946, 76 y.o.   MRN: [id number]  Chief Complaint  Patient presents with   4 months follow up    HPI Patient is in today for follow up on chronic medical concerns.  No recent febrile illness or hospitalizations.  She is trying to stay active and eat well.  Her greatest concern today is of her worsening tremor.  She has had the tremor for many years but does note it is getting worse and starting to affect her activities of daily living to some degree and she would like to have it further evaluated for reassurance with neurology. Denies CP/palp/SOB/HA/congestion/fevers/GI or GU c/o. Taking meds as prescribed   Past Medical History:  Diagnosis Date   Abdominal pain 07/05/2017   ABNORMAL INVOLUNTARY MOVEMENTS 01/01/2009   Qualifier: Diagnosis of  By: Linna Darner MD, William     Allergic rhinitis    Allergic rhinitis 09/19/2008   Qualifier: Diagnosis of  By: Wynona Luna    Allergy    Arthralgia of right wrist 05/06/2020   Atypical chest pain 09/19/2008   Qualifier: Diagnosis of  By: Wynona Luna    Cervical cancer screening 05/08/2013   Menarche 11, regular Menopause 19s G2P2 s/p 2 svd No abnormal paps or MGM Fatty cyst on labia removed.    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   Qualifier: Diagnosis of  By: Yoo DO, [place]   Diverticulosis 05/06/2017   Essential hypertension 09/19/2008   Qualifier: Diagnosis of  By: Wynona Luna    GERD (gastroesophageal reflux disease)    H/O osteopenia 02/08/2016   H/O tobacco use, presenting hazards to health 09/19/2008   Qualifier: Diagnosis of  By: Wynona Luna Smoking roughly 5 cig daily  Last cigarette was in December of 2016   Head tremor 12/27/2016   Hearing loss in right ear 09/19/2008   Qualifier: Diagnosis of  By: Wynona Luna    Hx of  adenomatous colonic polyps 01/27/2018   Hyperglycemia 10/07/2016   Hyperlipidemia    Hyperlipidemia, mixed 10/03/2008   Qualifier: Diagnosis of  By: Wynona Luna    Hypertension    Neuromuscular disorder [facility])    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 Fixed sigmoid colon prevents. Referred for surgery.   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 02/09/2017   Tobacco abuse    Tremor, essential    TREMOR, ESSENTIAL 09/19/2008   Qualifier: Diagnosis of  By: Wynona Luna    Vertigo    Vertigo     Past Surgical History:  Procedure Laterality Date  INNER EAR SURGERY  2015   with tube   MOUTH SURGERY  01/22/11   lower denture secured by titanium bolts   rectal suction biospy     XI ROBOTIC ASSISTED LOWER ANTERIOR RESECTION N/A 02/09/2017   Procedure: XI ROBOTIC RESECTION OF SIGMOID COLON, RIGID PROCTOSCOPY;  Surgeon: Michael Boston, MD;  Location: [facility];  Service: General;  Laterality: N/A;    Family History  Problem Relation Age of Onset   Heart attack Father 23       deceased   Stroke Mother    Hypertension Mother    Alcohol abuse Mother    Obesity Daughter    Heart disease Paternal Grandmother        AAA rupture   Heart disease Paternal Grandfather        MI   Colon cancer Neg Hx    Breast cancer Neg Hx    Prostate cancer Neg Hx    Diabetes Neg Hx    Liver cancer Neg Hx    Rectal  cancer Neg Hx    Stomach cancer Neg Hx     Social History   Socioeconomic History   Marital status: Married    Spouse name: Not on file   Number of children: 2   Years of education: Not on file   Highest education level: Not on file  Occupational History   Occupation: retired  Tobacco Use   Smoking status: Former    Packs/day: 0.50    Types: Cigarettes    Quit date: 09/07/2015    Years since quitting: 6.0   Smokeless tobacco: Never  Vaping Use   Vaping Use: Never used  Substance and Sexual Activity   Alcohol use: Yes   Drug use: No   Sexual activity: Yes  Other Topics Concern   Not on file  Social History Narrative   Married, retired   2 children   Former smoker no drug use, does drink alcohol   Social Determinants of Radio broadcast assistant Strain: Not on file  Food Insecurity: Not on file  Transportation Needs: Not on file  Physical Activity: Not on file  Stress: Not on file  Social Connections: Not on file  Intimate Partner Violence: Not on file    Outpatient Medications Prior to Visit  Medication Sig Dispense Refill   atorvastatin (LIPITOR) 10 MG tablet TAKE 1/2 TABLET (5 MG TOTAL) BY MOUTH EVERY OTHER DAY. 23 tablet 3   b complex vitamins tablet Take 1 tablet by mouth every other day.     Coenzyme Q10 200 MG capsule Take 200 mg by mouth once a week.      famotidine (PEPCID) 20 MG tablet TAKE 1 TABLET BY MOUTH TWICE A DAY 180 tablet 1   metoprolol succinate (TOPROL-XL) 25 MG 24 hr tablet Take 0.5 tablets (12.5 mg total) by mouth daily. 45 tablet 3   Multiple Vitamin (MULTIVITAMIN WITH MINERALS) TABS tablet Take 1 tablet by mouth every other day.     No facility-administered medications prior to visit.    Allergies  Allergen Reactions   Amoxicillin     REACTION: TROUBLE BREATHING Has patient had a PCN reaction causing immediate rash, facial/tongue/throat swelling, SOB or lightheadedness with hypotension:Yes Has patient had a PCN reaction causing  severe rash involving mucus membranes or skin necrosis:No Has patient had a PCN reaction that required hospitalization:No Has patient had a PCN reaction occurring within the last 10 years:No If all of the above answers are "NO", then  may proceed with Cephalosporin use.    Proair Hfa [Albuterol] Other (See Comments)    Hypersensitivity, anxious, disoriented   Penicillins Rash    Has patient had a PCN reaction causing immediate rash, facial/tongue/throat swelling, SOB or lightheadedness with hypotension:unknown Has patient had a PCN reaction causing severe rash involving mucus membranes or skin necrosis:unknown Has patient had a PCN reaction that required hospitalization:no Has patient had a PCN reaction occurring within the last 10 years:no If all of the above answers are "NO", then may proceed with Cephalosporin use.     Review of Systems  Constitutional:  Negative for fever and malaise/fatigue.  HENT:  Negative for congestion.   Eyes:  Negative for blurred vision.  Respiratory:  Negative for shortness of breath.   Cardiovascular:  Negative for chest pain, palpitations and leg swelling.  Gastrointestinal:  Negative for abdominal pain, blood in stool and nausea.  Genitourinary:  Negative for dysuria and frequency.  Musculoskeletal:  Negative for falls.  Skin:  Negative for rash.  Neurological:  Positive for tremors. Negative for dizziness, loss of consciousness and headaches.  Endo/Heme/Allergies:  Negative for environmental allergies.  Psychiatric/Behavioral:  Negative for depression. The patient is not nervous/anxious.       Objective:    Physical Exam Constitutional:      General: She is not in acute distress.    Appearance: She is well-developed.  HENT:     Head: Normocephalic and atraumatic.  Eyes:     Conjunctiva/sclera: Conjunctivae normal.  Neck:     Thyroid: No thyromegaly.  Cardiovascular:     Rate and Rhythm: Normal rate and regular rhythm.     Heart sounds:  Normal heart sounds. No murmur heard. Pulmonary:     Effort: Pulmonary effort is normal. No respiratory distress.     Breath sounds: Normal breath sounds.  Abdominal:     General: Bowel sounds are normal. There is no distension.     Palpations: Abdomen is soft. There is no mass.     Tenderness: There is no abdominal tenderness.  Musculoskeletal:     Cervical back: Neck supple.  Lymphadenopathy:     Cervical: No cervical adenopathy.  Skin:    General: Skin is warm and dry.  Neurological:     Mental Status: She is alert and oriented to person, place, and time.     Comments: Tremor, head, hands  Psychiatric:        Behavior: Behavior normal.    BP 124/72    Pulse 84    Temp 97.8 F (36.6 C)    Resp 16    Ht 5\' 8"  (1.727 m)    Wt 211 lb 12.8 oz (96.1 kg)    SpO2 94%    BMI 32.20 kg/m  Wt Readings from Last 3 Encounters:  09/15/21 211 lb 12.8 oz (96.1 kg)  07/23/21 211 lb (95.7 kg)  07/06/21 211 lb (95.7 kg)    Diabetic Foot Exam - Simple   No data filed    Lab Results  Component Value Date   WBC 6.0 09/15/2021   HGB 14.8 09/15/2021   HCT 44.4 09/15/2021   PLT 241.0 09/15/2021   GLUCOSE 107 (H) 09/15/2021   CHOL 216 (H) 09/15/2021   TRIG 112.0 09/15/2021   HDL 75.80 09/15/2021   LDLDIRECT 148.6 09/26/2008   LDLCALC 118 (H) 09/15/2021   ALT 20 09/15/2021   AST 18 09/15/2021   NA 137 09/15/2021   K 4.5 09/15/2021   CL 104 09/15/2021  CREATININE 0.70 09/15/2021   BUN 14 09/15/2021   CO2 26 09/15/2021   TSH 1.28 09/15/2021   HGBA1C 5.8 09/15/2021    Lab Results  Component Value Date   TSH 1.28 09/15/2021   Lab Results  Component Value Date   WBC 6.0 09/15/2021   HGB 14.8 09/15/2021   HCT 44.4 09/15/2021   MCV 101.5 (H) 09/15/2021   PLT 241.0 09/15/2021   Lab Results  Component Value Date   NA 137 09/15/2021   K 4.5 09/15/2021   CO2 26 09/15/2021   GLUCOSE 107 (H) 09/15/2021   BUN 14 09/15/2021   CREATININE 0.70 09/15/2021   BILITOT 1.0 09/15/2021    ALKPHOS 84 09/15/2021   AST 18 09/15/2021   ALT 20 09/15/2021   PROT 7.0 09/15/2021   ALBUMIN 4.3 09/15/2021   CALCIUM 9.2 09/15/2021   ANIONGAP 6 02/10/2017   GFR 84.48 09/15/2021   Lab Results  Component Value Date   CHOL 216 (H) 09/15/2021   Lab Results  Component Value Date   HDL 75.80 09/15/2021   Lab Results  Component Value Date   LDLCALC 118 (H) 09/15/2021   Lab Results  Component Value Date   TRIG 112.0 09/15/2021   Lab Results  Component Value Date   CHOLHDL 3 09/15/2021   Lab Results  Component Value Date   HGBA1C 5.8 09/15/2021       Assessment & Plan:   Problem List Items Addressed This Visit     Essential hypertension    Well controlled, no changes to meds. Encouraged heart healthy diet such as the DASH diet and exercise as tolerated.       Relevant Orders   CBC with Differential/Platelet (Completed)   Comprehensive metabolic panel (Completed)   TSH (Completed)   H/O osteopenia    Encouraged to get adequate exercise, calcium and vitamin d intake      Hyperglycemia    hgba1c acceptable, minimize simple carbs. Increase exercise as tolerated.       Relevant Orders   Hemoglobin A1c (Completed)   Head tremor   Relevant Orders   Ambulatory referral to Neurology   Presence of tympanostomy tube in tympanic membrane    Removed from right ear recently ear feels better, less but still present vertigo, hearing better. Less discomfort      Arthralgia of right wrist   Hypertension    Well controlled, no changes to meds. Encouraged heart healthy diet such as the DASH diet and exercise as tolerated.       Hyperlipidemia, mixed    Encourage heart healthy diet such as MIND or DASH diet, increase exercise, avoid trans fats, simple carbohydrates and processed foods, consider a krill or fish or flaxseed oil cap daily. Tolerating Atorvastatin      Osteopenia   RESOLVED: Hyperlipidemia - Primary    Encourage heart healthy diet such as MIND or DASH  diet, increase exercise, avoid trans fats, simple carbohydrates and processed foods, consider a krill or fish or flaxseed oil cap daily.       Relevant Orders   Comprehensive metabolic panel (Completed)   Lipid panel (Completed)   TSH (Completed)   Other Visit Diagnoses     Tremor of both hands       Relevant Orders   Ambulatory referral to Neurology       I am having Renee [name] maintain her b complex vitamins, multivitamin with minerals, Coenzyme Q10, metoprolol succinate, famotidine, and atorvastatin.  No orders of  the defined types were placed in this encounter.    Penni Homans, MD

## 2021-09-22 ENCOUNTER — Ambulatory Visit: Payer: Medicare Other | Admitting: Neurology
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
## 2021-10-20 DIAGNOSIS — J301 Allergic rhinitis due to pollen: Secondary | ICD-10-CM | POA: Diagnosis not present

## 2021-10-20 DIAGNOSIS — R42 Dizziness and giddiness: Secondary | ICD-10-CM | POA: Diagnosis not present

## 2021-10-20 DIAGNOSIS — H90A31 Mixed conductive and sensorineural hearing loss, unilateral, right ear with restricted hearing on the contralateral side: Secondary | ICD-10-CM | POA: Diagnosis not present

## 2021-10-20 DIAGNOSIS — H7201 Central perforation of tympanic membrane, right ear: Secondary | ICD-10-CM | POA: Diagnosis not present

## 2021-10-21 ENCOUNTER — Encounter: Payer: Self-pay | Admitting: Neurology

## 2021-10-21 ENCOUNTER — Ambulatory Visit: Payer: Medicare Other | Admitting: Neurology

## 2021-10-21 ENCOUNTER — Other Ambulatory Visit: Payer: Self-pay

## 2021-10-21 VITALS — BP 165/92 | HR 86 | Ht 66.0 in | Wt 212.6 lb

## 2021-10-21 DIAGNOSIS — G243 Spasmodic torticollis: Secondary | ICD-10-CM

## 2021-10-21 DIAGNOSIS — H9190 Unspecified hearing loss, unspecified ear: Secondary | ICD-10-CM | POA: Diagnosis not present
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
## 2021-12-27 ENCOUNTER — Other Ambulatory Visit: Payer: Self-pay | Admitting: Nurse Practitioner

## 2021-12-27 DIAGNOSIS — K219 Gastro-esophageal reflux disease without esophagitis: Secondary | ICD-10-CM

## 2021-12-27 DIAGNOSIS — K2289 Other specified disease of esophagus: Secondary | ICD-10-CM

## 2021-12-27 DIAGNOSIS — R1310 Dysphagia, unspecified: Secondary | ICD-10-CM

## 2022-01-05 ENCOUNTER — Telehealth: Payer: Self-pay | Admitting: Family Medicine

## 2022-01-05 NOTE — Telephone Encounter (Signed)
Copied from [place] [phone]. Topic: Medicare AWV ?>> Jan 05, 2022  2:28 PM Harris-Coley, Hannah Beat wrote: ?Reason for CRM: Left message for patient to schedule Annual Wellness Visit.  Please schedule (telephone/video call) with Nurse Health Advisor Charlott Rakes, RN at [facility]. Please call [phone] ask for Juliann Pulse ?

## 2022-01-13 NOTE — Progress Notes (Deleted)
Subjective:    Patient ID: Renee [name], female    DOB: 10-12-1945, 76 y.o.   MRN: [id number]  No chief complaint on file.   HPI Patient is in today for a follow up.  Past Medical History:  Diagnosis Date   Abdominal pain 07/05/2017   ABNORMAL INVOLUNTARY MOVEMENTS 01/01/2009   Qualifier: Diagnosis of  By: Linna Darner MD, William     Allergic rhinitis    Allergic rhinitis 09/19/2008   Qualifier: Diagnosis of  By: Wynona Luna    Allergy    Arthralgia of right wrist 05/06/2020   Atypical chest pain 09/19/2008   Qualifier: Diagnosis of  By: Wynona Luna    Cervical cancer screening 05/08/2013   Menarche 11, regular Menopause 75s G2P2 s/p 2 svd No abnormal paps or [employer] Fatty cyst on labia removed.    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   Qualifier: Diagnosis of  By: Yoo DO, [place]   Diverticulosis 05/06/2017   Essential hypertension 09/19/2008   Qualifier: Diagnosis of  By: Wynona Luna    GERD (gastroesophageal reflux disease)    H/O osteopenia 02/08/2016   H/O tobacco use, presenting hazards to health 09/19/2008   Qualifier: Diagnosis of  By: Wynona Luna Smoking roughly 5 cig daily  Last cigarette was in December of 2016   Head tremor 12/27/2016   Hearing loss in right ear 09/19/2008   Qualifier: Diagnosis of  By: Wynona Luna    Hx of adenomatous colonic polyps 01/27/2018   Hyperglycemia 10/07/2016   Hyperlipidemia    Hyperlipidemia, mixed 10/03/2008   Qualifier: Diagnosis of  By: Wynona Luna    Hypertension    Neuromuscular disorder [facility])    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 Fixed sigmoid colon prevents. Referred for surgery.   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 02/09/2017   Tobacco abuse    Tremor, essential    TREMOR, ESSENTIAL 09/19/2008   Qualifier: Diagnosis of  By: Wynona Luna    Vertigo    Vertigo     Past Surgical History:  Procedure Laterality Date   INNER EAR SURGERY  2015   with tube   MOUTH SURGERY  01/22/11   lower denture secured by titanium bolts   rectal suction biospy     XI ROBOTIC ASSISTED LOWER ANTERIOR RESECTION N/A 02/09/2017   Procedure: XI ROBOTIC RESECTION OF SIGMOID COLON, RIGID PROCTOSCOPY;  Surgeon: Michael Boston, MD;  Location: [facility];  Service: General;  Laterality: N/A;    Family History  Problem Relation Age of Onset   Stroke Mother    Hypertension Mother  Alcohol abuse Mother    Tremor Mother    Heart attack Father 17       deceased   Heart disease Paternal Grandmother        AAA rupture   Heart disease Paternal Grandfather        MI   Obesity Daughter    Colon cancer Neg Hx    Breast cancer Neg Hx    Prostate cancer Neg Hx    Diabetes Neg Hx    Liver cancer Neg Hx    Rectal cancer Neg Hx    Stomach cancer Neg Hx     Social History   Socioeconomic History   Marital status: Married    Spouse name: Not on file   Number of children: 2   Years of education: Not on file   Highest education level: Not on file  Occupational History   Occupation: retired    Comment: banking  Tobacco Use   Smoking status: Former    Packs/day: 0.50    Types: Cigarettes    Quit date: 09/07/2015    Years since quitting: 6.3   Smokeless tobacco: Never  Vaping Use    Vaping Use: Never used  Substance and Sexual Activity   Alcohol use: Yes    Comment: 2 glasses wine/day   Drug use: No   Sexual activity: Yes  Other Topics Concern   Not on file  Social History Narrative   Married, retired   2 children   Former smoker no drug use, does drink alcohol   Right handed   Social Determinants of Radio broadcast assistant Strain: Not on file  Food Insecurity: Not on file  Transportation Needs: Not on file  Physical Activity: Not on file  Stress: Not on file  Social Connections: Not on file  Intimate Partner Violence: Not on file    Outpatient Medications Prior to Visit  Medication Sig Dispense Refill   atorvastatin (LIPITOR) 10 MG tablet TAKE 1/2 TABLET (5 MG TOTAL) BY MOUTH EVERY OTHER DAY. 23 tablet 3   b complex vitamins tablet Take 1 tablet by mouth every other day.     Coenzyme Q10 200 MG capsule Take 200 mg by mouth once a week.      famotidine (PEPCID) 20 MG tablet TAKE 1 TABLET BY MOUTH TWICE A DAY 180 tablet 1   metoprolol succinate (TOPROL-XL) 25 MG 24 hr tablet Take 0.5 tablets (12.5 mg total) by mouth daily. 45 tablet 3   Multiple Vitamin (MULTIVITAMIN WITH MINERALS) TABS tablet Take 1 tablet by mouth every other day.     No facility-administered medications prior to visit.    Allergies  Allergen Reactions   Amoxicillin     REACTION: TROUBLE BREATHING Has patient had a PCN reaction causing immediate rash, facial/tongue/throat swelling, SOB or lightheadedness with hypotension:Yes Has patient had a PCN reaction causing severe rash involving mucus membranes or skin necrosis:No Has patient had a PCN reaction that required hospitalization:No Has patient had a PCN reaction occurring within the last 10 years:No If all of the above answers are "NO", then may proceed with Cephalosporin use.    Proair Hfa [Albuterol] Other (See Comments)    Hypersensitivity, anxious, disoriented   Penicillins Rash    Has patient had a PCN reaction  causing immediate rash, facial/tongue/throat swelling, SOB or lightheadedness with hypotension:unknown Has patient had a PCN reaction causing severe rash involving mucus membranes or skin necrosis:unknown Has patient had a PCN reaction that required hospitalization:no Has  patient had a PCN reaction occurring within the last 10 years:no If all of the above answers are "NO", then may proceed with Cephalosporin use.     ROS     Objective:    Physical Exam  There were no vitals taken for this visit. Wt Readings from Last 3 Encounters:  10/21/21 212 lb 9.6 oz (96.4 kg)  09/15/21 211 lb 12.8 oz (96.1 kg)  07/23/21 211 lb (95.7 kg)    Diabetic Foot Exam - Simple   No data filed    Lab Results  Component Value Date   WBC 6.0 09/15/2021   HGB 14.8 09/15/2021   HCT 44.4 09/15/2021   PLT 241.0 09/15/2021   GLUCOSE 107 (H) 09/15/2021   CHOL 216 (H) 09/15/2021   TRIG 112.0 09/15/2021   HDL 75.80 09/15/2021   LDLDIRECT 148.6 09/26/2008   LDLCALC 118 (H) 09/15/2021   ALT 20 09/15/2021   AST 18 09/15/2021   NA 137 09/15/2021   K 4.5 09/15/2021   CL 104 09/15/2021   CREATININE 0.70 09/15/2021   BUN 14 09/15/2021   CO2 26 09/15/2021   TSH 1.28 09/15/2021   HGBA1C 5.8 09/15/2021    Lab Results  Component Value Date   TSH 1.28 09/15/2021   Lab Results  Component Value Date   WBC 6.0 09/15/2021   HGB 14.8 09/15/2021   HCT 44.4 09/15/2021   MCV 101.5 (H) 09/15/2021   PLT 241.0 09/15/2021   Lab Results  Component Value Date   NA 137 09/15/2021   K 4.5 09/15/2021   CO2 26 09/15/2021   GLUCOSE 107 (H) 09/15/2021   BUN 14 09/15/2021   CREATININE 0.70 09/15/2021   BILITOT 1.0 09/15/2021   ALKPHOS 84 09/15/2021   AST 18 09/15/2021   ALT 20 09/15/2021   PROT 7.0 09/15/2021   ALBUMIN 4.3 09/15/2021   CALCIUM 9.2 09/15/2021   ANIONGAP 6 02/10/2017   GFR 84.48 09/15/2021   Lab Results  Component Value Date   CHOL 216 (H) 09/15/2021   Lab Results  Component Value  Date   HDL 75.80 09/15/2021   Lab Results  Component Value Date   LDLCALC 118 (H) 09/15/2021   Lab Results  Component Value Date   TRIG 112.0 09/15/2021   Lab Results  Component Value Date   CHOLHDL 3 09/15/2021   Lab Results  Component Value Date   HGBA1C 5.8 09/15/2021       Assessment & Plan:   Problem List Items Addressed This Visit   None   I am having Renee [name] maintain her b complex vitamins, multivitamin with minerals, Coenzyme Q10, metoprolol succinate, atorvastatin, and famotidine.  No orders of the defined types were placed in this encounter.

## 2022-01-14 ENCOUNTER — Encounter: Payer: Self-pay | Admitting: Family Medicine

## 2022-01-14 ENCOUNTER — Ambulatory Visit (INDEPENDENT_AMBULATORY_CARE_PROVIDER_SITE_OTHER): Payer: Medicare Other | Admitting: Family Medicine

## 2022-01-14 VITALS — BP 138/86 | HR 74 | Resp 20 | Ht 66.0 in | Wt 214.8 lb

## 2022-01-14 DIAGNOSIS — M8580 Other specified disorders of bone density and structure, unspecified site: Secondary | ICD-10-CM | POA: Diagnosis not present

## 2022-01-14 DIAGNOSIS — E782 Mixed hyperlipidemia: Secondary | ICD-10-CM

## 2022-01-14 DIAGNOSIS — H7191 Unspecified cholesteatoma, right ear: Secondary | ICD-10-CM | POA: Diagnosis not present

## 2022-01-14 DIAGNOSIS — G250 Essential tremor: Secondary | ICD-10-CM | POA: Diagnosis not present

## 2022-01-14 DIAGNOSIS — Z8739 Personal history of other diseases of the musculoskeletal system and connective tissue: Secondary | ICD-10-CM

## 2022-01-14 DIAGNOSIS — R739 Hyperglycemia, unspecified: Secondary | ICD-10-CM | POA: Diagnosis not present

## 2022-01-14 DIAGNOSIS — E559 Vitamin D deficiency, unspecified: Secondary | ICD-10-CM

## 2022-01-14 DIAGNOSIS — J309 Allergic rhinitis, unspecified: Secondary | ICD-10-CM

## 2022-01-14 DIAGNOSIS — R0602 Shortness of breath: Secondary | ICD-10-CM

## 2022-01-14 DIAGNOSIS — Z23 Encounter for immunization: Secondary | ICD-10-CM | POA: Diagnosis not present

## 2022-01-14 DIAGNOSIS — I10 Essential (primary) hypertension: Secondary | ICD-10-CM | POA: Diagnosis not present

## 2022-01-14 DIAGNOSIS — R0789 Other chest pain: Secondary | ICD-10-CM

## 2022-01-14 LAB — HEMOGLOBIN A1C: Hgb A1c MFr Bld: 5.9 % (ref 4.6–6.5)

## 2022-01-14 LAB — LIPID PANEL
Cholesterol: 236 mg/dL — ABNORMAL HIGH (ref 0–200)
HDL: 72.6 mg/dL (ref >39.00)
LDL Cholesterol: 139 mg/dL — ABNORMAL HIGH (ref 0–99)
NonHDL: 163.48
Total CHOL/HDL Ratio: 3
Triglycerides: 123 mg/dL (ref 0.0–149.0)
VLDL: 24.6 mg/dL (ref 0.0–40.0)

## 2022-01-14 LAB — CBC
HCT: 45.8 % (ref 36.0–46.0)
Hemoglobin: 15.2 g/dL — ABNORMAL HIGH (ref 12.0–15.0)
MCHC: 33.2 g/dL (ref 30.0–36.0)
MCV: 100.3 fl — ABNORMAL HIGH (ref 78.0–100.0)
Platelets: 256 10*3/uL (ref 150.0–400.0)
RBC: 4.57 Mil/uL (ref 3.87–5.11)
RDW: 14.9 % (ref 11.5–15.5)
WBC: 6.5 10*3/uL (ref 4.0–10.5)

## 2022-01-14 LAB — COMPREHENSIVE METABOLIC PANEL
ALT: 18 U/L (ref 0–35)
AST: 16 U/L (ref 0–37)
Albumin: 4.4 g/dL (ref 3.5–5.2)
Alkaline Phosphatase: 81 U/L (ref 39–117)
BUN: 16 mg/dL (ref 6–23)
CO2: 27 mEq/L (ref 19–32)
Calcium: 9.4 mg/dL (ref 8.4–10.5)
Chloride: 102 mEq/L (ref 96–112)
Creatinine, Ser: 0.73 mg/dL (ref 0.40–1.20)
GFR: 80.14 mL/min (ref >60.00)
Glucose, Bld: 107 mg/dL — ABNORMAL HIGH (ref 70–99)
Potassium: 4.7 mEq/L (ref 3.5–5.1)
Sodium: 137 mEq/L (ref 135–145)
Total Bilirubin: 0.9 mg/dL (ref 0.2–1.2)
Total Protein: 7.1 g/dL (ref 6.0–8.3)

## 2022-01-14 LAB — VITAMIN D 25 HYDROXY (VIT D DEFICIENCY, FRACTURES): VITD: 24.89 ng/mL — ABNORMAL LOW (ref 30.00–100.00)

## 2022-01-14 LAB — TSH: TSH: 1.4 u[IU]/mL (ref 0.35–5.50)

## 2022-01-14 NOTE — Assessment & Plan Note (Signed)

## 2022-01-14 NOTE — Assessment & Plan Note (Signed)
Stable

## 2022-01-14 NOTE — Assessment & Plan Note (Signed)
hgba1c acceptable, minimize simple carbs. Increase exercise as tolerated.  

## 2022-01-14 NOTE — Assessment & Plan Note (Signed)
Manage prn with antihistamines.

## 2022-01-14 NOTE — Progress Notes (Signed)
Subjective:   By signing my name below, I, Zite Okoli, attest that this documentation has been prepared under the direction and in the presence of [name], MD. 01/14/2022     Patient ID: Renee [name], female    DOB: 10/13/1945, 76 y.o.   MRN: [id number]  Chief Complaint  Patient presents with   Follow-up    HPI Patient is in today for an office visit and 4 month f/u  She complains of ear pain and is following up with ENT and an audiologist. She thinks her balance issues are getting worse. She notes it is worse today because she has sinus problems and can feel the pressure worse in her right ear.She had a tympanotomy removal but still feels the pressure with the allergies.  Her blood pressure is elevated at this time but better compared to the last visit. She adds she is stressed at this time.  BP Readings from Last 3 Encounters:  01/14/22 138/86  10/21/21 (!) 165/92  09/15/21 124/72    She checks her blood pressure at home and systolic ranges between 144-315 and diastolic is in the 40'G.   She would like to see a cardiologist because there is a discomfort in her chest and occasional shortness of breath.  She has 3 Covid-19 vaccines at this time. She would like to receive the shingles vaccine but is worried because she does not think she had chicken-pox.    Past Medical History:  Diagnosis Date   Abdominal pain 07/05/2017   ABNORMAL INVOLUNTARY MOVEMENTS 01/01/2009   Qualifier: Diagnosis of  By: Linna Darner MD, [place]     Allergic rhinitis    Allergic rhinitis 09/19/2008   Qualifier: Diagnosis of  By: Wynona Luna    Allergy    Arthralgia of right wrist 05/06/2020   Atypical chest pain 09/19/2008   Qualifier: Diagnosis of  By: Wynona Luna    Cervical cancer screening 05/08/2013   Menarche 11, regular Menopause 72s G2P2 s/p 2 svd No abnormal paps or [employer] Fatty cyst on labia removed.    Cervical pain (neck) 10/07/2015   CHEST PAIN 09/19/2008   Qualifier:  Diagnosis of  By: Wynona Luna    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   Qualifier: Diagnosis of  By: Yoo DO, [place]   Diverticulosis 05/06/2017   Essential hypertension 09/19/2008   Qualifier: Diagnosis of  By: Wynona Luna    GERD (gastroesophageal reflux disease)    H/O osteopenia 02/08/2016   H/O tobacco use, presenting hazards to health 09/19/2008   Qualifier: Diagnosis of  By: Wynona Luna Smoking roughly 5 cig daily  Last cigarette was in December of 2016   Head tremor 12/27/2016   Hearing loss in right ear 09/19/2008   Qualifier: Diagnosis of  By: Wynona Luna    Hx of adenomatous colonic polyps 01/27/2018   Hyperglycemia 10/07/2016   Hyperlipidemia    Hyperlipidemia, mixed 10/03/2008   Qualifier: Diagnosis of  By: Wynona Luna    Hypertension    Neuromuscular disorder [facility])    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 Fixed sigmoid colon prevents. Referred for surgery.   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 02/09/2017   Tobacco abuse    Tremor, essential    TREMOR, ESSENTIAL 09/19/2008   Qualifier: Diagnosis of  By: Wynona Luna    Vertigo    Vertigo     Past Surgical History:  Procedure Laterality Date   INNER EAR SURGERY  2015   with tube   MOUTH SURGERY  01/22/11   lower denture secured by titanium bolts   rectal suction biospy     XI  ROBOTIC ASSISTED LOWER ANTERIOR RESECTION N/A 02/09/2017   Procedure: XI ROBOTIC RESECTION OF SIGMOID COLON, RIGID PROCTOSCOPY;  Surgeon: Michael Boston, MD;  Location: [facility];  Service: General;  Laterality: N/A;    Family History  Problem Relation Age of Onset   Stroke Mother    Hypertension Mother    Alcohol abuse Mother    Tremor Mother    Heart attack Father 30       deceased   Heart disease Paternal Grandmother        AAA rupture   Heart disease Paternal Grandfather        MI   Obesity Daughter    Colon cancer Neg Hx    Breast cancer Neg Hx    Prostate cancer Neg Hx    Diabetes Neg Hx    Liver cancer Neg Hx    Rectal cancer Neg Hx    Stomach cancer Neg Hx     Social History   Socioeconomic History   Marital status: Married    Spouse name: Not on file   Number of children: 2   Years of education: Not on file   Highest education level: Not on file  Occupational History   Occupation: retired    Comment: banking  Tobacco Use   Smoking status: Former    Packs/day: 0.50    Types: Cigarettes    Quit date: 09/07/2015    Years since quitting: 6.3   Smokeless tobacco: Never  Vaping Use   Vaping Use: Never used  Substance and Sexual Activity   Alcohol use: Yes    Comment: 2 glasses wine/day   Drug use: No   Sexual activity: Yes  Other Topics Concern   Not on file  Social History Narrative   Married, retired   2 children   Former smoker no drug use, does drink alcohol   Right handed   Social Determinants of Radio broadcast assistant Strain: Not on file  Food Insecurity: Not on file  Transportation Needs: Not on file  Physical Activity: Not on file  Stress: Not on file  Social Connections: Not on file  Intimate Partner Violence: Not on file    Outpatient Medications Prior to Visit  Medication Sig Dispense Refill   atorvastatin (LIPITOR) 10 MG tablet TAKE 1/2 TABLET (5 MG TOTAL) BY MOUTH EVERY OTHER DAY. 23 tablet 3   b complex vitamins tablet Take 1  tablet by mouth every other day.     Coenzyme Q10 200 MG capsule Take 200 mg by mouth once a week.  famotidine (PEPCID) 20 MG tablet TAKE 1 TABLET BY MOUTH TWICE A DAY 180 tablet 1   metoprolol succinate (TOPROL-XL) 25 MG 24 hr tablet Take 0.5 tablets (12.5 mg total) by mouth daily. 45 tablet 3   Multiple Vitamin (MULTIVITAMIN WITH MINERALS) TABS tablet Take 1 tablet by mouth every other day.     No facility-administered medications prior to visit.    Allergies  Allergen Reactions   Amoxicillin     REACTION: TROUBLE BREATHING Has patient had a PCN reaction causing immediate rash, facial/tongue/throat swelling, SOB or lightheadedness with hypotension:Yes Has patient had a PCN reaction causing severe rash involving mucus membranes or skin necrosis:No Has patient had a PCN reaction that required hospitalization:No Has patient had a PCN reaction occurring within the last 10 years:No If all of the above answers are "NO", then may proceed with Cephalosporin use.    Proair Hfa [Albuterol] Other (See Comments)    Hypersensitivity, anxious, disoriented   Penicillins Rash    Has patient had a PCN reaction causing immediate rash, facial/tongue/throat swelling, SOB or lightheadedness with hypotension:unknown Has patient had a PCN reaction causing severe rash involving mucus membranes or skin necrosis:unknown Has patient had a PCN reaction that required hospitalization:no Has patient had a PCN reaction occurring within the last 10 years:no If all of the above answers are "NO", then may proceed with Cephalosporin use.     Review of Systems  Constitutional:  Negative for fever and malaise/fatigue.  HENT:  Positive for ear pain (right). Negative for congestion.   Eyes:  Negative for redness.  Respiratory:  Positive for shortness of breath.   Cardiovascular:  Positive for chest pain. Negative for palpitations and leg swelling.  Gastrointestinal:  Negative for abdominal pain, blood in stool and  nausea.  Genitourinary:  Negative for dysuria and frequency.  Musculoskeletal:  Negative for falls.  Skin:  Negative for rash.  Neurological:  Negative for dizziness, loss of consciousness and headaches.       (+) balance issues   Endo/Heme/Allergies:  Negative for polydipsia.  Psychiatric/Behavioral:  Negative for depression. The patient is not nervous/anxious.       Objective:    Physical Exam Constitutional:      General: She is not in acute distress.    Appearance: She is well-developed.  HENT:     Head: Normocephalic and atraumatic.  Eyes:     Conjunctiva/sclera: Conjunctivae normal.  Neck:     Thyroid: No thyromegaly.  Cardiovascular:     Rate and Rhythm: Normal rate and regular rhythm.     Heart sounds: Normal heart sounds. No murmur heard. Pulmonary:     Effort: Pulmonary effort is normal. No respiratory distress.     Breath sounds: Normal breath sounds.  Abdominal:     General: Bowel sounds are normal. There is no distension.     Palpations: Abdomen is soft. There is no mass.     Tenderness: There is no abdominal tenderness.  Musculoskeletal:     Cervical back: Neck supple.  Lymphadenopathy:     Cervical: No cervical adenopathy.  Skin:    General: Skin is warm and dry.  Neurological:     Mental Status: She is alert and oriented to person, place, and time.  Psychiatric:        Behavior: Behavior normal.    BP 138/86 (BP Location: Right Arm, Patient Position: Sitting)   Pulse 74   Resp 20   Ht '5\' 6"'$  (1.676 m)   Wt 214 lb  12.8 oz (97.4 kg)   SpO2 96%   BMI 34.67 kg/m  Wt Readings from Last 3 Encounters:  01/14/22 214 lb 12.8 oz (97.4 kg)  10/21/21 212 lb 9.6 oz (96.4 kg)  09/15/21 211 lb 12.8 oz (96.1 kg)    Diabetic Foot Exam - Simple   No data filed    Lab Results  Component Value Date   WBC 6.0 09/15/2021   HGB 14.8 09/15/2021   HCT 44.4 09/15/2021   PLT 241.0 09/15/2021   GLUCOSE 107 (H) 09/15/2021   CHOL 216 (H) 09/15/2021   TRIG 112.0  09/15/2021   HDL 75.80 09/15/2021   LDLDIRECT 148.6 09/26/2008   LDLCALC 118 (H) 09/15/2021   ALT 20 09/15/2021   AST 18 09/15/2021   NA 137 09/15/2021   K 4.5 09/15/2021   CL 104 09/15/2021   CREATININE 0.70 09/15/2021   BUN 14 09/15/2021   CO2 26 09/15/2021   TSH 1.28 09/15/2021   HGBA1C 5.8 09/15/2021    Lab Results  Component Value Date   TSH 1.28 09/15/2021   Lab Results  Component Value Date   WBC 6.0 09/15/2021   HGB 14.8 09/15/2021   HCT 44.4 09/15/2021   MCV 101.5 (H) 09/15/2021   PLT 241.0 09/15/2021   Lab Results  Component Value Date   NA 137 09/15/2021   K 4.5 09/15/2021   CO2 26 09/15/2021   GLUCOSE 107 (H) 09/15/2021   BUN 14 09/15/2021   CREATININE 0.70 09/15/2021   BILITOT 1.0 09/15/2021   ALKPHOS 84 09/15/2021   AST 18 09/15/2021   ALT 20 09/15/2021   PROT 7.0 09/15/2021   ALBUMIN 4.3 09/15/2021   CALCIUM 9.2 09/15/2021   ANIONGAP 6 02/10/2017   GFR 84.48 09/15/2021   Lab Results  Component Value Date   CHOL 216 (H) 09/15/2021   Lab Results  Component Value Date   HDL 75.80 09/15/2021   Lab Results  Component Value Date   LDLCALC 118 (H) 09/15/2021   Lab Results  Component Value Date   TRIG 112.0 09/15/2021   Lab Results  Component Value Date   CHOLHDL 3 09/15/2021   Lab Results  Component Value Date   HGBA1C 5.8 09/15/2021       Assessment & Plan:   Problem List Items Addressed This Visit     Cholesteatoma of right ear    Follows with ENT and audiology, she notes with flare in allergies her balance and hearing have been off more than usual        Essential hypertension - Primary    Well controlled, no changes to meds. Encouraged heart healthy diet such as the DASH diet and exercise as tolerated.        Relevant Orders   TSH   Lipid panel   Comprehensive metabolic panel   CBC   Ambulatory referral to Cardiology   Allergic rhinitis    Manage prn with antihistamines.        Atypical chest pain   Relevant  Orders   Ambulatory referral to Cardiology   H/O osteopenia    Bone density shows osteopenia, which is thinner than normal but not as bad as osteoporosis. Recommend calcium intake of 1200 to 1500 mg daily, divided into roughly 3 doses. Best source is the diet and a single dairy serving is about 500 mg, a supplement of calcium citrate once or twice daily to balance diet is fine if not getting enough in diet. Also need Vitamin D  2000 IU caps, 1 cap daily if not already taking vitamin D. Also recommend weight baring exercise on hips and upper body to keep bones strong       Hyperglycemia    hgba1c acceptable, minimize simple carbs. Increase exercise as tolerated.       Relevant Orders   Hemoglobin A1c   Ambulatory referral to Cardiology   Tremor, essential    Stable        Hyperlipidemia, mixed    Encourage heart healthy diet such as MIND or DASH diet, increase exercise, avoid trans fats, simple carbohydrates and processed foods, consider a krill or fish or flaxseed oil cap daily.        Relevant Orders   Lipid panel   Ambulatory referral to Cardiology   Osteopenia    Bone density shows osteopenia, which is thinner than normal but not as bad as osteoporosis. Recommend calcium intake of 1200 to 1500 mg daily, divided into roughly 3 doses. Best source is the diet and a single dairy serving is about 500 mg, a supplement of calcium citrate once or twice daily to balance diet is fine if not getting enough in diet. Also need Vitamin D 2000 IU caps, 1 cap daily if not already taking vitamin D. Also recommend weight baring exercise on hips and upper body to keep bones strong       Other Visit Diagnoses     Need for viral immunization       Relevant Orders   Varicella zoster antibody, IgG   Vitamin D deficiency       Relevant Orders   VITAMIN D 25 Hydroxy (Vit-D Deficiency, Fractures)   SOB (shortness of breath)       Relevant Orders   Ambulatory referral to Cardiology        No  orders of the defined types were placed in this encounter.   I,Zite Okoli,acting as a Education administrator for Penni Homans, MD.,have documented all relevant documentation on the behalf of Penni Homans, MD,as directed by  Penni Homans, MD while in the presence of Penni Homans, MD.   I, [name], MD. , personally preformed the services described in this documentation.  All medical record entries made by the scribe were at my direction and in my presence.  I have reviewed the chart and discharge instructions (if applicable) and agree that the record reflects my personal performance and is accurate and complete. 01/14/2022

## 2022-01-14 NOTE — Assessment & Plan Note (Signed)
Well controlled, no changes to meds. Encouraged heart healthy diet such as the DASH diet and exercise as tolerated.  °

## 2022-01-14 NOTE — Assessment & Plan Note (Signed)
Encourage heart healthy diet such as MIND or DASH diet, increase exercise, avoid trans fats, simple carbohydrates and processed foods, consider a krill or fish or flaxseed oil cap daily.  °

## 2022-01-14 NOTE — Assessment & Plan Note (Signed)
Follows with ENT and audiology, she notes with flare in allergies her balance and hearing have been off more than usual

## 2022-01-14 NOTE — Patient Instructions (Signed)
Tremor A tremor is trembling or shaking that you cannot control. Most tremors affect the hands or arms. Tremors can also affect the head, vocal cords, face, and other parts of the body. There are many types of tremors. Common types include: Essential tremor. These usually occur in people older than 40. This type of tremor may run in families and can happen in otherwise healthy people. Resting tremor. These occur when the muscles are at rest, such as when your hands are resting in your lap. People with Parkinson's disease often have resting tremors. Postural tremor. These occur when you try to hold a pose, such as keeping your hands outstretched. Kinetic tremor. These occur during purposeful movement, such as trying to touch a finger to your nose. Task-specific tremor. These may occur when you do certain tasks such as writing, speaking, or standing. Psychogenic tremor. These are greatly reduced or go away when you are distracted. These tremors happen due to underlying stress or psychiatric disease. They can happen in people of all ages. Some types of tremors have no known cause. Tremors can also be a symptom of nervous system problems (neurological disorders) that may occur with aging. Some tremors go away with treatment, while others do not. Follow these instructions at home: Lifestyle     If you drink alcohol: Limit how much you have to: 0-1 drink a day for women who are not pregnant. 0-2 drinks a day for men. Know how much alcohol is in a drink. In the [place] one drink equals one 12 oz bottle of beer (355 mL), one 5 oz glass of wine (148 mL), or one 1 oz glass of hard liquor (44 mL). Do not use any products that contain nicotine or tobacco. These products include cigarettes, chewing tobacco, and vaping devices, such as e-cigarettes. If you need help quitting, ask your health care provider. Avoid extreme heat and extreme cold. Limit your caffeine intake, as told by your health care  provider. Try to get 8 hours of sleep each night. Find ways to manage your stress, such as meditation or yoga. General instructions Take over-the-counter and prescription medicines only as told by your health care provider. Keep all follow-up visits. This is important. Contact a health care provider if: You develop a tremor after starting a new medicine. You have a tremor along with other symptoms such as: Numbness. Tingling. Pain. Weakness. Your tremor gets worse. Your tremor interferes with your day-to-day life. Summary A tremor is trembling or shaking that you cannot control. Most tremors affect the hands or arms. Some types of tremors have no known cause. Others may be a symptom of nervous system problems (neurological disorders). Make sure you discuss any tremors you have with your health care provider. This information is not intended to replace advice given to you by your health care provider. Make sure you discuss any questions you have with your health care provider. Document Revised: 05/29/2021 Document Reviewed: 05/29/2021 Elsevier Patient Education  2023 [employer]  

## 2022-01-15 ENCOUNTER — Other Ambulatory Visit: Payer: Self-pay

## 2022-01-15 ENCOUNTER — Encounter: Payer: Self-pay | Admitting: Family Medicine

## 2022-01-15 LAB — VARICELLA ZOSTER ANTIBODY, IGG: Varicella IgG: 2781 index

## 2022-01-15 MED ORDER — VITAMIN D (ERGOCALCIFEROL) 1.25 MG (50000 UNIT) PO CAPS: 50000.0000 [IU] | ORAL_CAPSULE | ORAL | 1 refills | Status: DC

## 2022-01-15 MED ORDER — ATORVASTATIN CALCIUM 10 MG PO TABS: ORAL_TABLET | ORAL | 1 refills | Status: DC

## 2022-01-15 NOTE — Addendum Note (Signed)
Addended by: Lynnea Ferrier R on: 01/15/2022 12:47 PM   Modules accepted: Orders

## 2022-01-19 ENCOUNTER — Ambulatory Visit (INDEPENDENT_AMBULATORY_CARE_PROVIDER_SITE_OTHER): Payer: Medicare Other

## 2022-01-19 DIAGNOSIS — Z0000 Encounter for general adult medical examination without abnormal findings: Secondary | ICD-10-CM

## 2022-01-19 NOTE — Patient Instructions (Signed)
Renee [name] , Thank you for taking time to come for your Medicare Wellness Visit. I appreciate your ongoing commitment to your health goals. Please review the following plan we discussed and let me know if I can assist you in the future.   Screening recommendations/referrals: Colonoscopy: 01/17/18 due 01/18/23 Mammogram: 08/11/21 due 08/11/22 Bone Density: 08/11/21 due 08/12/23 Recommended yearly ophthalmology/optometry visit for glaucoma screening and checkup Recommended yearly dental visit for hygiene and checkup  Vaccinations: Influenza vaccine: declined Pneumococcal vaccine: up to date Tdap vaccine: declined Shingles vaccine: Due-May obtain vaccine at your local pharmacy.    Covid-19:declined  Advanced directives: yes, on file  Conditions/risks identified: see problem list   Next appointment: Follow up in one year for your annual wellness visit    Preventive Care 65 Years and Older, Female Preventive care refers to lifestyle choices and visits with your health care provider that can promote health and wellness. What does preventive care include? A yearly physical exam. This is also called an annual well check. Dental exams once or twice a year. Routine eye exams. Ask your health care provider how often you should have your eyes checked. Personal lifestyle choices, including: Daily care of your teeth and gums. Regular physical activity. Eating a healthy diet. Avoiding tobacco and drug use. Limiting alcohol use. Practicing safe sex. Taking low-dose aspirin every day. Taking vitamin and mineral supplements as recommended by your health care provider. What happens during an annual well check? The services and screenings done by your health care provider during your annual well check will depend on your age, overall health, lifestyle risk factors, and family history of disease. Counseling  Your health care provider may ask you questions about your: Alcohol use. Tobacco  use. Drug use. Emotional well-being. Home and relationship well-being. Sexual activity. Eating habits. History of falls. Memory and ability to understand (cognition). Work and work Statistician. Reproductive health. Screening  You may have the following tests or measurements: Height, weight, and BMI. Blood pressure. Lipid and cholesterol levels. These may be checked every 5 years, or more frequently if you are over 8 years old. Skin check. Lung cancer screening. You may have this screening every year starting at age 64 if you have a 30-pack-year history of smoking and currently smoke or have quit within the past 15 years. Fecal occult blood test (FOBT) of the stool. You may have this test every year starting at age 35. Flexible sigmoidoscopy or colonoscopy. You may have a sigmoidoscopy every 5 years or a colonoscopy every 10 years starting at age 65. Hepatitis C blood test. Hepatitis B blood test. Sexually transmitted disease (STD) testing. Diabetes screening. This is done by checking your blood sugar (glucose) after you have not eaten for a while (fasting). You may have this done every 1-3 years. Bone density scan. This is done to screen for osteoporosis. You may have this done starting at age 47. Mammogram. This may be done every 1-2 years. Talk to your health care provider about how often you should have regular mammograms. Talk with your health care provider about your test results, treatment options, and if necessary, the need for more tests. Vaccines  Your health care provider may recommend certain vaccines, such as: Influenza vaccine. This is recommended every year. Tetanus, diphtheria, and acellular pertussis (Tdap, Td) vaccine. You may need a Td booster every 10 years. Zoster vaccine. You may need this after age 62. Pneumococcal 13-valent conjugate (PCV13) vaccine. One dose is recommended after age 68. Pneumococcal polysaccharide (PPSV23)  vaccine. One dose is recommended  after age 74. Talk to your health care provider about which screenings and vaccines you need and how often you need them. This information is not intended to replace advice given to you by your health care provider. Make sure you discuss any questions you have with your health care provider. Document Released: 09/05/2015 Document Revised: 04/28/2016 Document Reviewed: 06/10/2015 Elsevier Interactive Patient Education  2017 [place] Prevention in the Home Falls can cause injuries. They can happen to people of all ages. There are many things you can do to make your home safe and to help prevent falls. What can I do on the outside of my home? Regularly fix the edges of walkways and driveways and fix any cracks. Remove anything that might make you trip as you walk through a door, such as a raised step or threshold. Trim any bushes or trees on the path to your home. Use bright outdoor lighting. Clear any walking paths of anything that might make someone trip, such as rocks or tools. Regularly check to see if handrails are loose or broken. Make sure that both sides of any steps have handrails. Any raised decks and porches should have guardrails on the edges. Have any leaves, snow, or ice cleared regularly. Use sand or salt on walking paths during winter. Clean up any spills in your garage right away. This includes oil or grease spills. What can I do in the bathroom? Use night lights. Install grab bars by the toilet and in the tub and shower. Do not use towel bars as grab bars. Use non-skid mats or decals in the tub or shower. If you need to sit down in the shower, use a plastic, non-slip stool. Keep the floor dry. Clean up any water that spills on the floor as soon as it happens. Remove soap buildup in the tub or shower regularly. Attach bath mats securely with double-sided non-slip rug tape. Do not have throw rugs and other things on the floor that can make you trip. What can I do  in the bedroom? Use night lights. Make sure that you have a light by your bed that is easy to reach. Do not use any sheets or blankets that are too big for your bed. They should not hang down onto the floor. Have a firm chair that has side arms. You can use this for support while you get dressed. Do not have throw rugs and other things on the floor that can make you trip. What can I do in the kitchen? Clean up any spills right away. Avoid walking on wet floors. Keep items that you use a lot in easy-to-reach places. If you need to reach something above you, use a strong step stool that has a grab bar. Keep electrical cords out of the way. Do not use floor polish or wax that makes floors slippery. If you must use wax, use non-skid floor wax. Do not have throw rugs and other things on the floor that can make you trip. What can I do with my stairs? Do not leave any items on the stairs. Make sure that there are handrails on both sides of the stairs and use them. Fix handrails that are broken or loose. Make sure that handrails are as long as the stairways. Check any carpeting to make sure that it is firmly attached to the stairs. Fix any carpet that is loose or worn. Avoid having throw rugs at the top or bottom  of the stairs. If you do have throw rugs, attach them to the floor with carpet tape. Make sure that you have a light switch at the top of the stairs and the bottom of the stairs. If you do not have them, ask someone to add them for you. What else can I do to help prevent falls? Wear shoes that: Do not have high heels. Have rubber bottoms. Are comfortable and fit you well. Are closed at the toe. Do not wear sandals. If you use a stepladder: Make sure that it is fully opened. Do not climb a closed stepladder. Make sure that both sides of the stepladder are locked into place. Ask someone to hold it for you, if possible. Clearly mark and make sure that you can see: Any grab bars or  handrails. First and last steps. Where the edge of each step is. Use tools that help you move around (mobility aids) if they are needed. These include: Canes. Walkers. Scooters. Crutches. Turn on the lights when you go into a dark area. Replace any light bulbs as soon as they burn out. Set up your furniture so you have a clear path. Avoid moving your furniture around. If any of your floors are uneven, fix them. If there are any pets around you, be aware of where they are. Review your medicines with your doctor. Some medicines can make you feel dizzy. This can increase your chance of falling. Ask your doctor what other things that you can do to help prevent falls. This information is not intended to replace advice given to you by your health care provider. Make sure you discuss any questions you have with your health care provider. Document Released: 06/05/2009 Document Revised: 01/15/2016 Document Reviewed: 09/13/2014 Elsevier Interactive Patient Education  2017 [employer].

## 2022-01-19 NOTE — Progress Notes (Signed)
Subjective:   Renee [name] is a 76 y.o. female who presents for Medicare Annual (Subsequent) preventive examination.  I connected with  [name] on 01/19/22 by a audio enabled telemedicine application and verified that I am speaking with the correct person using two identifiers.  Patient Location: Home  Provider Location: Office/Clinic  I discussed the limitations of evaluation and management by telemedicine. The patient expressed understanding and agreed to proceed.   Review of Systems     Cardiac Risk Factors include: advanced age (>8mn, >>39women);hypertension;dyslipidemia     Objective:    There were no vitals filed for this visit. There is no height or weight on file to calculate BMI.     01/19/2022    1:58 PM 10/21/2021    8:37 AM 11/10/2020   10:48 AM 03/07/2020   11:54 AM 07/14/2019    2:18 PM 08/29/2018    8:22 AM 01/17/2018    7:19 AM  Advanced Directives  Does Patient Have a Medical Advance Directive? Yes Yes No Yes No Yes No  Type of [place] (pink MOST or yellow form);Living will   [place]  [place]   Does patient want to make changes to medical advance directive? No - Patient declined   No - Patient declined  No - Patient declined   Copy of [place]? Yes - validated most recent copy scanned in chart (See row information)   Yes - validated most recent copy scanned in chart (See row information)  Yes - validated most recent copy scanned in chart (See row information)   Would patient like information on creating a medical advance directive?   No - Patient declined    No - Patient declined    Current Medications (verified) Outpatient Encounter Medications as of 01/19/2022  Medication Sig   atorvastatin (LIPITOR) 10 MG tablet TAKE 1/2 TABLET (5 MG TOTAL) BY MOUTH EVERY DAY AT BEDTIME.   b complex vitamins tablet  Take 1 tablet by mouth every other day.   Coenzyme Q10 200 MG capsule Take 200 mg by mouth once a week.    famotidine (PEPCID) 20 MG tablet TAKE 1 TABLET BY MOUTH TWICE A DAY   metoprolol succinate (TOPROL-XL) 25 MG 24 hr tablet Take 0.5 tablets (12.5 mg total) by mouth daily.   Multiple Vitamin (MULTIVITAMIN WITH MINERALS) TABS tablet Take 1 tablet by mouth every other day.   Vitamin D, Ergocalciferol, (DRISDOL) 1.25 MG (50000 UNIT) CAPS capsule Take 1 capsule (50,000 Units total) by mouth every 7 (seven) days.   No facility-administered encounter medications on file as of 01/19/2022.    Allergies (verified) Amoxicillin, Proair hfa [albuterol], and Penicillins   History: Past Medical History:  Diagnosis Date   Abdominal pain 07/05/2017   ABNORMAL INVOLUNTARY MOVEMENTS 01/01/2009   Qualifier: Diagnosis of  By: HLinna DarnerMD, William     Allergic rhinitis    Allergic rhinitis 09/19/2008   Qualifier: Diagnosis of  By: YWynona Luna   Allergy    Arthralgia of right wrist 05/06/2020   Atypical chest pain 09/19/2008   Qualifier: Diagnosis of  By: YWynona Luna   Cervical cancer screening 05/08/2013   Menarche 11, regular Menopause [id number] s/p 2 svd No abnormal paps or MGM Fatty cyst on labia removed.    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   Qualifier: Diagnosis of  By: Yoo DO, [place]   Diverticulosis 05/06/2017   Essential hypertension 09/19/2008   Qualifier: Diagnosis of  By: Wynona Luna    GERD (gastroesophageal reflux disease)    H/O osteopenia 02/08/2016   H/O tobacco use, presenting hazards to health 09/19/2008   Qualifier: Diagnosis of  By: Wynona Luna Smoking roughly 5 cig daily  Last cigarette was in December of 2016   Head tremor 12/27/2016   Hearing loss in right ear 09/19/2008   Qualifier: Diagnosis of  By: Wynona Luna    Hx of  adenomatous colonic polyps 01/27/2018   Hyperglycemia 10/07/2016   Hyperlipidemia    Hyperlipidemia, mixed 10/03/2008   Qualifier: Diagnosis of  By: Wynona Luna    Hypertension    Neuromuscular disorder [facility])    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 Fixed sigmoid colon prevents. Referred for surgery.   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 02/09/2017   Tobacco abuse    Tremor, essential    TREMOR, ESSENTIAL 09/19/2008   Qualifier: Diagnosis of  By: Wynona Luna    Vertigo    Vertigo    Past Surgical History:  Procedure Laterality Date   INNER EAR SURGERY  2015   with tube   MOUTH SURGERY  01/22/11   lower denture secured by titanium bolts   rectal suction biospy     XI ROBOTIC ASSISTED LOWER ANTERIOR RESECTION N/A 02/09/2017   Procedure: XI ROBOTIC RESECTION OF SIGMOID COLON, RIGID PROCTOSCOPY;  Surgeon: Michael Boston, MD;  Location: [facility];  Service: General;  Laterality: N/A;   Family History  Problem Relation Age of Onset   Stroke Mother    Hypertension Mother    Alcohol abuse Mother    Tremor Mother    Heart attack Father 48       deceased   Heart disease Paternal Grandmother        AAA rupture   Heart disease Paternal Grandfather        MI   Obesity Daughter    Colon cancer Neg Hx    Breast cancer Neg Hx    Prostate cancer Neg Hx    Diabetes Neg Hx    Liver cancer Neg Hx     Rectal cancer Neg Hx    Stomach cancer Neg Hx    Social History   Socioeconomic History   Marital status: Married    Spouse name: Not on file   Number of children: 2   Years of education: Not on file   Highest education level: Not on file  Occupational History   Occupation: retired    Comment: Science writer  Tobacco Use   Smoking status: Former    Packs/day: 0.50    Types: Cigarettes    Quit date: 09/07/2015    Years since quitting: 6.3   Smokeless tobacco: Never  Vaping Use   Vaping Use: Never used  Substance and Sexual Activity   Alcohol use: Yes    Comment: 2 glasses wine/day   Drug use: No   Sexual activity: Yes  Other Topics Concern   Not on file  Social History Narrative   Married, retired   2 children   Former smoker no drug use, does drink alcohol   Right handed   Social Determinants of Radio broadcast assistant Strain: Low Risk    Difficulty of Paying Living Expenses: Not hard at all  Food Insecurity: No Food Insecurity   Worried About Charity fundraiser in the Last Year: Never true   Arboriculturist in the Last Year: Never true  Transportation Needs: No Transportation Needs   Lack of Transportation (Medical): No   Lack of Transportation (Non-Medical): No  Physical Activity: Sufficiently Active   Days of Exercise per Week: 7 days   Minutes of Exercise per Session: 30 min  Stress: No Stress Concern Present   Feeling of Stress : Not at all  Social Connections: Socially Integrated   Frequency of Communication with Friends and Family: More than three times a week   Frequency of Social Gatherings with Friends and Family: Once a week   Attends Religious Services: More than 4 times per year   Active Member of [employer] or Organizations: Yes   Attends Music therapist: More than 4 times per year   Marital Status: Married    Tobacco Counseling Counseling given: Not Answered   Clinical Intake:  Pre-visit preparation completed: Yes  Pain :  No/denies pain     Nutritional Risks: None Diabetes: No  How often do you need to have someone help you when you read instructions, pamphlets, or other written materials from your doctor or pharmacy?: 1 - Never  Diabetic?No     Information entered by :: [place] of Daily Living    01/19/2022    2:00 PM 05/14/2021    9:26 AM  In your present state of health, do you have any difficulty performing the following activities:  Hearing? 1 1  Comment surgery on right has appt with audiologist  Vision? 0 0  Difficulty concentrating or making decisions? 0 0  Walking or climbing stairs? 0 0  Dressing or bathing? 0 0  Doing errands, shopping? 0 0  Preparing Food and eating ? N   Using the Toilet? N   In the past six months, have you accidently leaked urine? Y   Do you have problems with loss of bowel control? Y   Managing your Medications? N   Managing your Finances? N   Housekeeping or managing your Housekeeping? N     Patient Care Team: [name], MD as PCP - General (Family Medicine) Melissa Montane, MD as Consulting Physician (Otolaryngology) Gatha Mayer, MD as Consulting Physician (Gastroenterology) Dr Shanon Brow Daughtry (Optometry) Michael Boston, MD as Consulting Physician (General Surgery) Love, Alyson Locket, MD as Consulting Physician (Neurology)  Indicate any recent Medical Services you may have received from other than [facility] providers in the past year (date may be approximate).     Assessment:   This is a routine wellness examination for Meno.  Hearing/Vision screen No results found.  Dietary issues and exercise activities discussed: Current Exercise Habits: Home exercise routine, Type of exercise: Other - see comments, Time (Minutes): 30, Frequency (Times/Week): 7, Weekly Exercise (Minutes/Week): 210, Intensity: Mild, Exercise limited by: Other - see comments;None identified;cardiac condition(s)   Goals Addressed             This Visit's  Progress    Increase water intake   On track    Reduce alcohol intake    On track    Reduce sodium intake   Not on track    DASH Eating Plan DASH stands for "Dietary Approaches to Stop Hypertension." The DASH eating plan is a healthy eating plan that has been shown to reduce high blood pressure (hypertension). Additional health benefits may include reducing the risk of type 2 diabetes mellitus, heart disease, and stroke. The DASH eating plan may also help with weight loss. What do I need to know about the DASH eating plan? For the DASH eating plan, you will follow these general guidelines: Choose foods with less than 150 milligrams of sodium per serving (as listed on the food label). Use salt-free seasonings or herbs instead of table salt or sea salt. Check with your health care provider or pharmacist before using salt substitutes. Eat lower-sodium products. These are often labeled as "low-sodium" or "no salt added." Eat fresh foods. Avoid eating a lot of canned foods. Eat more vegetables, fruits, and low-fat dairy products. Choose whole grains. Look for the word "whole" as the first word in the ingredient list. Choose fish and skinless chicken or [place] more often than red meat. Limit fish, poultry, and meat to 6 oz (170 g) each day. Limit sweets, desserts, sugars, and sugary drinks. Choose heart-healthy fats. Eat more home-cooked food and less restaurant, buffet, and fast food. Limit fried foods. Do not fry foods. Cook foods using methods such as baking, boiling, grilling, and broiling instead. When eating at a restaurant, ask that your food be prepared with less salt, or no salt if possible. What foods can I eat? Seek help from a dietitian for individual calorie needs. Grains  Whole grain or whole wheat bread. Brown rice. Whole grain or whole wheat pasta. Quinoa, bulgur, and whole grain cereals. Low-sodium cereals. Corn or whole wheat flour tortillas. Whole grain cornbread. Whole grain  crackers. Low-sodium crackers. Vegetables  Fresh or frozen vegetables (raw, steamed, roasted, or grilled). Low-sodium or reduced-sodium tomato and vegetable juices. Low-sodium or reduced-sodium tomato sauce and paste. Low-sodium or reduced-sodium canned vegetables. Fruits  All fresh, canned (in natural juice), or frozen fruits. Meat and Other Protein Products  Ground beef (85% or leaner), grass-fed beef, or beef trimmed of fat. Skinless chicken or [place]. Ground chicken or [place]. Pork trimmed of fat. All fish and seafood. Eggs. Dried beans, peas, or lentils. Unsalted nuts and seeds. Unsalted canned beans. Dairy  Low-fat dairy products, such as skim or 1% milk, 2% or reduced-fat cheeses, low-fat ricotta or cottage cheese, or plain low-fat yogurt. Low-sodium or reduced-sodium cheeses. Fats and Oils  Tub margarines without trans fats. Light or reduced-fat mayonnaise and salad dressings (reduced sodium). Avocado. Safflower, olive, or canola oils. Natural peanut or almond butter. Other  Unsalted popcorn and pretzels. The items listed above may not be a complete list of recommended foods or beverages. Contact your dietitian for more options.  What foods are not recommended? Grains  White bread. White pasta. White rice. Refined cornbread. Bagels and croissants. Crackers that contain trans fat. Vegetables  Creamed or fried vegetables.  Vegetables in a cheese sauce. Regular canned vegetables. Regular canned tomato sauce and paste. Regular tomato and vegetable juices. Fruits  Canned fruit in light or heavy syrup. Fruit juice. Meat and Other Protein Products  Fatty cuts of meat. Ribs, chicken wings, bacon, sausage, bologna, salami, chitterlings, fatback, hot dogs, bratwurst, and packaged luncheon meats. Salted nuts and seeds. Canned beans with salt. Dairy  Whole or 2% milk, cream, half-and-half, and cream cheese. Whole-fat or sweetened yogurt. Full-fat cheeses or blue cheese. Nondairy creamers and  whipped toppings. Processed cheese, cheese spreads, or cheese curds. Condiments  Onion and garlic salt, seasoned salt, table salt, and sea salt. Canned and packaged gravies. Worcestershire sauce. Tartar sauce. Barbecue sauce. Teriyaki sauce. Soy sauce, including reduced sodium. Steak sauce. Fish sauce. Oyster sauce. Cocktail sauce. Horseradish. Ketchup and mustard. Meat flavorings and tenderizers. Bouillon cubes. Hot sauce. Tabasco sauce. Marinades. Taco seasonings. Relishes. Fats and Oils  Butter, stick margarine, lard, shortening, ghee, and bacon fat. Coconut, palm kernel, or palm oils. Regular salad dressings. Other  Pickles and olives. Salted popcorn and pretzels. The items listed above may not be a complete list of foods and beverages to avoid. Contact your dietitian for more information.  Where can I find more information? National Heart, Lung, and Blood Institute: travelstabloid.com This information is not intended to replace advice given to you by your health care provider. Make sure you discuss any questions you have with your health care provider. Document Released: 07/29/2011 Document Revised: 01/15/2016 Document Reviewed: 06/13/2013 Elsevier Interactive Patient Education  2017 [place]    01/19/2022    1:59 PM 01/14/2022    9:43 AM 05/14/2021    9:27 AM 10/23/2020    9:11 AM 08/29/2018    8:23 AM 06/29/2018    8:26 AM 10/07/2016   11:01 AM  PHQ 2/9 Scores  PHQ - 2 Score 0 0 0 0 0 0 0  PHQ- 9 Score      0     Fall Risk    01/19/2022    1:59 PM 10/21/2021    8:37 AM 05/14/2021    9:26 AM 03/07/2020   12:04 PM 08/29/2018    8:23 AM  [place] in the past year? 0 0 1 0 0  Number falls in past yr: 0 0 0 0   Injury with Fall? 0 0 0 0   Risk for fall due to : No Fall Risks      Follow up Falls evaluation completed   Education provided;Falls prevention discussed     FALL RISK PREVENTION PERTAINING TO THE  HOME:  Any stairs in or around the home? Yes  If so, are there any without handrails? No  Home free of loose throw rugs in walkways, pet beds, electrical cords, etc? Yes  Adequate lighting in your home to reduce risk of falls? Yes   ASSISTIVE DEVICES UTILIZED TO PREVENT FALLS:  Life alert? No  Use of a cane, walker or w/c? Yes  sometimes Grab bars in the bathroom? Yes  Shower chair or bench in shower? No  Elevated toilet seat or a handicapped toilet? Yes   TIMED UP AND GO:  Was the test performed? No .    Cognitive Function:    10/07/2016   11:02 AM 10/03/2015    2:50 PM  MMSE - Mini Mental State Exam  Orientation to time 5 5  Orientation to Place 5 5  Registration  3 3  Attention/ Calculation 5 5  Recall 3 3  Language- name 2 objects 2 2  Language- repeat 1 1  Language- follow 3 step command 3 3  Language- read & follow direction 1 1  Write a sentence 1 1  Copy design 1 1  Total score 30 30        01/19/2022    2:07 PM  6CIT Screen  What Year? 0 points  What month? 0 points  What time? 0 points  Count back from 20 0 points  Months in reverse 0 points  Repeat phrase 0 points  Total Score 0 points    Immunizations Immunization History  Administered Date(s) Administered   PFIZER(Purple Top)SARS-COV-2 Vaccination 10/01/2019, 10/24/2019, 06/26/2020   Pneumococcal Conjugate-13 07/06/2016   Pneumococcal Polysaccharide-23 04/24/2020    TDAP status: Due, Education has been provided regarding the importance of this vaccine. Advised may receive this vaccine at local pharmacy or Health Dept. Aware to provide a copy of the vaccination record if obtained from local pharmacy or Health Dept. Verbalized acceptance and understanding.  Flu Vaccine status: Declined, Education has been provided regarding the importance of this vaccine but patient still declined. Advised may receive this vaccine at local pharmacy or Health Dept. Aware to provide a copy of the vaccination record  if obtained from local pharmacy or Health Dept. Verbalized acceptance and understanding.  Pneumococcal vaccine status: Up to date  Covid-19 vaccine status: Declined, Education has been provided regarding the importance of this vaccine but patient still declined. Advised may receive this vaccine at local pharmacy or Health Dept.or vaccine clinic. Aware to provide a copy of the vaccination record if obtained from local pharmacy or Health Dept. Verbalized acceptance and understanding.  Qualifies for Shingles Vaccine? Yes   Zostavax completed No   Shingrix Completed?: No.    Education has been provided regarding the importance of this vaccine. Patient has been advised to call insurance company to determine out of pocket expense if they have not yet received this vaccine. Advised may also receive vaccine at local pharmacy or Health Dept. Verbalized acceptance and understanding.  Screening Tests Health Maintenance  Topic Date Due   Hepatitis C Screening  Never done   TETANUS/TDAP  Never done   Zoster Vaccines- Shingrix (1 of 2) Never done   COLONOSCOPY (Pts 45-93yr Insurance coverage will need to be confirmed)  01/18/2023   Pneumonia Vaccine 76 Years old  Completed   DEXA SCAN  Completed   HPV VACCINES  Aged Out   INFLUENZA VACCINE  Discontinued   COVID-19 Vaccine  Discontinued    Health Maintenance  Health Maintenance Due  Topic Date Due   Hepatitis C Screening  Never done   TETANUS/TDAP  Never done   Zoster Vaccines- Shingrix (1 of 2) Never done    Colorectal cancer screening: Type of screening: Colonoscopy. Completed 01/17/18. Repeat every 5 years  Mammogram status: Completed 08/11/21. Repeat every year  Bone Density status: Completed 08/11/21. Results reflect: Bone density results: OSTEOPENIA. Repeat every 2 years.  Lung Cancer Screening: (Low Dose CT Chest recommended if Age 76-80years, 30 pack-year currently smoking OR have quit w/in 15years.) does not qualify.   Lung  Cancer Screening Referral: N/A  Additional Screening:  Hepatitis C Screening: does qualify; Completed N/A  Vision Screening: Recommended annual ophthalmology exams for early detection of glaucoma and other disorders of the eye. Is the patient up to date with their annual eye exam?  Yes  Who is the  provider or what is the name of the office in which the patient attends annual eye exams? Unsure If pt is not established with a provider, would they like to be referred to a provider to establish care? No .   Dental Screening: Recommended annual dental exams for proper oral hygiene  Community Resource Referral / Chronic Care Management: CRR required this visit?  No   CCM required this visit?  No      Plan:     I have personally reviewed and noted the following in the patient's chart:   Medical and social history Use of alcohol, tobacco or illicit drugs  Current medications and supplements including opioid prescriptions.  Functional ability and status Nutritional status Physical activity Advanced directives List of other physicians Hospitalizations, surgeries, and ER visits in previous 12 months Vitals Screenings to include cognitive, depression, and falls Referrals and appointments  In addition, I have reviewed and discussed with patient certain preventive protocols, quality metrics, and best practice recommendations. A written personalized care plan for preventive services as well as general preventive health recommendations were provided to patient.   Due to this being a telephonic visit, the after visit summary with patients personalized plan was offered to patient via mail or my-chart.  Patient would like to access on my-chart.    Duard Brady Hollin Crewe, [place]   01/19/2022   Nurse Notes: None

## 2022-02-03 ENCOUNTER — Other Ambulatory Visit: Payer: Self-pay | Admitting: Family Medicine

## 2022-03-31 ENCOUNTER — Ambulatory Visit: Payer: Medicare Other | Admitting: Cardiology

## 2022-03-31 ENCOUNTER — Encounter: Payer: Self-pay | Admitting: Cardiology

## 2022-03-31 VITALS — BP 132/80 | HR 81 | Ht 66.0 in | Wt 220.0 lb

## 2022-03-31 DIAGNOSIS — R002 Palpitations: Secondary | ICD-10-CM | POA: Diagnosis not present

## 2022-03-31 DIAGNOSIS — E782 Mixed hyperlipidemia: Secondary | ICD-10-CM

## 2022-03-31 DIAGNOSIS — I493 Ventricular premature depolarization: Secondary | ICD-10-CM | POA: Diagnosis not present

## 2022-03-31 DIAGNOSIS — I10 Essential (primary) hypertension: Secondary | ICD-10-CM | POA: Diagnosis not present

## 2022-03-31 DIAGNOSIS — R079 Chest pain, unspecified: Secondary | ICD-10-CM

## 2022-03-31 MED ORDER — PRAVASTATIN SODIUM 20 MG PO TABS: 20.0000 mg | ORAL_TABLET | Freq: Every evening | ORAL | 3 refills | Status: DC

## 2022-03-31 MED ORDER — METOPROLOL SUCCINATE ER 25 MG PO TB24: 12.5000 mg | ORAL_TABLET | Freq: Every day | ORAL | 3 refills | Status: DC

## 2022-03-31 NOTE — Progress Notes (Unsigned)
Cardiology Office Note:    Date:  03/31/2022   ID:  Renee [name], DOB 31-Jul-1946, MRN [id number]  PCP:  [name], MD  Cardiologist:  Shirlee More, MD    Referring MD: [name], MD    ASSESSMENT:    1. Palpitations   2. PVC's (premature ventricular contractions)   3. Chest pain of uncertain etiology   4. Primary hypertension   5. Hyperlipidemia, mixed    PLAN:    In order of problems listed above:  She is having infrequent awareness of her heart is seems to be more rapid than extrasystoles the rate when she tracks is the range of 111 I do not think it happens frequently enough that we can capture an event monitor but if it was more frequent we could certainly apply 1 our office and I told her she could take an extra tablet of metoprolol if needed each day. No further chest pain she will continue her beta-blocker which is done a good job with her PVCs BP at target continue her beta-blocker Poorly statin intolerant switch to pravastatin daily if LDL remains greater than 100 we could add Zetia and if statin intolerant bempedoic acid is a good choice She would like to lose weight BMI exceeds 35 I asked her to discuss with her PCP what is injectable GLP-1 agents which I think would be helpful   Next appointment: 1 year   Medication Adjustments/Labs and Tests Ordered: Current medicines are reviewed at length with the patient today.  Concerns regarding medicines are outlined above.  No orders of the defined types were placed in this encounter.  No orders of the defined types were placed in this encounter.   Chief Complaint  Patient presents with   Follow-up  She is concerned because she has had fast heart rate  History of Present Illness:    Renee [name] is a 76 y.o. female with a hx of hypertension hyperlipidemia and chest pain with a normal stress echocardiogram last seen 07/02/2020.  Compliance with diet, lifestyle and medications: Yes  She has had no  further chest pain takes a low-dose beta-blocker. She has trouble tolerating Lipitor LDL above target and will switch to pravastatin if LDL remains greater than 100 and Zetia.  If statin intolerant bempedoic acid She has had a few episodes where she feels her heart beating quickly she put a pulse meter on and a rate was 111 bpm.  We discussed using an event monitor but the episodes are so infrequent I do not think it be helpful she does not have a smart watch and I told her she can track her heart rates with the sat monitor and take an extra dose of beta-blocker daily if needed No edema orthopnea shortness of breath or syncope Recent labs 01/14/2022 LDL 139 A1c 5.9% hemoglobin 15.2 creatinine 0.73 potassium 4.7 Past Medical History:  Diagnosis Date   Abdominal pain 07/05/2017   ABNORMAL INVOLUNTARY MOVEMENTS 01/01/2009   Qualifier: Diagnosis of  By: Linna Darner MD, William     Allergic rhinitis 09/19/2008   Qualifier: Diagnosis of  By: Wynona Luna    Allergy    Arthralgia of right wrist 05/06/2020   Atypical chest pain 09/19/2008   Qualifier: Diagnosis of  By: Wynona Luna    Cervical cancer screening 05/08/2013   Menarche 11, regular Menopause 64s G2P2 s/p 2 svd No abnormal paps or MGM Fatty cyst on labia removed.    Cervical pain (  neck) 10/07/2015   Change in bowel habits 05/14/2021   CHEST PAIN 09/19/2008   Qualifier: Diagnosis of  By: Wynona Luna    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   Qualifier: Diagnosis of  By: Wynona Luna    Dermatitis 09/17/2018   Diverticulosis 05/06/2017   Essential hypertension 09/19/2008   Qualifier: Diagnosis of  By: Wynona Luna    GERD (gastroesophageal reflux disease)    H/O osteopenia 02/08/2016   H/O tobacco use, presenting hazards to health 09/19/2008   Qualifier: Diagnosis of  By: Wynona Luna Smoking roughly 5 cig daily  Last cigarette was in December of 2016   Head tremor 12/27/2016   Hearing loss in right ear  09/19/2008   Qualifier: Diagnosis of  By: Wynona Luna    Hx of adenomatous colonic polyps 01/27/2018   Hyperglycemia 10/07/2016   Hyperlipidemia, mixed 10/03/2008   Qualifier: Diagnosis of  By: Wynona Luna    Lumbar radiculopathy 07/06/2021   Neuromuscular disorder ([place])    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 Fixed sigmoid colon prevents. Referred for surgery.   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 02/09/2017   Tinnitus 12/12/2017   Tremor, essential    Vertigo    Vertigo     Past Surgical History:  Procedure Laterality Date   INNER EAR SURGERY  2015   with tube   MOUTH SURGERY  01/22/11   lower denture secured by titanium bolts   rectal suction biospy     XI ROBOTIC ASSISTED LOWER ANTERIOR RESECTION N/A 02/09/2017   Procedure: XI ROBOTIC RESECTION OF SIGMOID COLON, RIGID PROCTOSCOPY;  Surgeon: Michael Boston, MD;  Location: [facility];  Service: General;  Laterality: N/A;    Current Medications: Current Meds  Medication Sig   atorvastatin (LIPITOR) 10 MG tablet TAKE 1/2 TABLET (5 MG TOTAL) BY MOUTH EVERY DAY AT BEDTIME. (Patient taking differently: Take 5 mg by mouth every other day.)   b complex vitamins tablet Take 1 tablet by mouth once a week.   Coenzyme Q10 200 MG capsule Take 200 mg by mouth once a week.    famotidine (PEPCID) 20 MG tablet TAKE 1 TABLET BY MOUTH TWICE A DAY (Patient taking differently: Take 20 mg by mouth as needed  for heartburn or indigestion.)   metoprolol succinate (TOPROL-XL) 25 MG 24 hr tablet TAKE 1/2 TABLET BY MOUTH EVERY DAY   Multiple Vitamin (MULTIVITAMIN WITH MINERALS) TABS tablet Take 1 tablet by mouth every other day.     Allergies:   Amoxicillin, Proair hfa [albuterol], and Penicillins   Social History   Socioeconomic History   Marital status: Married    Spouse name: Not on file   Number of children: 2   Years of education: Not on file   Highest education level: Not on file  Occupational History   Occupation: retired    Comment: Science writer  Tobacco Use   Smoking status: Former    Packs/day: 0.50    Types: Cigarettes    Quit date: 09/07/2015    Years since quitting: 6.5   Smokeless tobacco: Never  Vaping Use   Vaping Use: Never used  Substance and Sexual Activity   Alcohol use: Yes    Comment: 2 glasses wine/day   Drug use: No   Sexual activity: Yes  Other Topics Concern   Not on file  Social History Narrative   Married, retired   2 children   Former smoker no drug use, does drink alcohol   Right handed   Social Determinants of Health   Financial Resource Strain: Low Risk  (01/19/2022)   Overall Financial Resource Strain (CARDIA)    Difficulty of Paying Living Expenses: Not hard at all  Food Insecurity: No Food Insecurity (01/19/2022)   Hunger Vital Sign    Worried About Running Out of Food in the Last Year: Never true    [place] in the Last Year: Never true  Transportation Needs: No Transportation Needs (01/19/2022)   PRAPARE - Hydrologist (Medical): No    Lack of Transportation (Non-Medical): No  Physical Activity: Sufficiently Active (01/19/2022)   Exercise Vital Sign    Days of Exercise per Week: 7 days    Minutes of Exercise per Session: 30 min  Stress: No Stress Concern Present (01/19/2022)   [place]    Feeling of Stress : Not at all  Social Connections:  [place] (01/19/2022)   Social Connection and Isolation Panel [NHANES]    Frequency of Communication with Friends and Family: More than three times a week    Frequency of Social Gatherings with Friends and Family: Once a week    Attends Religious Services: More than 4 times per year    Active Member of [employer] or Organizations: Yes    Attends Music therapist: More than 4 times per year    Marital Status: Married     Family History: The patient's family history includes Alcohol abuse in her mother; Heart attack (age of onset: 63) in her father; Heart disease in her paternal grandfather and paternal grandmother; Hypertension in her mother; Obesity in her daughter; Stroke in her mother; Tremor in her mother. There is no history of Colon cancer, Breast cancer, Prostate cancer, Diabetes, Liver cancer, Rectal cancer, or Stomach cancer. ROS:   Please see the history of present illness.    All other systems reviewed and are negative.  EKGs/Labs/Other Studies Reviewed:    The following studies were reviewed today:  EKG:  EKG ordered today and personally reviewed.  The ekg ordered today demonstrates sinus rhythm and is normal  Recent Labs: 01/14/2022: ALT 18; BUN 16; Creatinine, Ser 0.73; Hemoglobin 15.2; Platelets 256.0; Potassium 4.7; Sodium 137; TSH 1.40  Recent Lipid Panel    Component Value Date/Time   CHOL 236 (H) 01/14/2022 1018   TRIG 123.0 01/14/2022 1018   HDL 72.60 01/14/2022 1018   CHOLHDL 3 01/14/2022 1018   VLDL 24.6 01/14/2022 1018   LDLCALC 139 (H) 01/14/2022 1018   LDLCALC 139 (H) 04/24/2020 0912   LDLDIRECT 148.6 09/26/2008 0811    Physical Exam:    VS:  BP 132/80   Pulse 81   Ht '5\' 6"'$  (1.676 m)   Wt 220 lb (99.8 kg)   SpO2 94%   BMI 35.51 kg/m  Wt Readings from Last 3 Encounters:  03/31/22 220 lb (99.8 kg)  01/14/22 214 lb 12.8 oz (97.4 kg)  10/21/21 212 lb 9.6 oz (96.4 kg)     GEN: She has a tremor well nourished, well developed  in no acute distress HEENT: Normal NECK: No JVD; No carotid bruits LYMPHATICS: No lymphadenopathy CARDIAC: RRR, no murmurs, rubs, gallops RESPIRATORY:  Clear to auscultation without rales, wheezing or rhonchi  ABDOMEN: Soft, non-tender, non-distended MUSCULOSKELETAL:  No edema; No deformity  SKIN: Warm and dry NEUROLOGIC:  Alert and oriented x 3 PSYCHIATRIC:  Normal affect    Signed, Shirlee More, MD  03/31/2022 2:25 PM    [place]

## 2022-03-31 NOTE — Patient Instructions (Signed)
Medication Instructions:  Your physician has recommended you make the following change in your medication:   START: Pravastatin 20 mg daily STOP: Lipitor  *If you need a refill on your cardiac medications before your next appointment, please call your pharmacy*   Lab Work: None If you have labs (blood work) drawn today and your tests are completely normal, you will receive your results only by: [place] (if you have MyChart) OR A paper copy in the mail If you have any lab test that is abnormal or we need to change your treatment, we will call you to review the results.   Testing/Procedures: None   Follow-Up: At [facility], you and your health needs are our priority.  As part of our continuing mission to provide you with exceptional heart care, we have created designated Provider Care Teams.  These Care Teams include your primary Cardiologist (physician) and Advanced Practice Providers (APPs -  Physician Assistants and Nurse Practitioners) who all work together to provide you with the care you need, when you need it.  We recommend signing up for the patient portal called "MyChart".  Sign up information is provided on this After Visit Summary.  MyChart is used to connect with patients for Virtual Visits (Telemedicine).  Patients are able to view lab/test results, encounter notes, upcoming appointments, etc.  Non-urgent messages can be sent to your provider as well.   To learn more about what you can do with MyChart, go to [email or web address].    Your next appointment:   1 year(s)  The format for your next appointment:   In Person  Provider:   Dr. Jenne Campus, MD.   Other Instructions Take an extra metoprolol daily as needed  Important Information About Sugar

## 2022-04-13 DIAGNOSIS — H7201 Central perforation of tympanic membrane, right ear: Secondary | ICD-10-CM | POA: Diagnosis not present

## 2022-04-13 DIAGNOSIS — J301 Allergic rhinitis due to pollen: Secondary | ICD-10-CM | POA: Diagnosis not present

## 2022-04-13 DIAGNOSIS — H90A31 Mixed conductive and sensorineural hearing loss, unilateral, right ear with restricted hearing on the contralateral side: Secondary | ICD-10-CM | POA: Diagnosis not present

## 2022-07-07 DIAGNOSIS — X32XXXS Exposure to sunlight, sequela: Secondary | ICD-10-CM | POA: Diagnosis not present

## 2022-07-07 DIAGNOSIS — L309 Dermatitis, unspecified: Secondary | ICD-10-CM | POA: Diagnosis not present

## 2022-07-07 DIAGNOSIS — L218 Other seborrheic dermatitis: Secondary | ICD-10-CM | POA: Diagnosis not present

## 2022-07-07 DIAGNOSIS — L814 Other melanin hyperpigmentation: Secondary | ICD-10-CM | POA: Diagnosis not present

## 2022-07-07 DIAGNOSIS — L821 Other seborrheic keratosis: Secondary | ICD-10-CM | POA: Diagnosis not present

## 2022-07-07 DIAGNOSIS — Z129 Encounter for screening for malignant neoplasm, site unspecified: Secondary | ICD-10-CM | POA: Diagnosis not present

## 2022-07-19 NOTE — Assessment & Plan Note (Deleted)
Patient encouraged to maintain heart healthy diet, regular exercise, adequate sleep. Consider daily probiotics. Take medications as prescribed Labs ordered and reviewed  Colonoscopy 2019 repeat in 2024 MGM 2022 repeat this year.  Pap aged out Dexa 12/22 repeat in 2 years RSV (respiratory syncitial virus) vaccine at pharmacy, [place] booster at pharmacy High dose flu shot  Shingrix is the new shingles shot, 2 shots over 2-6 months, confirm coverage with insurance and document, then can return here for shots with nurse appt or at pharmacy

## 2022-07-19 NOTE — Assessment & Plan Note (Deleted)
Encourage heart healthy diet such as MIND or DASH diet, increase exercise, avoid trans fats, simple carbohydrates and processed foods, consider a krill or fish or flaxseed oil cap daily. Tolerating Pravastatin 

## 2022-07-19 NOTE — Assessment & Plan Note (Deleted)
hgba1c acceptable, minimize simple carbs. Increase exercise as tolerated.  

## 2022-07-19 NOTE — Assessment & Plan Note (Deleted)
Well controlled, no changes to meds. Encouraged heart healthy diet such as the DASH diet and exercise as tolerated.  °

## 2022-07-20 ENCOUNTER — Encounter: Payer: Medicare Other | Admitting: Family Medicine

## 2022-07-20 DIAGNOSIS — E782 Mixed hyperlipidemia: Secondary | ICD-10-CM

## 2022-07-20 DIAGNOSIS — Z0000 Encounter for general adult medical examination without abnormal findings: Secondary | ICD-10-CM

## 2022-07-20 DIAGNOSIS — I10 Essential (primary) hypertension: Secondary | ICD-10-CM

## 2022-07-20 DIAGNOSIS — R739 Hyperglycemia, unspecified: Secondary | ICD-10-CM

## 2022-07-20 NOTE — Progress Notes (Shared)
Subjective:   By signing my name below, I, Kellie Simmering, attest that this documentation has been prepared under the direction and in the presence of [name], MD., 07/20/2022.    Patient ID: Renee [name], female    DOB: 1946-03-30, 76 y.o.   MRN: [id number]  No chief complaint on file.  HPI Patient is in today for a comprehensive physical exam and follow up on chronic medical concerns. Patient denies CP/ palp/ SOB/ HA/ congestion/fevers/GI or GU c/o.\  Dental:  Diet/Exercise:    FHx:   Immunizations:   Past Medical History:  Diagnosis Date   Abdominal pain 07/05/2017   ABNORMAL INVOLUNTARY MOVEMENTS 01/01/2009   Qualifier: Diagnosis of  By: Linna Darner MD, William     Allergic rhinitis 09/19/2008   Qualifier: Diagnosis of  By: Wynona Luna    Allergy    Arthralgia of right wrist 05/06/2020   Atypical chest pain 09/19/2008   Qualifier: Diagnosis of  By: Wynona Luna    Cervical cancer screening 05/08/2013   Menarche 11, regular Menopause 61s G2P2 s/p 2 svd No abnormal paps or [employer] Fat[employer] cyst on labia removed.    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   Qualifier: Diagnosis of  By: Wynona Luna    Dermatitis 09/17/2018   Diverticulosis 05/06/2017   Essential hypertension 09/19/2008   Qualifier: Diagnosis of  By: Wynona Luna    GERD (gastroesophageal reflux disease)    H/O osteopenia 02/08/2016   H/O tobacco use, presenting hazards to health 09/19/2008   Qualifier: Diagnosis of  By: Wynona Luna Smoking roughly 5 cig daily  Last cigarette was in December of 2016   Head tremor 12/27/2016   Hearing loss in right ear 09/19/2008   Qualifier: Diagnosis of  By: Wynona Luna    Hx of adenomatous colonic polyps 01/27/2018   Hyperglycemia 10/07/2016   Hyperlipidemia, mixed 10/03/2008   Qualifier:  Diagnosis of  By: Wynona Luna    Lumbar radiculopathy 07/06/2021   Neuromuscular disorder ([place])    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 Fixed sigmoid colon prevents. Referred for surgery.   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 02/09/2017   Tinnitus 12/12/2017   Tremor, essential    Vertigo    Vertigo    Past Surgical History:  Procedure Laterality Date   INNER EAR SURGERY  2015   with tube   MOUTH SURGERY  01/22/11   lower denture secured by titanium bolts   rectal suction biospy     XI ROBOTIC ASSISTED LOWER ANTERIOR RESECTION N/A 02/09/2017   Procedure: XI ROBOTIC RESECTION OF SIGMOID COLON, RIGID PROCTOSCOPY;  Surgeon: Michael Boston, MD;  Location: [facility];  Service: General;  Laterality: N/A;   Family History  Problem Relation Age of Onset   Stroke Mother    Hypertension Mother    Alcohol abuse Mother    Tremor Mother    Heart attack Father 25       deceased   Heart disease Paternal Grandmother        AAA rupture   Heart disease Paternal Grandfather        MI   Obesity Daughter    Colon cancer Neg Hx    Breast cancer Neg Hx    Prostate cancer Neg Hx    Diabetes Neg Hx    Liver cancer Neg Hx    Rectal cancer Neg Hx    Stomach cancer Neg Hx    Social History   Socioeconomic History   Marital status: Married    Spouse name: Not on file   Number of children: 2   Years of education: Not on file   Highest education level: Not on  file  Occupational History   Occupation: retired    Comment: banking  Tobacco Use   Smoking status: Former    Packs/day: 0.50    Types: Cigarettes    Quit date: 09/07/2015    Years since quitting: 6.8   Smokeless tobacco: Never  Vaping Use   Vaping Use: Never used  Substance and Sexual Activity   Alcohol use: Yes    Comment: 2 glasses wine/day   Drug use: No   Sexual activity: Yes  Other Topics Concern   Not on file  Social History Narrative   Married, retired   2 children   Former smoker no drug use, does drink alcohol   Right handed   Social Determinants of Health   Financial Resource Strain: Low Risk  (01/19/2022)   Overall Financial Resource Strain (CARDIA)    Difficulty of Paying Living Expenses: Not hard at all  Food Insecurity: No Food Insecurity (01/19/2022)   Hunger Vital Sign    Worried About Running Out of Food in the Last Year: Never true    [place] in the Last Year: Never true  Transportation Needs: No Transportation Needs (01/19/2022)   PRAPARE - Hydrologist (Medical): No    Lack of Transportation (Non-Medical): No  Physical Activity: Sufficiently Active (01/19/2022)   Exercise Vital Sign    Days of Exercise per Week: 7 days    Minutes of Exercise per Session: 30 min  Stress: No Stress Concern Present (01/19/2022)   [place]    Feeling of Stress : Not at all  Social Connections: [place] (01/19/2022)   Social Connection and Isolation Panel [NHANES]    Frequency of Communication with Friends and Family: More than three times a week    Frequency of Social Gatherings with Friends and Family: Once a week    Attends Religious Services: More than 4 times per year    Active Member of [employer] or Organizations: Yes    Attends Music therapist: More than 4 times per year    Marital Status: Married  Human resources officer Violence: Not At Risk  (01/19/2022)   Humiliation, Afraid, Rape, and Kick questionnaire    Fear of Current or Ex-Partner: No    Emotionally Abused: No    Physically Abused: No    Sexually Abused: No   Outpatient Medications Prior to Visit  Medication Sig Dispense Refill   b complex vitamins tablet  Take 1 tablet by mouth once a week.     Coenzyme Q10 200 MG capsule Take 200 mg by mouth once a week.      famotidine (PEPCID) 20 MG tablet TAKE 1 TABLET BY MOUTH TWICE A DAY (Patient taking differently: Take 20 mg by mouth as needed for heartburn or indigestion.) 180 tablet 1   metoprolol succinate (TOPROL-XL) 25 MG 24 hr tablet Take 0.5 tablets (12.5 mg total) by mouth daily. Take an extra metoprolol daily as needed. 45 tablet 3   Multiple Vitamin (MULTIVITAMIN WITH MINERALS) TABS tablet Take 1 tablet by mouth every other day.     pravastatin (PRAVACHOL) 20 MG tablet Take 1 tablet (20 mg total) by mouth every evening. 90 tablet 3   Vitamin D, Ergocalciferol, (DRISDOL) 1.25 MG (50000 UNIT) CAPS capsule Take 1 capsule (50,000 Units total) by mouth every 7 (seven) days. (Patient not taking: Reported on 03/31/2022) 12 capsule 1   No facility-administered medications prior to visit.   Allergies  Allergen Reactions   Amoxicillin     REACTION: TROUBLE BREATHING Has patient had a PCN reaction causing immediate rash, facial/tongue/throat swelling, SOB or lightheadedness with hypotension:Yes Has patient had a PCN reaction causing severe rash involving mucus membranes or skin necrosis:No Has patient had a PCN reaction that required hospitalization:No Has patient had a PCN reaction occurring within the last 10 years:No If all of the above answers are "NO", then may proceed with Cephalosporin use.    Proair Hfa [Albuterol] Other (See Comments)    Hypersensitivity, anxious, disoriented   Penicillins Rash    Has patient had a PCN reaction causing immediate rash, facial/tongue/throat swelling, SOB or lightheadedness with  hypotension:unknown Has patient had a PCN reaction causing severe rash involving mucus membranes or skin necrosis:unknown Has patient had a PCN reaction that required hospitalization:no Has patient had a PCN reaction occurring within the last 10 years:no If all of the above answers are "NO", then may proceed with Cephalosporin use.    Review of Systems  Constitutional:  Negative for chills and fever.  HENT:  Negative for congestion.   Respiratory:  Negative for shortness of breath.   Cardiovascular:  Negative for chest pain and palpitations.  Gastrointestinal:  Negative for abdominal pain, blood in stool, constipation, diarrhea, nausea and vomiting.  Genitourinary:  Negative for dysuria, frequency, hematuria and urgency.  Skin:           Neurological:  Negative for headaches.      Objective:    Physical Exam Constitutional:      General: She is not in acute distress.    Appearance: Normal appearance. She is normal weight. She is not ill-appearing.  HENT:     Head: Normocephalic and atraumatic.     Right Ear: External ear normal.     Left Ear: External ear normal.     Nose: Nose normal.     Mouth/Throat:     Mouth: Mucous membranes are moist.     Pharynx: Oropharynx is clear.  Eyes:     General:        Right eye: No discharge.        Left eye: No discharge.     Extraocular Movements: Extraocular movements intact.     Pupils: Pupils are equal, round, and reactive to light.  Cardiovascular:     Rate and Rhythm: Normal rate and regular rhythm.     Pulses: Normal pulses.     Heart sounds: Normal heart sounds. No murmur heard.  No gallop.  Pulmonary:     Effort: Pulmonary effort is normal. No respiratory distress.     Breath sounds: Normal breath sounds. No wheezing or rales.  Abdominal:     General: Bowel sounds are normal.     Tenderness: There is no abdominal tenderness. There is no guarding.  Musculoskeletal:        General: Normal range of motion.     Right lower  leg: No edema.     Left lower leg: No edema.  Skin:    General: Skin is warm and dry.  Neurological:     Mental Status: She is alert and oriented to person, place, and time.  Psychiatric:        Mood and Affect: Mood normal.        Behavior: Behavior normal.        Judgment: Judgment normal.    There were no vitals taken for this visit. Wt Readings from Last 3 Encounters:  03/31/22 220 lb (99.8 kg)  01/14/22 214 lb 12.8 oz (97.4 kg)  10/21/21 212 lb 9.6 oz (96.4 kg)   Diabetic Foot Exam - Simple   No data filed    Lab Results  Component Value Date   WBC 6.5 01/14/2022   HGB 15.2 (H) 01/14/2022   HCT 45.8 01/14/2022   PLT 256.0 01/14/2022   GLUCOSE 107 (H) 01/14/2022   CHOL 236 (H) 01/14/2022   TRIG 123.0 01/14/2022   HDL 72.60 01/14/2022   LDLDIRECT 148.6 09/26/2008   LDLCALC 139 (H) 01/14/2022   ALT 18 01/14/2022   AST 16 01/14/2022   NA 137 01/14/2022   K 4.7 01/14/2022   CL 102 01/14/2022   CREATININE 0.73 01/14/2022   BUN 16 01/14/2022   CO2 27 01/14/2022   TSH 1.40 01/14/2022   HGBA1C 5.9 01/14/2022   Lab Results  Component Value Date   TSH 1.40 01/14/2022   Lab Results  Component Value Date   WBC 6.5 01/14/2022   HGB 15.2 (H) 01/14/2022   HCT 45.8 01/14/2022   MCV 100.3 (H) 01/14/2022   PLT 256.0 01/14/2022   Lab Results  Component Value Date   NA 137 01/14/2022   K 4.7 01/14/2022   CO2 27 01/14/2022   GLUCOSE 107 (H) 01/14/2022   BUN 16 01/14/2022   CREATININE 0.73 01/14/2022   BILITOT 0.9 01/14/2022   ALKPHOS 81 01/14/2022   AST 16 01/14/2022   ALT 18 01/14/2022   PROT 7.1 01/14/2022   ALBUMIN 4.4 01/14/2022   CALCIUM 9.4 01/14/2022   ANIONGAP 6 02/10/2017   GFR 80.14 01/14/2022   Lab Results  Component Value Date   CHOL 236 (H) 01/14/2022   Lab Results  Component Value Date   HDL 72.60 01/14/2022   Lab Results  Component Value Date   LDLCALC 139 (H) 01/14/2022   Lab Results  Component Value Date   TRIG 123.0 01/14/2022    Lab Results  Component Value Date   CHOLHDL 3 01/14/2022   Lab Results  Component Value Date   HGBA1C 5.9 01/14/2022      Assessment & Plan:   Colonoscopy: Last completed on 01/17/2018. - Four diminutive polyps in the sigmoid colon, in the descending colon, in the transverse colon and in the ascending colon, removed with a cold snare. Resected and retrieved. - One 1 to 2 mm polyp in the cecum, removed with a cold biopsy forceps. Resected and retrieved. - Patent end-to-end colo-colonic anastomosis, characterized by healthy appearing mucosa. -  The examination was otherwise normal on direct and retroflexion views. Repeat in 5 years.  DEXA: Last completed on 08/11/2021. The BMD measured at Forearm Radius 33% is 0.724 g/cm2 with a T-score of -1.7. This patient is considered osteopenic according to [place] [facility]) criteria. L-1 & 2 was excluded due to degenerative changes. Compared with the prior study on, 09/26/2018 the BMD of the total mean shows no statistically signficant change. The scan quality is good. Repeat in 5 years.   Mammogram: Last completed on 08/11/2021. No mammographic evidence of malignancy. Repeat in 1 year.   Pap Smear: Last completed on 05/08/2013. Normal results.   Problem List Items Addressed This Visit       Cardiovascular and Mediastinum   Essential hypertension - Primary     Other   Preventative health care   Hyperglycemia   Hyperlipidemia, mixed   No orders of the defined types were placed in this encounter.  I, Kellie Simmering, personally preformed the services described in this documentation.  All medical record entries made by the scribe were at my direction and in my presence.  I have reviewed the chart and discharge instructions (if applicable) and agree that the record reflects my personal performance and is accurate and complete. 07/20/2022  I,Mohammed Iqbal,acting as a scribe for Penni Homans, MD.,have documented all relevant  documentation on the behalf of Penni Homans, MD,as directed by  Penni Homans, MD while in the presence of Penni Homans, MD.  Kellie Simmering

## 2022-07-27 NOTE — Progress Notes (Signed)
Subjective:   By signing my name below, I, Renee [name], attest that this documentation has been prepared under the direction and in the presence of Renee Lukes, MD., 07/29/2022.   Patient ID: DEMAYA [name], female    DOB: 1946/05/30, 76 y.o.   MRN: [id number]  Chief Complaint  Patient presents with   Annual Exam    Annual Exam    HPI Patient is in today for a comprehensive physical exam and follow up on chronic medical concerns. Denies CP/ palpitations/ SOB/ HA/ congestion/ fevers/GI or GU c/o.  Patient has a rhythmic tremor and is frequently jittering during today's visit.  Blood Pressure Patient's blood pressure is slightly elevated today. She has not been taking her Metoprolol 25 mg consistently due to taking care of her husband with cancer.  BP Readings from Last 3 Encounters:  07/29/22 (!) 140/80  03/31/22 132/80  01/14/22 138/86   Pulse Readings from Last 3 Encounters:  07/29/22 89  03/31/22 81  01/14/22 74   Cholesterol Patient is currently taking Pravastatin 5 mg to manage her cholesterol. She was priorly taking Atorvastatin which caused muscle cramps. However, she is interested in taking Atorvastatin again.  Dermatology She is currently using Hydrocortisone 2.5% cream to manage a rash on both forearms. She is also using a Clobetasol ointment to manage blisters on her right foot. She states that the bilateral forearm rash does not itch, but the blisters on the right foot do.  Exercise She partakes in [place] 3 times weekly.  FHx No changes to the family history.  Supplements She is wondering whether or not she should resume taking vitamin D 50,000 IU. She takes vitam B supplements occasionally but is not currently taking any supplements consistently.  Social History She has stopped smoking cigarettes.  Weight Management Patient is attempting to lose weight. Body mass index is 35.51 kg/m. Wt Readings from Last 3 Encounters:  07/29/22 220 lb (99.8 kg)   03/31/22 220 lb (99.8 kg)  01/14/22 214 lb 12.8 oz (97.4 kg)   Past Medical History:  Diagnosis Date   Abdominal pain 07/05/2017   ABNORMAL INVOLUNTARY MOVEMENTS 01/01/2009   Qualifier: Diagnosis of  By: Linna Darner MD, William     Allergic rhinitis 09/19/2008   Qualifier: Diagnosis of  By: Wynona Luna    Allergy    Arthralgia of right wrist 05/06/2020   Atypical chest pain 09/19/2008   Qualifier: Diagnosis of  By: Wynona Luna    Cervical cancer screening 05/08/2013   Menarche 11, regular Menopause 77s G2P2 s/p 2 svd No abnormal paps or MGM Fatty cyst on labia removed.    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   Qualifier: Diagnosis of  By: Wynona Luna    Dermatitis 09/17/2018   Diverticulosis 05/06/2017   Essential hypertension 09/19/2008   Qualifier: Diagnosis of  By: Wynona Luna    GERD (gastroesophageal reflux disease)    H/O osteopenia 02/08/2016   H/O tobacco use, presenting hazards to health 09/19/2008   Qualifier: Diagnosis of  By: Wynona Luna Smoking roughly 5 cig daily  Last cigarette was in December of 2016   Head tremor 12/27/2016   Hearing loss in right ear 09/19/2008   Qualifier: Diagnosis of  By: Wynona Luna  Hx of adenomatous colonic polyps 01/27/2018   Hyperglycemia 10/07/2016   Hyperlipidemia, mixed 10/03/2008   Qualifier: Diagnosis of  By: Wynona Luna    Lumbar radiculopathy 07/06/2021   Neuromuscular disorder ([place])    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 Fixed sigmoid colon prevents. Referred for surgery.   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 02/09/2017   Tinnitus 12/12/2017   Tremor, essential    Vertigo    Vertigo    Past Surgical History:  Procedure Laterality Date   INNER EAR SURGERY  2015   with tube   MOUTH SURGERY  01/22/11   lower denture secured by titanium bolts   rectal suction biospy     XI ROBOTIC ASSISTED LOWER ANTERIOR RESECTION N/A 02/09/2017   Procedure: XI ROBOTIC RESECTION OF SIGMOID COLON, RIGID PROCTOSCOPY;  Surgeon: Michael Boston, MD;  Location: [facility];  Service: General;  Laterality: N/A;   Family History  Problem Relation Age of Onset   Stroke Mother    Hypertension Mother    Alcohol abuse Mother    Tremor Mother    Heart attack Father 51       deceased   Heart disease Paternal Grandmother        AAA rupture   Heart disease Paternal Grandfather        MI   Obesity Daughter    Colon cancer Neg Hx    Breast cancer Neg Hx    Prostate cancer Neg Hx    Diabetes Neg Hx    Liver cancer Neg Hx    Rectal cancer Neg Hx    Stomach cancer Neg Hx    Social History   Socioeconomic History   Marital status: Married    Spouse name: Not on file   Number of children: 2   Years of education: Not on file   Highest education level: Not on file  Occupational History   Occupation: retired    Comment: banking  Tobacco Use   Smoking status: Former    Packs/day: 0.50    Types: Cigarettes    Quit date: 09/07/2015    Years since quitting: 6.9   Smokeless tobacco: Never  Vaping Use   Vaping Use: Never used  Substance and Sexual Activity   Alcohol use: Yes    Comment: 2 glasses wine/day   Drug use: No   Sexual activity: Yes  Other Topics Concern   Not on file  Social History Narrative    Married, retired   2 children   Former smoker no drug use, does drink alcohol   Right handed   Social Determinants of Health   Financial Resource Strain: Low Risk  (01/19/2022)   Overall Financial Resource Strain (CARDIA)    Difficulty of Paying Living Expenses: Not hard at all  Food Insecurity: No Food Insecurity (01/19/2022)   Hunger Vital Sign    Worried About Running Out of Food in the  Last Year: Never true    [place] in the Last Year: Never true  Transportation Needs: No Transportation Needs (01/19/2022)   PRAPARE - Hydrologist (Medical): No    Lack of Transportation (Non-Medical): No  Physical Activity: Sufficiently Active (01/19/2022)   Exercise Vital Sign    Days of Exercise per Week: 7 days    Minutes of Exercise per Session: 30 min  Stress: No Stress Concern Present (01/19/2022)   [place]    Feeling of Stress : Not at all  Social Connections: [place] (01/19/2022)   Social Connection and Isolation Panel [NHANES]    Frequency of Communication with Friends and Family: More than three times a week    Frequency of Social Gatherings with Friends and Family: Once a week    Attends Religious Services: More than 4 times per year    Active Member of [employer] or Organizations: Yes    Attends Music therapist: More than 4 times per year    Marital Status: Married  Human resources officer Violence: Not At Risk (01/19/2022)   Humiliation, Afraid, Rape, and Kick questionnaire    Fear of Current or Ex-Partner: No    Emotionally Abused: No    Physically Abused: No    Sexually Abused: No   Outpatient Medications Prior to Visit  Medication Sig Dispense Refill   b complex vitamins tablet Take 1 tablet by mouth once a week.     Coenzyme Q10 200 MG capsule Take 200 mg by mouth once a week.      famotidine (PEPCID) 20 MG tablet TAKE 1 TABLET BY MOUTH TWICE A DAY (Patient  taking differently: Take 20 mg by mouth as needed for heartburn or indigestion.) 180 tablet 1   metoprolol succinate (TOPROL-XL) 25 MG 24 hr tablet Take 0.5 tablets (12.5 mg total) by mouth daily. Take an extra metoprolol daily as needed. 45 tablet 3   Multiple Vitamin (MULTIVITAMIN WITH MINERALS) TABS tablet Take 1 tablet by mouth every other day.     pravastatin (PRAVACHOL) 20 MG tablet Take 1 tablet (20 mg total) by mouth every evening. 90 tablet 3   Vitamin D, Ergocalciferol, (DRISDOL) 1.25 MG (50000 UNIT) CAPS capsule Take 1 capsule (50,000 Units total) by mouth every 7 (seven) days. 12 capsule 1   No facility-administered medications prior to visit.   Allergies  Allergen Reactions   Amoxicillin     REACTION: TROUBLE BREATHING Has patient had a PCN reaction causing immediate rash, facial/tongue/throat swelling, SOB or lightheadedness with hypotension:Yes Has patient had a PCN reaction causing severe rash involving mucus membranes or skin necrosis:No Has patient had a PCN reaction that required hospitalization:No Has patient had a PCN reaction occurring within the last 10 years:No If all of the above answers are "NO", then may proceed with Cephalosporin use.    Proair Hfa [Albuterol] Other (See Comments)    Hypersensitivity, anxious, disoriented   Penicillins Rash    Has patient had a PCN reaction causing immediate rash, facial/tongue/throat swelling, SOB or lightheadedness with hypotension:unknown Has patient had a PCN reaction causing severe rash involving mucus membranes or skin necrosis:unknown Has patient had a PCN reaction that required hospitalization:no Has patient had a PCN reaction occurring within the last 10 years:no If all of the above answers are "NO", then may proceed with Cephalosporin use.    Review of Systems  Constitutional:  Negative for chills and fever.  HENT:  Negative for congestion.   Respiratory:  Negative for shortness of breath.   Cardiovascular:   Negative for chest pain and palpitations.  Gastrointestinal:  Negative for abdominal pain, blood in stool, constipation, diarrhea, nausea and vomiting.  Genitourinary:  Negative for dysuria, frequency, hematuria and urgency.  Skin:  Positive for itching (right foot) and rash (bilateral forearms).          Neurological:  Negative for headaches.      Objective:    Physical Exam Constitutional:      General: She is not in acute distress.    Appearance: Normal appearance. She is obese. She is not ill-appearing.  HENT:     Head: Normocephalic and atraumatic.     Right Ear: Tympanic membrane, ear canal and external ear normal.     Left Ear: Tympanic membrane, ear canal and external ear normal.     Nose: Nose normal.     Mouth/Throat:     Mouth: Mucous membranes are moist.     Pharynx: Oropharynx is clear.  Eyes:     General:        Right eye: No discharge.        Left eye: No discharge.     Extraocular Movements: Extraocular movements intact.     Right eye: No nystagmus.     Left eye: No nystagmus.     Pupils: Pupils are equal, round, and reactive to light.  Neck:     Vascular: No carotid bruit.  Cardiovascular:     Rate and Rhythm: Normal rate and regular rhythm.     Pulses: Normal pulses.     Heart sounds: Normal heart sounds. No murmur heard.    No gallop.  Pulmonary:     Effort: Pulmonary effort is normal. No respiratory distress.     Breath sounds: Normal breath sounds. No wheezing or rales.  Abdominal:     General: Bowel sounds are normal.     Palpations: Abdomen is soft.     Tenderness: There is no abdominal tenderness. There is no guarding.  Musculoskeletal:        General: Normal range of motion.     Cervical back: Normal range of motion.     Right lower leg: No edema.     Left lower leg: No edema.     Comments: Muscle strength 5/5 on upper and lower extremities.   Feet:     Right foot:     Skin integrity: Blister, erythema and dry skin present.     Toenail  Condition: Right toenails are abnormally thick and long. Fungal disease present.    Comments: Small papular blisters on the right foot. Lymphadenopathy:     Cervical: No cervical adenopathy.  Skin:    General: Skin is warm and dry.     Comments: Bilateral forearm rash, mildly erythematic. Small papular blisters on the right foot.  Neurological:     Mental Status: She is alert and oriented to person, place, and time.     Sensory: Sensation is intact.     Motor: Motor function is intact.     Coordination: Coordination is intact.     Deep Tendon Reflexes:     Reflex Scores:      Patellar reflexes are 2+ on the right side and 2+ on the left side. Psychiatric:        Mood and Affect: Mood normal.        Behavior: Behavior normal.  Judgment: Judgment normal.    BP (!) 140/80 (BP Location: Left Arm, Patient Position: Sitting, Cuff Size: Normal)   Pulse 89   Temp 98 F (36.7 C) (Oral)   Resp 16   Ht _0  (1.676 m)   Wt 220 lb (99.8 kg)   SpO2 95%   BMI 35.51 kg/m  Wt Readings from Last 3 Encounters:  07/29/22 220 lb (99.8 kg)  03/31/22 220 lb (99.8 kg)  01/14/22 214 lb 12.8 oz (97.4 kg)   Diabetic Foot Exam - Simple   No data filed    Lab Results  Component Value Date   WBC 7.7 07/29/2022   HGB 15.5 (H) 07/29/2022   HCT 45.6 07/29/2022   PLT 275.0 07/29/2022   GLUCOSE 100 (H) 07/29/2022   CHOL 252 (H) 07/29/2022   TRIG 157.0 (H) 07/29/2022   HDL 73.30 07/29/2022   LDLDIRECT 148.6 09/26/2008   LDLCALC 147 (H) 07/29/2022   ALT 19 07/29/2022   AST 18 07/29/2022   NA 137 07/29/2022   K 4.4 07/29/2022   CL 102 07/29/2022   CREATININE 0.78 07/29/2022   BUN 14 07/29/2022   CO2 26 07/29/2022   TSH 1.63 07/29/2022   HGBA1C 6.0 07/29/2022   Lab Results  Component Value Date   TSH 1.63 07/29/2022   Lab Results  Component Value Date   WBC 7.7 07/29/2022   HGB 15.5 (H) 07/29/2022   HCT 45.6 07/29/2022   MCV 102.2 (H) 07/29/2022   PLT 275.0 07/29/2022    Lab Results  Component Value Date   NA 137 07/29/2022   K 4.4 07/29/2022   CO2 26 07/29/2022   GLUCOSE 100 (H) 07/29/2022   BUN 14 07/29/2022   CREATININE 0.78 07/29/2022   BILITOT 0.7 07/29/2022   ALKPHOS 86 07/29/2022   AST 18 07/29/2022   ALT 19 07/29/2022   PROT 7.1 07/29/2022   ALBUMIN 4.3 07/29/2022   CALCIUM 9.3 07/29/2022   ANIONGAP 6 02/10/2017   GFR 73.74 07/29/2022   Lab Results  Component Value Date   CHOL 252 (H) 07/29/2022   Lab Results  Component Value Date   HDL 73.30 07/29/2022   Lab Results  Component Value Date   LDLCALC 147 (H) 07/29/2022   Lab Results  Component Value Date   TRIG 157.0 (H) 07/29/2022   Lab Results  Component Value Date   CHOLHDL 3 07/29/2022   Lab Results  Component Value Date   HGBA1C 6.0 07/29/2022      Assessment & Plan:   Colonoscopy: Last completed on 01/17/2018.  -Four diminutive polyps in the sigmoid colon, in the descending colon, in the transverse colon and in the ascending colon, removed with a cold snare. Resected and retrieved. -One 1 to 2 mm polyp in the cecum, removed with a cold biopsy forceps. Resected and retrieved. -Patent end-to-end colo-colonic anastomosis, characterized by healthy appearing mucosa. -The examination was otherwise normal on direct and retroflexion views.  Repeat in 5 years.  DEXA: Last completed on 08/11/2021. The BMD measured at Forearm Radius 33% is 0.724 g/cm2 with a T-score of -1.7. This patient is considered OSTEOPENIC according to [facility]) criteria. L-1 & 2 was excluded due to degenerative changes. Compared with the prior study on, 09/26/2018 the BMD of the total mean shows no statistically signficant change. The scan quality is good. Repeat in 5 years.   Mammogram: Last completed on 08/11/2021. No mammographic evidence of malignancy.  Repeat in 1 year.  Pap Smear:  Last completed on 09/19/2008. Normal results.  Diet Patient has been advised to eat  health, hydrate, and meet a minimum of 4000 daily steps. She has also been advised to incorporate more calcium in her diet.  Immunizations Patient has been informed about receiving COVID-19, Influenza, RSV, and Shingles immunizations. Immunization History  Administered Date(s) Administered   PFIZER(Purple Top)SARS-COV-2 Vaccination 10/01/2019, 10/24/2019, 06/26/2020   Pneumococcal Conjugate-13 07/06/2016   Pneumococcal Polysaccharide-23 04/24/2020   Labs Routine lab work today will also check vitamin D.  Supplements Patient has been informed about taking calcium, flaxseed oil, krill, and multivitamins.  Problem List Items Addressed This Visit     Essential hypertension - Primary    Well controlled, no changes to meds. Encouraged heart healthy diet such as the DASH diet and exercise as tolerated.        Relevant Orders   CBC with Differential/Platelet (Completed)   Comprehensive metabolic panel (Completed)   TSH (Completed)   Preventative health care    Patient encouraged to maintain heart healthy diet, regular exercise, adequate sleep. Consider daily probiotics. Take medications as prescribed Labs ordered and reviewed  Colonoscopy 2019 repeat in 2024 MGM 2022 repeat this year.  Pap aged out Dexa 12/22 repeat in 2 years RSV (respiratory syncitial virus) vaccine at pharmacy, [place] booster at pharmacy High dose flu shot  Shingrix is the new shingles shot, 2 shots over 2-6 months, confirm coverage with insurance and document, then can return here for shots with nurse appt or at pharmacy        GERD (gastroesophageal reflux disease)    Avoid offending foods, start probiotics. Do not eat large meals in late evening and consider raising head of bed.        Hyperglycemia    hgba1c acceptable, minimize simple carbs. Increase exercise as tolerated.       Relevant Orders   HgB A1c (Completed)   Hyperlipidemia, mixed    Encourage heart healthy diet such as MIND or DASH  diet, increase exercise, avoid trans fats, simple carbohydrates and processed foods, consider a krill or fish or flaxseed oil cap daily.        Relevant Orders   Lipid panel (Completed)   Vitamin D deficiency    Supplement and monitor       Relevant Orders   VITAMIN D 25 Hydroxy (Vit-D Deficiency, Fractures) (Completed)   No orders of the defined types were placed in this encounter.  I, Penni Homans, MD, personally preformed the services described in this documentation.  All medical record entries made by the scribe were at my direction and in my presence.  I have reviewed the chart and discharge instructions (if applicable) and agree that the record reflects my personal performance and is accurate and complete. 07/29/2022  I,Mohammed Iqbal,acting as a scribe for Penni Homans, MD.,have documented all relevant documentation on the behalf of Penni Homans, MD,as directed by  Penni Homans, MD while in the presence of Penni Homans, MD.  Penni Homans, MD

## 2022-07-28 NOTE — Assessment & Plan Note (Signed)
Patient encouraged to maintain heart healthy diet, regular exercise, adequate sleep. Consider daily probiotics. Take medications as prescribed Labs ordered and reviewed  Colonoscopy 2019 repeat in 2024 MGM 2022 repeat this year.  Pap aged out Dexa 12/22 repeat in 2 years RSV (respiratory syncitial virus) vaccine at pharmacy, [place] booster at pharmacy High dose flu shot  Shingrix is the new shingles shot, 2 shots over 2-6 months, confirm coverage with insurance and document, then can return here for shots with nurse appt or at pharmacy

## 2022-07-28 NOTE — Assessment & Plan Note (Signed)
Encourage heart healthy diet such as MIND or DASH diet, increase exercise, avoid trans fats, simple carbohydrates and processed foods, consider a krill or fish or flaxseed oil cap daily.  °

## 2022-07-28 NOTE — Assessment & Plan Note (Signed)
hgba1c acceptable, minimize simple carbs. Increase exercise as tolerated.  

## 2022-07-28 NOTE — Assessment & Plan Note (Signed)
Avoid offending foods, start probiotics. Do not eat large meals in late evening and consider raising head of bed.  

## 2022-07-28 NOTE — Assessment & Plan Note (Signed)
Well controlled, no changes to meds. Encouraged heart healthy diet such as the DASH diet and exercise as tolerated.  °

## 2022-07-29 ENCOUNTER — Ambulatory Visit (INDEPENDENT_AMBULATORY_CARE_PROVIDER_SITE_OTHER): Payer: Medicare Other | Admitting: Family Medicine

## 2022-07-29 VITALS — BP 140/80 | HR 89 | Temp 98.0°F | Resp 16 | Ht 66.0 in | Wt 220.0 lb

## 2022-07-29 DIAGNOSIS — R739 Hyperglycemia, unspecified: Secondary | ICD-10-CM

## 2022-07-29 DIAGNOSIS — Z0000 Encounter for general adult medical examination without abnormal findings: Secondary | ICD-10-CM

## 2022-07-29 DIAGNOSIS — E782 Mixed hyperlipidemia: Secondary | ICD-10-CM | POA: Diagnosis not present

## 2022-07-29 DIAGNOSIS — E559 Vitamin D deficiency, unspecified: Secondary | ICD-10-CM

## 2022-07-29 DIAGNOSIS — K219 Gastro-esophageal reflux disease without esophagitis: Secondary | ICD-10-CM

## 2022-07-29 DIAGNOSIS — I10 Essential (primary) hypertension: Secondary | ICD-10-CM | POA: Diagnosis not present

## 2022-07-29 HISTORY — DX: Vitamin D deficiency, unspecified: E55.9

## 2022-07-29 NOTE — Patient Instructions (Addendum)
Recommend calcium intake of 1200 to 1500 mg daily, divided into roughly 3 doses. Best source is the diet and a single dairy serving is about 500 mg, a supplement of calcium citrate once or twice daily to balance diet is fine if not getting enough in diet. Also need Vitamin D 2000 IU caps, 1 cap daily if not already taking vitamin D. Also recommend weight baring exercise on hips and upper body to keep bones strong    Ideally a women's multivitamin with minerals and a vitamin D3 2000 IU daily Citracal 1 cap daily in evening Take a fish or krill or flaxseed oil capsules 1 daily  RSV (respiratory syncitial virus) vaccine at pharmacy, Arexvy Covid booster at pharmacy High dose flu shot  Shingrix is the new shingles shot, 2 shots over 2-6 months, confirm coverage with insurance and document, then can return here for shots with nurse appt or at [facility] 65 Years and Older, Female Preventive care refers to lifestyle choices and visits with your health care provider that can promote health and wellness. Preventive care visits are also called wellness exams. What can I expect for my preventive care visit? Counseling Your health care provider may ask you questions about your: Medical history, including: Past medical problems. Family medical history. Pregnancy and menstrual history. History of falls. Current health, including: Memory and ability to understand (cognition). Emotional well-being. Home life and relationship well-being. Sexual activity and sexual health. Lifestyle, including: Alcohol, nicotine or tobacco, and drug use. Access to firearms. Diet, exercise, and sleep habits. Work and work Statistician. Sunscreen use. Safety issues such as seatbelt and bike helmet use. Physical exam Your health care provider will check your: Height and weight. These may be used to calculate your BMI (body mass index). BMI is a measurement that tells if you are at a healthy  weight. Waist circumference. This measures the distance around your waistline. This measurement also tells if you are at a healthy weight and may help predict your risk of certain diseases, such as type 2 diabetes and high blood pressure. Heart rate and blood pressure. Body temperature. Skin for abnormal spots. What immunizations do I need?  Vaccines are usually given at various ages, according to a schedule. Your health care provider will recommend vaccines for you based on your age, medical history, and lifestyle or other factors, such as travel or where you work. What tests do I need? Screening Your health care provider may recommend screening tests for certain conditions. This may include: Lipid and cholesterol levels. Hepatitis C test. Hepatitis B test. HIV (human immunodeficiency virus) test. STI (sexually transmitted infection) testing, if you are at risk. Lung cancer screening. Colorectal cancer screening. Diabetes screening. This is done by checking your blood sugar (glucose) after you have not eaten for a while (fasting). Mammogram. Talk with your health care provider about how often you should have regular mammograms. BRCA-related cancer screening. This may be done if you have a family history of breast, ovarian, tubal, or peritoneal cancers. Bone density scan. This is done to screen for osteoporosis. Talk with your health care provider about your test results, treatment options, and if necessary, the need for more tests. Follow these instructions at home: Eating and drinking  Eat a diet that includes fresh fruits and vegetables, whole grains, lean protein, and low-fat dairy products. Limit your intake of foods with high amounts of sugar, saturated fats, and salt. Take vitamin and mineral supplements as recommended by your health care  provider. Do not drink alcohol if your health care provider tells you not to drink. If you drink alcohol: Limit how much you have to 0-1 drink  a day. Know how much alcohol is in your drink. In the [place] one drink equals one 12 oz bottle of beer (355 mL), one 5 oz glass of wine (148 mL), or one 1 oz glass of hard liquor (44 mL). Lifestyle Brush your teeth every morning and night with fluoride toothpaste. Floss one time each day. Exercise for at least 30 minutes 5 or more days each week. Do not use any products that contain nicotine or tobacco. These products include cigarettes, chewing tobacco, and vaping devices, such as e-cigarettes. If you need help quitting, ask your health care provider. Do not use drugs. If you are sexually active, practice safe sex. Use a condom or other form of protection in order to prevent STIs. Take aspirin only as told by your health care provider. Make sure that you understand how much to take and what form to take. Work with your health care provider to find out whether it is safe and beneficial for you to take aspirin daily. Ask your health care provider if you need to take a cholesterol-lowering medicine (statin). Find healthy ways to manage stress, such as: Meditation, yoga, or listening to music. Journaling. Talking to a trusted person. Spending time with friends and family. Minimize exposure to UV radiation to reduce your risk of skin cancer. Safety Always wear your seat belt while driving or riding in a vehicle. Do not drive: If you have been drinking alcohol. Do not ride with someone who has been drinking. When you are tired or distracted. While texting. If you have been using any mind-altering substances or drugs. Wear a helmet and other protective equipment during sports activities. If you have firearms in your house, make sure you follow all gun safety procedures. What's next? Visit your health care provider once a year for an annual wellness visit. Ask your health care provider how often you should have your eyes and teeth checked. Stay up to date on all vaccines. This information is  not intended to replace advice given to you by your health care provider. Make sure you discuss any questions you have with your health care provider. Document Revised: 02/04/2021 Document Reviewed: 02/04/2021 Elsevier Patient Education  [place].

## 2022-07-29 NOTE — Assessment & Plan Note (Signed)
Supplement and monitor 

## 2022-07-30 LAB — COMPREHENSIVE METABOLIC PANEL
ALT: 19 U/L (ref 0–35)
AST: 18 U/L (ref 0–37)
Albumin: 4.3 g/dL (ref 3.5–5.2)
Alkaline Phosphatase: 86 U/L (ref 39–117)
BUN: 14 mg/dL (ref 6–23)
CO2: 26 mEq/L (ref 19–32)
Calcium: 9.3 mg/dL (ref 8.4–10.5)
Chloride: 102 mEq/L (ref 96–112)
Creatinine, Ser: 0.78 mg/dL (ref 0.40–1.20)
GFR: 73.74 mL/min (ref >60.00)
Glucose, Bld: 100 mg/dL — ABNORMAL HIGH (ref 70–99)
Potassium: 4.4 mEq/L (ref 3.5–5.1)
Sodium: 137 mEq/L (ref 135–145)
Total Bilirubin: 0.7 mg/dL (ref 0.2–1.2)
Total Protein: 7.1 g/dL (ref 6.0–8.3)

## 2022-07-30 LAB — CBC WITH DIFFERENTIAL/PLATELET
Basophils Absolute: 0.1 10*3/uL (ref 0.0–0.1)
Basophils Relative: 0.9 % (ref 0.0–3.0)
Eosinophils Absolute: 0.1 10*3/uL (ref 0.0–0.7)
Eosinophils Relative: 1.6 % (ref 0.0–5.0)
HCT: 45.6 % (ref 36.0–46.0)
Hemoglobin: 15.5 g/dL — ABNORMAL HIGH (ref 12.0–15.0)
Lymphocytes Relative: 22.5 % (ref 12.0–46.0)
Lymphs Abs: 1.7 10*3/uL (ref 0.7–4.0)
MCHC: 34 g/dL (ref 30.0–36.0)
MCV: 102.2 fl — ABNORMAL HIGH (ref 78.0–100.0)
Monocytes Absolute: 0.7 10*3/uL (ref 0.1–1.0)
Monocytes Relative: 8.9 % (ref 3.0–12.0)
Neutro Abs: 5.1 10*3/uL (ref 1.4–7.7)
Neutrophils Relative %: 66.1 % (ref 43.0–77.0)
Platelets: 275 10*3/uL (ref 150.0–400.0)
RBC: 4.46 Mil/uL (ref 3.87–5.11)
RDW: 15.1 % (ref 11.5–15.5)
WBC: 7.7 10*3/uL (ref 4.0–10.5)

## 2022-07-30 LAB — LIPID PANEL
Cholesterol: 252 mg/dL — ABNORMAL HIGH (ref 0–200)
HDL: 73.3 mg/dL (ref >39.00)
LDL Cholesterol: 147 mg/dL — ABNORMAL HIGH (ref 0–99)
NonHDL: 178.52
Total CHOL/HDL Ratio: 3
Triglycerides: 157 mg/dL — ABNORMAL HIGH (ref 0.0–149.0)
VLDL: 31.4 mg/dL (ref 0.0–40.0)

## 2022-07-30 LAB — VITAMIN D 25 HYDROXY (VIT D DEFICIENCY, FRACTURES): VITD: 27.33 ng/mL — ABNORMAL LOW (ref 30.00–100.00)

## 2022-07-30 LAB — TSH: TSH: 1.63 u[IU]/mL (ref 0.35–5.50)

## 2022-07-30 LAB — HEMOGLOBIN A1C: Hgb A1c MFr Bld: 6 % (ref 4.6–6.5)

## 2022-09-06 ENCOUNTER — Telehealth: Payer: Medicare Other | Admitting: Physician Assistant

## 2022-09-06 DIAGNOSIS — J019 Acute sinusitis, unspecified: Secondary | ICD-10-CM

## 2022-09-06 DIAGNOSIS — B9689 Other specified bacterial agents as the cause of diseases classified elsewhere: Secondary | ICD-10-CM

## 2022-09-06 MED ORDER — DOXYCYCLINE HYCLATE 100 MG PO TABS: 100.0000 mg | ORAL_TABLET | Freq: Two times a day (BID) | ORAL | 0 refills | Status: DC

## 2022-09-06 MED ORDER — FLUTICASONE PROPIONATE 50 MCG/ACT NA SUSP: 2.0000 | Freq: Every day | NASAL | 0 refills | Status: DC

## 2022-09-06 NOTE — Progress Notes (Signed)
Virtual Visit Consent   Renee [name], you are scheduled for a virtual visit with a [facility] provider today. Just as with appointments in the office, your consent must be obtained to participate. Your consent will be active for this visit and any virtual visit you may have with one of our providers in the next 365 days. If you have a MyChart account, a copy of this consent can be sent to you electronically.  As this is a virtual visit, video technology does not allow for your provider to perform a traditional examination. This may limit your provider's ability to fully assess your condition. If your provider identifies any concerns that need to be evaluated in person or the need to arrange testing (such as labs, EKG, etc.), we will make arrangements to do so. Although advances in technology are sophisticated, we cannot ensure that it will always work on either your end or our end. If the connection with a video visit is poor, the visit may have to be switched to a telephone visit. With either a video or telephone visit, we are not always able to ensure that we have a secure connection.  By engaging in this virtual visit, you consent to the provision of healthcare and authorize for your insurance to be billed (if applicable) for the services provided during this visit. Depending on your insurance coverage, you may receive a charge related to this service.  I need to obtain your verbal consent now. Are you willing to proceed with your visit today? Renee [name] has provided verbal consent on 09/06/2022 for a virtual visit (video or telephone). Renee [name], [place]  Date: 09/06/2022 2:36 PM  Virtual Visit via Video Note   I, Renee [name], connected with  TATTIANA FAKHOURI  ([id number], Jan 09, 1946) on 09/06/22 at  2:30 PM EST by a video-enabled telemedicine application and verified that I am speaking with the correct person using two identifiers.  Location: Patient: Virtual Visit Location  Patient: Home Provider: Virtual Visit Location Provider: Home Office   I discussed the limitations of evaluation and management by telemedicine and the availability of in person appointments. The patient expressed understanding and agreed to proceed.    History of Present Illness: Renee [name] is a 77 y.o. who identifies as a female who was assigned female at birth, and is being seen today for a few weeks of URI symptoms. Notes symptoms starting with pressure in her ears and sinuses with rhinorrhea and ST. Notes some coughing. Rhinorrhea and ST resolved but sinus pressure and nasal congestion have progressed with sinus pain. Denies chest pain or SOB. Has been taking Dayquil OTC.  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   Dermatitis 09/17/2018   Presence of tympanostomy tube in tympanic membrane 09/17/2018   Shoulder pain 06/29/2018   Hx of adenomatous colonic polyps 01/27/2018   Tinnitus 12/12/2017   Rib pain on left side 11/17/2017   Abdominal pain 07/05/2017   PVC's (premature  ventricular contractions) 05/10/2017   Diverticulosis 05/06/2017   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 02/09/2017   Head tremor 12/27/2016   Polyp of sigmoid colon 12/08/2016   Hyperglycemia 10/07/2016   Obesity 07/06/2016   GERD (gastroesophageal reflux disease) 07/06/2016   H/O osteopenia 02/08/2016   Cervical pain (neck) 10/07/2015   Preventative health care 05/12/2013   Cervical cancer screening 05/08/2013   ABNORMAL INVOLUNTARY  MOVEMENTS 01/01/2009   Hyperlipidemia, mixed 10/03/2008   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   Essential hypertension 09/19/2008   Allergic rhinitis 09/19/2008   Atypical chest pain 09/19/2008   Hearing loss in right ear 09/19/2008   CHEST PAIN 09/19/2008   H/O tobacco use, presenting hazards to health 09/19/2008    Allergies:  Allergies  Allergen Reactions   Amoxicillin     REACTION: TROUBLE BREATHING Has patient had a PCN reaction causing immediate rash, facial/tongue/throat swelling, SOB or lightheadedness with hypotension:Yes Has patient had a PCN reaction causing severe rash involving mucus membranes or skin necrosis:No Has patient had a PCN reaction that required hospitalization:No Has patient had a PCN reaction occurring within the last 10 years:No If all of the above answers are "NO", then may proceed with Cephalosporin use.    Proair Hfa [Albuterol] Other (See Comments)    Hypersensitivity, anxious, disoriented   Penicillins Rash    Has patient had a PCN reaction causing immediate rash, facial/tongue/throat swelling, SOB or lightheadedness with hypotension:unknown Has patient had a PCN reaction causing severe rash involving mucus membranes or skin necrosis:unknown Has patient had a PCN reaction that required hospitalization:no Has patient had a PCN reaction occurring within the last 10 years:no If all of the above answers are "NO", then may proceed with Cephalosporin use.    Medications:  Current Outpatient Medications:    doxycycline (VIBRA-TABS) 100 MG tablet, Take 1 tablet (100 mg total) by mouth 2 (two) times daily., Disp: 20 tablet, Rfl: 0   fluticasone (FLONASE) 50 MCG/ACT nasal spray, Place 2 sprays into both nostrils daily., Disp: 16 g, Rfl: 0   b complex vitamins tablet, Take 1 tablet by mouth once a week., Disp: , Rfl:    Coenzyme Q10 200 MG capsule, Take 200 mg by mouth once a week. , Disp: , Rfl:    famotidine (PEPCID) 20 MG tablet, TAKE 1 TABLET BY MOUTH  TWICE A DAY (Patient taking differently: Take 20 mg by mouth as needed for heartburn or indigestion.), Disp: 180 tablet, Rfl: 1   metoprolol succinate (TOPROL-XL) 25 MG 24 hr tablet, Take 0.5 tablets (12.5 mg total) by mouth daily. Take an extra metoprolol daily as needed., Disp: 45 tablet, Rfl: 3   Multiple Vitamin (MULTIVITAMIN WITH MINERALS) TABS tablet, Take 1 tablet by mouth every other day., Disp: , Rfl:    pravastatin (PRAVACHOL) 20 MG tablet, Take 1 tablet (20 mg total) by mouth every evening., Disp: 90 tablet, Rfl: 3   Vitamin D, Ergocalciferol, (DRISDOL) 1.25 MG (50000 UNIT) CAPS capsule, Take 1 capsule (50,000 Units total) by mouth every 7 (seven) days., Disp: 12 capsule, Rfl: 1  Observations/Objective: Patient is well-developed, well-nourished in no acute distress.  Resting comfortably at home.  Head is normocephalic, atraumatic.  No labored breathing. Speech is clear and coherent with logical content.  Patient is alert and oriented at baseline.   Assessment and Plan: 1. Acute bacterial sinusitis - fluticasone (FLONASE) 50 MCG/ACT nasal spray; Place 2 sprays into both nostrils daily.  Dispense: 16 g; Refill: 0 - doxycycline (VIBRA-TABS) 100  MG tablet; Take 1 tablet (100 mg total) by mouth 2 (two) times daily.  Dispense: 20 tablet; Refill: 0  Rx Doxycycline.  Increase fluids.  Rest.  Saline nasal spray.  Probiotic.  Mucinex as directed.  Humidifier in bedroom. Flonase per orders.  Call or return to clinic if symptoms are not improving.   Follow Up Instructions: I discussed the assessment and treatment plan with the patient. The patient was provided an opportunity to ask questions and all were answered. The patient agreed with the plan and demonstrated an understanding of the instructions.  A copy of instructions were sent to the patient via MyChart unless otherwise noted below.   The patient was advised to call back or seek an in-person evaluation if the symptoms worsen or if the  condition fails to improve as anticipated.  Time:  I spent 8 minutes with the patient via telehealth technology discussing the above problems/concerns.    Renee Rio, PA-C

## 2022-09-06 NOTE — Patient Instructions (Addendum)
Renee [name], thank you for joining [name], PA-C for today's virtual visit.  While this provider is not your primary care provider (PCP), if your PCP is located in our provider database this encounter information will be shared with them immediately following your visit.   [facility] account gives you access to today's visit and all your visits, tests, and labs performed at [facility] " click here if you don't have a [facility] account or go to [email or web address]  Consent: (Patient) Renee [name] provided verbal consent for this virtual visit at the beginning of the encounter.  Current Medications:  Current Outpatient Medications:    b complex vitamins tablet, Take 1 tablet by mouth once a week., Disp: , Rfl:    Coenzyme Q10 200 MG capsule, Take 200 mg by mouth once a week. , Disp: , Rfl:    famotidine (PEPCID) 20 MG tablet, TAKE 1 TABLET BY MOUTH TWICE A DAY (Patient taking differently: Take 20 mg by mouth as needed for heartburn or indigestion.), Disp: 180 tablet, Rfl: 1   metoprolol succinate (TOPROL-XL) 25 MG 24 hr tablet, Take 0.5 tablets (12.5 mg total) by mouth daily. Take an extra metoprolol daily as needed., Disp: 45 tablet, Rfl: 3   Multiple Vitamin (MULTIVITAMIN WITH MINERALS) TABS tablet, Take 1 tablet by mouth every other day., Disp: , Rfl:    pravastatin (PRAVACHOL) 20 MG tablet, Take 1 tablet (20 mg total) by mouth every evening., Disp: 90 tablet, Rfl: 3   Vitamin D, Ergocalciferol, (DRISDOL) 1.25 MG (50000 UNIT) CAPS capsule, Take 1 capsule (50,000 Units total) by mouth every 7 (seven) days., Disp: 12 capsule, Rfl: 1   Medications ordered in this encounter:  No orders of the defined types were placed in this encounter.    *If you need refills on other medications prior to your next appointment, please contact your pharmacy*  Follow-Up: Call back or seek an in-person evaluation if the symptoms worsen or if the condition  fails to improve as anticipated.  [facility] [phone]  Other Instructions Please take antibiotic as directed.  Increase fluid intake.  Use Saline nasal spray.  Take a daily multivitamin. Use the Flonase as directed.  Place a humidifier in the bedroom.  Please call or return clinic if symptoms are not improving.  Sinusitis Sinusitis is redness, soreness, and swelling (inflammation) of the paranasal sinuses. Paranasal sinuses are air pockets within the bones of your face (beneath the eyes, the middle of the forehead, or above the eyes). In healthy paranasal sinuses, mucus is able to drain out, and air is able to circulate through them by way of your nose. However, when your paranasal sinuses are inflamed, mucus and air can become trapped. This can allow bacteria and other germs to grow and cause infection. Sinusitis can develop quickly and last only a short time (acute) or continue over a long period (chronic). Sinusitis that lasts for more than 12 weeks is considered chronic.  CAUSES  Causes of sinusitis include: Allergies. Structural abnormalities, such as displacement of the cartilage that separates your nostrils (deviated septum), which can decrease the air flow through your nose and sinuses and affect sinus drainage. Functional abnormalities, such as when the small hairs (cilia) that line your sinuses and help remove mucus do not work properly or are not present. SYMPTOMS  Symptoms of acute and chronic sinusitis are the same. The primary symptoms are pain and pressure around the affected sinuses. Other  symptoms include: Upper toothache. Earache. Headache. Bad breath. Decreased sense of smell and taste. A cough, which worsens when you are lying flat. Fatigue. Fever. Thick drainage from your nose, which often is green and may contain pus (purulent). Swelling and warmth over the affected sinuses. DIAGNOSIS  Your caregiver will perform a physical exam. During the exam,  your caregiver may: Look in your nose for signs of abnormal growths in your nostrils (nasal polyps). Tap over the affected sinus to check for signs of infection. View the inside of your sinuses (endoscopy) with a special imaging device with a light attached (endoscope), which is inserted into your sinuses. If your caregiver suspects that you have chronic sinusitis, one or more of the following tests may be recommended: Allergy tests. Nasal culture A sample of mucus is taken from your nose and sent to a lab and screened for bacteria. Nasal cytology A sample of mucus is taken from your nose and examined by your caregiver to determine if your sinusitis is related to an allergy. TREATMENT  Most cases of acute sinusitis are related to a viral infection and will resolve on their own within 10 days. Sometimes medicines are prescribed to help relieve symptoms (pain medicine, decongestants, nasal steroid sprays, or saline sprays).  However, for sinusitis related to a bacterial infection, your caregiver will prescribe antibiotic medicines. These are medicines that will help kill the bacteria causing the infection.  Rarely, sinusitis is caused by a fungal infection. In theses cases, your caregiver will prescribe antifungal medicine. For some cases of chronic sinusitis, surgery is needed. Generally, these are cases in which sinusitis recurs more than 3 times per year, despite other treatments. HOME CARE INSTRUCTIONS  Drink plenty of water. Water helps thin the mucus so your sinuses can drain more easily. Use a humidifier. Inhale steam 3 to 4 times a day (for example, sit in the bathroom with the shower running). Apply a warm, moist washcloth to your face 3 to 4 times a day, or as directed by your caregiver. Use saline nasal sprays to help moisten and clean your sinuses. Take over-the-counter or prescription medicines for pain, discomfort, or fever only as directed by your caregiver. SEEK IMMEDIATE MEDICAL  CARE IF: You have increasing pain or severe headaches. You have nausea, vomiting, or drowsiness. You have swelling around your face. You have vision problems. You have a stiff neck. You have difficulty breathing. MAKE SURE YOU:  Understand these instructions. Will watch your condition. Will get help right away if you are not doing well or get worse. Document Released: 08/09/2005 Document Revised: 11/01/2011 Document Reviewed: 08/24/2011 [facility] Patient Information 2014 [place], [place].  If you have been instructed to have an in-person evaluation today at a local Urgent Care facility, please use the link below. It will take you to a list of all of our available [facility], including address, phone number and hours of operation. Please do not delay care.  [facility]  If you or a family member do not have a primary care provider, use the link below to schedule a visit and establish care. When you choose a [facility] primary care physician or advanced practice provider, you gain a long-term partner in health. Find a Primary Care Provider  Learn more about [facility]'s in-office and virtual care options: [facility]

## 2022-09-07 ENCOUNTER — Encounter: Payer: Self-pay | Admitting: Family Medicine

## 2022-09-08 ENCOUNTER — Other Ambulatory Visit: Payer: Self-pay | Admitting: Family Medicine

## 2022-09-08 MED ORDER — AZITHROMYCIN 250 MG PO TABS: ORAL_TABLET | ORAL | 0 refills | Status: AC

## 2022-09-13 ENCOUNTER — Other Ambulatory Visit (HOSPITAL_BASED_OUTPATIENT_CLINIC_OR_DEPARTMENT_OTHER): Payer: Self-pay | Admitting: Family Medicine

## 2022-09-13 DIAGNOSIS — Z1231 Encounter for screening mammogram for malignant neoplasm of breast: Secondary | ICD-10-CM

## 2022-09-28 ENCOUNTER — Ambulatory Visit (HOSPITAL_BASED_OUTPATIENT_CLINIC_OR_DEPARTMENT_OTHER)
Admission: RE | Admit: 2022-09-28 | Discharge: 2022-09-28 | Disposition: A | Discharge status: Home / Self Care | Payer: Medicare Other | Source: Ambulatory Visit | Attending: Family Medicine | Admitting: Family Medicine

## 2022-09-28 ENCOUNTER — Encounter (HOSPITAL_BASED_OUTPATIENT_CLINIC_OR_DEPARTMENT_OTHER): Payer: Self-pay

## 2022-09-28 DIAGNOSIS — Z1231 Encounter for screening mammogram for malignant neoplasm of breast: Secondary | ICD-10-CM | POA: Diagnosis not present

## 2022-09-29 DIAGNOSIS — D485 Neoplasm of uncertain behavior of skin: Secondary | ICD-10-CM | POA: Diagnosis not present

## 2022-09-29 DIAGNOSIS — R21 Rash and other nonspecific skin eruption: Secondary | ICD-10-CM | POA: Diagnosis not present

## 2022-09-29 DIAGNOSIS — L309 Dermatitis, unspecified: Secondary | ICD-10-CM | POA: Diagnosis not present

## 2022-10-04 DIAGNOSIS — L989 Disorder of the skin and subcutaneous tissue, unspecified: Secondary | ICD-10-CM | POA: Diagnosis not present

## 2022-10-05 ENCOUNTER — Telehealth: Payer: Medicare Other | Admitting: Family Medicine

## 2022-10-05 ENCOUNTER — Encounter: Payer: Self-pay | Admitting: Family Medicine

## 2022-10-05 DIAGNOSIS — J069 Acute upper respiratory infection, unspecified: Secondary | ICD-10-CM

## 2022-10-05 NOTE — Progress Notes (Signed)
Virtual Video Visit via MyChart Note  I connected with  Renee [name] on 10/05/22 at  8:40 AM EST by the video enabled telemedicine application for MyChart, and verified that I am speaking with the correct person using two identifiers.   I introduced myself as a Designer, jewellery with the practice. We discussed the limitations of evaluation and management by telemedicine and the availability of in person appointments. The patient expressed understanding and agreed to proceed.  Participating parties in this visit include: The patient and the nurse practitioner listed.  The patient is: At home I am: In the office - [facility]  Subjective:    CC: URI   HPI: Renee [name] is a 77 y.o. year old female presenting today via [facility] today for URI symptoms.   Patient reports she started with URI symptoms a few days ago. She took a COVID test last night, but couldn't see a control line on the test, so she repeated home testing this morning and it was negative. Symptoms have included nasal congestion, sore throat, ear pressure, chest congestion. She denies fevers, chills, nausea, vomiting ,diarrhea, body aches, chest pain, dyspnea.     Past medical history, Surgical history, Family history not pertinant except as noted below, Social history, Allergies, and medications have been entered into the medical record, reviewed, and corrections made.   Review of Systems:  All review of systems negative except what is listed in the HPI   Objective:    General:  Speaking clearly in complete sentences. Absent shortness of breath noted.   Alert and oriented x3.   Normal judgment.  Absent acute distress.   Impression and Recommendations:    1. Viral URI Consider viral URI vs allergic rhinitis.  Continue supportive measures including rest, hydration, humidifier use, steam showers, warm compresses to sinuses, warm liquids with lemon and honey, and over-the-counter  cough, cold, and analgesics as needed.  Recommend daily antihistamine, Flonase, Mucinex PRN.  If not improving by 8-10 day mark, follow-up for in-person evaluation.   Follow-up if symptoms worsen or fail to improve.   I discussed the assessment and treatment plan with the patient. The patient was provided an opportunity to ask questions and all were answered. The patient agreed with the plan and demonstrated an understanding of the instructions.   The patient was advised to call back or seek an in-person evaluation if the symptoms worsen or if the condition fails to improve as anticipated.    Terrilyn Saver, NP

## 2022-10-11 DIAGNOSIS — L408 Other psoriasis: Secondary | ICD-10-CM | POA: Diagnosis not present

## 2022-10-11 DIAGNOSIS — L218 Other seborrheic dermatitis: Secondary | ICD-10-CM | POA: Diagnosis not present

## 2022-11-01 ENCOUNTER — Other Ambulatory Visit: Payer: Medicare Other

## 2022-11-09 ENCOUNTER — Other Ambulatory Visit: Payer: Self-pay

## 2022-11-09 ENCOUNTER — Telehealth: Payer: Self-pay | Admitting: *Deleted

## 2022-11-09 ENCOUNTER — Other Ambulatory Visit (INDEPENDENT_AMBULATORY_CARE_PROVIDER_SITE_OTHER): Payer: Medicare Other

## 2022-11-09 DIAGNOSIS — R739 Hyperglycemia, unspecified: Secondary | ICD-10-CM

## 2022-11-09 DIAGNOSIS — E782 Mixed hyperlipidemia: Secondary | ICD-10-CM

## 2022-11-09 DIAGNOSIS — I10 Essential (primary) hypertension: Secondary | ICD-10-CM

## 2022-11-09 DIAGNOSIS — E559 Vitamin D deficiency, unspecified: Secondary | ICD-10-CM

## 2022-11-09 LAB — LIPID PANEL
Cholesterol: 222 mg/dL — ABNORMAL HIGH (ref 0–200)
HDL: 75.2 mg/dL (ref >39.00)
LDL Cholesterol: 123 mg/dL — ABNORMAL HIGH (ref 0–99)
NonHDL: 146.48
Total CHOL/HDL Ratio: 3
Triglycerides: 117 mg/dL (ref 0.0–149.0)
VLDL: 23.4 mg/dL (ref 0.0–40.0)

## 2022-11-09 LAB — CBC WITH DIFFERENTIAL/PLATELET
Basophils Absolute: 0.1 10*3/uL (ref 0.0–0.1)
Basophils Relative: 0.8 % (ref 0.0–3.0)
Eosinophils Absolute: 0.1 10*3/uL (ref 0.0–0.7)
Eosinophils Relative: 1.8 % (ref 0.0–5.0)
HCT: 45.4 % (ref 36.0–46.0)
Hemoglobin: 15.3 g/dL — ABNORMAL HIGH (ref 12.0–15.0)
Lymphocytes Relative: 20 % (ref 12.0–46.0)
Lymphs Abs: 1.4 10*3/uL (ref 0.7–4.0)
MCHC: 33.7 g/dL (ref 30.0–36.0)
MCV: 100.7 fl — ABNORMAL HIGH (ref 78.0–100.0)
Monocytes Absolute: 0.7 10*3/uL (ref 0.1–1.0)
Monocytes Relative: 9.6 % (ref 3.0–12.0)
Neutro Abs: 4.7 10*3/uL (ref 1.4–7.7)
Neutrophils Relative %: 67.8 % (ref 43.0–77.0)
Platelets: 246 10*3/uL (ref 150.0–400.0)
RBC: 4.52 Mil/uL (ref 3.87–5.11)
RDW: 15.1 % (ref 11.5–15.5)
WBC: 6.9 10*3/uL (ref 4.0–10.5)

## 2022-11-09 LAB — VITAMIN D 25 HYDROXY (VIT D DEFICIENCY, FRACTURES): VITD: 21.76 ng/mL — ABNORMAL LOW (ref 30.00–100.00)

## 2022-11-09 LAB — TSH: TSH: 1.82 u[IU]/mL (ref 0.35–5.50)

## 2022-11-09 LAB — HEMOGLOBIN A1C: Hgb A1c MFr Bld: 6 % (ref 4.6–6.5)

## 2022-11-09 NOTE — Telephone Encounter (Signed)
Pt came in for lab visit this morning.  No future orders in EPIC.  Last lab result note said to repeat labs in 3 months so I drew a gold top and 2 lavender top tubes.  Can you place future lab orders please?

## 2022-11-09 NOTE — Telephone Encounter (Signed)
Labs order placed.

## 2022-11-11 ENCOUNTER — Other Ambulatory Visit: Payer: Self-pay

## 2022-11-11 MED ORDER — VITAMIN D (ERGOCALCIFEROL) 1.25 MG (50000 UNIT) PO CAPS: 50000.0000 [IU] | ORAL_CAPSULE | ORAL | 1 refills | Status: DC

## 2022-12-06 ENCOUNTER — Encounter: Payer: Self-pay | Admitting: *Deleted

## 2023-01-31 NOTE — Assessment & Plan Note (Signed)
Encouraged to get adequate exercise, calcium and vitamin d intake 

## 2023-01-31 NOTE — Assessment & Plan Note (Signed)
Supplement and monitor 

## 2023-01-31 NOTE — Assessment & Plan Note (Signed)
hgba1c acceptable, minimize simple carbs. Increase exercise as tolerated.  

## 2023-01-31 NOTE — Assessment & Plan Note (Signed)
Well controlled, no changes to meds. Encouraged heart healthy diet such as the DASH diet and exercise as tolerated.  °

## 2023-01-31 NOTE — Assessment & Plan Note (Signed)
Encourage heart healthy diet such as MIND or DASH diet, increase exercise, avoid trans fats, simple carbohydrates and processed foods, consider a krill or fish or flaxseed oil cap daily.  °

## 2023-02-01 ENCOUNTER — Ambulatory Visit (INDEPENDENT_AMBULATORY_CARE_PROVIDER_SITE_OTHER): Payer: Medicare Other | Admitting: Family Medicine

## 2023-02-01 VITALS — BP 128/80 | HR 81 | Temp 98.0°F | Resp 16 | Ht 66.0 in | Wt 222.6 lb

## 2023-02-01 DIAGNOSIS — R0789 Other chest pain: Secondary | ICD-10-CM

## 2023-02-01 DIAGNOSIS — R739 Hyperglycemia, unspecified: Secondary | ICD-10-CM | POA: Diagnosis not present

## 2023-02-01 DIAGNOSIS — E782 Mixed hyperlipidemia: Secondary | ICD-10-CM

## 2023-02-01 DIAGNOSIS — E559 Vitamin D deficiency, unspecified: Secondary | ICD-10-CM | POA: Diagnosis not present

## 2023-02-01 DIAGNOSIS — G250 Essential tremor: Secondary | ICD-10-CM

## 2023-02-01 DIAGNOSIS — I10 Essential (primary) hypertension: Secondary | ICD-10-CM

## 2023-02-01 DIAGNOSIS — M791 Myalgia, unspecified site: Secondary | ICD-10-CM | POA: Diagnosis not present

## 2023-02-01 DIAGNOSIS — M8580 Other specified disorders of bone density and structure, unspecified site: Secondary | ICD-10-CM | POA: Diagnosis not present

## 2023-02-01 NOTE — Patient Instructions (Signed)

## 2023-02-01 NOTE — Progress Notes (Signed)
Subjective:   By signing my name below, I, Renee [name], attest that this documentation has been prepared under the direction and in the presence of Renee Canary, MD., 02/01/2023.   Patient ID: Renee [name], female    DOB: 03-31-1946, 77 y.o.   MRN: [id number]  No chief complaint on file.  HPI Patient is in today for an office visit. She denies recent hospitalization, febrile illness, CP/palpitations/SOB/HA/fever/chills/GU symptoms.  Hyperglycemia with Obesity Patient's HGBA1C was last checked on 11/09/2022 at 6.0%. She admits that since then, she has not been maintaining a healthy diet nor exercising. She has also gained weight since her last visit which has been causing intermittent right hip pain. She has not noticed any pattern to this pain but is now interested in losing weight. She has seen sports medic Dr. Clare [name] in the past and is considering seeing him again. Body mass index is 35.93 kg/m. Wt Readings from Last 3 Encounters:  02/01/23 222 lb 9.6 oz (101 kg)  07/29/22 220 lb (99.8 kg)  03/31/22 220 lb (99.8 kg)   Epigastric Pain Patient reports that she has been experiencing intermittent epigastric pain for the past several months. She further reports that she occasionally experiences epigastric and substernal pressure when sitting for prolonged periods. She denies nausea, vomiting, diarrhea, constipation, or blood in stool today. She suspects this is a GI problem but is also concerned about her cardiac health. She follows with gastroenterologist Dr. Stan [name] and currently takes Famotidine 20 mg twice daily to manage heartburn. She was previously following with cardiologist Dr. Norman [name] who she last saw on 03/31/2022. During this visit, she was stopped on Atorvastatin 10 due to myalgias and prescribed Pravastatin 20 mg. However, she continues to take her remaining Atorvastatin pills, taking 5 mg every other day, and continues to experience myalgias. Furthermore,  she reports that last week, while shopping, she had an episode of lightheadedness and unsteadiness. This has not repeated but she will follow with cardiologist Dr. Gypsy [name] if this occurs again.  Past Medical History:  Diagnosis Date   Abdominal pain 07/05/2017   ABNORMAL INVOLUNTARY MOVEMENTS 01/01/2009   Qualifier: Diagnosis of  By: Alwyn Ren MD, William     Allergic rhinitis 09/19/2008   Qualifier: Diagnosis of  By: Nena Jordan    Allergy    Arthralgia of right wrist 05/06/2020   Atypical chest pain 09/19/2008   Qualifier: Diagnosis of  By: Nena Jordan    Cervical cancer screening 05/08/2013   Menarche 11, regular Menopause 50s G2P2 s/p 2 svd No abnormal paps or [employer] Fatty cyst on labia removed.    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   Qualifier: Diagnosis of  By: Nena Jordan    Dermatitis 09/17/2018   Diverticulosis 05/06/2017   Essential hypertension 09/19/2008   Qualifier: Diagnosis of  By: Nena Jordan    GERD (gastroesophageal reflux disease)    H/O osteopenia 02/08/2016   H/O tobacco use, presenting hazards to health 09/19/2008   Qualifier: Diagnosis of  By: Nena Jordan Smoking roughly 5 cig daily  Last cigarette was in December of 2016   [name] tremor 12/27/2016   Hearing loss in right ear 09/19/2008   Qualifier: Diagnosis of  By: Nena Jordan  Hx of adenomatous colonic polyps 01/27/2018   Hyperglycemia 10/07/2016   Hyperlipidemia, mixed 10/03/2008   Qualifier: Diagnosis of  By: Nena Jordan    Lumbar radiculopathy 07/06/2021   Neuromuscular disorder (HCC)    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 Fixed sigmoid colon  prevents. Referred for surgery.   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 02/09/2017   Tinnitus 12/12/2017   Tremor, essential    Vertigo    Vertigo     Past Surgical History:  Procedure Laterality Date   INNER EAR SURGERY  2015   with tube   MOUTH SURGERY  01/22/11   lower denture secured by titanium bolts   rectal suction biospy     XI ROBOTIC ASSISTED LOWER ANTERIOR RESECTION N/A 02/09/2017   Procedure: XI ROBOTIC RESECTION OF SIGMOID COLON, RIGID PROCTOSCOPY;  Surgeon: Karie Soda, MD;  Location: [facility];  Service: General;  Laterality: N/A;    Family History  Problem Relation Age of Onset   Stroke Mother    Hypertension Mother    Alcohol abuse Mother    Tremor Mother    Heart attack Father 30       deceased   Heart disease Paternal Grandmother        AAA rupture   Heart disease Paternal Grandfather        MI   Obesity Daughter    Colon cancer Neg Hx    Breast cancer Neg Hx    Prostate cancer Neg Hx    Diabetes Neg Hx    Liver cancer Neg Hx    Rectal cancer Neg Hx    Stomach cancer Neg Hx     Social History   Socioeconomic History   Marital status: Married    Spouse name: Not on file   Number of children: 2   Years of education: Not on file   Highest education level: Not on file  Occupational History   Occupation: retired    Comment: banking  Tobacco Use   Smoking status: Former    Packs/day: .5    Types: Cigarettes    Quit date: 09/07/2015    Years since quitting: 7.4   Smokeless tobacco: Never  Vaping Use   Vaping Use: Never used  Substance and Sexual Activity   Alcohol use: Yes     Comment: 2 glasses wine/day   Drug use: No   Sexual activity: Yes  Other Topics Concern   Not on file  Social History Narrative   Married, retired   2 children   Former smoker no drug use, does drink alcohol   Right handed   Social Determinants of Health   Financial Resource Strain: Low Risk  (01/19/2022)   Overall Financial Resource Strain (CARDIA)    Difficulty of Paying Living Expenses: Not hard at all  Food Insecurity: No Food Insecurity (01/19/2022)   Hunger Vital Sign    Worried About Running Out of  Food in the Last Year: Never true    Ran Out of Food in the Last Year: Never true  Transportation Needs: No Transportation Needs (01/19/2022)   PRAPARE - Administrator, Civil Service (Medical): No    Lack of Transportation (Non-Medical): No  Physical Activity: Sufficiently Active (01/19/2022)   Exercise Vital Sign    Days of Exercise per Week: 7 days    Minutes of Exercise per Session: 30 min  Stress: No Stress Concern Present (01/19/2022)   [employer] of Occupational Health - Occupational Stress Questionnaire    Feeling of Stress : Not at all  Social Connections: Socially Integrated (01/19/2022)   Social Connection and Isolation Panel [NHANES]    Frequency of Communication with Friends and Family: More than three times a week    Frequency of Social Gatherings with Friends and Family: Once a week    Attends Religious Services: More than 4 times per year    Active Member of [employer] or Organizations: Yes    Attends Engineer, structural: More than 4 times per year    Marital Status: Married  Catering manager Violence: Not At Risk (01/19/2022)   Humiliation, Afraid, Rape, and Kick questionnaire    Fear of Current or Ex-Partner: No    Emotionally Abused: No    Physically Abused: No    Sexually Abused: No    Outpatient Medications Prior to Visit  Medication Sig Dispense Refill   b complex vitamins tablet Take 1 tablet by mouth once a week.     Coenzyme  Q10 200 MG capsule Take 200 mg by mouth once a week.      famotidine (PEPCID) 20 MG tablet TAKE 1 TABLET BY MOUTH TWICE A DAY (Patient taking differently: Take 20 mg by mouth as needed for heartburn or indigestion.) 180 tablet 1   fluticasone (FLONASE) 50 MCG/ACT nasal spray Place 2 sprays into both nostrils daily. 16 g 0   metoprolol succinate (TOPROL-XL) 25 MG 24 hr tablet Take 0.5 tablets (12.5 mg total) by mouth daily. Take an extra metoprolol daily as needed. 45 tablet 3   Multiple Vitamin (MULTIVITAMIN WITH MINERALS) TABS tablet Take 1 tablet by mouth every other day.     pravastatin (PRAVACHOL) 20 MG tablet Take 1 tablet (20 mg total) by mouth every evening. 90 tablet 3   Vitamin D, Ergocalciferol, (DRISDOL) 1.25 MG (50000 UNIT) CAPS capsule Take 1 capsule (50,000 Units total) by mouth every 7 (seven) days. 12 capsule 1   No facility-administered medications prior to visit.    Allergies  Allergen Reactions   Amoxicillin     REACTION: TROUBLE BREATHING Has patient had a PCN reaction causing immediate rash, facial/tongue/throat swelling, SOB or lightheadedness with hypotension:Yes Has patient had a PCN reaction causing severe rash involving mucus membranes or skin necrosis:No Has patient had a PCN reaction that required hospitalization:No Has patient had a PCN reaction occurring within the last 10 years:No If all of the above answers are "NO", then may proceed with Cephalosporin use.    Proair Hfa [Albuterol] Other (See Comments)    Hypersensitivity, anxious, disoriented   Penicillins Rash    Has patient had a PCN reaction causing immediate rash, facial/tongue/throat swelling, SOB or lightheadedness with hypotension:unknown Has patient had a PCN reaction causing severe rash involving mucus membranes or skin necrosis:unknown Has patient had a PCN reaction that required hospitalization:no Has patient had a PCN reaction occurring within the last 10 years:no If all of the above answers  are "NO", then may proceed with Cephalosporin use.     Review of Systems  Constitutional:  Negative for chills and fever.  Respiratory:  Negative for shortness of breath.   Cardiovascular:  Negative for chest pain and palpitations.  Gastrointestinal:  Positive for abdominal pain (epigastric pain). Negative for blood in stool, constipation, diarrhea, nausea and vomiting.  Genitourinary:  Negative for dysuria, frequency, hematuria and urgency.  Musculoskeletal:  Positive for joint pain (right hip pain) and myalgias.  Skin:           Neurological:  Negative for headaches.       Objective:    Physical Exam Constitutional:      General: She is not in acute distress.    Appearance: Normal appearance. She is obese. She is not ill-appearing.  HENT:     [name]: Normocephalic and atraumatic.     Right Ear: External ear normal.     Left Ear: External ear normal.     Nose: Nose normal.     Mouth/Throat:     Mouth: Mucous membranes are moist.     Pharynx: Oropharynx is clear.  Eyes:     General:        Right eye: No discharge.        Left eye: No discharge.     Extraocular Movements: Extraocular movements intact.     Conjunctiva/sclera: Conjunctivae normal.     Pupils: Pupils are equal, round, and reactive to light.  Cardiovascular:     Rate and Rhythm: Normal rate and regular rhythm.     Pulses: Normal pulses.     Heart sounds: Normal heart sounds. No murmur heard.    No gallop.  Pulmonary:     Effort: Pulmonary effort is normal. No respiratory distress.     Breath sounds: Normal breath sounds. No wheezing or rales.  Abdominal:     General: Bowel sounds are normal.     Palpations: Abdomen is soft.     Tenderness: There is no abdominal tenderness. There is no guarding.  Musculoskeletal:        General: Normal range of motion.     Cervical back: Normal range of motion.     Right lower leg: No edema.     Left lower leg: No edema.  Skin:    General: Skin is warm and dry.   Neurological:     Mental Status: She is alert and oriented to person, place, and time.  Psychiatric:        Mood and Affect: Mood normal.        Behavior: Behavior normal.        Judgment: Judgment normal.     There were no vitals taken for this visit. Wt Readings from Last 3 Encounters:  07/29/22 220 lb (99.8 kg)  03/31/22 220 lb (99.8 kg)  01/14/22 214 lb 12.8 oz (97.4 kg)    Diabetic Foot Exam - Simple   No data filed    Lab Results  Component Value Date   WBC 6.9 11/09/2022   HGB 15.3 (H) 11/09/2022   HCT 45.4 11/09/2022   PLT 246.0 11/09/2022   GLUCOSE 100 (H) 07/29/2022   CHOL 222 (H) 11/09/2022   TRIG 117.0 11/09/2022   HDL 75.20 11/09/2022   LDLDIRECT 148.6 09/26/2008   LDLCALC 123 (H) 11/09/2022   ALT 19 07/29/2022   AST 18 07/29/2022   NA 137 07/29/2022   K 4.4 07/29/2022   CL 102 07/29/2022   CREATININE 0.78 07/29/2022  BUN 14 07/29/2022   CO2 26 07/29/2022   TSH 1.82 11/09/2022   HGBA1C 6.0 11/09/2022    Lab Results  Component Value Date   TSH 1.82 11/09/2022   Lab Results  Component Value Date   WBC 6.9 11/09/2022   HGB 15.3 (H) 11/09/2022   HCT 45.4 11/09/2022   MCV 100.7 (H) 11/09/2022   PLT 246.0 11/09/2022   Lab Results  Component Value Date   NA 137 07/29/2022   K 4.4 07/29/2022   CO2 26 07/29/2022   GLUCOSE 100 (H) 07/29/2022   BUN 14 07/29/2022   CREATININE 0.78 07/29/2022   BILITOT 0.7 07/29/2022   ALKPHOS 86 07/29/2022   AST 18 07/29/2022   ALT 19 07/29/2022   PROT 7.1 07/29/2022   ALBUMIN 4.3 07/29/2022   CALCIUM 9.3 07/29/2022   ANIONGAP 6 02/10/2017   GFR 73.74 07/29/2022   Lab Results  Component Value Date   CHOL 222 (H) 11/09/2022   Lab Results  Component Value Date   HDL 75.20 11/09/2022   Lab Results  Component Value Date   LDLCALC 123 (H) 11/09/2022   Lab Results  Component Value Date   TRIG 117.0 11/09/2022   Lab Results  Component Value Date   CHOLHDL 3 11/09/2022   Lab Results  Component  Value Date   HGBA1C 6.0 11/09/2022      Assessment & Plan:  Epigastric Pain: Recommended daily fiber supplements and probiotic. Referral placed to cardiologist Dr. Gypsy [name] and encouraged patient to follow with gastroenterologist Dr. Stan [name].  Essential Hypertension: This is monitored and controlled with Metoprolol Succinate 12.5 mg daily with an additional 12.5 mg as needed. There were no medication adjustments today.  Hyperglycemia with Obesity: This is monitored and controlled. Encouraged 6-8 hours of sleep, heart healthy diet, 60-80 oz of non-alcohol/non-caffeinated fluids, and 4000-8000 steps daily. Recommended [place] [facility] [employer] [facility] program.  Hyperlipidemia: This is monitored and controlled with Atorvastatin 5 mg every other day. There were no medication adjustments today.  Labs: Routine blood work ordered and will include CPK and magnesium due to myalgias.   Vitamin D Deficiency: This is monitored and managed with vitamin D 50,000 IU once weekly. Problem List Items Addressed This Visit       Cardiovascular and Mediastinum   Essential hypertension - Primary     Musculoskeletal and Integument   Osteopenia     Other   Hyperglycemia   Hyperlipidemia, mixed   Vitamin D deficiency   No orders of the defined types were placed in this encounter.  I, Renee [name], personally preformed the services described in this documentation.  All medical record entries made by the scribe were at my direction and in my presence.  I have reviewed the chart and discharge instructions (if applicable) and agree that the record reflects my personal performance and is accurate and complete. 02/01/2023  I,Mohammed Iqbal,acting as a scribe for Danise Edge, MD.,have documented all relevant documentation on the behalf of Danise Edge, MD,as directed by  Danise Edge, MD while in the presence of Danise Edge, MD.  Renee [name]

## 2023-02-02 ENCOUNTER — Encounter: Payer: Self-pay | Admitting: Family Medicine

## 2023-02-02 DIAGNOSIS — M791 Myalgia, unspecified site: Secondary | ICD-10-CM

## 2023-02-02 HISTORY — DX: Myalgia, unspecified site: M79.10

## 2023-02-02 NOTE — Assessment & Plan Note (Signed)
Stable, not debilitating

## 2023-02-02 NOTE — Assessment & Plan Note (Signed)
Hydrate and monitor 

## 2023-02-02 NOTE — Assessment & Plan Note (Signed)
Vs epigastric pain has often occurred with sitting and pressure. She notes it started after weight gain. Avoid offending foods, start probiotics. Do not eat large meals in late evening and consider raising head of bed.  But also given risk factors she is referred back to cardiology for further consideration.

## 2023-02-10 ENCOUNTER — Other Ambulatory Visit (INDEPENDENT_AMBULATORY_CARE_PROVIDER_SITE_OTHER): Payer: Medicare Other

## 2023-02-10 DIAGNOSIS — M791 Myalgia, unspecified site: Secondary | ICD-10-CM | POA: Diagnosis not present

## 2023-02-10 DIAGNOSIS — I10 Essential (primary) hypertension: Secondary | ICD-10-CM

## 2023-02-10 DIAGNOSIS — E782 Mixed hyperlipidemia: Secondary | ICD-10-CM | POA: Diagnosis not present

## 2023-02-10 DIAGNOSIS — R739 Hyperglycemia, unspecified: Secondary | ICD-10-CM | POA: Diagnosis not present

## 2023-02-10 DIAGNOSIS — E559 Vitamin D deficiency, unspecified: Secondary | ICD-10-CM

## 2023-02-10 LAB — LIPID PANEL
Cholesterol: 243 mg/dL — ABNORMAL HIGH (ref 0–200)
HDL: 70 mg/dL (ref >39.00)
LDL Cholesterol: 145 mg/dL — ABNORMAL HIGH (ref 0–99)
NonHDL: 173.02
Total CHOL/HDL Ratio: 3
Triglycerides: 140 mg/dL (ref 0.0–149.0)
VLDL: 28 mg/dL (ref 0.0–40.0)

## 2023-02-10 LAB — CBC WITH DIFFERENTIAL/PLATELET
Basophils Absolute: 0.1 10*3/uL (ref 0.0–0.1)
Basophils Relative: 0.9 % (ref 0.0–3.0)
Eosinophils Absolute: 0.1 10*3/uL (ref 0.0–0.7)
Eosinophils Relative: 2.2 % (ref 0.0–5.0)
HCT: 45.3 % (ref 36.0–46.0)
Hemoglobin: 14.8 g/dL (ref 12.0–15.0)
Lymphocytes Relative: 26.6 % (ref 12.0–46.0)
Lymphs Abs: 1.7 10*3/uL (ref 0.7–4.0)
MCHC: 32.7 g/dL (ref 30.0–36.0)
MCV: 101.5 fl — ABNORMAL HIGH (ref 78.0–100.0)
Monocytes Absolute: 0.6 10*3/uL (ref 0.1–1.0)
Monocytes Relative: 8.8 % (ref 3.0–12.0)
Neutro Abs: 3.9 10*3/uL (ref 1.4–7.7)
Neutrophils Relative %: 61.5 % (ref 43.0–77.0)
Platelets: 238 10*3/uL (ref 150.0–400.0)
RBC: 4.47 Mil/uL (ref 3.87–5.11)
RDW: 14.9 % (ref 11.5–15.5)
WBC: 6.3 10*3/uL (ref 4.0–10.5)

## 2023-02-10 LAB — CK: Total CK: 56 U/L (ref 7–177)

## 2023-02-10 LAB — COMPREHENSIVE METABOLIC PANEL
ALT: 22 U/L (ref 0–35)
AST: 18 U/L (ref 0–37)
Albumin: 4.1 g/dL (ref 3.5–5.2)
Alkaline Phosphatase: 81 U/L (ref 39–117)
BUN: 15 mg/dL (ref 6–23)
CO2: 24 mEq/L (ref 19–32)
Calcium: 8.8 mg/dL (ref 8.4–10.5)
Chloride: 104 mEq/L (ref 96–112)
Creatinine, Ser: 0.71 mg/dL (ref 0.40–1.20)
GFR: 82.24 mL/min (ref >60.00)
Glucose, Bld: 113 mg/dL — ABNORMAL HIGH (ref 70–99)
Potassium: 4.3 mEq/L (ref 3.5–5.1)
Sodium: 137 mEq/L (ref 135–145)
Total Bilirubin: 0.9 mg/dL (ref 0.2–1.2)
Total Protein: 7 g/dL (ref 6.0–8.3)

## 2023-02-10 LAB — MAGNESIUM: Magnesium: 2.2 mg/dL (ref 1.5–2.5)

## 2023-02-10 LAB — TSH: TSH: 1.5 u[IU]/mL (ref 0.35–5.50)

## 2023-02-10 LAB — HEMOGLOBIN A1C: Hgb A1c MFr Bld: 5.9 % (ref 4.6–6.5)

## 2023-02-10 LAB — VITAMIN D 25 HYDROXY (VIT D DEFICIENCY, FRACTURES): VITD: 36.46 ng/mL (ref 30.00–100.00)

## 2023-02-16 DIAGNOSIS — L408 Other psoriasis: Secondary | ICD-10-CM | POA: Diagnosis not present

## 2023-02-16 DIAGNOSIS — L820 Inflamed seborrheic keratosis: Secondary | ICD-10-CM | POA: Diagnosis not present

## 2023-03-09 ENCOUNTER — Ambulatory Visit: Payer: Medicare Other | Admitting: Emergency Medicine

## 2023-03-09 VITALS — Ht 66.5 in | Wt 222.0 lb

## 2023-03-09 DIAGNOSIS — Z0000 Encounter for general adult medical examination without abnormal findings: Secondary | ICD-10-CM | POA: Diagnosis not present

## 2023-03-09 NOTE — Patient Instructions (Addendum)
Ms. Arrambide , Thank you for taking time to come for your Medicare Wellness Visit. I appreciate your ongoing commitment to your health goals. Please review the following plan we discussed and let me know if I can assist you in the future.   These are the goals we discussed:  Goals      Weight (lb) < 200 lb (90.7 kg)        This is a list of the screening recommended for you and due dates:  Health Maintenance  Topic Date Due   Hepatitis C Screening  Never done   DTaP/Tdap/Td vaccine (1 - Tdap) Never done   Zoster (Shingles) Vaccine (1 of 2) Never done   Screening for Lung Cancer  Never done   Colon Cancer Screening  01/18/2023   Flu Shot  03/24/2023   DEXA scan (bone density measurement)  08/12/2023   Mammogram  09/29/2023   Medicare Annual Wellness Visit  03/08/2024   Pneumonia Vaccine  Completed   HPV Vaccine  Aged Out   COVID-19 Vaccine  Discontinued    Advanced directives: on file  Conditions/risks identified: Follow up with Gastro to discuss need for colonoscopy. Call to make an eye exam with Dr. Porfirio Mylar. Discuss with Dr. Abner Greenspan the low dose CT scan for lung cancer screening, Tetanus and Shingrix as discussed.   Next appointment: Follow up in one year for your annual wellness visit 03/15/23   Preventive Care 65 Years and Older, Female Preventive care refers to lifestyle choices and visits with your health care provider that can promote health and wellness. What does preventive care include? A yearly physical exam. This is also called an annual well check. Dental exams once or twice a year. Routine eye exams. Ask your health care provider how often you should have your eyes checked. Personal lifestyle choices, including: Daily care of your teeth and gums. Regular physical activity. Eating a healthy diet. Avoiding tobacco and drug use. Limiting alcohol use. Practicing safe sex. Taking low-dose aspirin every day. Taking vitamin and mineral supplements as recommended by  your health care provider. What happens during an annual well check? The services and screenings done by your health care provider during your annual well check will depend on your age, overall health, lifestyle risk factors, and family history of disease. Counseling  Your health care provider may ask you questions about your: Alcohol use. Tobacco use. Drug use. Emotional well-being. Home and relationship well-being. Sexual activity. Eating habits. History of falls. Memory and ability to understand (cognition). Work and work Astronomer. Reproductive health. Screening  You may have the following tests or measurements: Height, weight, and BMI. Blood pressure. Lipid and cholesterol levels. These may be checked every 5 years, or more frequently if you are over 37 years old. Skin check. Lung cancer screening. You may have this screening every year starting at age 10 if you have a 30-pack-year history of smoking and currently smoke or have quit within the past 15 years. Fecal occult blood test (FOBT) of the stool. You may have this test every year starting at age 26. Flexible sigmoidoscopy or colonoscopy. You may have a sigmoidoscopy every 5 years or a colonoscopy every 10 years starting at age 34. Hepatitis C blood test. Hepatitis B blood test. Sexually transmitted disease (STD) testing. Diabetes screening. This is done by checking your blood sugar (glucose) after you have not eaten for a while (fasting). You may have this done every 1-3 years. Bone density scan. This is done to screen  for osteoporosis. You may have this done starting at age 80. Mammogram. This may be done every 1-2 years. Talk to your health care provider about how often you should have regular mammograms. Talk with your health care provider about your test results, treatment options, and if necessary, the need for more tests. Vaccines  Your health care provider may recommend certain vaccines, such as: Influenza  vaccine. This is recommended every year. Tetanus, diphtheria, and acellular pertussis (Tdap, Td) vaccine. You may need a Td booster every 10 years. Zoster vaccine. You may need this after age 19. Pneumococcal 13-valent conjugate (PCV13) vaccine. One dose is recommended after age 56. Pneumococcal polysaccharide (PPSV23) vaccine. One dose is recommended after age 61. Talk to your health care provider about which screenings and vaccines you need and how often you need them. This information is not intended to replace advice given to you by your health care provider. Make sure you discuss any questions you have with your health care provider. Document Released: 09/05/2015 Document Revised: 04/28/2016 Document Reviewed: 06/10/2015 Elsevier Interactive Patient Education  2017 [employer].  Fall Prevention in the Home Falls can cause injuries. They can happen to people of all ages. There are many things you can do to make your home safe and to help prevent falls. What can I do on the outside of my home? Regularly fix the edges of walkways and driveways and fix any cracks. Remove anything that might make you trip as you walk through a door, such as a raised step or threshold. Trim any bushes or trees on the path to your home. Use bright outdoor lighting. Clear any walking paths of anything that might make someone trip, such as rocks or tools. Regularly check to see if handrails are loose or broken. Make sure that both sides of any steps have handrails. Any raised decks and porches should have guardrails on the edges. Have any leaves, snow, or ice cleared regularly. Use sand or salt on walking paths during winter. Clean up any spills in your garage right away. This includes oil or grease spills. What can I do in the bathroom? Use night lights. Install grab bars by the toilet and in the tub and shower. Do not use towel bars as grab bars. Use non-skid mats or decals in the tub or shower. If you  need to sit down in the shower, use a plastic, non-slip stool. Keep the floor dry. Clean up any water that spills on the floor as soon as it happens. Remove soap buildup in the tub or shower regularly. Attach bath mats securely with double-sided non-slip rug tape. Do not have throw rugs and other things on the floor that can make you trip. What can I do in the bedroom? Use night lights. Make sure that you have a light by your bed that is easy to reach. Do not use any sheets or blankets that are too big for your bed. They should not hang down onto the floor. Have a firm chair that has side arms. You can use this for support while you get dressed. Do not have throw rugs and other things on the floor that can make you trip. What can I do in the kitchen? Clean up any spills right away. Avoid walking on wet floors. Keep items that you use a lot in easy-to-reach places. If you need to reach something above you, use a strong step stool that has a grab bar. Keep electrical cords out of the way. Do  not use floor polish or wax that makes floors slippery. If you must use wax, use non-skid floor wax. Do not have throw rugs and other things on the floor that can make you trip. What can I do with my stairs? Do not leave any items on the stairs. Make sure that there are handrails on both sides of the stairs and use them. Fix handrails that are broken or loose. Make sure that handrails are as long as the stairways. Check any carpeting to make sure that it is firmly attached to the stairs. Fix any carpet that is loose or worn. Avoid having throw rugs at the top or bottom of the stairs. If you do have throw rugs, attach them to the floor with carpet tape. Make sure that you have a light switch at the top of the stairs and the bottom of the stairs. If you do not have them, ask someone to add them for you. What else can I do to help prevent falls? Wear shoes that: Do not have high heels. Have rubber  bottoms. Are comfortable and fit you well. Are closed at the toe. Do not wear sandals. If you use a stepladder: Make sure that it is fully opened. Do not climb a closed stepladder. Make sure that both sides of the stepladder are locked into place. Ask someone to hold it for you, if possible. Clearly mark and make sure that you can see: Any grab bars or handrails. First and last steps. Where the edge of each step is. Use tools that help you move around (mobility aids) if they are needed. These include: Canes. Walkers. Scooters. Crutches. Turn on the lights when you go into a dark area. Replace any light bulbs as soon as they burn out. Set up your furniture so you have a clear path. Avoid moving your furniture around. If any of your floors are uneven, fix them. If there are any pets around you, be aware of where they are. Review your medicines with your doctor. Some medicines can make you feel dizzy. This can increase your chance of falling. Ask your doctor what other things that you can do to help prevent falls. This information is not intended to replace advice given to you by your health care provider. Make sure you discuss any questions you have with your health care provider. Document Released: 06/05/2009 Document Revised: 01/15/2016 Document Reviewed: 09/13/2014 Elsevier Interactive Patient Education  2017 [employer]   Calorie Counting for [employer] Calories are units of energy. Your body needs a certain number of calories from food to keep going throughout the day. When you eat or drink more calories than your body needs, your body stores the extra calories mostly as fat. When you eat or drink fewer calories than your body needs, your body burns fat to get the energy it needs. Calorie counting means keeping track of how many calories you eat and drink each day. Calorie counting can be helpful if you need to lose weight. If you eat fewer calories than your body needs, you should  lose weight. Ask your health care provider what a healthy weight is for you. For calorie counting to work, you will need to eat the right number of calories each day to lose a healthy amount of weight per week. A dietitian can help you figure out how many calories you need in a day and will suggest ways to reach your calorie goal. A healthy amount of weight to lose each week  is usually 1-2 lb (0.5-0.9 kg). This usually means that your daily calorie intake should be reduced by 500-750 calories. Eating 1,200-1,500 calories a day can help most women lose weight. Eating 1,500-1,800 calories a day can help most men lose weight. What do I need to know about calorie counting? Work with your health care provider or dietitian to determine how many calories you should get each day. To meet your daily calorie goal, you will need to: Find out how many calories are in each food that you would like to eat. Try to do this before you eat. Decide how much of the food you plan to eat. Keep a food log. Do this by writing down what you ate and how many calories it had. To successfully lose weight, it is important to balance calorie counting with a healthy lifestyle that includes regular activity. Where do I find calorie information?  The number of calories in a food can be found on a Nutrition Facts label. If a food does not have a Nutrition Facts label, try to look up the calories online or ask your dietitian for help. Remember that calories are listed per serving. If you choose to have more than one serving of a food, you will have to multiply the calories per serving by the number of servings you plan to eat. For example, the label on a package of bread might say that a serving size is 1 slice and that there are 90 calories in a serving. If you eat 1 slice, you will have eaten 90 calories. If you eat 2 slices, you will have eaten 180 calories. How do I keep a food log? After each time that you eat, record the  following in your food log as soon as possible: What you ate. Be sure to include toppings, sauces, and other extras on the food. How much you ate. This can be measured in cups, ounces, or number of items. How many calories were in each food and drink. The total number of calories in the food you ate. Keep your food log near you, such as in a pocket-sized notebook or on an app or website on your mobile phone. Some programs will calculate calories for you and show you how many calories you have left to meet your daily goal. What are some portion-control tips? Know how many calories are in a serving. This will help you know how many servings you can have of a certain food. Use a measuring cup to measure serving sizes. You could also try weighing out portions on a kitchen scale. With time, you will be able to estimate serving sizes for some foods. Take time to put servings of different foods on your favorite plates or in your favorite bowls and cups so you know what a serving looks like. Try not to eat straight from a food's packaging, such as from a bag or box. Eating straight from the package makes it hard to see how much you are eating and can lead to overeating. Put the amount you would like to eat in a cup or on a plate to make sure you are eating the right portion. Use smaller plates, glasses, and bowls for smaller portions and to prevent overeating. Try not to multitask. For example, avoid watching TV or using your computer while eating. If it is time to eat, sit down at a table and enjoy your food. This will help you recognize when you are full. It will also help you  be more mindful of what and how much you are eating. What are tips for following this plan? Reading food labels Check the calorie count compared with the serving size. The serving size may be smaller than what you are used to eating. Check the source of the calories. Try to choose foods that are high in protein, fiber, and vitamins,  and low in saturated fat, trans fat, and sodium. Shopping Read nutrition labels while you shop. This will help you make healthy decisions about which foods to buy. Pay attention to nutrition labels for low-fat or fat-free foods. These foods sometimes have the same number of calories or more calories than the full-fat versions. They also often have added sugar, starch, or salt to make up for flavor that was removed with the fat. Make a grocery list of lower-calorie foods and stick to it. Cooking Try to cook your favorite foods in a healthier way. For example, try baking instead of frying. Use low-fat dairy products. Meal planning Use more fruits and vegetables. One-half of your plate should be fruits and vegetables. Include lean proteins, such as chicken, [place], and fish. Lifestyle Each week, aim to do one of the following: 150 minutes of moderate exercise, such as walking. 75 minutes of vigorous exercise, such as running. General information Know how many calories are in the foods you eat most often. This will help you calculate calorie counts faster. Find a way of tracking calories that works for you. Get creative. Try different apps or programs if writing down calories does not work for you. What foods should I eat?  Eat nutritious foods. It is better to have a nutritious, high-calorie food, such as an avocado, than a food with few nutrients, such as a bag of potato chips. Use your calories on foods and drinks that will fill you up and will not leave you hungry soon after eating. Examples of foods that fill you up are nuts and nut butters, vegetables, lean proteins, and high-fiber foods such as whole grains. High-fiber foods are foods with more than 5 g of fiber per serving. Pay attention to calories in drinks. Low-calorie drinks include water and unsweetened drinks. The items listed above may not be a complete list of foods and beverages you can eat. Contact a dietitian for more  information. What foods should I limit? Limit foods or drinks that are not good sources of vitamins, minerals, or protein or that are high in unhealthy fats. These include: Candy. Other sweets. Sodas, specialty coffee drinks, alcohol, and juice. The items listed above may not be a complete list of foods and beverages you should avoid. Contact a dietitian for more information. How do I count calories when eating out? Pay attention to portions. Often, portions are much larger when eating out. Try these tips to keep portions smaller: Consider sharing a meal instead of getting your own. If you get your own meal, eat only half of it. Before you start eating, ask for a container and put half of your meal into it. When available, consider ordering smaller portions from the menu instead of full portions. Pay attention to your food and drink choices. Knowing the way food is cooked and what is included with the meal can help you eat fewer calories. If calories are listed on the menu, choose the lower-calorie options. Choose dishes that include vegetables, fruits, whole grains, low-fat dairy products, and lean proteins. Choose items that are boiled, broiled, grilled, or steamed. Avoid items that are buttered, battered,  fried, or served with cream sauce. Items labeled as crispy are usually fried, unless stated otherwise. Choose water, low-fat milk, unsweetened iced tea, or other drinks without added sugar. If you want an alcoholic beverage, choose a lower-calorie option, such as a glass of wine or light beer. Ask for dressings, sauces, and syrups on the side. These are usually high in calories, so you should limit the amount you eat. If you want a salad, choose a garden salad and ask for grilled meats. Avoid extra toppings such as bacon, cheese, or fried items. Ask for the dressing on the side, or ask for olive oil and vinegar or lemon to use as dressing. Estimate how many servings of a food you are given.  Knowing serving sizes will help you be aware of how much food you are eating at restaurants. Where to find more information Centers for Disease Control and Prevention: [employer]Ex[employer]com.br [place] Department of Agriculture: [employer].dk Summary Calorie counting means keeping track of how many calories you eat and drink each day. If you eat fewer calories than your body needs, you should lose weight. A healthy amount of weight to lose per week is usually 1-2 lb (0.5-0.9 kg). This usually means reducing your daily calorie intake by 500-750 calories. The number of calories in a food can be found on a Nutrition Facts label. If a food does not have a Nutrition Facts label, try to look up the calories online or ask your dietitian for help. Use smaller plates, glasses, and bowls for smaller portions and to prevent overeating. Use your calories on foods and drinks that will fill you up and not leave you hungry shortly after a meal. This information is not intended to replace advice given to you by your health care provider. Make sure you discuss any questions you have with your health care provider. Document Revised: 09/20/2019 Document Reviewed: 09/20/2019 Elsevier Patient Education  2023 [employer].

## 2023-03-09 NOTE — Progress Notes (Signed)
Subjective:   Per patient no change in vitals since last visit, unable to obtain new vitals due to telehealth visit   Renee [name] is a 77 y.o. female who presents for Medicare Annual (Subsequent) preventive examination.  Visit Complete: Virtual  I connected with  Renee [name] on 03/09/23 by a audio enabled telemedicine application and verified that I am speaking with the correct person using two identifiers.  Patient Location: Home  Provider Location: Home Office  I discussed the limitations of evaluation and management by telemedicine. The patient expressed understanding and agreed to proceed.  Patient Medicare AWV questionnaire was completed by the patient on 03/08/23; I have confirmed that all information answered by patient is correct and no changes since this date.  Review of Systems     Cardiac Risk Factors include: advanced age (>43men, >40 women);dyslipidemia;hypertension;Other (see comment);obesity (BMI >30kg/m2), Risk factor comments: Vitamin D def     Objective:    Today's Vitals   03/09/23 1338  Weight: 222 lb (100.7 kg)  Height: 5' 6.5" (1.689 m)   Body mass index is 35.29 kg/m.     03/09/2023    1:58 PM 01/19/2022    1:58 PM 10/21/2021    8:37 AM 11/10/2020   10:48 AM 03/07/2020   11:54 AM 07/14/2019    2:18 PM 08/29/2018    8:22 AM  Advanced Directives  Does Patient Have a Medical Advance Directive? Yes Yes Yes No Yes No Yes  Type of Estate agent of [place];Living will Healthcare Power of [place];Out of facility DNR (pink MOST or yellow form);Living will   Healthcare Power of [place];Living will  Healthcare Power of [place];Living will  Does patient want to make changes to medical advance directive? No - Patient declined No - Patient declined   No - Patient declined  No - Patient declined  Copy of Healthcare Power of Attorney in Chart? Yes - validated most recent copy scanned in chart (See row information) Yes - validated most  recent copy scanned in chart (See row information)   Yes - validated most recent copy scanned in chart (See row information)  Yes - validated most recent copy scanned in chart (See row information)  Would patient like information on creating a medical advance directive?    No - Patient declined       Current Medications (verified) Outpatient Encounter Medications as of 03/09/2023  Medication Sig   atorvastatin (LIPITOR) 10 MG tablet Take 5 mg by mouth every other day.   b complex vitamins tablet Take 1 tablet by mouth once a week.   clobetasol cream (TEMOVATE) 0.05 % APPLY TO AFFECTED AREA TWICE A DAY   Coenzyme Q10 200 MG capsule Take 200 mg by mouth once a week.    famotidine (PEPCID) 20 MG tablet TAKE 1 TABLET BY MOUTH TWICE A DAY (Patient taking differently: Take 20 mg by mouth as needed for heartburn or indigestion.)   fluticasone (FLONASE) 50 MCG/ACT nasal spray Place 2 sprays into both nostrils daily.   hydrocortisone 2.5 % cream daily as needed.   metoprolol succinate (TOPROL-XL) 25 MG 24 hr tablet Take 0.5 tablets (12.5 mg total) by mouth daily. Take an extra metoprolol daily as needed.   Multiple Vitamin (MULTIVITAMIN WITH MINERALS) TABS tablet Take 1 tablet by mouth every other day.   triamcinolone cream (KENALOG) 0.1 % Apply 1 Application topically 2 (two) times daily.   Vitamin D, Ergocalciferol, (DRISDOL) 1.25 MG (50000 UNIT) CAPS capsule Take 1  capsule (50,000 Units total) by mouth every 7 (seven) days.   pravastatin (PRAVACHOL) 20 MG tablet Take 1 tablet (20 mg total) by mouth every evening. (Patient not taking: Reported on 03/09/2023)   No facility-administered encounter medications on file as of 03/09/2023.    Allergies (verified) Amoxicillin, Proair hfa [albuterol], and Penicillins   History: Past Medical History:  Diagnosis Date   Abdominal pain 07/05/2017   ABNORMAL INVOLUNTARY MOVEMENTS 01/01/2009   Qualifier: Diagnosis of  By: Alwyn Ren MD, William     Allergic  rhinitis 09/19/2008   Qualifier: Diagnosis of  By: Nena Jordan    Allergy    Arthralgia of right wrist 05/06/2020   Atypical chest pain 09/19/2008   Qualifier: Diagnosis of  By: Nena Jordan    Cervical cancer screening 05/08/2013   Menarche 11, regular Menopause 50s G2P2 s/p 2 svd No abnormal paps or MGM Fatty cyst on labia removed.    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   Qualifier: Diagnosis of  By: Nena Jordan    Dermatitis 09/17/2018   Diverticulosis 05/06/2017   Essential hypertension 09/19/2008   Qualifier: Diagnosis of  By: Nena Jordan    GERD (gastroesophageal reflux disease)    H/O osteopenia 02/08/2016   H/O tobacco use, presenting hazards to health 09/19/2008   Qualifier: Diagnosis of  By: Nena Jordan Smoking roughly 5 cig daily  Last cigarette was in December of 2016   Head tremor 12/27/2016   Hearing loss in right ear 09/19/2008   Qualifier: Diagnosis of  By: Nena Jordan    Hx of adenomatous colonic polyps 01/27/2018   Hyperglycemia 10/07/2016   Hyperlipidemia, mixed 10/03/2008   Qualifier: Diagnosis of  By: Nena Jordan    Lumbar radiculopathy 07/06/2021   Neuromuscular disorder (HCC)    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 Fixed sigmoid colon prevents. Referred for surgery.   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 02/09/2017   Tinnitus 12/12/2017   Tremor, essential    Vertigo    Vertigo    Past Surgical History:  Procedure Laterality Date   INNER EAR SURGERY  2015   with tube   MOUTH SURGERY  01/22/11   lower denture secured by titanium bolts   rectal suction biospy     XI ROBOTIC ASSISTED LOWER ANTERIOR RESECTION N/A 02/09/2017   Procedure: XI ROBOTIC RESECTION OF SIGMOID COLON, RIGID PROCTOSCOPY;  Surgeon: Karie Soda, MD;  Location: [facility];  Service: General;  Laterality: N/A;   Family History  Problem Relation Age of Onset   Stroke Mother    Hypertension Mother    Alcohol abuse Mother    Tremor Mother    Heart attack Father 10  deceased   Heart disease Paternal Grandmother        AAA rupture   Heart disease Paternal Grandfather        MI   Obesity Daughter    Colon cancer Neg Hx    Breast cancer Neg Hx    Prostate cancer Neg Hx    Diabetes Neg Hx    Liver cancer Neg Hx    Rectal cancer Neg Hx    Stomach cancer Neg Hx    Social History   Socioeconomic History   Marital status: Married    Spouse name: Molly Maduro   Number of children: 2   Years of education: Not on file   Highest education level: Associate degree: occupational, Scientist, product/process development, or vocational program  Occupational History   Occupation: retired    Comment: Photographer  Tobacco Use   Smoking status: Former    Current packs/day: 0.00    Average packs/day: 0.5 packs/day for 53.0 years (26.5 ttl pk-yrs)    Types: Cigarettes    Start date: 1964    Quit date: 09/07/2015    Years since quitting: 7.5   Smokeless tobacco: Never  Vaping Use   Vaping status: Never Used  Substance and Sexual Activity   Alcohol use: Yes    Alcohol/week: 14.0 standard drinks of alcohol    Types: 14 Glasses of wine per week    Comment: 2 glasses wine/day   Drug use: No   Sexual activity: Yes  Other Topics Concern   Not on file  Social History Narrative    Married, retired   2 children   Former smoker no drug use, does drink alcohol   Right handed   Social Determinants of Health   Financial Resource Strain: Low Risk  (03/08/2023)   Overall Financial Resource Strain (CARDIA)    Difficulty of Paying Living Expenses: Not hard at all  Food Insecurity: No Food Insecurity (03/08/2023)   Hunger Vital Sign    Worried About Running Out of Food in the Last Year: Never true    Ran Out of Food in the Last Year: Never true  Transportation Needs: No Transportation Needs (03/08/2023)   PRAPARE - Administrator, Civil Service (Medical): No    Lack of Transportation (Non-Medical): No  Physical Activity: Sufficiently Active (03/08/2023)   Exercise Vital Sign    Days of Exercise per Week: 3 days    Minutes of Exercise per Session: 60 min  Stress: No Stress Concern Present (03/08/2023)   [employer] of Occupational Health - Occupational Stress Questionnaire    Feeling of Stress : Only a little  Recent Concern: Stress - Stress Concern Present (02/01/2023)   [employer] of Occupational Health - Occupational Stress Questionnaire    Feeling of Stress : To some extent  Social Connections: Socially Integrated (03/08/2023)   Social Connection and Isolation Panel [NHANES]    Frequency of Communication with Friends and Family: More than three times a week    Frequency of Social Gatherings with Friends and Family: Three times a week    Attends Religious Services: More than 4 times per year    Active Member of Clubs or Organizations: Yes    Attends Banker Meetings: 1 to 4 times per year    Marital Status: Married    Tobacco Counseling Counseling given: Not Answered   Clinical Intake:  Pre-visit preparation completed: Yes  Pain : No/denies pain     BMI - recorded: 35.29 Nutritional Status: BMI >  30  Obese Nutritional Risks: None Diabetes: No  How often do you need to have someone help you when you read instructions,  pamphlets, or other written materials from your doctor or pharmacy?: 1 - Never  Interpreter Needed?: No  Information entered by :: Tora Kindred, CMA   Activities of Daily Living    03/08/2023   12:09 PM  In your present state of health, do you have any difficulty performing the following activities:  Hearing? 1  Comment has appt with audiology on 04/09/23, deaf in right ear  Vision? 0  Difficulty concentrating or making decisions? 0  Walking or climbing stairs? 0  Dressing or bathing? 0  Doing errands, shopping? 0  Preparing Food and eating ? N  Using the Toilet? N  In the past six months, have you accidently leaked urine? N  Do you have problems with loss of bowel control? N  Managing your Medications? N  Managing your Finances? N  Housekeeping or managing your Housekeeping? N    Patient Care Team: [name], MD as PCP - General (Family Medicine) Suzanna Obey, MD as Consulting Physician (Otolaryngology) Iva Boop, MD as Consulting Physician (Gastroenterology) Dr Onalee Hua Daughtry (Optometry) Karie Soda, MD as Consulting Physician (General Surgery) Love, Genene Churn, MD as Consulting Physician (Neurology)  Indicate any recent Medical Services you may have received from other than [facility] providers in the past year (date may be approximate).     Assessment:   This is a routine wellness examination for Sm[place].  Hearing/Vision screen Hearing Screening - Comments:: Patient deaf in right ear, sees audiologist 03/30/23  Dietary issues and exercise activities discussed:     Goals Addressed             This Visit's Progress    COMPLETED: Increase water intake       COMPLETED: Reduce alcohol intake        COMPLETED: Reduce sodium intake       DASH Eating Plan DASH stands for "Dietary Approaches to Stop Hypertension." The DASH eating plan is a healthy eating plan that has been shown to reduce high blood pressure (hypertension). Additional health benefits may include  reducing the risk of type 2 diabetes mellitus, heart disease, and stroke. The DASH eating plan may also help with weight loss. What do I need to know about the DASH eating plan? For the DASH eating plan, you will follow these general guidelines: Choose foods with less than 150 milligrams of sodium per serving (as listed on the food label). Use salt-free seasonings or herbs instead of table salt or sea salt. Check with your health care provider or pharmacist before using salt substitutes. Eat lower-sodium products. These are often labeled as "low-sodium" or "no salt added." Eat fresh foods. Avoid eating a lot of canned foods. Eat more vegetables, fruits, and low-fat dairy products. Choose whole grains. Look for the word "whole" as the first word in the ingredient list. Choose fish and skinless chicken or [place] more often than red meat. Limit fish, poultry, and meat to 6 oz (170 g) each day. Limit sweets, desserts, sugars, and sugary drinks. Choose heart-healthy fats. Eat more home-cooked food and less restaurant, buffet, and fast food. Limit fried foods. Do not fry foods. Cook foods using methods such as baking, boiling, grilling, and broiling instead. When eating at a restaurant, ask that your food be prepared with less salt, or no salt if possible. What foods can I eat? Seek help from a dietitian for  individual calorie needs. Grains  Whole grain or whole wheat bread. Brown rice. Whole grain or whole wheat pasta. Quinoa, bulgur, and whole grain cereals. Low-sodium cereals. Corn or whole wheat flour tortillas. Whole grain cornbread. Whole grain crackers. Low-sodium crackers. Vegetables  Fresh or frozen vegetables (raw, steamed, roasted, or grilled). Low-sodium or reduced-sodium tomato and vegetable juices. Low-sodium or reduced-sodium tomato sauce and paste. Low-sodium or reduced-sodium canned vegetables. Fruits  All fresh, canned (in natural juice), or frozen fruits. Meat and Other Protein  Products  Ground beef (85% or leaner), grass-fed beef, or beef trimmed of fat. Skinless chicken or [place]. Ground chicken or [place]. Pork trimmed of fat. All fish and seafood. Eggs. Dried beans, peas, or lentils. Unsalted nuts and seeds. Unsalted canned beans. Dairy  Low-fat dairy products, such as skim or 1% milk, 2% or reduced-fat cheeses, low-fat ricotta or cottage cheese, or plain low-fat yogurt. Low-sodium or reduced-sodium cheeses. Fats and Oils  Tub margarines without trans fats. Light or reduced-fat mayonnaise and salad dressings (reduced sodium). Avocado. Safflower, olive, or canola oils. Natural peanut or almond butter. Other  Unsalted popcorn and pretzels. The items listed above may not be a complete list of recommended foods or beverages. Contact your dietitian for more options.  What foods are not recommended? Grains  White bread. White pasta. White rice. Refined cornbread. Bagels and croissants. Crackers that contain trans fat. Vegetables  Creamed or fried vegetables. Vegetables in a cheese sauce. Regular canned vegetables. Regular canned tomato sauce and paste. Regular tomato and vegetable juices. Fruits  Canned fruit in light or heavy syrup. Fruit juice. Meat and Other Protein Products  Fatty cuts of meat. Ribs, chicken wings, bacon, sausage, bologna, salami, chitterlings, fatback, hot dogs, bratwurst, and packaged luncheon meats. Salted nuts and seeds. Canned beans with salt. Dairy  Whole or 2% milk, cream, half-and-half, and cream cheese. Whole-fat or sweetened yogurt. Full-fat cheeses or blue cheese. Nondairy creamers and whipped toppings. Processed cheese, cheese spreads, or cheese curds. Condiments  Onion and garlic salt, seasoned salt, table salt, and sea salt. Canned and packaged gravies. Worcestershire sauce. Tartar sauce. Barbecue sauce. Teriyaki sauce. Soy sauce, including reduced sodium. Steak sauce. Fish sauce. Oyster sauce. Cocktail sauce. Horseradish. Ketchup and  mustard. Meat flavorings and tenderizers. Bouillon cubes. Hot sauce. Tabasco sauce. Marinades. Taco seasonings. Relishes. Fats and Oils  Butter, stick margarine, lard, shortening, ghee, and bacon fat. Coconut, palm kernel, or palm oils. Regular salad dressings. Other  Pickles and olives. Salted popcorn and pretzels. The items listed above may not be a complete list of foods and beverages to avoid. Contact your dietitian for more information.  Where can I find more information? National Heart, Lung, and Blood Institute: [employer] This information is not intended to replace advice given to you by your health care provider. Make sure you discuss any questions you have with your health care provider. Document Released: 07/29/2011 Document Revised: 01/15/2016 Document Reviewed: 06/13/2013 Elsevier Interactive Patient Education  2017 Elsevier Inc.      Weight (lb) < 200 lb (90.7 kg)   222 lb (100.7 kg)     Depression Screen    03/09/2023    1:54 PM 02/01/2023    9:37 AM 07/29/2022    2:31 PM 01/19/2022    1:59 PM 01/14/2022    9:43 AM 05/14/2021    9:27 AM 10/23/2020    9:11 AM  PHQ 2/9 Scores  PHQ - 2 Score 0 0 0 0 0 0 0  PHQ- 9 Score 0  0         Fall Risk    03/08/2023   12:09 PM 02/01/2023    9:37 AM 07/29/2022    2:30 PM 01/19/2022    1:59 PM 10/21/2021    8:37 AM  Fall Risk   Falls in the past year? 0 0 0 0 0  Number falls in past yr: 0 0 0 0 0  Injury with Fall? 0 0 0 0 0  Risk for fall due to : No Fall Risks   No Fall Risks   Follow up Falls prevention discussed Falls evaluation completed Falls evaluation completed Falls evaluation completed     MEDICARE RISK AT HOME:   TIMED UP AND GO:  Was the test performed?  No    Cognitive Function:    10/07/2016   11:02 AM 10/03/2015    2:50 PM  MMSE - Mini Mental State Exam  Orientation to time 5 5  Orientation to Place 5 5  Registration 3 3  Attention/ Calculation 5 5  Recall 3 3   Language- name 2 objects 2 2  Language- repeat 1 1  Language- follow 3 step command 3 3  Language- read & follow direction 1 1  Write a sentence 1 1  Copy design 1 1  Total score 30 30        03/09/2023    1:59 PM 01/19/2022    2:07 PM  6CIT Screen  What Year? 0 points 0 points  What month? 0 points 0 points  What time? 0 points 0 points  Count back from 20 0 points 0 points  Months in reverse 0 points 0 points  Repeat phrase 0 points 0 points  Total Score 0 points 0 points    Immunizations Immunization History  Administered Date(s) Administered   PFIZER(Purple Top)SARS-COV-2 Vaccination 10/01/2019, 10/24/2019, 06/26/2020   Pneumococcal Conjugate-13 07/06/2016   Pneumococcal Polysaccharide-23 04/24/2020    TDAP status: Due, Education has been provided regarding the importance of this vaccine. Advised may receive this vaccine at local pharmacy or Health Dept. Aware to provide a copy of the vaccination record if obtained from local pharmacy or Health Dept. Verbalized acceptance and understanding.  Flu Vaccine status: Declined, Education has been provided regarding the importance of this vaccine but patient still declined. Advised may receive this vaccine at local pharmacy or Health Dept. Aware to provide a copy of the vaccination record if obtained from local pharmacy or Health Dept. Verbalized acceptance and understanding.  Pneumococcal vaccine status: Up to date  Covid-19 vaccine status: Completed vaccines  Qualifies for Shingles Vaccine? Yes   Zostavax completed No   Shingrix Completed?: No.    Education has been provided regarding the importance of this vaccine. Patient has been advised to call insurance company to determine out of pocket expense if they have not yet received this vaccine. Advised may also receive vaccine at local pharmacy or Health Dept. Verbalized acceptance and understanding.  Screening Tests Health Maintenance  Topic Date Due   Hepatitis C  Screening  Never done   DTaP/Tdap/Td (1 - Tdap) Never done   Zoster Vaccines- Shingrix (1 of 2) Never done   Lung Cancer Screening  Never done   Colonoscopy  01/18/2023   INFLUENZA VACCINE  03/24/2023   DEXA SCAN  08/12/2023   MAMMOGRAM  09/29/2023   Medicare Annual Wellness (AWV)  03/08/2024   Pneumonia Vaccine 53+ Years old  Completed   HPV VACCINES  Aged Out   COVID-19 Vaccine  Discontinued    Health Maintenance  Health Maintenance Due  Topic Date Due   Hepatitis C Screening  Never done   DTaP/Tdap/Td (1 - Tdap) Never done   Zoster Vaccines- Shingrix (1 of 2) Never done   Lung Cancer Screening  Never done   Colonoscopy  01/18/2023    Colorectal cancer screening: Type of screening: Colonoscopy. Completed 01/17/18. Repeat every 5 years  Mammogram status: Completed 09/28/22. Repeat every year  Bone Density status: Completed 08/11/21. Results reflect: Bone density results: OSTEOPENIA. Repeat every 5 years.  Lung Cancer Screening: (Low Dose CT Chest recommended if Age 78-80 years, 20 pack-year currently smoking OR have quit w/in 15years.) does qualify.   Lung Cancer Screening Referral: Patient wants to think about it and discuss with Dr. Abner Greenspan  Additional Screening:  Hepatitis C Screening: does qualify; Completed Will have with next labs  Vision Screening: Recommended annual ophthalmology exams for early detection of glaucoma and other disorders of the eye. Is the patient up to date with their annual eye exam?  Yes  Who is the provider or what is the name of the office in which the patient attends annual eye exams? Dr. Onalee Hua Daughtry If pt is not established with a provider, would they like to be referred to a provider to establish care? No .   Dental Screening: Recommended annual dental exams for proper oral hygiene  Diabetic Foot Exam: n/a  Community Resource Referral / Chronic Care Management: CRR required this visit?  No   CCM required this visit?  No     Plan:      I have personally reviewed and noted the following in the patient's chart:   Medical and social history Use of alcohol, tobacco or illicit drugs  Current medications and supplements including opioid prescriptions. Patient is not currently taking opioid prescriptions. Functional ability and status Nutritional status Physical activity Advanced directives List of other physicians Hospitalizations, surgeries, and ER visits in previous 12 months Vitals Screenings to include cognitive, depression, and falls Referrals and appointments  In addition, I have reviewed and discussed with patient certain preventive protocols, quality metrics, and best practice recommendations. A written personalized care plan for preventive services as well as general preventive health recommendations were provided to patient.     Tora Kindred, CMA   03/09/2023   After Visit Summary: (MyChart) Due to this being a telephonic visit, the after visit summary with patients personalized plan was offered to patient via MyChart   Nurse Notes:  Patient is due for colonoscopy 2024, but also has aged out. Patient states she will call GI to discuss. Patient declined low dose CT scan for lung cancer screening, Shingrix and Tdap. She states that she would like to think about these things and discuss with PCP at [facility] 04/2023. Patient will schedule eye exam.

## 2023-03-30 DIAGNOSIS — H906 Mixed conductive and sensorineural hearing loss, bilateral: Secondary | ICD-10-CM | POA: Diagnosis not present

## 2023-04-07 ENCOUNTER — Encounter (INDEPENDENT_AMBULATORY_CARE_PROVIDER_SITE_OTHER): Payer: Self-pay

## 2023-04-08 ENCOUNTER — Encounter: Payer: Self-pay | Admitting: Cardiology

## 2023-04-08 ENCOUNTER — Ambulatory Visit: Payer: Medicare Other | Admitting: Cardiology

## 2023-04-08 VITALS — BP 164/76 | HR 79 | Ht 66.5 in | Wt 221.0 lb

## 2023-04-08 DIAGNOSIS — I10 Essential (primary) hypertension: Secondary | ICD-10-CM | POA: Diagnosis not present

## 2023-04-08 DIAGNOSIS — R002 Palpitations: Secondary | ICD-10-CM

## 2023-04-08 DIAGNOSIS — I493 Ventricular premature depolarization: Secondary | ICD-10-CM | POA: Diagnosis not present

## 2023-04-08 DIAGNOSIS — Z8249 Family history of ischemic heart disease and other diseases of the circulatory system: Secondary | ICD-10-CM

## 2023-04-08 MED ORDER — ATORVASTATIN CALCIUM 10 MG PO TABS: 5.0000 mg | ORAL_TABLET | ORAL | 3 refills | Status: DC

## 2023-04-08 NOTE — Patient Instructions (Signed)
Medication Instructions:  Your physician recommends that you continue on your current medications as directed. Please refer to the Current Medication list given to you today.  *If you need a refill on your cardiac medications before your next appointment, please call your pharmacy*   Lab Work: None Ordered If you have labs (blood work) drawn today and your tests are completely normal, you will receive your results only by: [place] (if you have MyChart) OR A paper copy in the mail If you have any lab test that is abnormal or we need to change your treatment, we will call you to review the results.   Testing/Procedures: We will order CT coronary calcium score. It will cost $99.00 and is not covered by insurance.  Please call to schedule.    [facility] [address] [place], [place] [address] [phone]     Follow-Up: At [facility], you and your health needs are our priority.  As part of our continuing mission to provide you with exceptional heart care, we have created designated Provider Care Teams.  These Care Teams include your primary Cardiologist (physician) and Advanced Practice Providers (APPs -  Physician Assistants and Nurse Practitioners) who all work together to provide you with the care you need, when you need it.  We recommend signing up for the patient portal called "MyChart".  Sign up information is provided on this After Visit Summary.  MyChart is used to connect with patients for Virtual Visits (Telemedicine).  Patients are able to view lab/test results, encounter notes, upcoming appointments, etc.  Non-urgent messages can be sent to your provider as well.   To learn more about what you can do with MyChart, go to [email or web address].    Your next appointment:   6 month(s)  The format for your next appointment:   In Person  Provider:   Jenne Campus, MD    Other Instructions NA

## 2023-04-08 NOTE — Addendum Note (Signed)
Addended by: Baldo Ash D on: 04/08/2023 04:39 PM   Modules accepted: Orders

## 2023-04-08 NOTE — Progress Notes (Signed)
Cardiology Office Note:    Date:  04/08/2023   ID:  Renee [name], DOB Apr 06, 1946, MRN [id number]  PCP:  [name], MD  Cardiologist:  Gypsy Balsam, MD    Referring MD: [name], MD   Chief Complaint  Patient presents with   Excessive Sweating   Dizziness    History of Present Illness:    Renee [name] is a 77 y.o. female with past medical history significant for dyslipidemia, atypical chest pain, palpitations, PVCs.  She comes today to my office for follow-up.  Overall she seems to be doing well she complain however of having explained sweat out of blue she will be sweating profoundly.  There is no chest pain it is not associated with exercise.  She still does some exercises on the regular basis she is practicing tai chi and she does not on the regular basis with no difficulties.  Described to have some heartburn that happen typically at night when she lays down in the bed but no chest pain while walking exercising.  She quit smoking few years ago.  Does not have any difficulty staying away from smoking  Past Medical History:  Diagnosis Date   Abdominal pain 07/05/2017   ABNORMAL INVOLUNTARY MOVEMENTS 01/01/2009   Qualifier: Diagnosis of  By: Alwyn Ren MD, William     Allergic rhinitis 09/19/2008   Qualifier: Diagnosis of  By: Nena Jordan    Allergy    Arthralgia of right wrist 05/06/2020   Atypical chest pain 09/19/2008   Qualifier: Diagnosis of  By: Nena Jordan    Cervical cancer screening 05/08/2013   Menarche 11, regular Menopause 50s G2P2 s/p 2 svd No abnormal paps or [employer]GM Fatty cyst on labia removed.    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   Qualifier: Diagnosis of  By: Nena Jordan    Dermatitis 09/17/2018   Diverticulosis 05/06/2017   Essential hypertension 09/19/2008   Qualifier: Diagnosis of   By: Nena Jordan    GERD (gastroesophageal reflux disease)    H/O osteopenia 02/08/2016   H/O tobacco use, presenting hazards to health 09/19/2008   Qualifier: Diagnosis of  By: Nena Jordan Smoking roughly 5 cig daily  Last cigarette was in December of 2016   Head tremor 12/27/2016   Hearing loss in right ear 09/19/2008   Qualifier: Diagnosis of  By: Nena Jordan    Hx of adenomatous colonic polyps 01/27/2018   Hyperglycemia 10/07/2016   Hyperlipidemia, mixed 10/03/2008   Qualifier: Diagnosis of  By: Nena Jordan    Lumbar radiculopathy 07/06/2021   Neuromuscular disorder (HCC)    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 Fixed sigmoid colon prevents. Referred for surgery.   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 02/09/2017   Tinnitus 12/12/2017   Tremor, essential    Vertigo    Vertigo     Past Surgical History:  Procedure Laterality Date   INNER EAR SURGERY  2015   with tube   MOUTH SURGERY  01/22/11   lower denture secured by titanium bolts   rectal suction biospy     XI ROBOTIC ASSISTED LOWER ANTERIOR RESECTION N/A 02/09/2017   Procedure: XI ROBOTIC RESECTION OF SIGMOID COLON, RIGID PROCTOSCOPY;  Surgeon: Karie Soda, MD;  Location: [facility];  Service: General;  Laterality: N/A;    Current Medications: Current Meds  Medication Sig   atorvastatin (LIPITOR) 10 MG tablet Take 5 mg by mouth every other day.   b complex vitamins  tablet Take 1 tablet by mouth once a week.   clobetasol cream (TEMOVATE) 0.05 % Apply 1 Application topically 2 (two) times daily as needed (Rash).   Coenzyme Q10 200 MG capsule Take 200 mg by mouth once a week.    famotidine (PEPCID) 20 MG tablet TAKE 1 TABLET BY MOUTH TWICE A DAY (Patient taking differently: Take 20 mg by mouth as needed for heartburn or indigestion.)   fluticasone (FLONASE) 50 MCG/ACT nasal spray Place 2 sprays into both nostrils daily. (Patient taking differently: Place 2 sprays into both nostrils daily as needed for allergies or rhinitis.)   hydrocortisone 2.5 % cream Apply 1 Application topically 2 (two) times daily.   metoprolol succinate (TOPROL-XL) 25 MG 24 hr tablet Take 0.5 tablets (12.5 mg total) by mouth daily. Take an extra metoprolol daily as needed.   Multiple Vitamin (MULTIVITAMIN WITH MINERALS) TABS tablet Take 1 tablet by mouth every other day.   triamcinolone cream (KENALOG) 0.1 % Apply 1 Application topically 2 (two) times daily.   Vitamin D, Ergocalciferol, (DRISDOL) 1.25 MG (50000 UNIT) CAPS capsule Take 1 capsule (50,000 Units total) by mouth every 7 (seven) days.   [DISCONTINUED] pravastatin (PRAVACHOL) 20 MG tablet Take 1 tablet (20 mg total) by mouth every evening.     Allergies:   Amoxicillin, Proair hfa [albuterol], and Penicillins   Social History   Socioeconomic History   Marital status: Married    Spouse name: Molly Maduro   Number of children: 2   Years of education: Not on file   Highest education level: Associate degree: occupational, Scientist, product/process development, or vocational program  Occupational History   Occupation: retired    Comment: Photographer  Tobacco Use   Smoking status: Former    Current packs/day: 0.00    Average packs/day: 0.5 packs/day for 53.0 years (26.5 ttl pk-yrs)    Types: Cigarettes    Start date: 1964    Quit date: 09/07/2015    Years since quitting: 7.5   Smokeless tobacco: Never  Vaping Use   Vaping status: Never Used  Substance and  Sexual Activity   Alcohol use: Yes    Alcohol/week: 14.0 standard drinks of alcohol    Types: 14 Glasses of wine per week    Comment: 2 glasses wine/day   Drug use: No   Sexual activity: Yes  Other Topics Concern   Not on file  Social History Narrative   Married, retired   2 children   Former smoker no drug use, does drink alcohol   Right handed   Social Determinants of Health   Financial Resource Strain: Low Risk  (03/08/2023)   Overall Financial Resource Strain (CARDIA)    Difficulty of Paying Living Expenses: Not hard  at all  Food Insecurity: No Food Insecurity (03/08/2023)   Hunger Vital Sign    Worried About Running Out of Food in the Last Year: Never true    Ran Out of Food in the Last Year: Never true  Transportation Needs: No Transportation Needs (03/08/2023)   PRAPARE - Administrator, Civil Service (Medical): No    Lack of Transportation (Non-Medical): No  Physical Activity: Sufficiently Active (03/08/2023)   Exercise Vital Sign    Days of Exercise per Week: 3 days    Minutes of Exercise per Session: 60 min  Stress: No Stress Concern Present (03/08/2023)   [employer] of Occupational Health - Occupational Stress Questionnaire    Feeling of Stress : Only a little  Recent Concern: Stress - Stress Concern Present (02/01/2023)   [employer] of Occupational Health - Occupational Stress Questionnaire    Feeling of Stress : To some extent  Social Connections: Socially Integrated (03/08/2023)   Social Connection and Isolation Panel [NHANES]    Frequency of Communication with Friends and Family: More than three times a week    Frequency of Social Gatherings with Friends and Family: Three times a week    Attends Religious Services: More than 4 times per year    Active Member of Clubs or Organizations: Yes    Attends Banker Meetings: 1 to 4 times per year    Marital Status: Married     Family History: The patient's family history includes  Alcohol abuse in her mother; Heart attack (age of onset: 61) in her father; Heart disease in her paternal grandfather and paternal grandmother; Hypertension in her mother; Obesity in her daughter; Stroke in her mother; Tremor in her mother. There is no history of Colon cancer, Breast cancer, Prostate cancer, Diabetes, Liver cancer, Rectal cancer, or Stomach cancer. ROS:   Please see the history of present illness.    All 14 point review of systems negative except as described per history of present illness  EKGs/Labs/Other Studies Reviewed:    EKG Interpretation Date/Time:  Friday April 08 2023 15:58:53 EDT Ventricular Rate:  79 PR Interval:  164 QRS Duration:  84 QT Interval:  392 QTC Calculation: 449 R Axis:   27  Text Interpretation: Sinus rhythm with occasional Premature ventricular complexes Nonspecific ST and T wave abnormality When compared with ECG of 17-May-2017 14:44, Premature ventricular complexes are now Present Nonspecific T wave abnormality now evident in Lateral leads Confirmed by Gypsy Balsam [phone]) on 04/08/2023 4:06:08 PM    Recent Labs: 02/10/2023: ALT 22; BUN 15; Creatinine, Ser 0.71; Hemoglobin 14.8; Magnesium 2.2; Platelets 238.0; Potassium 4.3; Sodium 137; TSH 1.50  Recent Lipid Panel    Component Value Date/Time   CHOL 243 (H) 02/10/2023 0910   TRIG 140.0 02/10/2023 0910   HDL 70.00 02/10/2023 0910   CHOLHDL 3 02/10/2023 0910   VLDL 28.0 02/10/2023 0910   LDLCALC 145 (H) 02/10/2023 0910   LDLCALC 139 (H) 04/24/2020 0912   LDLDIRECT 148.6 09/26/2008 0811    Physical Exam:    VS:  BP (!) 164/76 (BP Location: Left Arm, Patient Position: Sitting)   Pulse 79   Ht 5' 6.5" (1.689 m)   Wt 221 lb (100.2 kg)   SpO2 94%   BMI 35.14 kg/m     Wt Readings from Last 3 Encounters:  04/08/23 221 lb (100.2 kg)  03/09/23 222 lb (100.7 kg)  02/01/23 222 lb 9.6 oz (101 kg)     GEN:  Well nourished, well developed in no acute distress HEENT: Normal NECK: No  JVD; No carotid bruits LYMPHATICS: No lymphadenopathy CARDIAC: RRR, no murmurs, no rubs, no gallops RESPIRATORY:  Clear to auscultation without rales, wheezing or rhonchi  ABDOMEN: Soft, non-tender, non-distended MUSCULOSKELETAL:  No edema; No deformity  SKIN: Warm and dry LOWER EXTREMITIES: no swelling NEUROLOGIC:  Alert and oriented x 3 PSYCHIATRIC:  Normal affect   ASSESSMENT:    1. Palpitations   2. Essential hypertension   3. PVC's (premature ventricular contractions)    PLAN:    In order of problems listed above:  Palpitations denies having any doing well from that point review. Dyslipidemia her last LDL was 145, HDL 70.  She is able to tolerate 5 mg of Lipitor I will refill prescription for her.  I suggested to do calcium scoring based on that decide what her target LDL should be.  She agree with my suggestion. Essential hypertension elevated in the office like usually.  But at home always good we will continue present medication relations. I explained swelling I have no good explanation for this.  I will do calcium score and based on that we decide if we need to do any more aggressive assessment of her coronary artery disease.   Medication Adjustments/Labs and Tests Ordered: Current medicines are reviewed at length with the patient today.  Concerns regarding medicines are outlined above.  Orders Placed This Encounter  Procedures   EKG 12-Lead   Medication changes: No orders of the defined types were placed in this encounter.   Signed, Georgeanna Lea, MD, [facility] 04/08/2023 4:23 PM    [facility]

## 2023-05-10 ENCOUNTER — Ambulatory Visit (INDEPENDENT_AMBULATORY_CARE_PROVIDER_SITE_OTHER): Payer: Medicare Other | Admitting: Family Medicine

## 2023-05-10 VITALS — BP 138/80 | HR 73 | Temp 98.0°F | Resp 16 | Ht 66.0 in | Wt 222.3 lb

## 2023-05-10 DIAGNOSIS — E559 Vitamin D deficiency, unspecified: Secondary | ICD-10-CM

## 2023-05-10 DIAGNOSIS — I10 Essential (primary) hypertension: Secondary | ICD-10-CM

## 2023-05-10 DIAGNOSIS — E785 Hyperlipidemia, unspecified: Secondary | ICD-10-CM

## 2023-05-10 DIAGNOSIS — R42 Dizziness and giddiness: Secondary | ICD-10-CM | POA: Diagnosis not present

## 2023-05-10 DIAGNOSIS — R2681 Unsteadiness on feet: Secondary | ICD-10-CM

## 2023-05-10 DIAGNOSIS — R739 Hyperglycemia, unspecified: Secondary | ICD-10-CM | POA: Diagnosis not present

## 2023-05-10 DIAGNOSIS — G243 Spasmodic torticollis: Secondary | ICD-10-CM | POA: Diagnosis not present

## 2023-05-10 DIAGNOSIS — E782 Mixed hyperlipidemia: Secondary | ICD-10-CM

## 2023-05-10 DIAGNOSIS — K219 Gastro-esophageal reflux disease without esophagitis: Secondary | ICD-10-CM | POA: Diagnosis not present

## 2023-05-10 NOTE — Assessment & Plan Note (Signed)
Avoid offending foods, start probiotics. Do not eat large meals in late evening and consider raising head of bed.

## 2023-05-10 NOTE — Assessment & Plan Note (Signed)
Well controlled, no changes to meds. Encouraged heart healthy diet such as the DASH diet and exercise as tolerated.  °

## 2023-05-10 NOTE — Assessment & Plan Note (Signed)
hgba1c acceptable, minimize simple carbs. Increase exercise as tolerated.  

## 2023-05-10 NOTE — Assessment & Plan Note (Signed)
Encourage heart healthy diet such as MIND or DASH diet, increase exercise, avoid trans fats, simple carbohydrates and processed foods, consider a krill or fish or flaxseed oil cap daily.  °

## 2023-05-10 NOTE — Assessment & Plan Note (Signed)
Supplement and monitor 

## 2023-05-10 NOTE — Assessment & Plan Note (Signed)
Dizziness has been more pronounced the past month. She is not sure the caused

## 2023-05-11 DIAGNOSIS — R2681 Unsteadiness on feet: Secondary | ICD-10-CM

## 2023-05-11 DIAGNOSIS — G243 Spasmodic torticollis: Secondary | ICD-10-CM

## 2023-05-11 HISTORY — DX: Spasmodic torticollis: G24.3

## 2023-05-11 HISTORY — DX: Unsteadiness on feet: R26.81

## 2023-05-11 NOTE — Assessment & Plan Note (Signed)
Encourage heart healthy diet such as MIND or DASH diet, increase exercise, avoid trans fats, simple carbohydrates and processed foods, consider a krill or fish or flaxseed oil cap daily.  °

## 2023-05-11 NOTE — Assessment & Plan Note (Signed)
Her sense of disequilibrium is worsening with neck pain and agrees to return to Neurology for consideration of Botox injections.
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
## 2023-05-11 NOTE — Assessment & Plan Note (Signed)
Referred to physical therapy

## 2023-05-13 ENCOUNTER — Other Ambulatory Visit: Payer: Self-pay | Admitting: Cardiology

## 2023-06-28 NOTE — Therapy (Unsigned)
OUTPATIENT PHYSICAL THERAPY VESTIBULAR EVALUATION     Patient Name: Renee [name] MRN: [id number] DOB:03-10-1946, 77 y.o., female Today's Date: 06/29/2023  END OF SESSION:  PT End of Session - 06/29/23 1434     Visit Number 1    Date for PT Re-Evaluation 09/07/23    PT Start Time 0925    PT Stop Time 1010    PT Time Calculation (min) 45 min    Activity Tolerance Patient tolerated treatment well    Behavior During Therapy [facility] for tasks assessed/performed             Past Medical History:  Diagnosis Date   Abdominal pain 07/05/2017   ABNORMAL INVOLUNTARY MOVEMENTS 01/01/2009   Qualifier: Diagnosis of  By: Alwyn Ren MD, William     Allergic rhinitis 09/19/2008   Qualifier: Diagnosis of  By: Nena Jordan    Allergy    Arthralgia of right wrist 05/06/2020   Atypical chest pain 09/19/2008   Qualifier: Diagnosis of  By: Nena Jordan    Cervical cancer screening 05/08/2013   Menarche 11, regular Menopause 75s G2P2 s/p 2 svd No abnormal paps or MGM Fatty cyst on labia removed.    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   Qualifier: Diagnosis of  By: Nena Jordan    Dermatitis 09/17/2018   Diverticulosis 05/06/2017   Essential hypertension 09/19/2008   Qualifier: Diagnosis of  By: Nena Jordan    GERD (gastroesophageal reflux disease)    H/O osteopenia 02/08/2016   H/O tobacco use, presenting hazards to health 09/19/2008   Qualifier: Diagnosis of  By: Nena Jordan Smoking roughly 5 cig daily  Last cigarette was in December of 2016   Head tremor 12/27/2016   Hearing loss in right ear 09/19/2008   Qualifier: Diagnosis of  By: Nena Jordan    Hx of adenomatous colonic polyps 01/27/2018   Hyperglycemia 10/07/2016   Hyperlipidemia, mixed 10/03/2008   Qualifier: Diagnosis of  By: Nena Jordan    Lumbar  radiculopathy 07/06/2021   Neuromuscular disorder (HCC)    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 Fixed sigmoid colon prevents. Referred for surgery.   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 02/09/2017   Tinnitus 12/12/2017   Tremor, essential    Vertigo    Vertigo    Past Surgical History:  Procedure Laterality Date   INNER EAR SURGERY  2015   with tube   MOUTH SURGERY  01/22/11   lower denture secured by titanium bolts   rectal suction biospy     XI ROBOTIC ASSISTED LOWER ANTERIOR RESECTION N/A 02/09/2017   Procedure: XI ROBOTIC RESECTION OF SIGMOID COLON, RIGID PROCTOSCOPY;  Surgeon: Karie Soda, MD;  Location:  [facility];  Service: General;  Laterality: N/A;   Patient Active Problem List   Diagnosis Date Noted   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   Unsteady gait 05/11/2023   Myalgia 02/02/2023   Vitamin D deficiency 07/29/2022   Lumbar radiculopathy 07/06/2021   Osteopenia 05/14/2021   Change in bowel habits 05/14/2021   Plantar wart of both feet 10/26/2020   Dizziness    Tremor, essential    Neuromuscular disorder (HCC)    Allergy    Arthralgia of right wrist 05/06/2020   Strain of gluteus medius of right lower extremity 05/06/2020   Right hip pain 04/28/2020   Right chronic serous otitis media 05/21/2019   Dermatitis 09/17/2018   Shoulder pain 06/29/2018   Hx of adenomatous colonic polyps 01/27/2018   Tinnitus 12/12/2017   Rib pain on left side  11/17/2017   Abdominal pain 07/05/2017   PVC's (premature ventricular contractions) 05/10/2017   Diverticulosis 05/06/2017   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 02/09/2017   Head tremor 12/27/2016   Polyp of sigmoid colon 12/08/2016   Hyperglycemia 10/07/2016   Obesity 07/06/2016   GERD (gastroesophageal reflux disease) 07/06/2016   H/O osteopenia 02/08/2016   Cervical pain (neck) 10/07/2015   Preventative health care 05/12/2013   Cervical cancer screening 05/08/2013   Abnormal involuntary movement 01/01/2009   Hyperlipidemia, mixed 10/03/2008   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 She has always had some dizziness, but it has gotten worse recently, continuous. She recently obtained hearing aides and saw a neurologist who diagnosed her with cervical dystonia. She saw an ophthalmologist yesterday and her vision is normal. Pt accompanied by: self  PERTINENT HISTORY:  Per referring Dr note: Chronic Dizziness Persistent, non-vertiginous dizziness, worse with ambulation. No clear pattern or associated symptoms. No recent head trauma. Possible multifactorial etiology including cervical dystonia and hearing loss. -Order MRI to rule out central causes. -Refer back to neurology for further evaluation. -Consider physical therapy for unsteady gait and dizziness.   Cervical Dystonia Chronic  condition contributing to dizziness and muscle tension. Patient reluctant to try Botox therapy. -Discussed potential benefits of Botox therapy for symptom relief and prevention of further spinal damage. Patient to consider.   Hearing Loss Patient in process of getting hearing aids. Potential contributor to dizziness. -Encourage continuation of audiology follow-up and hearing aid trial.  PAIN:  Are you having pain? Yes: NPRS scale: 6/10 Pain location: R neck Pain description: stiffness Aggravating factors: Nothing identified Relieving factors: Tylenol  PRECAUTIONS: Fall  RED FLAGS: None   WEIGHT BEARING RESTRICTIONS: No  FALLS: Has patient fallen in last 6 months? No  LIVING ENVIRONMENT: Lives with: lives with their family and lives with their spouse Lives in: House/apartment Stairs: Yes: Internal: 14 steps; on right going up Has following equipment at home: Single point cane  PLOF: Independent, likes to walk, but limited due to fear of falling, Tai-Chi 3 x a week.  PATIENT  GOALS: Patient would like to identify the cause of the dizziness and treat it.  OBJECTIVE:  Note: Objective measures were completed at Evaluation unless otherwise noted.  DIAGNOSTIC FINDINGS: N/A  COGNITION: Overall cognitive status: Within functional limits for tasks assessed   SENSATION: Denies changes  EDEMA:  Denies edema  MUSCLE TONE:  WNL  POSTURE:  rounded shoulders and cervical tremor with neck held in mild rotation to R and R lat flex  Cervical ROM:    Active A/PROM (deg) eval  Flexion WFL  Extension 25%  Right lateral flexion 25%  Left lateral flexion 25%  Right rotation 30%  Left rotation 30%  (Blank rows = not tested)  Spurling's (-)  LOWER EXTREMITY MMT: WNL BLE  FUNCTIONAL MOBILITY: I/MI with all bed mobility, transfers, ambulates up and down steps with U rail, pausing every 4 steps  GAIT: Gait pattern: step through pattern, decreased stride length, shuffling,  and ataxic Distance walked: In clinic distances Assistive device utilized: None Level of assistance: Modified independence Comments: Patient reports she is fearful of walking outdoors due to fear of falling.  FUNCTIONAL TESTS:  Functional gait assessment: 16/28, increased fall risk   VESTIBULAR ASSESSMENT:  GENERAL OBSERVATION: chronic cervical tremors with head maintained in R rotaiton and lat flex   SYMPTOM BEHAVIOR:  Subjective history: Had ear surgery, R ear to remove a tumor, she had possible Meningitis as a child with temporary R sided weakness  Non-Vestibular symptoms:  Recently diagnosed with cervical dystonia, cervical tremors  Type of dizziness: Imbalance (Disequilibrium), Lightheadedness/Faint, and stepping backwards seems more challenging sometimes.  Frequency: constant  Duration: constant  Aggravating factors: No known aggravating factors  Relieving factors: lying supine  Progression of symptoms: worse  OCULOMOTOR EXAM:  Ocular Alignment: normal  Ocular ROM: No Limitations  Spontaneous Nystagmus: absent  Gaze-Induced Nystagmus: absent  Smooth Pursuits: intact  Saccades: intact  Convergence/Divergence: 10 cm   VESTIBULAR - OCULAR REFLEX:   Slow VOR: Normal  VOR Cancellation: Comment: Reported a slight blurriness when moving to the R  Head-Impulse Test: HIT Right: negative HIT Left: negative    POSITIONAL TESTING: Right Dix-Hallpike: TBD Left Dix-Hallpike: TBD   FUNCTIONAL GAIT: Functional gait assessment: 16/28   VESTIBULAR TREATMENT:                                                                                                   DATE:  06/29/23 Education  PATIENT EDUCATION: Education details: POC Person educated: Patient Education method: Explanation Education comprehension: verbalized understanding  HOME EXERCISE PROGRAM:  GOALS: Goals reviewed with patient? Yes  SHORT TERM GOALS: Target date: 07/20/23  I with initial HEP Baseline: Goal  status: INITIAL LONG TERM GOALS: Target date: 09/07/23  I with final HEP Baseline:  Goal status: INITIAL  2.  Increase FGA score to at least 24/28 Baseline:  Goal status: INITIAL  3.  Patient will achieve at least 75% of normal cervical ROM. Baseline:  Goal status: INITIAL  4.  Patient will ambulate at least 500' on paved surfaces I, with no episodes of balance loss Baseline:  Goal status:  INITIAL  5.  Patient will report decrease is dizziness/unsteadiness episodes by 50% Baseline: Constant Goal status: INITIAL  ASSESSMENT:  CLINICAL IMPRESSION: Patient is a 77 y.o. who was seen today for physical therapy evaluation and treatment for recurrent and increasing dizziness and unsteady gait. She was seen 2 years ago for B BPPV and VOR re-education with improved symptoms. She presents with cervical dystonia and tremors tremors and stiffness, constant mild unsteadiness, no dizziness. She reports increased neck and shoulder pain. VOR testing was Neg, but score on FGA indicated increased fall risk. Unable to complete assessment due to the depth of testing required, will complete BPPV and m-CTSIB assessment on next visit further clarify which sensory systems may be contributing to her imbalance, but she will benefit from PT to address her tightness and poor balance to decrease fall risk and dizziness. She has also started to see a Neurologist who may be able to further clarify the causes of her dificits  OBJECTIVE IMPAIRMENTS: Abnormal gait, decreased activity tolerance, decreased balance, decreased coordination, difficulty walking, decreased ROM, decreased strength, increased muscle spasms, postural dysfunction, and pain.   ACTIVITY LIMITATIONS: carrying, lifting, bending, standing, squatting, sleeping, stairs, and locomotion level  PARTICIPATION LIMITATIONS: meal prep, cleaning, laundry, driving, shopping, and community activity  PERSONAL FACTORS: Past/current experiences are also affecting  patient's functional outcome.   REHAB POTENTIAL: Good  CLINICAL DECISION MAKING: Evolving/moderate complexity  EVALUATION COMPLEXITY: Moderate   PLAN:  PT FREQUENCY: 2x/week  PT DURATION: 10 weeks  PLANNED INTERVENTIONS: 97110-Therapeutic exercises, 97530- Therapeutic activity, 97112- Neuromuscular re-education, 97535- Self Care, 21308- Manual therapy, [phone]- Gait training, [phone]- Canalith repositioning, [phone]- Traction (mechanical), Patient/Family education, Balance training, Stair training, Dry Needling, Joint mobilization, Spinal mobilization, Vestibular training, Visual/preceptual remediation/compensation, Cryotherapy, and Moist heat  PLAN FOR NEXT SESSION: BPPV assessment, m-CTSIB assessment   Iona Beard, DPT 06/29/2023, 2:58 PM

## 2023-06-29 ENCOUNTER — Encounter: Payer: Self-pay | Admitting: Physical Therapy

## 2023-06-29 ENCOUNTER — Ambulatory Visit: Payer: Medicare Other | Attending: Family Medicine | Admitting: Physical Therapy

## 2023-06-29 DIAGNOSIS — R42 Dizziness and giddiness: Secondary | ICD-10-CM | POA: Diagnosis not present

## 2023-06-29 DIAGNOSIS — R252 Cramp and spasm: Secondary | ICD-10-CM | POA: Diagnosis not present

## 2023-06-29 DIAGNOSIS — R2681 Unsteadiness on feet: Secondary | ICD-10-CM | POA: Insufficient documentation

## 2023-07-01 NOTE — Progress Notes (Unsigned)
Assessment/Plan:   ***   Subjective:   Renee [name] was seen today in follow up for cervical dystonia.  Her primary care asked me to also see her for dizziness.  I saw her in March, 2023.  It was our only visit.  She had previously seen Dr. Sandria Manly as well for the same.  Patient and I discussed Botox and she was not interested at the time.  She saw her primary care and follow-up about 6 weeks ago and was complaining about being off balance and was referred here for dizziness.  She was also sent to physical therapy.  She just saw that physical therapist for the first time on November 6.  Interestingly, PT notes that the patient was seen 2 years ago for BPPV.   PREVIOUS MEDICATIONS: {Parkinson's RX:18200}  CURRENT MEDICATIONS:  Outpatient Encounter Medications as of 07/05/2023  Medication Sig   atorvastatin (LIPITOR) 10 MG tablet Take 0.5 tablets (5 mg total) by mouth every other day.   b complex vitamins tablet Take 1 tablet by mouth once a week.   clobetasol cream (TEMOVATE) 0.05 % Apply 1 Application topically 2 (two) times daily as needed (Rash).   Coenzyme Q10 200 MG capsule Take 200 mg by mouth once a week.    famotidine (PEPCID) 20 MG tablet TAKE 1 TABLET BY MOUTH TWICE A DAY (Patient taking differently: Take 20 mg by mouth as needed for heartburn or indigestion.)   fluticasone (FLONASE) 50 MCG/ACT nasal spray Place 2 sprays into both nostrils daily. (Patient taking differently: Place 2 sprays into both nostrils daily as needed for allergies or rhinitis.)   hydrocortisone 2.5 % cream Apply 1 Application topically 2 (two) times daily.   metoprolol succinate (TOPROL-XL) 25 MG 24 hr tablet Take 0.5 tablets (12.5 mg total) by mouth daily. Take an extra metoprolol daily as needed.   Multiple Vitamin (MULTIVITAMIN WITH MINERALS) TABS tablet Take 1 tablet by mouth every other day.   triamcinolone cream (KENALOG) 0.1 % Apply 1 Application topically 2 (two) times daily.   Vitamin D,  Ergocalciferol, (DRISDOL) 1.25 MG (50000 UNIT) CAPS capsule Take 1 capsule (50,000 Units total) by mouth every 7 (seven) days.   No facility-administered encounter medications on file as of 07/05/2023.     Objective:   PHYSICAL EXAMINATION:    VITALS:  There were no vitals filed for this visit.  GEN:  The patient appears stated age and is in NAD. HEENT:  Normocephalic, atraumatic.  The mucous membranes are moist. The superficial temporal arteries are without ropiness or tenderness. CV:  RRR Lungs:  CTAB Neck/HEME:  There are no carotid bruits bilaterally.  There is an irregular, moderate to severe head tremor.  Neck is turned to the right.  Neurological examination:  Orientation:  The patient is alert and oriented x 3.   Cranial nerves: There is good facial symmetry. Extraocular muscles are intact and visual fields are full to confrontational testing. Speech is fluent and clear. Soft palate rises symmetrically and there is no tongue deviation. Hearing is intact to conversational tone. Tone: Tone is good throughout. Sensation: Sensation is intact to light touch touch throughout (facial, trunk, extremities). Vibration is intact at the bilateral big toe. There is no extinction with double simultaneous stimulation. There is no sensory dermatomal level identified. Coordination:  The patient has no dysdiadichokinesia or dysmetria. Motor: Strength is 5/5 in the bilateral upper and lower extremities.  Shoulder shrug is equal bilaterally.  There is no pronator drift.  There are no fasciculations noted. Gait and Station: The patient is able to ambulate without difficulty. The patient is able to heel toe walk without any difficulty. The patient is able to ambulate in a tandem fashion. The patient is able to stand in the Romberg position.     ***Total time spent on today's visit was *** minutes, including both face-to-face time and nonface-to-face time.  Time included that spent on review of  records (prior notes available to me/labs/imaging if pertinent), discussing treatment and goals, answering patient's questions and coordinating care.  Cc:  [name], MD

## 2023-07-05 ENCOUNTER — Encounter: Payer: Self-pay | Admitting: Neurology

## 2023-07-05 ENCOUNTER — Ambulatory Visit: Payer: Medicare Other | Admitting: Neurology

## 2023-07-05 VITALS — Ht 66.0 in | Wt 223.8 lb

## 2023-07-05 DIAGNOSIS — R42 Dizziness and giddiness: Secondary | ICD-10-CM

## 2023-07-05 DIAGNOSIS — G243 Spasmodic torticollis: Secondary | ICD-10-CM | POA: Diagnosis not present

## 2023-07-05 NOTE — Patient Instructions (Signed)
We will schedule carotid ultrasound We discussed MRI brain but you decided to hold for now Schedule an appointment with your ENT physician Let us know if you want to schedule botox  The physicians and staff at [facility] are committed to providing excellent care. You may receive a survey requesting feedback about your experience at our office. We strive to receive "very good" responses to the survey questions. If you feel that your experience would prevent you from giving the office a "very good " response, please contact our office to try to remedy the situation. We may be reached at [phone]. Thank you for taking the time out of your busy day to complete the survey.

## 2023-07-06 ENCOUNTER — Telehealth (HOSPITAL_BASED_OUTPATIENT_CLINIC_OR_DEPARTMENT_OTHER): Payer: Self-pay

## 2023-07-06 ENCOUNTER — Ambulatory Visit (HOSPITAL_BASED_OUTPATIENT_CLINIC_OR_DEPARTMENT_OTHER)
Admission: RE | Admit: 2023-07-06 | Discharge: 2023-07-06 | Disposition: A | Discharge status: Home / Self Care | Payer: Medicare Other | Source: Ambulatory Visit | Attending: Cardiology | Admitting: Cardiology

## 2023-07-06 DIAGNOSIS — Z8249 Family history of ischemic heart disease and other diseases of the circulatory system: Secondary | ICD-10-CM | POA: Insufficient documentation

## 2023-07-08 DIAGNOSIS — L570 Actinic keratosis: Secondary | ICD-10-CM | POA: Diagnosis not present

## 2023-07-08 DIAGNOSIS — L814 Other melanin hyperpigmentation: Secondary | ICD-10-CM | POA: Diagnosis not present

## 2023-07-08 DIAGNOSIS — Z129 Encounter for screening for malignant neoplasm, site unspecified: Secondary | ICD-10-CM | POA: Diagnosis not present

## 2023-07-08 DIAGNOSIS — L821 Other seborrheic keratosis: Secondary | ICD-10-CM | POA: Diagnosis not present

## 2023-07-12 ENCOUNTER — Ambulatory Visit: Payer: Medicare Other | Admitting: Physical Therapy

## 2023-07-12 DIAGNOSIS — R252 Cramp and spasm: Secondary | ICD-10-CM

## 2023-07-12 DIAGNOSIS — R2681 Unsteadiness on feet: Secondary | ICD-10-CM | POA: Diagnosis not present

## 2023-07-12 DIAGNOSIS — R42 Dizziness and giddiness: Secondary | ICD-10-CM

## 2023-07-12 NOTE — Therapy (Signed)
OUTPATIENT PHYSICAL THERAPY VESTIBULAR EVALUATION  Patient Name: Renee [name] MRN: [id number] DOB:1946-06-10, 77 y.o., female Today's Date: 07/12/2023  END OF SESSION:  PT End of Session - 07/12/23 1015     Visit Number 2    Date for PT Re-Evaluation 09/07/23    Authorization Type [employer]    Progress Note Due on Visit 10    PT Start Time 1015    PT Stop Time 1055    PT Time Calculation (min) 40 min    Activity Tolerance Patient tolerated treatment well    Behavior During Therapy [facility] for tasks assessed/performed              Past Medical History:  Diagnosis Date   Abdominal pain 07/05/2017   ABNORMAL INVOLUNTARY MOVEMENTS 01/01/2009   Qualifier: Diagnosis of  By: Alwyn Ren MD, William     Allergic rhinitis 09/19/2008   Qualifier: Diagnosis of  By: Nena Jordan    Allergy    Arthralgia of right wrist 05/06/2020   Atypical chest pain 09/19/2008   Qualifier: Diagnosis of  By: Nena Jordan    Cervical cancer screening 05/08/2013   Menarche 11, regular Menopause 50s G2P2 s/p 2 svd No abnormal paps or [employer] Fatty cyst on labia removed.    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   Qualifier: Diagnosis of  By: Nena Jordan    Dermatitis 09/17/2018   Diverticulosis 05/06/2017   Essential hypertension 09/19/2008   Qualifier: Diagnosis of  By: Nena Jordan    GERD (gastroesophageal reflux disease)    H/O osteopenia 02/08/2016   H/O tobacco use, presenting hazards to health 09/19/2008   Qualifier: Diagnosis of  By: Nena Jordan Smoking roughly 5 cig daily  Last cigarette was in December of 2016   Head tremor 12/27/2016   Hearing loss in right ear 09/19/2008   Qualifier: Diagnosis of  By: Nena Jordan    Hx of adenomatous colonic polyps 01/27/2018   Hyperglycemia 10/07/2016   Hyperlipidemia, mixed  10/03/2008   Qualifier: Diagnosis of  By: Nena Jordan    Lumbar radiculopathy 07/06/2021   Neuromuscular disorder (HCC)    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 Fixed sigmoid colon prevents. Referred for surgery.   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 02/09/2017   Tinnitus 12/12/2017   Tremor, essential    Vertigo    Vertigo    Past Surgical History:  Procedure Laterality Date   INNER EAR SURGERY  2015   with tube   MOUTH SURGERY  01/22/11   lower denture secured by titanium bolts   rectal suction biospy     XI ROBOTIC ASSISTED LOWER ANTERIOR RESECTION N/A 02/09/2017   Procedure: XI  ROBOTIC RESECTION OF SIGMOID COLON, RIGID PROCTOSCOPY;  Surgeon: Karie Soda, MD;  Location: [facility];  Service: General;  Laterality: N/A;   Patient Active Problem List   Diagnosis Date Noted   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   Unsteady gait 05/11/2023   Myalgia 02/02/2023   Vitamin D deficiency 07/29/2022   Lumbar radiculopathy 07/06/2021   Osteopenia 05/14/2021   Change in bowel habits 05/14/2021   Plantar wart of both feet 10/26/2020   Dizziness    Tremor, essential    Neuromuscular disorder (HCC)    Allergy    Arthralgia of right wrist 05/06/2020   Strain of gluteus medius of right lower extremity 05/06/2020   Right hip pain 04/28/2020   Right chronic serous otitis media 05/21/2019   Dermatitis 09/17/2018   Shoulder pain 06/29/2018   Hx of adenomatous  colonic polyps 01/27/2018   Tinnitus 12/12/2017   Rib pain on left side 11/17/2017   Abdominal pain 07/05/2017   PVC's (premature ventricular contractions) 05/10/2017   Diverticulosis 05/06/2017   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 02/09/2017   Head tremor 12/27/2016   Polyp of sigmoid colon 12/08/2016   Hyperglycemia 10/07/2016   Obesity 07/06/2016   GERD (gastroesophageal reflux disease) 07/06/2016   H/O osteopenia 02/08/2016   Cervical pain (neck) 10/07/2015   Preventative health care 05/12/2013   Cervical cancer screening 05/08/2013   Abnormal involuntary movement 01/01/2009   Hyperlipidemia, mixed 10/03/2008   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   Essential hypertension 09/19/2008   Allergic rhinitis 09/19/2008   Atypical chest pain 09/19/2008   Hearing loss in right ear 09/19/2008   CHEST PAIN 09/19/2008    PCP: [name], MD REFERRING PROVIDER: [name], MD  REFERRING DIAG:  R42 (ICD-10-CM) - Dizziness  R26.81 (ICD-10-CM) - Unsteady gait    THERAPY DIAG:  Dizziness and giddiness  Unsteadiness on feet  Cramp and spasm  ONSET DATE: 05/10/23  Rationale for Evaluation and Treatment: Rehabilitation  SUBJECTIVE:   SUBJECTIVE STATEMENT: Pt saw neurologist who suggested to get vertebral artery tested to make sure there's no blockages. Pt reports maybe more lightheadedness than dizziness. Not spinning. The other day she did feel dizzy but not sure why.   From eval: Patient reports chronic head tremor, had PT for previously. She has always had some dizziness, but it has gotten worse recently, continuous. She recently obtained hearing aides and saw a neurologist who diagnosed her with cervical dystonia. She saw an ophthalmologist yesterday and her vision is normal. Pt accompanied by: self  PERTINENT HISTORY:  Per referring Dr note: Chronic Dizziness Persistent, non-vertiginous dizziness, worse with ambulation. No clear pattern or  associated symptoms. No recent head trauma. Possible multifactorial etiology including cervical dystonia and hearing loss. -Order MRI to rule out central causes. -Refer back to neurology for further evaluation. -Consider physical therapy for unsteady gait and dizziness.   Cervical Dystonia Chronic condition contributing to dizziness and muscle tension. Patient reluctant to try Botox therapy. -Discussed potential benefits of Botox therapy for symptom relief and prevention of further spinal damage. Patient to consider.   Hearing Loss Patient in process of getting hearing aids. Potential contributor to dizziness. -Encourage continuation of audiology follow-up and hearing aid trial.  PAIN:  Are you having pain? Yes: NPRS scale: 6/10 Pain location: R neck Pain description: stiffness Aggravating factors: Nothing identified Relieving factors: Tylenol  PRECAUTIONS: Fall  RED FLAGS: None   WEIGHT BEARING RESTRICTIONS: No  FALLS: Has patient fallen in last 6 months? No  LIVING ENVIRONMENT: Lives with: lives with their family and lives with their spouse Lives in: House/apartment Stairs: Yes: Internal: 14 steps; on right going up Has following equipment at home: Single point cane  PLOF: Independent, likes to walk, but limited due to fear of falling, Tai-Chi 3 x a week.  PATIENT GOALS: Patient would like to identify the cause of the dizziness and treat it.  OBJECTIVE:  Note: Objective measures were completed at Evaluation unless otherwise noted.  DIAGNOSTIC FINDINGS: N/A  COGNITION: Overall cognitive status: Within functional limits for tasks assessed   SENSATION: Denies changes  EDEMA:  Denies edema  MUSCLE TONE:  WNL  POSTURE:  rounded shoulders and cervical tremor with neck held in mild rotation to R and R lat flex  Cervical ROM:    Active A/PROM (deg) eval  Flexion WFL  Extension 25%  Right lateral flexion 25%  Left lateral flexion 25%  Right rotation 30%   Left rotation 30%  (Blank rows = not tested)  Spurling's (-)  LOWER EXTREMITY MMT: WNL BLE  FUNCTIONAL MOBILITY: I/MI with all bed mobility, transfers, ambulates up and down steps with U rail, pausing every 4 steps  GAIT: Gait pattern: step through pattern, decreased stride length, shuffling, and ataxic Distance walked: In clinic distances Assistive device utilized: None Level of assistance: Modified independence Comments: Patient reports she is fearful of walking outdoors due to fear of falling.  FUNCTIONAL TESTS:  Functional gait assessment: 16/28, increased fall risk MCTSIB: condition 1: 30 sec, condition 2: 30 sec; condition 3: 25 sec, condition 4: 30 sec  VESTIBULAR ASSESSMENT:  GENERAL OBSERVATION: chronic cervical tremors with head maintained in R rotaiton and lat flex   SYMPTOM BEHAVIOR:  Subjective history: Had ear surgery, R ear to remove a tumor, she had possible Meningitis as a child with temporary R sided weakness  Non-Vestibular symptoms:  Recently diagnosed with cervical dystonia, cervical tremors  Type of dizziness: Imbalance (Disequilibrium), Lightheadedness/Faint, and stepping backwards seems more challenging sometimes.  Frequency: constant  Duration: constant  Aggravating factors: No known aggravating factors  Relieving factors: lying supine  Progression of symptoms: worse  OCULOMOTOR EXAM:  Ocular Alignment: normal  Ocular ROM: No Limitations  Spontaneous Nystagmus: absent  Gaze-Induced Nystagmus: absent  Smooth Pursuits: intact  Saccades: intact  Convergence/Divergence: 10 cm   VESTIBULAR - OCULAR REFLEX:   Slow VOR: Normal  VOR Cancellation: Comment: Reported a slight blurriness when moving to the R  Head-Impulse Test: HIT Right: negative HIT Left: negative    POSITIONAL TESTING:  Dix-Hallpike Right: (-) Dix-Hallpike Left: (-) Sidelying Right and Left (-)  FUNCTIONAL GAIT: Functional gait assessment: 16/28   VESTIBULAR TREATMENT:                                                                                                    DATE:  07/12/23 Sitting levator stretch x 30 sec R&L Sitting anterior scalene stretch x 30 sec R&L Sitting upper trap stretch x 30 sec R&L Manual therapy: STM & TPR UT, levator, cervical paraspinals See assessment above Sitting thoracic extension x10 Sitting thoracic extension +  rotation x10 Feet together VOR x1 horizontal and then vertical x30 sec each (reports increased dizziness with vertical) Feet together VOR cancellation horizontal and then vertical x30 sec each (increased dizziness with vertical)  06/29/23 Education  PATIENT EDUCATION: Education details: POC Person educated: Patient Education method: Explanation Education comprehension: verbalized understanding  HOME EXERCISE PROGRAM:  GOALS: Goals reviewed with patient? Yes  SHORT TERM GOALS: Target date: 07/20/23  I with initial HEP Baseline: Goal status: INITIAL LONG TERM GOALS: Target date: 09/07/23  I with final HEP Baseline:  Goal status: INITIAL  2.  Increase FGA score to at least 24/28 Baseline:  Goal status: INITIAL  3.  Patient will achieve at least 75% of normal cervical ROM. Baseline:  Goal status: INITIAL  4.  Patient will ambulate at least 500' on paved surfaces I, with no episodes of balance loss Baseline:  Goal status: INITIAL  5.  Patient will report decrease is dizziness/unsteadiness episodes by 50% Baseline: Constant Goal status: INITIAL  ASSESSMENT:  CLINICAL IMPRESSION: Treatment focused on finishing assessment items. BPPV screening (-) today. No tinnitus or classic s/s of VBI during Dix-Hallpike; however, pt did note increased dizzy symptoms when performing VOR and VOR cancellation with vertical movements. During mCTSIB found pt to have most difficulty with condition 3. Initiated gaze stabilization and neck exercises for home.   From eval: Patient is a 77 y.o. who was seen today for physical  therapy evaluation and treatment for recurrent and increasing dizziness and unsteady gait. She was seen 2 years ago for B BPPV and VOR re-education with improved symptoms. She presents with cervical dystonia and tremors tremors and stiffness, constant mild unsteadiness, no dizziness. She reports increased neck and shoulder pain. VOR testing was Neg, but score on FGA indicated increased fall risk. Unable to complete assessment due to the depth of testing required, will complete BPPV and m-CTSIB assessment on next visit further clarify which sensory systems may be contributing to her imbalance, but she will benefit from PT to address her tightness and poor balance to decrease fall risk and dizziness. She has also started to see a Neurologist who may be able to further clarify the causes of her dificits  OBJECTIVE IMPAIRMENTS: Abnormal gait, decreased activity tolerance, decreased balance, decreased coordination, difficulty walking, decreased ROM, decreased strength, increased muscle spasms, postural dysfunction, and pain.   ACTIVITY LIMITATIONS: carrying, lifting, bending, standing, squatting, sleeping, stairs, and locomotion level  PARTICIPATION LIMITATIONS: meal prep, cleaning, laundry, driving, shopping, and community activity  PERSONAL FACTORS: Past/current experiences are also affecting patient's functional outcome.   REHAB POTENTIAL: Good  CLINICAL DECISION MAKING: Evolving/moderate complexity  EVALUATION COMPLEXITY: Moderate   PLAN:  PT FREQUENCY: 2x/week  PT DURATION: 10 weeks  PLANNED INTERVENTIONS: 97110-Therapeutic exercises, 97530- Therapeutic activity, [id number]- Neuromuscular re-education, 97535- Self Care, [id number]- Manual therapy, [id number]- Gait training, [phone]- Canalith repositioning, [phone]- Traction (mechanical), Patient/Family education, Balance training, Stair training, Dry Needling, Joint mobilization, Spinal mobilization, Vestibular training, Visual/preceptual  remediation/compensation, Cryotherapy, and Moist heat  PLAN FOR NEXT SESSION: Continue balance and neck/postural stretching/strengthening.   Malu Pellegrini April Ma L Cleva Camero, PT, DPT 07/12/2023, 10:15 AM

## 2023-07-18 NOTE — Therapy (Signed)
OUTPATIENT PHYSICAL THERAPY VESTIBULAR TREATMENT  Patient Name: Renee [name] MRN: [id number] DOB:Jan 11, 1946, 77 y.o., female Today's Date: 07/19/2023  END OF SESSION:  PT End of Session - 07/19/23 0924     Visit Number 3    Date for PT Re-Evaluation 09/07/23    Authorization Type [employer]    Progress Note Due on Visit 10    PT Start Time 0925    PT Stop Time 1010    PT Time Calculation (min) 45 min    Activity Tolerance Patient tolerated treatment well    Behavior During Therapy [facility] for tasks assessed/performed               Past Medical History:  Diagnosis Date   Abdominal pain 07/05/2017   ABNORMAL INVOLUNTARY MOVEMENTS 01/01/2009   Qualifier: Diagnosis of  By: Alwyn Ren MD, William     Allergic rhinitis 09/19/2008   Qualifier: Diagnosis of  By: Nena Jordan    Allergy    Arthralgia of right wrist 05/06/2020   Atypical chest pain 09/19/2008   Qualifier: Diagnosis of  By: Nena Jordan    Cervical cancer screening 05/08/2013   Menarche 11, regular Menopause 50s G2P2 s/p 2 svd No abnormal paps or [employer] Fatty cyst on labia removed.    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   Qualifier: Diagnosis of  By: Nena Jordan    Dermatitis 09/17/2018   Diverticulosis 05/06/2017   Essential hypertension 09/19/2008   Qualifier: Diagnosis of  By: Nena Jordan    GERD (gastroesophageal reflux disease)    H/O osteopenia 02/08/2016   H/O tobacco use, presenting hazards to health 09/19/2008   Qualifier: Diagnosis of  By: Nena Jordan Smoking roughly 5 cig daily  Last cigarette was in December of 2016   Head tremor 12/27/2016   Hearing loss in right ear 09/19/2008   Qualifier: Diagnosis of  By: Nena Jordan    Hx of adenomatous colonic polyps 01/27/2018   Hyperglycemia 10/07/2016   Hyperlipidemia, mixed  10/03/2008   Qualifier: Diagnosis of  By: Nena Jordan    Lumbar radiculopathy 07/06/2021   Neuromuscular disorder (HCC)    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 Fixed sigmoid colon prevents. Referred for surgery.   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 02/09/2017   Tinnitus 12/12/2017   Tremor, essential    Vertigo    Vertigo    Past Surgical History:  Procedure Laterality Date   INNER EAR SURGERY  2015   with tube   MOUTH SURGERY  01/22/11   lower denture secured by titanium bolts   rectal suction biospy     XI ROBOTIC ASSISTED LOWER ANTERIOR RESECTION N/A 02/09/2017   Procedure:  XI ROBOTIC RESECTION OF SIGMOID COLON, RIGID PROCTOSCOPY;  Surgeon: Karie Soda, MD;  Location: [facility];  Service: General;  Laterality: N/A;   Patient Active Problem List   Diagnosis Date Noted   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   Unsteady gait 05/11/2023   Myalgia 02/02/2023   Vitamin D deficiency 07/29/2022   Lumbar radiculopathy 07/06/2021   Osteopenia 05/14/2021   Change in bowel habits 05/14/2021   Plantar wart of both feet 10/26/2020   Dizziness    Tremor, essential    Neuromuscular disorder (HCC)    Allergy    Arthralgia of right wrist 05/06/2020   Strain of gluteus medius of right lower extremity 05/06/2020   Right hip pain 04/28/2020   Right chronic serous otitis media 05/21/2019   Dermatitis 09/17/2018   Shoulder pain 06/29/2018   Hx of adenomatous  colonic polyps 01/27/2018   Tinnitus 12/12/2017   Rib pain on left side 11/17/2017   Abdominal pain 07/05/2017   PVC's (premature ventricular contractions) 05/10/2017   Diverticulosis 05/06/2017   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 02/09/2017   Head tremor 12/27/2016   Polyp of sigmoid colon 12/08/2016   Hyperglycemia 10/07/2016   Obesity 07/06/2016   GERD (gastroesophageal reflux disease) 07/06/2016   H/O osteopenia 02/08/2016   Cervical pain (neck) 10/07/2015   Preventative health care 05/12/2013   Cervical cancer screening 05/08/2013   Abnormal involuntary movement 01/01/2009   Hyperlipidemia, mixed 10/03/2008   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 I was having a lot of pain in my neck after last visit. It was really sore. I don't know if the soreness is coming from doing the exercises.   From eval: Patient reports chronic head tremor, had PT for previously. She has always had some dizziness, but it has gotten worse recently, continuous. She recently obtained hearing aides and saw a neurologist who diagnosed her with cervical dystonia. She saw an ophthalmologist yesterday and her vision is normal. Pt accompanied by: self  PERTINENT HISTORY:  Per referring Dr note: Chronic Dizziness Persistent, non-vertiginous dizziness, worse with ambulation. No clear pattern or associated symptoms. No recent head  trauma. Possible multifactorial etiology including cervical dystonia and hearing loss. -Order MRI to rule out central causes. -Refer back to neurology for further evaluation. -Consider physical therapy for unsteady gait and dizziness.   Cervical Dystonia Chronic condition contributing to dizziness and muscle tension. Patient reluctant to try Botox therapy. -Discussed potential benefits of Botox therapy for symptom relief and prevention of further spinal damage. Patient to consider.   Hearing Loss Patient in process of getting hearing aids. Potential contributor to dizziness. -Encourage continuation of audiology follow-up and hearing aid trial.  PAIN:  Are you having pain? Yes: NPRS scale: 3/10 Pain location: R neck Pain description: stiffness Aggravating factors: Nothing identified Relieving factors: Tylenol  PRECAUTIONS: Fall  RED FLAGS: None   WEIGHT BEARING RESTRICTIONS: No  FALLS: Has patient fallen in last 6  months? No  LIVING ENVIRONMENT: Lives with: lives with their family and lives with their spouse Lives in: House/apartment Stairs: Yes: Internal: 14 steps; on right going up Has following equipment at home: Single point cane  PLOF: Independent, likes to walk, but limited due to fear of falling, Tai-Chi 3 x a week.  PATIENT GOALS: Patient would like to identify the cause of the dizziness and treat it.  OBJECTIVE:  Note: Objective measures were completed at Evaluation unless otherwise noted.  DIAGNOSTIC FINDINGS: N/A  COGNITION: Overall cognitive status: Within functional limits for tasks assessed   SENSATION: Denies changes  EDEMA:  Denies edema  MUSCLE TONE:  WNL  POSTURE:  rounded shoulders and cervical tremor with neck held in mild rotation to R and R lat flex  Cervical ROM:    Active A/PROM (deg) eval  Flexion WFL  Extension 25%  Right lateral flexion 25%  Left lateral flexion 25%  Right rotation 30%  Left rotation 30%  (Blank rows = not  tested)  Spurling's (-)  LOWER EXTREMITY MMT: WNL BLE  FUNCTIONAL MOBILITY: I/MI with all bed mobility, transfers, ambulates up and down steps with U rail, pausing every 4 steps  GAIT: Gait pattern: step through pattern, decreased stride length, shuffling, and ataxic Distance walked: In clinic distances Assistive device utilized: None Level of assistance: Modified independence Comments: Patient reports she is fearful of walking outdoors due to fear of falling.  FUNCTIONAL TESTS:  Functional gait assessment: 16/28, increased fall risk MCTSIB: condition 1: 30 sec, condition 2: 30 sec; condition 3: 25 sec, condition 4: 30 sec  VESTIBULAR ASSESSMENT:  GENERAL OBSERVATION: chronic cervical tremors with head maintained in R rotaiton and lat flex   SYMPTOM BEHAVIOR:  Subjective history: Had ear surgery, R ear to remove a tumor, she had possible Meningitis as a child with temporary R sided weakness  Non-Vestibular symptoms:  Recently diagnosed with cervical dystonia, cervical tremors  Type of dizziness: Imbalance (Disequilibrium), Lightheadedness/Faint, and stepping backwards seems more challenging sometimes.  Frequency: constant  Duration: constant  Aggravating factors: No known aggravating factors  Relieving factors: lying supine  Progression of symptoms: worse  OCULOMOTOR EXAM:  Ocular Alignment: normal  Ocular ROM: No Limitations  Spontaneous Nystagmus: absent  Gaze-Induced Nystagmus: absent  Smooth Pursuits: intact  Saccades: intact  Convergence/Divergence: 10 cm   VESTIBULAR - OCULAR REFLEX:   Slow VOR: Normal  VOR Cancellation: Comment: Reported a slight blurriness when moving to the R  Head-Impulse Test: HIT Right: negative HIT Left: negative    POSITIONAL TESTING:  Dix-Hallpike Right: (-) Dix-Hallpike Left: (-) Sidelying Right and Left (-)  FUNCTIONAL GAIT: Functional gait assessment: 16/28   VESTIBULAR TREATMENT:                                                                                                    DATE:  07/19/23 HEP review  UBE L2 x84mins each way  STM to neck and passive stretching- some use of massage gun Chin tucks against resistance red 2x10 VOR x1 and x2 seated 15 reps Walking on beam  07/12/23 Sitting levator stretch x 30 sec R&L Sitting anterior scalene stretch x 30 sec R&L Sitting upper trap stretch x 30 sec R&L Manual therapy: STM & TPR UT, levator, cervical paraspinals See assessment above Sitting thoracic extension x10 Sitting thoracic extension + rotation x10 Feet together VOR x1 horizontal and then vertical x30 sec each (reports increased dizziness with vertical) Feet together VOR cancellation horizontal and then vertical x30 sec each (increased dizziness with vertical)  06/29/23 Education  PATIENT EDUCATION: Education details: POC Person educated: Patient Education method: Explanation Education comprehension: verbalized understanding  HOME EXERCISE PROGRAM:  GOALS: Goals reviewed with patient? Yes  SHORT TERM GOALS: Target date: 07/20/23  I with initial HEP Baseline: Goal status: INITIAL LONG TERM GOALS: Target date: 09/07/23  I with final HEP Baseline:  Goal status: INITIAL  2.  Increase FGA score to at least 24/28 Baseline:  Goal status: INITIAL  3.  Patient will achieve at least 75% of normal cervical ROM. Baseline:  Goal status: INITIAL  4.  Patient will ambulate at least 500' on paved surfaces I, with no episodes of balance loss Baseline:  Goal status: INITIAL  5.  Patient will report decrease is dizziness/unsteadiness episodes by 50% Baseline: Constant Goal status: INITIAL  ASSESSMENT:  CLINICAL IMPRESSION: Patient returns with complaints of soreness in the R side of her neck. She reports this is worse after she does her exercises, so she has not been doing them everyday. We went through all the exercises she has to make sure she is doing them with proper form. Continued to  work on neck ROM, tightness, as well as work on some VOR for her dizziness. A little dizzy with VOR x2 with head nods. She has tightness along her R upper trap and some trigger points along medial border of scapula.   From eval: Patient is a 77 y.o. who was seen today for physical therapy evaluation and treatment for recurrent and increasing dizziness and unsteady gait. She was seen 2 years ago for B BPPV and VOR re-education with improved symptoms. She presents with cervical dystonia and tremors tremors and stiffness, constant mild unsteadiness, no dizziness. She reports increased neck and shoulder pain. VOR testing was Neg, but score on FGA indicated increased fall risk. Unable to complete assessment due to the depth of testing required, will complete BPPV and m-CTSIB assessment on next visit further clarify which sensory systems may be contributing to her imbalance, but she will benefit from PT to address her tightness and poor balance to decrease fall risk and dizziness. She has also started to see a Neurologist who may be able to further clarify the causes of her dificits  OBJECTIVE IMPAIRMENTS: Abnormal gait, decreased activity tolerance, decreased balance, decreased coordination, difficulty walking, decreased ROM, decreased strength, increased muscle spasms, postural dysfunction, and pain.   ACTIVITY LIMITATIONS: carrying, lifting, bending, standing, squatting, sleeping, stairs, and locomotion level  PARTICIPATION LIMITATIONS: meal prep, cleaning, laundry, driving, shopping, and community activity  PERSONAL FACTORS: Past/current experiences are also affecting patient's functional outcome.   REHAB POTENTIAL: Good  CLINICAL DECISION MAKING: Evolving/moderate complexity  EVALUATION COMPLEXITY: Moderate   PLAN:  PT FREQUENCY: 2x/week  PT DURATION: 10 weeks  PLANNED INTERVENTIONS: 97110-Therapeutic exercises, 97530- Therapeutic activity, [id number]- Neuromuscular re-education, 97535- Self Care,  [id number]- Manual therapy, [id number]- Gait training, 54098- Canalith repositioning, 11914- Traction (mechanical), Patient/Family education, Balance training, Stair training, Dry Needling, Joint mobilization, Spinal mobilization, Vestibular training, Visual/preceptual remediation/compensation, Cryotherapy, and Moist heat  PLAN FOR NEXT SESSION: Continue balance  and neck/postural stretching/strengthening. Standing on EC, EO, Head turns on airex   [place], [place], DPT 07/19/2023, 10:17 AM

## 2023-07-19 ENCOUNTER — Ambulatory Visit: Payer: Medicare Other

## 2023-07-19 DIAGNOSIS — R2681 Unsteadiness on feet: Secondary | ICD-10-CM

## 2023-07-19 DIAGNOSIS — R42 Dizziness and giddiness: Secondary | ICD-10-CM

## 2023-07-19 DIAGNOSIS — R252 Cramp and spasm: Secondary | ICD-10-CM

## 2023-07-26 ENCOUNTER — Other Ambulatory Visit: Payer: Medicare Other

## 2023-07-26 ENCOUNTER — Other Ambulatory Visit (INDEPENDENT_AMBULATORY_CARE_PROVIDER_SITE_OTHER): Payer: Medicare Other

## 2023-07-26 DIAGNOSIS — E559 Vitamin D deficiency, unspecified: Secondary | ICD-10-CM | POA: Diagnosis not present

## 2023-07-26 DIAGNOSIS — R739 Hyperglycemia, unspecified: Secondary | ICD-10-CM | POA: Diagnosis not present

## 2023-07-26 DIAGNOSIS — I10 Essential (primary) hypertension: Secondary | ICD-10-CM

## 2023-07-26 DIAGNOSIS — E782 Mixed hyperlipidemia: Secondary | ICD-10-CM

## 2023-07-26 LAB — CBC WITH DIFFERENTIAL/PLATELET
Basophils Absolute: 0.1 10*3/uL (ref 0.0–0.1)
Basophils Relative: 1 % (ref 0.0–3.0)
Eosinophils Absolute: 0.2 10*3/uL (ref 0.0–0.7)
Eosinophils Relative: 2.4 % (ref 0.0–5.0)
HCT: 45.4 % (ref 36.0–46.0)
Hemoglobin: 15.3 g/dL — ABNORMAL HIGH (ref 12.0–15.0)
Lymphocytes Relative: 25 % (ref 12.0–46.0)
Lymphs Abs: 1.7 10*3/uL (ref 0.7–4.0)
MCHC: 33.7 g/dL (ref 30.0–36.0)
MCV: 102.7 fL — ABNORMAL HIGH (ref 78.0–100.0)
Monocytes Absolute: 0.6 10*3/uL (ref 0.1–1.0)
Monocytes Relative: 8.9 % (ref 3.0–12.0)
Neutro Abs: 4.3 10*3/uL (ref 1.4–7.7)
Neutrophils Relative %: 62.7 % (ref 43.0–77.0)
Platelets: 249 10*3/uL (ref 150.0–400.0)
RBC: 4.42 Mil/uL (ref 3.87–5.11)
RDW: 14.9 % (ref 11.5–15.5)
WBC: 6.9 10*3/uL (ref 4.0–10.5)

## 2023-07-26 LAB — LIPID PANEL
Cholesterol: 240 mg/dL — ABNORMAL HIGH (ref 0–200)
HDL: 69.4 mg/dL (ref >39.00)
LDL Cholesterol: 144 mg/dL — ABNORMAL HIGH (ref 0–99)
NonHDL: 170.44
Total CHOL/HDL Ratio: 3
Triglycerides: 133 mg/dL (ref 0.0–149.0)
VLDL: 26.6 mg/dL (ref 0.0–40.0)

## 2023-07-26 LAB — COMPREHENSIVE METABOLIC PANEL
ALT: 20 U/L (ref 0–35)
AST: 17 U/L (ref 0–37)
Albumin: 4 g/dL (ref 3.5–5.2)
Alkaline Phosphatase: 81 U/L (ref 39–117)
BUN: 15 mg/dL (ref 6–23)
CO2: 25 meq/L (ref 19–32)
Calcium: 9 mg/dL (ref 8.4–10.5)
Chloride: 104 meq/L (ref 96–112)
Creatinine, Ser: 0.72 mg/dL (ref 0.40–1.20)
GFR: 80.61 mL/min (ref >60.00)
Glucose, Bld: 108 mg/dL — ABNORMAL HIGH (ref 70–99)
Potassium: 4.4 meq/L (ref 3.5–5.1)
Sodium: 136 meq/L (ref 135–145)
Total Bilirubin: 0.8 mg/dL (ref 0.2–1.2)
Total Protein: 6.9 g/dL (ref 6.0–8.3)

## 2023-07-26 LAB — HEMOGLOBIN A1C: Hgb A1c MFr Bld: 6 % (ref 4.6–6.5)

## 2023-07-26 LAB — TSH: TSH: 1.73 u[IU]/mL (ref 0.35–5.50)

## 2023-07-29 LAB — VITAMIN D 1,25 DIHYDROXY
Vitamin D 1, 25 (OH)2 Total: 56 pg/mL (ref 18–72)
Vitamin D2 1, 25 (OH)2: 21 pg/mL
Vitamin D3 1, 25 (OH)2: 35 pg/mL

## 2023-08-02 ENCOUNTER — Ambulatory Visit: Payer: Medicare Other | Attending: Family Medicine | Admitting: Physical Therapy

## 2023-08-02 DIAGNOSIS — R252 Cramp and spasm: Secondary | ICD-10-CM | POA: Insufficient documentation

## 2023-08-02 DIAGNOSIS — R2681 Unsteadiness on feet: Secondary | ICD-10-CM | POA: Diagnosis not present

## 2023-08-02 DIAGNOSIS — R42 Dizziness and giddiness: Secondary | ICD-10-CM | POA: Insufficient documentation

## 2023-08-02 NOTE — Therapy (Signed)
OUTPATIENT PHYSICAL THERAPY VESTIBULAR TREATMENT  Patient Name: Renee [name] MRN: [id number] DOB:03-Mar-1946, 77 y.o., female Today's Date: 08/02/2023  END OF SESSION:  PT End of Session - 08/02/23 1514     Visit Number 4    Date for PT Re-Evaluation 09/07/23    Authorization Type [employer]    Progress Note Due on Visit 10    PT Start Time 1515    PT Stop Time 1555    PT Time Calculation (min) 40 min    Activity Tolerance Patient tolerated treatment well    Behavior During Therapy [facility] for tasks assessed/performed                Past Medical History:  Diagnosis Date   Abdominal pain 07/05/2017   ABNORMAL INVOLUNTARY MOVEMENTS 01/01/2009   Qualifier: Diagnosis of  By: Alwyn Ren MD, William     Allergic rhinitis 09/19/2008   Qualifier: Diagnosis of  By: Nena Jordan    Allergy    Arthralgia of right wrist 05/06/2020   Atypical chest pain 09/19/2008   Qualifier: Diagnosis of  By: Nena Jordan    Cervical cancer screening 05/08/2013   Menarche 11, regular Menopause 50s G2P2 s/p 2 svd No abnormal paps or MGM Fatty cyst on labia removed.    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   Qualifier: Diagnosis of  By: Nena Jordan    Dermatitis 09/17/2018   Diverticulosis 05/06/2017   Essential hypertension 09/19/2008   Qualifier: Diagnosis of  By: Nena Jordan    GERD (gastroesophageal reflux disease)    H/O osteopenia 02/08/2016   H/O tobacco use, presenting hazards to health 09/19/2008   Qualifier: Diagnosis of  By: Nena Jordan Smoking roughly 5 cig daily  Last cigarette was in December of 2016   Head tremor 12/27/2016   Hearing loss in right ear 09/19/2008   Qualifier: Diagnosis of  By: Nena Jordan    Hx of adenomatous colonic polyps 01/27/2018   Hyperglycemia 10/07/2016   Hyperlipidemia, mixed  10/03/2008   Qualifier: Diagnosis of  By: Nena Jordan    Lumbar radiculopathy 07/06/2021   Neuromuscular disorder (HCC)    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 Fixed sigmoid colon prevents. Referred for surgery.   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 02/09/2017   Tinnitus 12/12/2017   Tremor, essential    Vertigo    Vertigo    Past Surgical History:  Procedure Laterality Date   INNER EAR SURGERY  2015   with tube   MOUTH SURGERY  01/22/11   lower denture secured by titanium bolts   rectal suction biospy     XI ROBOTIC ASSISTED LOWER ANTERIOR RESECTION N/A 02/09/2017  Procedure: XI ROBOTIC RESECTION OF SIGMOID COLON, RIGID PROCTOSCOPY;  Surgeon: Karie Soda, MD;  Location: [facility];  Service: General;  Laterality: N/A;   Patient Active Problem List   Diagnosis Date Noted   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   Unsteady gait 05/11/2023   Myalgia 02/02/2023   Vitamin D deficiency 07/29/2022   Lumbar radiculopathy 07/06/2021   Osteopenia 05/14/2021   Change in bowel habits 05/14/2021   Plantar wart of both feet 10/26/2020   Dizziness    Tremor, essential    Neuromuscular disorder (HCC)    Allergy    Arthralgia of right wrist 05/06/2020   Strain of gluteus medius of right lower extremity 05/06/2020   Right hip pain 04/28/2020   Right chronic serous otitis media 05/21/2019   Dermatitis 09/17/2018   Shoulder pain 06/29/2018   Hx of adenomatous  colonic polyps 01/27/2018   Tinnitus 12/12/2017   Rib pain on left side 11/17/2017   Abdominal pain 07/05/2017   PVC's (premature ventricular contractions) 05/10/2017   Diverticulosis 05/06/2017   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 02/09/2017   Head tremor 12/27/2016   Polyp of sigmoid colon 12/08/2016   Hyperglycemia 10/07/2016   Obesity 07/06/2016   GERD (gastroesophageal reflux disease) 07/06/2016   H/O osteopenia 02/08/2016   Cervical pain (neck) 10/07/2015   Preventative health care 05/12/2013   Cervical cancer screening 05/08/2013   Abnormal involuntary movement 01/01/2009   Hyperlipidemia, mixed 10/03/2008   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   Essential hypertension 09/19/2008   Allergic rhinitis 09/19/2008   Atypical chest pain 09/19/2008   Hearing loss in right ear 09/19/2008   CHEST PAIN 09/19/2008    PCP: [name], MD REFERRING PROVIDER: [name], MD  REFERRING DIAG:  R42 (ICD-10-CM) - Dizziness  R26.81 (ICD-10-CM) - Unsteady gait    THERAPY DIAG:  Dizziness and giddiness  Unsteadiness on feet  Cramp and spasm  ONSET DATE: 05/10/23  Rationale for Evaluation and Treatment: Rehabilitation  SUBJECTIVE:   SUBJECTIVE STATEMENT: Pt states she had a med change with her statin and thinks this may be adding to her soreness. Has been able to keep up with her exercises. Pt states dizziness has been pretty good but not yesterday. Pt states her dizziness is "different" but better. Feels less tense.   From eval: Patient reports chronic head tremor, had PT for previously. She has always had some dizziness, but it has gotten worse recently, continuous. She recently obtained hearing aides and saw a neurologist who diagnosed her with cervical dystonia. She saw an ophthalmologist yesterday and her vision is normal. Pt accompanied by: self  PERTINENT HISTORY:  Per referring Dr note: Chronic Dizziness Persistent, non-vertiginous  dizziness, worse with ambulation. No clear pattern or associated symptoms. No recent head trauma. Possible multifactorial etiology including cervical dystonia and hearing loss. -Order MRI to rule out central causes. -Refer back to neurology for further evaluation. -Consider physical therapy for unsteady gait and dizziness.   Cervical Dystonia Chronic condition contributing to dizziness and muscle tension. Patient reluctant to try Botox therapy. -Discussed potential benefits of Botox therapy for symptom relief and prevention of further spinal damage. Patient to consider.   Hearing Loss Patient in process of getting hearing aids. Potential contributor to dizziness. -Encourage continuation of audiology follow-up and hearing aid trial.  PAIN:  Are you having pain? Yes: NPRS scale: 3/10 Pain location: R neck Pain description: stiffness Aggravating factors: Nothing identified Relieving factors: Tylenol  PRECAUTIONS: Fall  RED FLAGS: None  WEIGHT BEARING RESTRICTIONS: No  FALLS: Has patient fallen in last 6 months? No  LIVING ENVIRONMENT: Lives with: lives with their family and lives with their spouse Lives in: House/apartment Stairs: Yes: Internal: 14 steps; on right going up Has following equipment at home: Single point cane  PLOF: Independent, likes to walk, but limited due to fear of falling, Tai-Chi 3 x a week.  PATIENT GOALS: Patient would like to identify the cause of the dizziness and treat it.  OBJECTIVE:  Note: Objective measures were completed at Evaluation unless otherwise noted.  DIAGNOSTIC FINDINGS: N/A  COGNITION: Overall cognitive status: Within functional limits for tasks assessed   SENSATION: Denies changes  EDEMA:  Denies edema  MUSCLE TONE:  WNL  POSTURE:  rounded shoulders and cervical tremor with neck held in mild rotation to R and R lat flex  Cervical ROM:    Active A/PROM (deg) eval  Flexion WFL  Extension 25%  Right lateral flexion  25%  Left lateral flexion 25%  Right rotation 30%  Left rotation 30%  (Blank rows = not tested)  Spurling's (-)  LOWER EXTREMITY MMT: WNL BLE  FUNCTIONAL MOBILITY: I/MI with all bed mobility, transfers, ambulates up and down steps with U rail, pausing every 4 steps  GAIT: Gait pattern: step through pattern, decreased stride length, shuffling, and ataxic Distance walked: In clinic distances Assistive device utilized: None Level of assistance: Modified independence Comments: Patient reports she is fearful of walking outdoors due to fear of falling.  FUNCTIONAL TESTS:  Functional gait assessment: 16/28, increased fall risk MCTSIB: condition 1: 30 sec, condition 2: 30 sec; condition 3: 25 sec, condition 4: 30 sec  VESTIBULAR ASSESSMENT:  GENERAL OBSERVATION: chronic cervical tremors with head maintained in R rotaiton and lat flex   SYMPTOM BEHAVIOR:  Subjective history: Had ear surgery, R ear to remove a tumor, she had possible Meningitis as a child with temporary R sided weakness  Non-Vestibular symptoms:  Recently diagnosed with cervical dystonia, cervical tremors  Type of dizziness: Imbalance (Disequilibrium), Lightheadedness/Faint, and stepping backwards seems more challenging sometimes.  Frequency: constant  Duration: constant  Aggravating factors: No known aggravating factors  Relieving factors: lying supine  Progression of symptoms: worse  OCULOMOTOR EXAM:  Ocular Alignment: normal  Ocular ROM: No Limitations  Spontaneous Nystagmus: absent  Gaze-Induced Nystagmus: absent  Smooth Pursuits: intact  Saccades: intact  Convergence/Divergence: 10 cm   VESTIBULAR - OCULAR REFLEX:   Slow VOR: Normal  VOR Cancellation: Comment: Reported a slight blurriness when moving to the R  Head-Impulse Test: HIT Right: negative HIT Left: negative    POSITIONAL TESTING:  Dix-Hallpike Right: (-) Dix-Hallpike Left: (-) Sidelying Right and Left (-)  FUNCTIONAL GAIT: Functional gait  assessment: 16/28   VESTIBULAR TREATMENT:                                                                                                   DATE:  08/02/23 UBE L1; 2 min fwd, 2 min bwd Diaphragmatic breath (inhale 4, hold 7, exhale 8) x10 to relax neck muscles UT stretch x 30 sec R&L  Feet together on airex EC 2x30" Feet together on airex EC head ext x30" Feet together on airex arm elevation EO and then EC x10 each Isometrics L cervical rotation 5x5" Backwards/forwards rocking with foot on blue side of bosu 2x10 for backwards stepping Staggered standing with one foot on blue side of bosu 2x30" Fwd/bwds walking against resistance 10# on each side  07/19/23 HEP review  UBE L2 x80mins each way  STM to neck and passive stretching- some use of massage gun Chin tucks against resistance red 2x10 VOR x1 and x2 seated 15 reps Walking on beam    07/12/23 Sitting levator stretch x 30 sec R&L Sitting anterior scalene stretch x 30 sec R&L Sitting upper trap stretch x 30 sec R&L Manual therapy: STM & TPR UT, levator, cervical paraspinals See assessment above Sitting thoracic extension x10 Sitting thoracic extension + rotation x10 Feet together VOR x1 horizontal and then vertical x30 sec each (reports increased dizziness with vertical) Feet together VOR cancellation horizontal and then vertical x30 sec each (increased dizziness with vertical)  06/29/23 Education  PATIENT EDUCATION: Education details: POC Person educated: Patient Education method: Explanation Education comprehension: verbalized understanding  HOME EXERCISE PROGRAM:  GOALS: Goals reviewed with patient? Yes  SHORT TERM GOALS: Target date: 07/20/23  I with initial HEP Baseline: Goal status: INITIAL LONG TERM GOALS: Target date: 09/07/23  I with final HEP Baseline:  Goal status: INITIAL  2.  Increase FGA score to at least 24/28 Baseline:  Goal status: INITIAL  3.  Patient will achieve at least 75% of normal  cervical ROM. Baseline:  Goal status: INITIAL  4.  Patient will ambulate at least 500' on paved surfaces I, with no episodes of balance loss Baseline:  Goal status: INITIAL  5.  Patient will report decrease is dizziness/unsteadiness episodes by 50% Baseline: Constant Goal status: INITIAL  ASSESSMENT:  CLINICAL IMPRESSION: Pt is noting less tension and overall improvements. Not sure how much of her joint aches/tightness and dizziness is due to her statins. Worked on dynamic balance and stepping strategies today with good pt tolerance. Concentrated on improving posterior LOBs. Found pt's R LE to be slightly weaker compared to L when performing stabilizing exercises.   From eval: Patient is a 77 y.o. who was seen today for physical therapy evaluation and treatment for recurrent and increasing dizziness and unsteady gait. She was seen 2 years ago for B BPPV and VOR re-education with improved symptoms. She presents with cervical dystonia and tremors tremors and stiffness, constant mild unsteadiness, no dizziness. She reports increased neck and shoulder pain. VOR testing was Neg, but score on FGA indicated increased fall risk. Unable to complete assessment due to the depth of testing required, will complete BPPV and m-CTSIB assessment on next visit further clarify which sensory systems may be contributing to her imbalance, but she will benefit from PT to address her tightness and poor balance to decrease fall risk and dizziness. She has also started to see a Neurologist who may be able to further clarify the causes of her dificits  OBJECTIVE IMPAIRMENTS: Abnormal gait, decreased activity tolerance, decreased balance, decreased coordination, difficulty walking, decreased ROM, decreased strength, increased muscle spasms, postural dysfunction, and pain.   ACTIVITY LIMITATIONS: carrying, lifting, bending, standing, squatting, sleeping, stairs, and locomotion level  PARTICIPATION LIMITATIONS: meal prep,  cleaning, laundry, driving, shopping, and community activity  PERSONAL FACTORS: Past/current experiences are also affecting patient's functional outcome.   REHAB POTENTIAL: Good  CLINICAL DECISION MAKING: Evolving/moderate complexity  EVALUATION  COMPLEXITY: Moderate   PLAN:  PT FREQUENCY: 2x/week  PT DURATION: 10 weeks  PLANNED INTERVENTIONS: 97110-Therapeutic exercises, 97530- Therapeutic activity, [id number]- Neuromuscular re-education, 97535- Self Care, [id number]- Manual therapy, [phone]- Gait training, [phone]- Canalith repositioning, [phone]- Traction (mechanical), Patient/Family education, Balance training, Stair training, Dry Needling, Joint mobilization, Spinal mobilization, Vestibular training, Visual/preceptual remediation/compensation, Cryotherapy, and Moist heat  PLAN FOR NEXT SESSION: Continue balance and neck/postural stretching/strengthening. Standing on EC, EO, Head turns on airex   Abbygayle Helfand April Ma L [place], PT, DPT 08/02/2023, 3:18 PM

## 2023-08-09 ENCOUNTER — Ambulatory Visit: Payer: Medicare Other | Admitting: Physical Therapy

## 2023-08-15 ENCOUNTER — Encounter: Payer: Self-pay | Admitting: Physical Therapy

## 2023-08-15 ENCOUNTER — Ambulatory Visit: Payer: Medicare Other | Admitting: Physical Therapy

## 2023-08-15 DIAGNOSIS — R252 Cramp and spasm: Secondary | ICD-10-CM

## 2023-08-15 DIAGNOSIS — R2681 Unsteadiness on feet: Secondary | ICD-10-CM | POA: Diagnosis not present

## 2023-08-15 DIAGNOSIS — R42 Dizziness and giddiness: Secondary | ICD-10-CM | POA: Diagnosis not present

## 2023-08-15 NOTE — Therapy (Signed)
OUTPATIENT PHYSICAL THERAPY VESTIBULAR TREATMENT  Patient Name: Renee [name] MRN: [id number] DOB:06-13-1946, 77 y.o., female Today's Date: 08/15/2023  END OF SESSION:  PT End of Session - 08/15/23 1547     Visit Number 5    Date for PT Re-Evaluation 09/07/23    PT Start Time 1500    PT Stop Time 1540    PT Time Calculation (min) 40 min    Activity Tolerance Patient tolerated treatment well    Behavior During Therapy [facility] for tasks assessed/performed                 Past Medical History:  Diagnosis Date   Abdominal pain 07/05/2017   ABNORMAL INVOLUNTARY MOVEMENTS 01/01/2009   Qualifier: Diagnosis of  By: Alwyn Ren MD, William     Allergic rhinitis 09/19/2008   Qualifier: Diagnosis of  By: Nena Jordan    Allergy    Arthralgia of right wrist 05/06/2020   Atypical chest pain 09/19/2008   Qualifier: Diagnosis of  By: Nena Jordan    Cervical cancer screening 05/08/2013   Menarche 11, regular Menopause 34s G2P2 s/p 2 svd No abnormal paps or MGM Fatty cyst on labia removed.    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   Qualifier: Diagnosis of  By: Nena Jordan    Dermatitis 09/17/2018   Diverticulosis 05/06/2017   Essential hypertension 09/19/2008   Qualifier: Diagnosis of  By: Nena Jordan    GERD (gastroesophageal reflux disease)    H/O osteopenia 02/08/2016   H/O tobacco use, presenting hazards to health 09/19/2008   Qualifier: Diagnosis of  By: Nena Jordan Smoking roughly 5 cig daily  Last cigarette was in December of 2016   Head tremor 12/27/2016   Hearing loss in right ear 09/19/2008   Qualifier: Diagnosis of  By: Nena Jordan    Hx of adenomatous colonic polyps 01/27/2018   Hyperglycemia 10/07/2016   Hyperlipidemia, mixed 10/03/2008   Qualifier: Diagnosis of  By: Nena Jordan     Lumbar radiculopathy 07/06/2021   Neuromuscular disorder (HCC)    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 Fixed sigmoid colon prevents. Referred for surgery.   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 02/09/2017   Tinnitus 12/12/2017   Tremor, essential    Vertigo    Vertigo    Past Surgical History:  Procedure Laterality Date   INNER EAR SURGERY  2015   with tube   MOUTH SURGERY  01/22/11   lower denture secured by titanium bolts   rectal suction biospy     XI ROBOTIC ASSISTED LOWER ANTERIOR RESECTION N/A 02/09/2017   Procedure: XI ROBOTIC RESECTION OF SIGMOID COLON, RIGID PROCTOSCOPY;  Surgeon: Karie Soda, MD;  Location: [facility];  Service: General;  Laterality: N/A;   Patient Active Problem List   Diagnosis Date Noted   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   Unsteady gait 05/11/2023   Myalgia 02/02/2023   Vitamin D deficiency 07/29/2022   Lumbar radiculopathy 07/06/2021   Osteopenia 05/14/2021   Change in bowel habits 05/14/2021   Plantar wart of both feet 10/26/2020   Dizziness    Tremor, essential    Neuromuscular disorder (HCC)    Allergy    Arthralgia of right wrist 05/06/2020   Strain of gluteus medius of right lower extremity 05/06/2020   Right hip pain 04/28/2020   Right chronic serous otitis media 05/21/2019   Dermatitis 09/17/2018   Shoulder pain 06/29/2018   Hx of adenomatous colonic polyps 01/27/2018   Tinnitus 12/12/2017   Rib pain on  left side 11/17/2017   Abdominal pain 07/05/2017   PVC's (premature ventricular contractions) 05/10/2017   Diverticulosis 05/06/2017   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 02/09/2017   Head tremor 12/27/2016   Polyp of sigmoid colon 12/08/2016   Hyperglycemia 10/07/2016   Obesity 07/06/2016   GERD (gastroesophageal reflux disease) 07/06/2016   H/O osteopenia 02/08/2016   Cervical pain (neck) 10/07/2015   Preventative health care 05/12/2013   Cervical cancer screening 05/08/2013   Abnormal involuntary movement 01/01/2009   Hyperlipidemia, mixed 10/03/2008   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   Essential hypertension 09/19/2008   Allergic rhinitis 09/19/2008   Atypical chest pain 09/19/2008   Hearing loss in right ear 09/19/2008   CHEST PAIN 09/19/2008    PCP: [name], MD REFERRING PROVIDER: [name], MD  REFERRING DIAG:  R42 (ICD-10-CM) - Dizziness  R26.81 (ICD-10-CM) - Unsteady gait    THERAPY DIAG:  Dizziness and giddiness  Cramp and spasm  Unsteadiness on feet  ONSET DATE: 05/10/23  Rationale for Evaluation and Treatment: Rehabilitation  SUBJECTIVE:   SUBJECTIVE STATEMENT: Pt states that her pain has returned. Seems to be connected to her HEP. She feels stretching during the exercises and increased pain the next day. Has a Dr appointment on 1/7 to follow up regarding the Statins then, so these may still be contributing. She is also having her carotid artery blood flow assessed. She still as dizziness, but feels more stable walking outside.  From eval: Patient reports chronic head tremor, had PT for previously. She has always had some dizziness, but it has gotten worse recently, continuous. She recently obtained hearing aides and saw a neurologist who diagnosed her with cervical dystonia. She saw an ophthalmologist yesterday and her vision is normal. Pt accompanied by: self  PERTINENT HISTORY:  Per referring Dr note: Chronic  Dizziness Persistent, non-vertiginous dizziness, worse with ambulation. No clear pattern or associated symptoms. No recent head trauma. Possible multifactorial etiology including cervical dystonia and hearing loss. -Order MRI to rule out central causes. -Refer back to neurology for further evaluation. -Consider physical therapy for unsteady gait and dizziness.   Cervical Dystonia Chronic condition contributing to dizziness and muscle tension. Patient reluctant to try Botox therapy. -Discussed potential benefits of Botox therapy for symptom relief and prevention of further spinal damage. Patient to consider.   Hearing Loss Patient in process of getting hearing aids. Potential contributor to dizziness. -Encourage continuation of audiology follow-up and hearing aid trial.  PAIN:  Are you having pain? Yes: NPRS scale: 3/10 Pain location: R neck Pain description: stiffness Aggravating factors: Nothing identified Relieving factors: Tylenol  PRECAUTIONS: Fall  RED FLAGS: None  WEIGHT BEARING RESTRICTIONS: No  FALLS: Has patient fallen in last 6 months? No  LIVING ENVIRONMENT: Lives with: lives with their family and lives with their spouse Lives in: House/apartment Stairs: Yes: Internal: 14 steps; on right going up Has following equipment at home: Single point cane  PLOF: Independent, likes to walk, but limited due to fear of falling, Tai-Chi 3 x a week.  PATIENT GOALS: Patient would like to identify the cause of the dizziness and treat it.  OBJECTIVE:  Note: Objective measures were completed at Evaluation unless otherwise noted.  DIAGNOSTIC FINDINGS: N/A  COGNITION: Overall cognitive status: Within functional limits for tasks assessed   SENSATION: Denies changes  EDEMA:  Denies edema  MUSCLE TONE:  WNL  POSTURE:  rounded shoulders and cervical tremor with neck held in mild rotation to R and R lat flex  Cervical ROM:    Active A/PROM (deg) eval  Flexion WFL   Extension 25%  Right lateral flexion 25%  Left lateral flexion 25%  Right rotation 30%  Left rotation 30%  (Blank rows = not tested)  Spurling's (-)  LOWER EXTREMITY MMT: WNL BLE  FUNCTIONAL MOBILITY: I/MI with all bed mobility, transfers, ambulates up and down steps with U rail, pausing every 4 steps  GAIT: Gait pattern: step through pattern, decreased stride length, shuffling, and ataxic Distance walked: In clinic distances Assistive device utilized: None Level of assistance: Modified independence Comments: Patient reports she is fearful of walking outdoors due to fear of falling.  FUNCTIONAL TESTS:  Functional gait assessment: 16/28, increased fall risk MCTSIB: condition 1: 30 sec, condition 2: 30 sec; condition 3: 25 sec, condition 4: 30 sec  VESTIBULAR ASSESSMENT:  GENERAL OBSERVATION: chronic cervical tremors with head maintained in R rotaiton and lat flex   SYMPTOM BEHAVIOR:  Subjective history: Had ear surgery, R ear to remove a tumor, she had possible Meningitis as a child with temporary R sided weakness  Non-Vestibular symptoms:  Recently diagnosed with cervical dystonia, cervical tremors  Type of dizziness: Imbalance (Disequilibrium), Lightheadedness/Faint, and stepping backwards seems more challenging sometimes.  Frequency: constant  Duration: constant  Aggravating factors: No known aggravating factors  Relieving factors: lying supine  Progression of symptoms: worse  OCULOMOTOR EXAM:  Ocular Alignment: normal  Ocular ROM: No Limitations  Spontaneous Nystagmus: absent  Gaze-Induced Nystagmus: absent  Smooth Pursuits: intact  Saccades: intact  Convergence/Divergence: 10 cm   VESTIBULAR - OCULAR REFLEX:   Slow VOR: Normal  VOR Cancellation: Comment: Reported a slight blurriness when moving to the R  Head-Impulse Test: HIT Right: negative HIT Left: negative    POSITIONAL TESTING:  Dix-Hallpike Right: (-) Dix-Hallpike Left: (-) Sidelying Right and Left  (-)  FUNCTIONAL GAIT: Functional gait assessment: 16/28   VESTIBULAR TREATMENT:                                                                                                   DATE:  08/15/23 NuStep L5 x 6 min In parallel bars, standing on airex with normal BOS, then narrow BOS, performed weight shifts ant/post, the lat, no UE support Moved  to tandem stance on airex plank with weight shifts, she notes RLE feels weaker, but better balance overall noted. Standing in parallel bars, step back  with RLE and rotate to R, reaching back to touch wall directly behind. Performed easily. Repeat while standing on airex pad. Slightly more difficulty, especially in R stance, but no LOB. Supine STM to B cerv paraspinals, UT, LS, emphasizing R side as this was more tight. Seated stretch to UT  08/02/23 UBE L1; 2 min fwd, 2 min bwd Diaphragmatic breath (inhale 4, hold 7, exhale 8) x10 to relax neck muscles UT stretch x 30 sec R&L Feet together on airex EC 2x30" Feet together on airex EC head ext x30" Feet together on airex arm elevation EO and then EC x10 each Isometrics L cervical rotation 5x5" Backwards/forwards rocking with foot on blue side of bosu 2x10 for backwards stepping Staggered standing with one foot on blue side of bosu 2x30" Fwd/bwds walking against resistance 10# on each side  07/19/23 HEP review  UBE L2 x62mins each way  STM to neck and passive stretching- some use of massage gun Chin tucks against resistance red 2x10 VOR x1 and x2 seated 15 reps Walking on beam    07/12/23 Sitting levator stretch x 30 sec R&L Sitting anterior scalene stretch x 30 sec R&L Sitting upper trap stretch x 30 sec R&L Manual therapy: STM & TPR UT, levator, cervical paraspinals See assessment above Sitting thoracic extension x10 Sitting thoracic extension + rotation x10 Feet together VOR x1 horizontal and then vertical x30 sec each (reports increased dizziness with vertical) Feet together VOR  cancellation horizontal and then vertical x30 sec each (increased dizziness with vertical)  06/29/23 Education  PATIENT EDUCATION: Education details: POC Person educated: Patient Education method: Explanation Education comprehension: verbalized understanding  HOME EXERCISE PROGRAM:  GOALS: Goals reviewed with patient? Yes  SHORT TERM GOALS: Target date: 07/20/23  I with initial HEP Baseline: Goal status: 08/15/23-Has HEP, met LONG TERM GOALS: Target date: 09/07/23  I with final HEP Baseline:  Goal status: INITIAL  2.  Increase FGA score to at least 24/28 Baseline:  Goal status: INITIAL  3.  Patient will achieve at least 75% of normal cervical ROM. Baseline:  Goal status: INITIAL  4.  Patient will ambulate at least 500' on paved surfaces I, with no episodes of balance loss Baseline:  Goal status: INITIAL  5.  Patient will report decrease is dizziness/unsteadiness episodes by 50% Baseline: Constant Goal status: 08/15/23- Dizziness improved, but still progressing  ASSESSMENT:  CLINICAL IMPRESSION: Pt is noting less tension and overall improvements. Not sure how much of her joint aches/tightness and dizziness is due to her statins. Worked on dynamic balance and stepping strategies today with good pt tolerance. Concentrated on improving posterior LOBs. Found pt's R LE to be slightly weaker compared to L when performing stabilizing exercises.   From eval: Patient is a 77 y.o. who was seen today for physical therapy evaluation and treatment for recurrent and increasing dizziness and unsteady gait. She was seen 2 years ago for B BPPV and VOR re-education with improved symptoms. She presents with cervical dystonia and tremors tremors and stiffness, constant mild unsteadiness, no dizziness. She reports increased neck and shoulder pain. VOR testing was Neg, but score on FGA indicated increased fall risk. Unable to complete assessment due to the depth of testing required, will  complete BPPV and m-CTSIB assessment on next visit further clarify which sensory systems may be contributing to her imbalance, but  she will benefit from PT to address her tightness and poor balance to decrease fall risk and dizziness. She has also started to see a Neurologist who may be able to further clarify the causes of her dificits  OBJECTIVE IMPAIRMENTS: Abnormal gait, decreased activity tolerance, decreased balance, decreased coordination, difficulty walking, decreased ROM, decreased strength, increased muscle spasms, postural dysfunction, and pain.   ACTIVITY LIMITATIONS: carrying, lifting, bending, standing, squatting, sleeping, stairs, and locomotion level  PARTICIPATION LIMITATIONS: meal prep, cleaning, laundry, driving, shopping, and community activity  PERSONAL FACTORS: Past/current experiences are also affecting patient's functional outcome.   REHAB POTENTIAL: Good  CLINICAL DECISION MAKING: Evolving/moderate complexity  EVALUATION COMPLEXITY: Moderate   PLAN:  PT FREQUENCY: 2x/week  PT DURATION: 10 weeks  PLANNED INTERVENTIONS: 97110-Therapeutic exercises, 97530- Therapeutic activity, [id number]- Neuromuscular re-education, 97535- Self Care, [id number]- Manual therapy, [id number]- Gait training, [phone]- Canalith repositioning, [phone]- Traction (mechanical), Patient/Family education, Balance training, Stair training, Dry Needling, Joint mobilization, Spinal mobilization, Vestibular training, Visual/preceptual remediation/compensation, Cryotherapy, and Moist heat  PLAN FOR NEXT SESSION: Continue balance and neck/postural stretching/strengthening. Standing on EC, EO, Head turns on airex   Iona Beard, PT, DPT 08/15/2023, 3:48 PM

## 2023-08-25 ENCOUNTER — Encounter: Payer: Medicare Other | Admitting: Family Medicine

## 2023-08-29 NOTE — Assessment & Plan Note (Signed)
 Well controlled, no changes to meds. Encouraged heart healthy diet such as the DASH diet and exercise as tolerated.

## 2023-08-29 NOTE — Assessment & Plan Note (Signed)
 Encouraged to get adequate exercise, calcium and vitamin d intake

## 2023-08-29 NOTE — Assessment & Plan Note (Signed)
Patient encouraged to maintain heart healthy diet, regular exercise, adequate sleep. Consider daily probiotics. Take medications as prescribed Labs ordered and reviewed  Colonoscopy 2019 repeat in 2024 MGM 2022 repeat this year.  Pap aged out Dexa 12/22 repeat in 2 years RSV (respiratory syncitial virus) vaccine at pharmacy, [place] booster at pharmacy High dose flu shot  Shingrix is the new shingles shot, 2 shots over 2-6 months, confirm coverage with insurance and document, then can return here for shots with nurse appt or at pharmacy

## 2023-08-29 NOTE — Assessment & Plan Note (Signed)
 [id number] acceptable, minimize simple carbs. Increase exercise as tolerated.

## 2023-08-29 NOTE — Assessment & Plan Note (Signed)
 Encourage heart healthy diet such as MIND or DASH diet, increase exercise, avoid trans fats, simple carbohydrates and processed foods, consider a krill or fish or flaxseed oil cap daily.

## 2023-08-30 ENCOUNTER — Ambulatory Visit (HOSPITAL_BASED_OUTPATIENT_CLINIC_OR_DEPARTMENT_OTHER)
Admission: RE | Admit: 2023-08-30 | Discharge: 2023-08-30 | Disposition: A | Discharge status: Home / Self Care | Payer: Medicare Other | Source: Ambulatory Visit | Attending: Neurology | Admitting: Neurology

## 2023-08-30 ENCOUNTER — Encounter: Payer: Self-pay | Admitting: Family Medicine

## 2023-08-30 ENCOUNTER — Ambulatory Visit (INDEPENDENT_AMBULATORY_CARE_PROVIDER_SITE_OTHER): Payer: Medicare Other | Admitting: Family Medicine

## 2023-08-30 VITALS — BP 142/86 | HR 82 | Temp 98.1°F | Resp 18 | Ht 66.0 in | Wt 223.8 lb

## 2023-08-30 DIAGNOSIS — R42 Dizziness and giddiness: Secondary | ICD-10-CM | POA: Diagnosis not present

## 2023-08-30 DIAGNOSIS — I10 Essential (primary) hypertension: Secondary | ICD-10-CM

## 2023-08-30 DIAGNOSIS — R739 Hyperglycemia, unspecified: Secondary | ICD-10-CM

## 2023-08-30 DIAGNOSIS — Z780 Asymptomatic menopausal state: Secondary | ICD-10-CM | POA: Diagnosis not present

## 2023-08-30 DIAGNOSIS — E782 Mixed hyperlipidemia: Secondary | ICD-10-CM

## 2023-08-30 DIAGNOSIS — M8580 Other specified disorders of bone density and structure, unspecified site: Secondary | ICD-10-CM

## 2023-08-30 DIAGNOSIS — E2839 Other primary ovarian failure: Secondary | ICD-10-CM

## 2023-08-30 DIAGNOSIS — Z1231 Encounter for screening mammogram for malignant neoplasm of breast: Secondary | ICD-10-CM

## 2023-08-30 DIAGNOSIS — Z0000 Encounter for general adult medical examination without abnormal findings: Secondary | ICD-10-CM | POA: Diagnosis not present

## 2023-08-30 DIAGNOSIS — I6523 Occlusion and stenosis of bilateral carotid arteries: Secondary | ICD-10-CM | POA: Diagnosis not present

## 2023-08-30 DIAGNOSIS — Z860101 Personal history of adenomatous and serrated colon polyps: Secondary | ICD-10-CM

## 2023-08-30 NOTE — Patient Instructions (Addendum)
 Call Dr Katherleen office for follow up appt per Dr Jerryl note you have an elevated coronary artery calcium  score and he needs to discuss this with you at a visit per his result note  RSV, Respiratory Syncitial Virus Vaccine, Arexvy at pharmacy  Annual flu and covid boosters  Shingrix is the new shingles shot, 2 shots over 2-6 months, confirm coverage with insurance and document, then can return here for shots with nurse appt or at pharmacy   Tetanus shot at pharmacy   Preventive Care 65 Years and Older, Female Preventive care refers to lifestyle choices and visits with your health care provider that can promote health and wellness. Preventive care visits are also called wellness exams. What can I expect for my preventive care visit? Counseling Your health care provider may ask you questions about your: Medical history, including: Past medical problems. Family medical history. Pregnancy and menstrual history. History of falls. Current health, including: Memory and ability to understand (cognition). Emotional well-being. Home life and relationship well-being. Sexual activity and sexual health. Lifestyle, including: Alcohol, nicotine or tobacco, and drug use. Access to firearms. Diet, exercise, and sleep habits. Work and work astronomer. Sunscreen use. Safety issues such as seatbelt and bike helmet use. Physical exam Your health care provider will check your: Height and weight. These may be used to calculate your BMI (body mass index). BMI is a measurement that tells if you are at a healthy weight. Waist circumference. This measures the distance around your waistline. This measurement also tells if you are at a healthy weight and may help predict your risk of certain diseases, such as type 2 diabetes and high blood pressure. Heart rate and blood pressure. Body temperature. Skin for abnormal spots. What immunizations do I need?  Vaccines are usually given at various ages, according to a  schedule. Your health care provider will recommend vaccines for you based on your age, medical history, and lifestyle or other factors, such as travel or where you work. What tests do I need? Screening Your health care provider may recommend screening tests for certain conditions. This may include: Lipid and cholesterol levels. Hepatitis C test. Hepatitis B test. HIV (human immunodeficiency virus) test. STI (sexually transmitted infection) testing, if you are at risk. Lung cancer screening. Colorectal cancer screening. Diabetes screening. This is done by checking your blood sugar (glucose) after you have not eaten for a while (fasting). Mammogram. Talk with your health care provider about how often you should have regular mammograms. BRCA-related cancer screening. This may be done if you have a family history of breast, ovarian, tubal, or peritoneal cancers. Bone density scan. This is done to screen for osteoporosis. Talk with your health care provider about your test results, treatment options, and if necessary, the need for more tests. Follow these instructions at home: Eating and drinking  Eat a diet that includes fresh fruits and vegetables, whole grains, lean protein, and low-fat dairy products. Limit your intake of foods with high amounts of sugar, saturated fats, and salt. Take vitamin and mineral supplements as recommended by your health care provider. Do not drink alcohol if your health care provider tells you not to drink. If you drink alcohol: Limit how much you have to 0-1 drink a day. Know how much alcohol is in your drink. In the [place] one drink equals one 12 oz bottle of beer (355 mL), one 5 oz glass of wine (148 mL), or one 1 oz glass of hard liquor (44 mL). Lifestyle Brush your  teeth every morning and night with fluoride toothpaste. Floss one time each day. Exercise for at least 30 minutes 5 or more days each week. Do not use any products that contain nicotine or  tobacco. These products include cigarettes, chewing tobacco, and vaping devices, such as e-cigarettes. If you need help quitting, ask your health care provider. Do not use drugs. If you are sexually active, practice safe sex. Use a condom or other form of protection in order to prevent STIs. Take aspirin  only as told by your health care provider. Make sure that you understand how much to take and what form to take. Work with your health care provider to find out whether it is safe and beneficial for you to take aspirin  daily. Ask your health care provider if you need to take a cholesterol-lowering medicine (statin). Find healthy ways to manage stress, such as: Meditation, yoga, or listening to music. Journaling. Talking to a trusted person. Spending time with friends and family. Minimize exposure to UV radiation to reduce your risk of skin cancer. Safety Always wear your seat belt while driving or riding in a vehicle. Do not drive: If you have been drinking alcohol. Do not ride with someone who has been drinking. When you are tired or distracted. While texting. If you have been using any mind-altering substances or drugs. Wear a helmet and other protective equipment during sports activities. If you have firearms in your house, make sure you follow all gun safety procedures. What's next? Visit your health care provider once a year for an annual wellness visit. Ask your health care provider how often you should have your eyes and teeth checked. Stay up to date on all vaccines. This information is not intended to replace advice given to you by your health care provider. Make sure you discuss any questions you have with your health care provider. Document Revised: 02/04/2021 Document Reviewed: 02/04/2021 Elsevier Patient Education  2024 [employer].

## 2023-08-30 NOTE — Progress Notes (Signed)
 Subjective:    Patient ID: Renee [name], female    DOB: [birth year], 78 y.o.   MRN: [id number]  Chief Complaint  Patient presents with  . Annual Exam    HPI Discussed the use of AI scribe software for clinical note transcription with the patient, who gave verbal consent to proceed.  History of Present Illness   The patient, with a history of hyperlipidemia, presents with concerns about potential side effects from their statin medication, specifically atorvastatin . They report experiencing aching and fatigue, which they are unsure if it is related to the medication or other factors. The patient has tried reducing the frequency of the medication to every three days and reports a slight improvement in their energy levels.  In addition to this, the patient has been experiencing neck pain and has noticed a difference in strength between their right and left leg, with the right leg feeling weaker. They have been attending physical therapy for balance issues and have been given exercises to do at home.  The patient also mentions a history of smoking, having quit approximately ten years ago. They recently had a CT coronary calcium  score done, which was found to be elevated. The patient is due for a follow-up appointment to discuss these results.        Past Medical History:  Diagnosis Date  . Abdominal pain 07/05/2017  . ABNORMAL INVOLUNTARY MOVEMENTS 01/01/2009   Qualifier: Diagnosis of  By: Tish MD, Elsie    . Allergic rhinitis 09/19/2008   Qualifier: Diagnosis of  By: Georgian ROSALEA CHARM Lamar   . Allergy   . Arthralgia of right wrist 05/06/2020  . Atypical chest pain 09/19/2008   Qualifier: Diagnosis of  By: Georgian ROSALEA CHARM Lamar   . Cervical cancer screening 05/08/2013   Menarche 11, regular Menopause 50s G2P2 s/p 2 svd No abnormal paps or MGM Fatty cyst on labia removed.   . Cervical pain (neck) 10/07/2015  . Change in bowel habits 05/14/2021  . CHEST PAIN 09/19/2008   Qualifier:  Diagnosis of  By: Georgian ROSALEA CHARM Lamar   . 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 Dermatitis 09/17/2018  . Diverticulosis 05/06/2017  . Essential hypertension 09/19/2008   Qualifier: Diagnosis of  By: Georgian ROSALEA CHARM Lamar   . GERD (gastroesophageal reflux disease)   . H/O osteopenia 02/08/2016  . H/O tobacco use, presenting hazards to health 09/19/2008   Qualifier: Diagnosis of  By: Georgian ROSALEA CHARM Lamar Smoking roughly 5 cig daily  Last cigarette was in December of 2016  . Head tremor 12/27/2016  . Hearing loss in right ear 09/19/2008   Qualifier: Diagnosis of  By: Georgian ROSALEA CHARM Lamar   . Hx of adenomatous colonic polyps 01/27/2018  . Hyperglycemia 10/07/2016  . Hyperlipidemia, mixed 10/03/2008   Qualifier: Diagnosis of  By: Georgian ROSALEA CHARM Lamar   . Lumbar radiculopathy 07/06/2021  . 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 Obesity 07/06/2016  . Osteopenia 05/14/2021  . Plantar wart of both feet 10/26/2020  . Polyp of sigmoid colon 12/08/2016   11/2016 - Unable to remove or get bx at colonoscopy. Fixed sigmoid colon prevents. Referred for surgery.  . 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 Preventative health care 05/12/2013  . PVC's (premature ventricular contractions) 05/10/2017  . Rib pain on left side 11/17/2017  . Right chronic serous  otitis media 05/21/2019  . Right hip pain 04/28/2020  . Shoulder pain 06/29/2018  . Strain of gluteus medius of right lower extremity 05/06/2020  . 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 Tinnitus 12/12/2017  . Tremor, essential   . Vertigo   . Vertigo     Past Surgical History:  Procedure Laterality Date  . INNER EAR SURGERY  2015   with tube  . MOUTH SURGERY  01/22/11   lower denture secured by titanium bolts  . rectal suction biospy    . XI ROBOTIC ASSISTED LOWER ANTERIOR RESECTION N/A 02/09/2017   Procedure: XI ROBOTIC  RESECTION OF SIGMOID COLON, RIGID PROCTOSCOPY;  Surgeon: Sheldon Standing, MD;  Location: [facility];  Service: General;  Laterality: N/A;    Family History  Problem Relation Age of Onset  . Stroke Mother   . Hypertension Mother   . Alcohol abuse Mother   . Tremor Mother   . Heart attack Father 37       deceased  . Diabetes Daughter   . Obesity Daughter   . Heart disease Paternal Grandmother        AAA rupture  . Heart disease Paternal Grandfather        MI  . Colon cancer Neg Hx   . Breast cancer Neg Hx   . Prostate cancer Neg Hx   . Liver cancer Neg Hx   . Rectal cancer Neg Hx   . Stomach cancer Neg Hx     Social History   Socioeconomic History  . Marital status: Married    Spouse name: Lamar  . Number of children: 2  . Years of education: Not on file  . Highest education level: Associate degree: occupational, scientist, product/process development, or vocational program  Occupational History  . Occupation: retired    Comment: photographer  Tobacco Use  . Smoking status: Former    Current packs/day: 0.00    Average packs/day: 0.5 packs/day for 53.0 years (26.5 ttl pk-yrs)    Types: Cigarettes    Start date: 59    Quit date: 09/07/2015    Years since quitting: 7.9  . Smokeless tobacco: Never  Vaping Use  . Vaping status: Never Used  Substance and Sexual Activity  . Alcohol use: Yes    Alcohol/week: 14.0 standard drinks of alcohol    Types: 14 Glasses of wine per week    Comment: 2 glasses wine/day  . Drug use: No  . Sexual activity: Yes  Other Topics Concern  . Not on file  Social History Narrative   Married, retired   2 children   Former smoker no drug use, does drink alcohol   Right handed   Social Drivers of Corporate Investment Banker Strain: Low Risk  (03/08/2023)   Overall Financial Resource Strain (CARDIA)   . Difficulty of Paying Living Expenses: Not hard at all  Food Insecurity: No Food Insecurity (03/08/2023)   Hunger Vital Sign   . Worried About Programme Researcher, Broadcasting/film/video in the Last  Year: Never true   . Ran Out of Food in the Last Year: Never true  Transportation Needs: No Transportation Needs (03/08/2023)   PRAPARE - Transportation   . Lack of Transportation (Medical): No   . Lack of Transportation (Non-Medical): No  Physical Activity: Sufficiently Active (03/08/2023)   Exercise Vital Sign   . Days of Exercise per Week: 3 days   . Minutes of Exercise per Session: 60 min  Stress: No Stress Concern  Present (03/08/2023)   [employer] of Occupational Health - Occupational Stress Questionnaire   . Feeling of Stress : Only a little  Recent Concern: Stress - Stress Concern Present (02/01/2023)   [employer] of Occupational Health - Occupational Stress Questionnaire   . Feeling of Stress : To some extent  Social Connections: Socially Integrated (03/08/2023)   Social Connection and Isolation Panel [NHANES]   . Frequency of Communication with Friends and Family: More than three times a week   . Frequency of Social Gatherings with Friends and Family: Three times a week   . Attends Religious Services: More than 4 times per year   . Active Member of Clubs or Organizations: Yes   . Attends Banker Meetings: 1 to 4 times per year   . Marital Status: Married  Catering Manager Violence: Not At Risk (03/09/2023)   Humiliation, Afraid, Rape, and Kick questionnaire   . Fear of Current or Ex-Partner: No   . Emotionally Abused: No   . Physically Abused: No   . Sexually Abused: No    Outpatient Medications Prior to Visit  Medication Sig Dispense Refill  . atorvastatin  (LIPITOR) 10 MG tablet Take 0.5 tablets (5 mg total) by mouth every other day. 30 tablet 3  . b complex vitamins tablet Take 1 tablet by mouth once a week.    . clobetasol cream (TEMOVATE) 0.05 % Apply 1 Application topically 2 (two) times daily as needed (Rash).    . Coenzyme Q10 200 MG capsule Take 200 mg by mouth once a week.     . famotidine  (PEPCID ) 20 MG tablet TAKE 1 TABLET BY MOUTH TWICE  A DAY (Patient taking differently: Take 20 mg by mouth as needed for heartburn or indigestion.) 180 tablet 1  . hydrocortisone  2.5 % cream Apply 1 Application topically 2 (two) times daily.    . metoprolol  succinate (TOPROL -XL) 25 MG 24 hr tablet Take 0.5 tablets (12.5 mg total) by mouth daily. Take an extra metoprolol  daily as needed. 90 tablet 2  . Multiple Vitamin (MULTIVITAMIN WITH MINERALS) TABS tablet Take 1 tablet by mouth every other day.    . triamcinolone cream (KENALOG) 0.1 % Apply 1 Application topically 2 (two) times daily.    . Vitamin D , Ergocalciferol , (DRISDOL ) 1.25 MG (50000 UNIT) CAPS capsule Take 1 capsule (50,000 Units total) by mouth every 7 (seven) days. 12 capsule 1  . fluticasone  (FLONASE ) 50 MCG/ACT nasal spray Place 2 sprays into both nostrils daily. (Patient taking differently: Place 2 sprays into both nostrils daily as needed for allergies or rhinitis.) 16 g 0   No facility-administered medications prior to visit.    Allergies  Allergen Reactions  . Amoxicillin     REACTION: TROUBLE BREATHING Has patient had a PCN reaction causing immediate rash, facial/tongue/throat swelling, SOB or lightheadedness with hypotension:Yes Has patient had a PCN reaction causing severe rash involving mucus membranes or skin necrosis:No Has patient had a PCN reaction that required hospitalization:No Has patient had a PCN reaction occurring within the last 10 years:No If all of the above answers are NO, then may proceed with Cephalosporin use.   . Proair  Hfa [Albuterol ] Other (See Comments)    Hypersensitivity, anxious, disoriented  . Penicillins Rash    Has patient had a PCN reaction causing immediate rash, facial/tongue/throat swelling, SOB or lightheadedness with hypotension:unknown Has patient had a PCN reaction causing severe rash involving mucus membranes or skin necrosis:unknown Has patient had a PCN reaction that required hospitalization:no Has  patient had a PCN reaction  occurring within the last 10 years:no If all of the above answers are NO, then may proceed with Cephalosporin use.     Review of Systems  Constitutional:  Positive for malaise/fatigue. Negative for chills and fever.  HENT:  Negative for congestion and hearing loss.   Eyes:  Negative for discharge.  Respiratory:  Negative for cough, sputum production and shortness of breath.   Cardiovascular:  Negative for chest pain, palpitations and leg swelling.  Gastrointestinal:  Negative for abdominal pain, blood in stool, constipation, diarrhea, heartburn, nausea and vomiting.  Genitourinary:  Negative for dysuria, frequency, hematuria and urgency.  Musculoskeletal:  Positive for joint pain and myalgias. Negative for back pain and falls.  Skin:  Negative for rash.  Neurological:  Positive for tremors. Negative for dizziness, sensory change, loss of consciousness, weakness and headaches.  Endo/Heme/Allergies:  Negative for environmental allergies. Does not bruise/bleed easily.  Psychiatric/Behavioral:  Negative for depression and suicidal ideas. The patient is not nervous/anxious and does not have insomnia.        Objective:    Physical Exam Constitutional:      General: She is not in acute distress.    Appearance: Normal appearance. She is not diaphoretic.  HENT:     Head: Normocephalic and atraumatic.     Right Ear: Tympanic membrane, ear canal and external ear normal.     Left Ear: Tympanic membrane, ear canal and external ear normal.     Nose: Nose normal.     Mouth/Throat:     Mouth: Mucous membranes are moist.     Pharynx: Oropharynx is clear. No oropharyngeal exudate.  Eyes:     General: No scleral icterus.       Right eye: No discharge.        Left eye: No discharge.     Conjunctiva/sclera: Conjunctivae normal.     Pupils: Pupils are equal, round, and reactive to light.  Neck:     Thyroid : No thyromegaly.  Cardiovascular:     Rate and Rhythm: Normal rate and regular rhythm.      Heart sounds: Normal heart sounds. No murmur heard. Pulmonary:     Effort: Pulmonary effort is normal. No respiratory distress.     Breath sounds: Normal breath sounds. No wheezing or rales.  Abdominal:     General: Bowel sounds are normal. There is no distension.     Palpations: Abdomen is soft. There is no mass.     Tenderness: There is no abdominal tenderness.  Musculoskeletal:        General: No tenderness. Normal range of motion.     Cervical back: Normal range of motion and neck supple.  Lymphadenopathy:     Cervical: No cervical adenopathy.  Skin:    General: Skin is warm and dry.     Findings: No rash.  Neurological:     General: No focal deficit present.     Mental Status: She is alert and oriented to person, place, and time.     Cranial Nerves: No cranial nerve deficit.     Coordination: Coordination normal.     Deep Tendon Reflexes: Reflexes are normal and symmetric. Reflexes normal.  Psychiatric:        Mood and Affect: Mood normal.        Behavior: Behavior normal.        Thought Content: Thought content normal.        Judgment: Judgment normal.    BP ROLLEN)  142/86 (BP Location: Left Arm, Patient Position: Sitting, Cuff Size: Large)   Pulse 82   Temp 98.1 F (36.7 C) (Oral)   Resp 18   Ht 5' 6 (1.676 m)   Wt 223 lb 12.8 oz (101.5 kg)   SpO2 94%   BMI 36.12 kg/m  Wt Readings from Last 3 Encounters:  08/30/23 223 lb 12.8 oz (101.5 kg)  07/05/23 223 lb 12.8 oz (101.5 kg)  05/10/23 222 lb 4.8 oz (100.8 kg)    Diabetic Foot Exam - Simple   No data filed    Lab Results  Component Value Date   WBC 6.9 07/26/2023   HGB 15.3 (H) 07/26/2023   HCT 45.4 07/26/2023   PLT 249.0 07/26/2023   GLUCOSE 108 (H) 07/26/2023   CHOL 240 (H) 07/26/2023   TRIG 133.0 07/26/2023   HDL 69.40 07/26/2023   LDLDIRECT 148.6 09/26/2008   LDLCALC 144 (H) 07/26/2023   ALT 20 07/26/2023   AST 17 07/26/2023   NA 136 07/26/2023   K 4.4 07/26/2023   CL 104 07/26/2023    CREATININE 0.72 07/26/2023   BUN 15 07/26/2023   CO2 25 07/26/2023   TSH 1.73 07/26/2023   HGBA1C 6.0 07/26/2023    Lab Results  Component Value Date   TSH 1.73 07/26/2023   Lab Results  Component Value Date   WBC 6.9 07/26/2023   HGB 15.3 (H) 07/26/2023   HCT 45.4 07/26/2023   MCV 102.7 (H) 07/26/2023   PLT 249.0 07/26/2023   Lab Results  Component Value Date   NA 136 07/26/2023   K 4.4 07/26/2023   CO2 25 07/26/2023   GLUCOSE 108 (H) 07/26/2023   BUN 15 07/26/2023   CREATININE 0.72 07/26/2023   BILITOT 0.8 07/26/2023   ALKPHOS 81 07/26/2023   AST 17 07/26/2023   ALT 20 07/26/2023   PROT 6.9 07/26/2023   ALBUMIN 4.0 07/26/2023   CALCIUM  9.0 07/26/2023   ANIONGAP 6 02/10/2017   GFR 80.61 07/26/2023   Lab Results  Component Value Date   CHOL 240 (H) 07/26/2023   Lab Results  Component Value Date   HDL 69.40 07/26/2023   Lab Results  Component Value Date   LDLCALC 144 (H) 07/26/2023   Lab Results  Component Value Date   TRIG 133.0 07/26/2023   Lab Results  Component Value Date   CHOLHDL 3 07/26/2023   Lab Results  Component Value Date   HGBA1C 6.0 07/26/2023       Assessment & Plan:  Essential hypertension Assessment & Plan: Well controlled, no changes to meds. Encouraged heart healthy diet such as the DASH diet and exercise as tolerated.     Hyperglycemia Assessment & Plan: hgba1c acceptable, minimize simple carbs. Increase exercise as tolerated.    Hyperlipidemia, mixed Assessment & Plan: Encourage heart healthy diet such as MIND or DASH diet, increase exercise, avoid trans fats, simple carbohydrates and processed foods, consider a krill or fish or flaxseed oil cap daily.     Osteopenia, unspecified location Assessment & Plan: Encouraged to get adequate exercise, calcium  and vitamin d  intake    Preventative health care Assessment & Plan: Patient encouraged to maintain heart healthy diet, regular exercise, adequate sleep. Consider  daily probiotics. Take medications as prescribed Labs ordered and reviewed  Colonoscopy 2019 repeat in 2024 [facility] 2022 repeat this year.  Pap aged out Dexa 12/22 repeat in 2 years RSV (respiratory syncitial virus) vaccine at pharmacy, Arexvy Covid booster at pharmacy High dose flu  shot  Shingrix is the new shingles shot, 2 shots over 2-6 months, confirm coverage with insurance and document, then can return here for shots with nurse appt or at pharmacy     Hx of adenomatous colonic polyps -     Ambulatory referral to Gastroenterology  Encounter for screening mammogram for malignant neoplasm of breast -     3D Screening Mammogram, Left and Right; Future  Post-menopausal -     DG Bone Density; Future  Estrogen deficiency -     DG Bone Density; Future    Assessment and Plan    Hyperlipidemia Patient reports fatigue and muscle aches possibly related to Atorvastatin . Discussed the possibility of statin intolerance and the plan to determine if symptoms are related to medication. -Discontinue Atorvastatin  for 2-4 weeks to assess for symptom improvement. -Resume Atorvastatin  and monitor for symptom recurrence. -Check cholesterol levels in 4 months.  Musculoskeletal Pain Patient reports neck pain and weakness in the right leg. Discussed the importance of regular exercise, stretching, and strength training. -Continue physical therapy and balance exercises. -Consider moist heat and stretching for neck pain.  Lung Cancer Screening Patient is a former smoker with a 50-year smoking history from roughly age [age] at about 1/2 ppd, making her a candidate for annual lung cancer screening. -Patient offered referral to Pulmonology for annual low-dose CT scan for lung cancer screening but declines for now  Coronary Artery Disease Elevated coronary artery calcium  score noted. Discussed the need for further evaluation and management. -Contact Dr. MARLA to discuss elevated coronary artery calcium   score and potential need for further cardiac work up. if she runs into trouble scheduling her appt she will let us  know  General Health Maintenance -Repeat mammogram and bone density scan in June 2025. -Refer back to GI for colonoscopy due in 2024. -Consider annual flu shot, COVID-19 booster, and shingles vaccine. -Continue Vitamin D  supplementation at 2000 IU daily. -Schedule follow-up visit in 4 months.         Harlene Horton, MD

## 2023-09-06 ENCOUNTER — Ambulatory Visit: Payer: Medicare Other | Admitting: Physical Therapy

## 2023-09-13 ENCOUNTER — Ambulatory Visit: Payer: Medicare Other | Admitting: Physical Therapy

## 2023-09-14 ENCOUNTER — Telehealth: Payer: Self-pay

## 2023-09-14 NOTE — Telephone Encounter (Signed)
Pt viewed CA Score results on My Chart per Dr. Vanetta Shawl note. Routed to PCP.

## 2023-09-19 ENCOUNTER — Ambulatory Visit: Payer: Medicare Other | Attending: Family Medicine

## 2023-09-19 ENCOUNTER — Other Ambulatory Visit: Payer: Self-pay

## 2023-09-19 DIAGNOSIS — R42 Dizziness and giddiness: Secondary | ICD-10-CM | POA: Diagnosis not present

## 2023-09-19 DIAGNOSIS — R2681 Unsteadiness on feet: Secondary | ICD-10-CM | POA: Insufficient documentation

## 2023-09-19 DIAGNOSIS — R252 Cramp and spasm: Secondary | ICD-10-CM | POA: Insufficient documentation

## 2023-09-19 NOTE — Therapy (Signed)
OUTPATIENT PHYSICAL THERAPY VESTIBULAR TREATMENT  Patient Name: Renee [name] MRN: [id number] DOB:21-Sep-1945, 78 y.o., female Today's Date: 09/19/2023  END OF SESSION:  PT End of Session - 09/19/23 1633     Visit Number 6    Date for PT Re-Evaluation 10/20/23    Authorization Type [employer]    Progress Note Due on Visit 10    PT Start Time 1430    PT Stop Time 1515    PT Time Calculation (min) 45 min    Activity Tolerance Patient tolerated treatment well    Behavior During Therapy [facility] for tasks assessed/performed                  Past Medical History:  Diagnosis Date   Abdominal pain 07/05/2017   ABNORMAL INVOLUNTARY MOVEMENTS 01/01/2009   Qualifier: Diagnosis of  By: Alwyn Ren MD, William     Allergic rhinitis 09/19/2008   Qualifier: Diagnosis of  By: Nena Jordan    Allergy    Arthralgia of right wrist 05/06/2020   Atypical chest pain 09/19/2008   Qualifier: Diagnosis of  By: Nena Jordan    Cervical cancer screening 05/08/2013   Menarche 11, regular Menopause 50s G2P2 s/p 2 svd No abnormal paps or MGM Fatty cyst on labia removed.    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   Qualifier: Diagnosis of  By: Nena Jordan    Dermatitis 09/17/2018   Diverticulosis 05/06/2017   Essential hypertension 09/19/2008   Qualifier: Diagnosis of  By: Nena Jordan    GERD (gastroesophageal reflux disease)    H/O osteopenia 02/08/2016   H/O tobacco use, presenting hazards to health 09/19/2008   Qualifier: Diagnosis of  By: Nena Jordan Smoking roughly 5 cig daily  Last cigarette was in December of 2016   Head tremor 12/27/2016   Hearing loss in right ear 09/19/2008   Qualifier: Diagnosis of  By: Nena Jordan    Hx of adenomatous colonic polyps 01/27/2018   Hyperglycemia 10/07/2016   Hyperlipidemia, mixed  10/03/2008   Qualifier: Diagnosis of  By: Nena Jordan    Lumbar radiculopathy 07/06/2021   Neuromuscular disorder (HCC)    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 Fixed sigmoid colon prevents. Referred for surgery.   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 02/09/2017   Tinnitus 12/12/2017   Tremor, essential    Vertigo    Vertigo    Past Surgical History:  Procedure Laterality Date   INNER EAR SURGERY  2015   with tube   MOUTH SURGERY  01/22/11   lower denture secured by titanium bolts   rectal suction biospy     XI ROBOTIC ASSISTED LOWER ANTERIOR RESECTION N/A 02/09/2017  Procedure: XI ROBOTIC RESECTION OF SIGMOID COLON, RIGID PROCTOSCOPY;  Surgeon: Karie Soda, MD;  Location: [facility];  Service: General;  Laterality: N/A;   Patient Active Problem List   Diagnosis Date Noted   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   Unsteady gait 05/11/2023   Myalgia 02/02/2023   Vitamin D deficiency 07/29/2022   Lumbar radiculopathy 07/06/2021   Osteopenia 05/14/2021   Change in bowel habits 05/14/2021   Plantar wart of both feet 10/26/2020   Dizziness    Tremor, essential    Neuromuscular disorder (HCC)    Allergy    Arthralgia of right wrist 05/06/2020   Strain of gluteus medius of right lower extremity 05/06/2020   Right hip pain 04/28/2020   Right chronic serous otitis media 05/21/2019   Dermatitis 09/17/2018   Shoulder pain 06/29/2018   Hx of adenomatous  colonic polyps 01/27/2018   Tinnitus 12/12/2017   Rib pain on left side 11/17/2017   Abdominal pain 07/05/2017   PVC's (premature ventricular contractions) 05/10/2017   Diverticulosis 05/06/2017   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 02/09/2017   Head tremor 12/27/2016   Polyp of sigmoid colon 12/08/2016   Hyperglycemia 10/07/2016   Obesity 07/06/2016   GERD (gastroesophageal reflux disease) 07/06/2016   H/O osteopenia 02/08/2016   Cervical pain (neck) 10/07/2015   Preventative health care 05/12/2013   Cervical cancer screening 05/08/2013   Abnormal involuntary movement 01/01/2009   Hyperlipidemia, mixed 10/03/2008   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   Essential hypertension 09/19/2008   Allergic rhinitis 09/19/2008   Atypical chest pain 09/19/2008   Hearing loss in right ear 09/19/2008   CHEST PAIN 09/19/2008    PCP: [name], MD REFERRING PROVIDER: [name], MD  REFERRING DIAG:  R42 (ICD-10-CM) - Dizziness  R26.81 (ICD-10-CM) - Unsteady gait    THERAPY DIAG:  Dizziness and giddiness  Cramp and spasm  Unsteadiness on feet  ONSET DATE: 05/10/23  Rationale for Evaluation and Treatment: Rehabilitation  SUBJECTIVE:   SUBJECTIVE STATEMENT: Pt states that her pain L side of post neck has been fairly intense.  Had carotid artery blood flow assessed and carotid is fine. From eval: Patient reports chronic head tremor, had PT for previously. She has always had some dizziness, but it has gotten worse recently, continuous. She recently obtained hearing aides and saw a neurologist who diagnosed her with cervical dystonia. She saw an ophthalmologist yesterday and her vision is normal. Pt accompanied by: self  PERTINENT HISTORY:  Per referring Dr note: Chronic Dizziness Persistent, non-vertiginous dizziness, worse with ambulation. No clear pattern or associated symptoms. No recent head trauma. Possible multifactorial etiology including cervical  dystonia and hearing loss. -Order MRI to rule out central causes. -Refer back to neurology for further evaluation. -Consider physical therapy for unsteady gait and dizziness.   Cervical Dystonia Chronic condition contributing to dizziness and muscle tension. Patient reluctant to try Botox therapy. -Discussed potential benefits of Botox therapy for symptom relief and prevention of further spinal damage. Patient to consider.   Hearing Loss Patient in process of getting hearing aids. Potential contributor to dizziness. -Encourage continuation of audiology follow-up and hearing aid trial.  PAIN:  Are you having pain? Yes: NPRS scale: 3/10 Pain location: R neck Pain description: stiffness Aggravating factors: Nothing identified Relieving factors: Tylenol  PRECAUTIONS: Fall  RED FLAGS: None   WEIGHT BEARING RESTRICTIONS: No  FALLS: Has patient fallen in last 6 months? No  LIVING ENVIRONMENT: Lives with: lives with their family and lives  with their spouse Lives in: House/apartment Stairs: Yes: Internal: 14 steps; on right going up Has following equipment at home: Single point cane  PLOF: Independent, likes to walk, but limited due to fear of falling, Tai-Chi 3 x a week.  PATIENT GOALS: Patient would like to identify the cause of the dizziness and treat it.  OBJECTIVE:  Note: Objective measures were completed at Evaluation unless otherwise noted.  DIAGNOSTIC FINDINGS: N/A  COGNITION: Overall cognitive status: Within functional limits for tasks assessed   SENSATION: Denies changes  EDEMA:  Denies edema  MUSCLE TONE:  WNL  POSTURE:  rounded shoulders and cervical tremor with neck held in mild rotation to R and R lat flex  Cervical ROM:    Active A/PROM (deg) eval  Flexion WFL  Extension 25%  Right lateral flexion 25%  Left lateral flexion 25%  Right rotation 30%  Left rotation 30%  (Blank rows = not tested)  Spurling's (-)  LOWER EXTREMITY MMT: WNL  BLE  FUNCTIONAL MOBILITY: I/MI with all bed mobility, transfers, ambulates up and down steps with U rail, pausing every 4 steps  GAIT: Gait pattern: step through pattern, decreased stride length, shuffling, and ataxic Distance walked: In clinic distances Assistive device utilized: None Level of assistance: Modified independence Comments: Patient reports she is fearful of walking outdoors due to fear of falling.  FUNCTIONAL TESTS:  Functional gait assessment: 16/28, increased fall risk MCTSIB: condition 1: 30 sec, condition 2: 30 sec; condition 3: 25 sec, condition 4: 30 sec  VESTIBULAR ASSESSMENT:  GENERAL OBSERVATION: chronic cervical tremors with head maintained in R rotaiton and lat flex   SYMPTOM BEHAVIOR:  Subjective history: Had ear surgery, R ear to remove a tumor, she had possible Meningitis as a child with temporary R sided weakness  Non-Vestibular symptoms:  Recently diagnosed with cervical dystonia, cervical tremors  Type of dizziness: Imbalance (Disequilibrium), Lightheadedness/Faint, and stepping backwards seems more challenging sometimes.  Frequency: constant  Duration: constant  Aggravating factors: No known aggravating factors  Relieving factors: lying supine  Progression of symptoms: worse  OCULOMOTOR EXAM:  Ocular Alignment: normal  Ocular ROM: No Limitations  Spontaneous Nystagmus: absent  Gaze-Induced Nystagmus: absent  Smooth Pursuits: intact  Saccades: intact  Convergence/Divergence: 10 cm   VESTIBULAR - OCULAR REFLEX:   Slow VOR: Normal  VOR Cancellation: Comment: Reported a slight blurriness when moving to the R  Head-Impulse Test: HIT Right: negative HIT Left: negative    POSITIONAL TESTING:  Dix-Hallpike Right: (-) Dix-Hallpike Left: (-) Sidelying Right and Left (-)  FUNCTIONAL GAIT: Functional gait assessment: 16/28   VESTIBULAR TREATMENT:                                                                                                    DATE:  09/19/23:  Reassessed FGA, also cervical spine ROM Supine for palpation and for manual techniques to assist with stretching L posterior c spine musculature. Stabilized/ depressed L scapula for L upper traps and L levator scapula stretch Isometric cervical pine retraction, rotation B, and flexion 5 sec holds, 5 reps each Sitting for  B shoulder ER with yellow t band resistance  Sitting for B shoulder horizontal abd with yellow t band  We discussed TPDN and copied PT information on dry needling for her to review in my chart below:    Trigger Point Dry Needling  What is Trigger Point Dry Needling (DN)? DN is a physical therapy technique used to treat muscle pain and dysfunction. Specifically, DN helps deactivate muscle trigger points (muscle knots).  A thin filiform needle is used to penetrate the skin and stimulate the underlying trigger point. The goal is for a local twitch response (LTR) to occur and for the trigger point to relax. No medication of any kind is injected during the procedure.   What Does Trigger Point Dry Needling Feel Like?  The procedure feels different for each individual patient. Some patients report that they do not actually feel the needle enter the skin and overall the process is not painful. Very mild bleeding may occur. However, many patients feel a deep cramping in the muscle in which the needle was inserted. This is the local twitch response.   How Will I feel after the treatment? Soreness is normal, and the onset of soreness may not occur for a few hours. Typically this soreness does not last longer than two days.  Bruising is uncommon, however; ice can be used to decrease any possible bruising.  In rare cases feeling tired or nauseous after the treatment is normal. In addition, your symptoms may get worse before they get better, this period will typically not last longer than 24 hours.   What Can I do After My Treatment? Increase your hydration by  drinking more water for the next 24 hours.  You may place ice or heat on the areas treated that have become sore, however, do not use heat on inflamed or bruised areas. Heat often brings more relief post needling. You can continue your regular activities, but vigorous activity is not recommended initially after the treatment for 24 hours. DN is best combined with other physical therapy such as strengthening, stretching, and other therapies.   What are the complications? While your therapist has had extensive training in minimizing the risks of trigger point dry needling, it is important to understand the risks of any procedure.  Risks include bleeding, pain, fatigue, hematoma, infection, vertigo, nausea or nerve involvement. Monitor for any changes to your skin or sensation. Contact your therapist or MD with concerns.  A rare but serious complication is a pneumothorax over or near your middle and upper chest and back If you have dry needling in this area, monitor for the following symptoms: Shortness of breath on exertion and/or Difficulty taking a deep breath and/or Chest Pain and/or A dry cough If any of the above symptoms develop, please go to the nearest emergency room or call 911. Tell them you had dry needling over your thorax and report any symptoms you are having. Please follow-up with your treating therapist after you complete the medical evaluation.  08/15/23 NuStep L5 x 6 min In parallel bars, standing on airex with normal BOS, then narrow BOS, performed weight shifts ant/post, the lat, no UE support Moved to tandem stance on airex plank with weight shifts, she notes RLE feels weaker, but better balance overall noted. Standing in parallel bars, step back  with RLE and rotate to R, reaching back to touch wall directly behind. Performed easily. Repeat while standing on airex pad. Slightly more difficulty, especially in R stance, but no LOB. Supine  STM to B cerv paraspinals, UT, LS,  emphasizing R side as this was more tight. Seated stretch to UT  08/02/23 UBE L1; 2 min fwd, 2 min bwd Diaphragmatic breath (inhale 4, hold 7, exhale 8) x10 to relax neck muscles UT stretch x 30 sec R&L Feet together on airex EC 2x30" Feet together on airex EC head ext x30" Feet together on airex arm elevation EO and then EC x10 each Isometrics L cervical rotation 5x5" Backwards/forwards rocking with foot on blue side of bosu 2x10 for backwards stepping Staggered standing with one foot on blue side of bosu 2x30" Fwd/bwds walking against resistance 10# on each side  07/19/23 HEP review  UBE L2 x33mins each way  STM to neck and passive stretching- some use of massage gun Chin tucks against resistance red 2x10 VOR x1 and x2 seated 15 reps Walking on beam    07/12/23 Sitting levator stretch x 30 sec R&L Sitting anterior scalene stretch x 30 sec R&L Sitting upper trap stretch x 30 sec R&L Manual therapy: STM & TPR UT, levator, cervical paraspinals See assessment above Sitting thoracic extension x10 Sitting thoracic extension + rotation x10 Feet together VOR x1 horizontal and then vertical x30 sec each (reports increased dizziness with vertical) Feet together VOR cancellation horizontal and then vertical x30 sec each (increased dizziness with vertical)  06/29/23 Education  PATIENT EDUCATION: Education details: POC Person educated: Patient Education method: Explanation Education comprehension: verbalized understanding  HOME EXERCISE PROGRAM:  GOALS: Goals reviewed with patient? Yes  SHORT TERM GOALS: Target date: 07/20/23  I with initial HEP Baseline: Goal status: 08/15/23-Has HEP, met LONG TERM GOALS: Target date: 09/07/23  I with final HEP Baseline:  Goal status: INITIAL  2.  Increase FGA score to at least 24/28 Baseline:  Goal status: INITIAL  3.  Patient will achieve at least 75% of normal cervical ROM. Baseline:  Goal status: INITIAL  4.  Patient will  ambulate at least 500' on paved surfaces I, with no episodes of balance loss Baseline:  Goal status: INITIAL  5.  Patient will report decrease is dizziness/unsteadiness episodes by 50% Baseline: Constant Goal status: 08/15/23- Dizziness improved, but still progressing  ASSESSMENT:  CLINICAL IMPRESSION: Pt returned today following nearly one month hiatus.  She therefore was reevaluated.  She scored much better on the FGA as compared to her initial session, she states her balance deficits come and go. Her greatest concern was regarding the recent pain L paraspinals.  Palpable increase in muscular tension L C 4/5 area, consistent with levator scapulae and upper traps .  She responded well to manual stretch for these muscles. We discussed TPDN, but she was hesitant, wanted to review. At this point her balance is improved from initial 7 visits.  She wishes to evaluate and think about some of the other options presented to her regarding her neck such as the neurologist mentioned Botox injections.  She is going to call back if further PT visits needed.  If she calls back within  one month would recommend assessment again and TPDN for cervical musculature on L and additional balance assessment.  From eval: Patient is a 78 y.o. who was seen today for physical therapy evaluation and treatment for recurrent and increasing dizziness and unsteady gait. She was seen 2 years ago for B BPPV and VOR re-education with improved symptoms. She presents with cervical dystonia and tremors tremors and stiffness, constant mild unsteadiness, no dizziness. She reports increased neck and shoulder pain. VOR testing  was Neg, but score on FGA indicated increased fall risk. Unable to complete assessment due to the depth of testing required, will complete BPPV and m-CTSIB assessment on next visit further clarify which sensory systems may be contributing to her imbalance, but she will benefit from PT to address her tightness and poor  balance to decrease fall risk and dizziness. She has also started to see a Neurologist who may be able to further clarify the causes of her dificits  OBJECTIVE IMPAIRMENTS: Abnormal gait, decreased activity tolerance, decreased balance, decreased coordination, difficulty walking, decreased ROM, decreased strength, increased muscle spasms, postural dysfunction, and pain.   ACTIVITY LIMITATIONS: carrying, lifting, bending, standing, squatting, sleeping, stairs, and locomotion level  PARTICIPATION LIMITATIONS: meal prep, cleaning, laundry, driving, shopping, and community activity  PERSONAL FACTORS: Past/current experiences are also affecting patient's functional outcome.   REHAB POTENTIAL: Good  CLINICAL DECISION MAKING: Evolving/moderate complexity  EVALUATION COMPLEXITY: Moderate   PLAN:  PT FREQUENCY: TBD  PT DURATION: 4 weeks 10/20/23  PLANNED INTERVENTIONS: 97110-Therapeutic exercises, 97530- Therapeutic activity, 97112- Neuromuscular re-education, 97535- Self Care, 16109- Manual therapy, [phone]- Gait training, [phone]- Canalith repositioning, [phone]- Traction (mechanical), Patient/Family education, Balance training, Stair training, Dry Needling, Joint mobilization, Spinal mobilization, Vestibular training, Visual/preceptual remediation/compensation, Cryotherapy, and Moist heat  PLAN FOR NEXT SESSION: reassess c spine and balance, then continue Rx plan as needed.    Marisa Hage L Sundra Haddix, PT, DPT, OCS 09/19/2023, 4:35 PM

## 2023-11-30 ENCOUNTER — Encounter: Payer: Self-pay | Admitting: Cardiology

## 2023-11-30 ENCOUNTER — Ambulatory Visit: Payer: Medicare Other | Attending: Cardiology | Admitting: Cardiology

## 2023-11-30 VITALS — BP 160/80 | HR 68 | Ht 66.0 in | Wt 223.0 lb

## 2023-11-30 DIAGNOSIS — I10 Essential (primary) hypertension: Secondary | ICD-10-CM | POA: Diagnosis not present

## 2023-11-30 DIAGNOSIS — R918 Other nonspecific abnormal finding of lung field: Secondary | ICD-10-CM

## 2023-11-30 DIAGNOSIS — R0609 Other forms of dyspnea: Secondary | ICD-10-CM | POA: Insufficient documentation

## 2023-11-30 DIAGNOSIS — E782 Mixed hyperlipidemia: Secondary | ICD-10-CM

## 2023-11-30 HISTORY — DX: Other forms of dyspnea: R06.09

## 2023-11-30 MED ORDER — AMLODIPINE BESYLATE 5 MG PO TABS: 5.0000 mg | ORAL_TABLET | Freq: Every day | ORAL | 3 refills | Status: DC

## 2023-11-30 MED ORDER — PRAVASTATIN SODIUM 40 MG PO TABS
40.0000 mg | ORAL_TABLET | Freq: Every evening | ORAL | 3 refills | Status: DC
Start: 2023-11-30 — End: 2024-02-16

## 2023-11-30 NOTE — Patient Instructions (Addendum)
 Medication Instructions:   STOP: Lipitor  START: Pravastatin 40mg  1 tablet daily  START: Amlodipine 5mg  1 tablet daily   Lab Work: 3rd Floor   [address]  Your physician recommends that you return for lab work in:   6 weeks You need to have labs done when you are fasting.  You can come Monday through Friday 8:00 am to 11:30AM and 1:00 to 4:00. You do not need to make an appointment as the order has already been placed.    Testing/Procedures: Exercise Cardiolite  Your physician has requested that you have a lexiscan myoview. For further information please visit [email or web address] Please follow instruction sheet, as given.  The test will take approximately 3 to 4 hours to complete; you may bring reading material.  If someone comes with you to your appointment, they will need to remain in the main lobby due to limited space in the testing area.    How to prepare for your Myocardial Perfusion Test: Do not eat or drink 3 hours prior to your test, except you may have water. Do not consume products containing caffeine (regular or decaffeinated) 12 hours prior to your test. (ex: coffee, chocolate, sodas, tea). Do bring a list of your current medications with you.  If not listed below, you may take your medications as normal. Do wear comfortable clothes (no dresses or overalls) and walking shoes, tennis shoes preferred (No heels or open toe shoes are allowed). Do NOT wear cologne, perfume, aftershave, or lotions (deodorant is allowed). If these instructions are not followed, your test will have to be rescheduled.    Your physician has requested that you have an echocardiogram. Echocardiography is a painless test that uses sound waves to create images of your heart. It provides your doctor with information about the size and shape of your heart and how well your heart's chambers and valves are working. This procedure takes approximately one hour. There are no restrictions for this  procedure. Please do NOT wear cologne, perfume, aftershave, or lotions (deodorant is allowed). Please arrive 15 minutes prior to your appointment time.  Please note: We ask at that you not bring children with you during ultrasound (echo/ vascular) testing. Due to room size and safety concerns, children are not allowed in the ultrasound rooms during exams. Our front office staff cannot provide observation of children in our lobby area while testing is being conducted. An adult accompanying a patient to their appointment will only be allowed in the ultrasound room at the discretion of the ultrasound technician under special circumstances. We apologize for any inconvenience.   Non-Cardiac CT Angiography (CTA), is a special type of CT scan that uses a computer to produce multi-dimensional views of major blood vessels throughout the body. In CT angiography, a contrast material is injected through an IV to help visualize the blood vessels    Follow-Up: At [facility], you and your health needs are our priority.  As part of our continuing mission to provide you with exceptional heart care, we have created designated Provider Care Teams.  These Care Teams include your primary Cardiologist (physician) and Advanced Practice Providers (APPs -  Physician Assistants and Nurse Practitioners) who all work together to provide you with the care you need, when you need it.  We recommend signing up for the patient portal called "MyChart".  Sign up information is provided on this After Visit Summary.  MyChart is used to connect with patients for Virtual Visits (Telemedicine).  Patients are able to view  lab/test results, encounter notes, upcoming appointments, etc.  Non-urgent messages can be sent to your provider as well.   To learn more about what you can do with MyChart, go to [email or web address].    Your next appointment:   3 month(s)  The format for your next appointment:   In Person  Provider:   Gypsy Balsam, MD    Other Instructions NA

## 2023-11-30 NOTE — Progress Notes (Signed)
 Cardiology Office Note:    Date:  11/30/2023   ID:  Renee [name], DOB 1945-12-16, MRN [id number]  PCP:  [name], MD  Cardiologist:  Gypsy Balsam, MD    Referring MD: [name], MD   Chief Complaint  Patient presents with   Follow-up    History of Present Illness:    Renee [name] is a 78 y.o. female past medical history significant for dyslipidemia, atypical chest pain, dyspnea on exertion, palpitation, PVCs.  Comes today to months for follow-up.  She complained of being short of breath more than usually.  Described to have some uneasy sensation sometimes in the chest but not necessarily related to exercise.  Denies having palpitations no significant swelling of lower extremities, she still do tai chi but sometimes is unable to finish.  Past Medical History:  Diagnosis Date   Abdominal pain 07/05/2017   ABNORMAL INVOLUNTARY MOVEMENTS 01/01/2009   Qualifier: Diagnosis of  By: Alwyn Ren MD, William     Allergic rhinitis 09/19/2008   Qualifier: Diagnosis of  By: Nena Jordan    Allergy    Arthralgia of right wrist 05/06/2020   Atypical chest pain 09/19/2008   Qualifier: Diagnosis of  By: Nena Jordan    Cervical cancer screening 05/08/2013   Menarche 11, regular Menopause 50s G2P2 s/p 2 svd No abnormal paps or MGM Fatty cyst on labia removed.    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   Qualifier: Diagnosis of  By: Nena Jordan    Dermatitis 09/17/2018   Diverticulosis 05/06/2017   Essential hypertension 09/19/2008   Qualifier: Diagnosis of  By: Nena Jordan    GERD (gastroesophageal reflux disease)    H/O osteopenia 02/08/2016   H/O tobacco use, presenting hazards to health 09/19/2008   Qualifier: Diagnosis of  By: Nena Jordan Smoking roughly 5 cig daily  Last cigarette was in December of 2016    Head tremor 12/27/2016   Hearing loss in right ear 09/19/2008   Qualifier: Diagnosis of  By: Nena Jordan    Hx of adenomatous colonic polyps 01/27/2018   Hyperglycemia 10/07/2016   Hyperlipidemia, mixed 10/03/2008   Qualifier: Diagnosis of  By: Nena Jordan    Lumbar radiculopathy 07/06/2021   Neuromuscular disorder (HCC)    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 Fixed sigmoid colon prevents. Referred for surgery.   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 02/09/2017   Tinnitus 12/12/2017   Tremor, essential    Vertigo    Vertigo     Past Surgical History:  Procedure Laterality Date   INNER EAR SURGERY  2015   with tube   MOUTH SURGERY  01/22/11   lower denture  secured by titanium bolts   rectal suction biospy     XI ROBOTIC ASSISTED LOWER ANTERIOR RESECTION N/A 02/09/2017   Procedure: XI ROBOTIC RESECTION OF SIGMOID COLON, RIGID PROCTOSCOPY;  Surgeon: Karie Soda, MD;  Location: [facility];  Service: General;  Laterality: N/A;    Current Medications: Current Meds  Medication Sig   atorvastatin (LIPITOR) 10 MG tablet Take 0.5 tablets (5 mg total) by mouth every other day.   b complex vitamins tablet Take 1 tablet by mouth once a week.   clobetasol cream (TEMOVATE) 0.05 % Apply 1 Application topically 2 (two) times daily as needed (Rash).   Coenzyme Q10 200 MG capsule Take 200 mg by mouth once a week.    famotidine (PEPCID) 20 MG tablet  TAKE 1 TABLET BY MOUTH TWICE A DAY (Patient taking differently: Take 20 mg by mouth as needed for heartburn or indigestion.)   hydrocortisone 2.5 % cream Apply 1 Application topically 2 (two) times daily.   metoprolol succinate (TOPROL-XL) 25 MG 24 hr tablet Take 0.5 tablets (12.5 mg total) by mouth daily. Take an extra metoprolol daily as needed.   Multiple Vitamin (MULTIVITAMIN WITH MINERALS) TABS tablet Take 1 tablet by mouth every other day.   triamcinolone cream (KENALOG) 0.1 % Apply 1 Application topically 2 (two) times daily.     Allergies:   Amoxicillin, Proair hfa [albuterol], and Penicillins   Social History   Socioeconomic History   Marital status: Married    Spouse name: Molly Maduro   Number of children: 2   Years of education: Not on file   Highest education level: Associate degree: occupational, Scientist, product/process development, or vocational program  Occupational History   Occupation: retired    Comment: Photographer  Tobacco Use   Smoking status: Former    Current packs/day: 0.00    Average packs/day: 0.5 packs/day for 53.0 years (26.5 ttl pk-yrs)    Types: Cigarettes    Start date: 1964    Quit date: 09/07/2015    Years since quitting: 8.2   Smokeless tobacco: Never  Vaping Use   Vaping status: Never Used  Substance and Sexual Activity   Alcohol use: Yes    Alcohol/week: 14.0 standard drinks of alcohol    Types: 14 Glasses of wine per week    Comment: 2 glasses wine/day   Drug use: No   Sexual activity: Yes  Other Topics Concern   Not on file  Social History Narrative   Married, retired   2 children   Former smoker no drug use, does drink alcohol   Right handed   Social Drivers of Corporate investment banker Strain: Low Risk  (03/08/2023)   Overall Financial Resource Strain (CARDIA)    Difficulty of Paying Living Expenses: Not hard at all  Food Insecurity: No Food Insecurity (03/08/2023)   Hunger Vital Sign    Worried About Running Out of Food in the Last Year: Never true    Ran  Out of Food in the Last Year: Never true  Transportation Needs: No Transportation Needs (03/08/2023)   PRAPARE - Administrator, Civil Service (Medical): No    Lack of Transportation (Non-Medical): No  Physical Activity: Sufficiently Active (03/08/2023)   Exercise Vital Sign    Days of Exercise per Week: 3 days    Minutes of Exercise per Session: 60 min  Stress: No Stress Concern Present (03/08/2023)   [employer] of Occupational Health - Occupational Stress Questionnaire    Feeling of Stress :  Only a little  Recent Concern: Stress - Stress Concern Present (02/01/2023)   [employer] of Occupational Health - Occupational Stress Questionnaire    Feeling of Stress : To some extent  Social Connections: Socially Integrated (03/08/2023)   Social Connection and Isolation Panel [NHANES]    Frequency of Communication with Friends and Family: More than three times a week    Frequency of Social Gatherings with Friends and Family: Three times a week    Attends Religious Services: More than 4 times per year    Active Member of Clubs or Organizations: Yes    Attends Banker Meetings: 1 to 4 times per year    Marital Status: Married     Family History: The patient's family history includes Alcohol abuse in her mother; Diabetes in her daughter; Heart attack (age of onset: 67) in her father; Heart disease in her paternal grandfather and paternal grandmother; Hypertension in her mother; Obesity in her daughter; Stroke in her mother; Tremor in her mother. There is no history of Colon cancer, Breast cancer, Prostate cancer, Liver cancer, Rectal cancer, or Stomach cancer. ROS:   Please see the history of present illness.    All 14 point review of systems negative except as described per history of present illness  EKGs/Labs/Other Studies Reviewed:         Recent Labs: 02/10/2023: Magnesium 2.2 07/26/2023: ALT 20; BUN 15; Creatinine, Ser 0.72; Hemoglobin 15.3; Platelets  249.0; Potassium 4.4; Sodium 136; TSH 1.73  Recent Lipid Panel    Component Value Date/Time   CHOL 240 (H) 07/26/2023 1050   TRIG 133.0 07/26/2023 1050   HDL 69.40 07/26/2023 1050   CHOLHDL 3 07/26/2023 1050   VLDL 26.6 07/26/2023 1050   LDLCALC 144 (H) 07/26/2023 1050   LDLCALC 139 (H) 04/24/2020 0912   LDLDIRECT 148.6 09/26/2008 0811    Physical Exam:    VS:  BP (!) 160/80 (BP Location: Right Arm, Patient Position: Sitting)   Pulse 68   Ht 5\' 6"  (1.676 m)   Wt 223 lb (101.2 kg)   SpO2 92%   BMI 35.99 kg/m     Wt Readings from Last 3 Encounters:  11/30/23 223 lb (101.2 kg)  08/30/23 223 lb 12.8 oz (101.5 kg)  07/05/23 223 lb 12.8 oz (101.5 kg)     GEN:  Well nourished, well developed in no acute distress HEENT: Normal NECK: No JVD; No carotid bruits LYMPHATICS: No lymphadenopathy CARDIAC: RRR, no murmurs, no rubs, no gallops RESPIRATORY:  Clear to auscultation without rales, wheezing or rhonchi  ABDOMEN: Soft, non-tender, non-distended MUSCULOSKELETAL:  No edema; No deformity  SKIN: Warm and dry LOWER EXTREMITIES: no swelling NEUROLOGIC:  Alert and oriented x 3 PSYCHIATRIC:  Normal affect   ASSESSMENT:    1. Dyspnea on exertion   2. Essential hypertension    PLAN:    In order of problems listed above:  Dyspnea on exertion obviously concerning a more about potential angina equivalent.  She did have coronary calcium score done which is elevated 438 which is 81 percentile.  That put her at high risk for potentially having coronary event.  I did calculate her predicted 10 years risk which is 21% which is considered high.  Will schedule her to have exercise Cardiolite to see if there is any inducible ischemia I will also schedule her to have echocardiogram to assess left ventricle ejection fraction. Dyslipidemia she takes only 5 mg of Lipitor 3 days a week which is obviously not  enough I will switch her to pravastatin 40 hopefully should be able to tolerate it.  I  anticipate need to add some extra medication to get her cholesterol under control.  In my opinion Target cholesterol need to be less than 70. Essential hypertension she is excited about send negative today elevated.  Will ask her to start taking amlodipine 5 mg daily.   Medication Adjustments/Labs and Tests Ordered: Current medicines are reviewed at length with the patient today.  Concerns regarding medicines are outlined above.  No orders of the defined types were placed in this encounter.  Medication changes: No orders of the defined types were placed in this encounter.   Signed, Georgeanna Lea, MD, [facility] 11/30/2023 10:01 AM    [facility]

## 2023-11-30 NOTE — Addendum Note (Signed)
 Addended by: Baldo Ash D on: 11/30/2023 10:36 AM   Modules accepted: Orders

## 2023-12-07 NOTE — Addendum Note (Signed)
 Addended by: Ralene Burger on: 12/07/2023 09:35 AM   Modules accepted: Orders

## 2023-12-22 ENCOUNTER — Ambulatory Visit (HOSPITAL_BASED_OUTPATIENT_CLINIC_OR_DEPARTMENT_OTHER)
Admission: RE | Admit: 2023-12-22 | Discharge: 2023-12-22 | Disposition: A | Discharge status: Home / Self Care | Source: Ambulatory Visit | Attending: Cardiology | Admitting: Cardiology

## 2023-12-22 DIAGNOSIS — R0609 Other forms of dyspnea: Secondary | ICD-10-CM | POA: Diagnosis not present

## 2023-12-23 LAB — ECHOCARDIOGRAM COMPLETE
AR max vel: 1.74 cm2
AV Area VTI: 1.75 cm2
AV Area mean vel: 1.81 cm2
AV Mean grad: 2 mmHg
AV Peak grad: 4.4 mmHg
Ao pk vel: 1.05 m/s
Area-P 1/2: 3.5 cm2
Calc EF: 65.5 %
S' Lateral: 2.9 cm
Single Plane A2C EF: 66 %
Single Plane A4C EF: 64.5 %

## 2023-12-26 ENCOUNTER — Encounter (HOSPITAL_COMMUNITY)

## 2023-12-26 DIAGNOSIS — L821 Other seborrheic keratosis: Secondary | ICD-10-CM | POA: Diagnosis not present

## 2023-12-26 DIAGNOSIS — D485 Neoplasm of uncertain behavior of skin: Secondary | ICD-10-CM | POA: Diagnosis not present

## 2023-12-26 DIAGNOSIS — L820 Inflamed seborrheic keratosis: Secondary | ICD-10-CM | POA: Diagnosis not present

## 2023-12-27 ENCOUNTER — Ambulatory Visit (HOSPITAL_BASED_OUTPATIENT_CLINIC_OR_DEPARTMENT_OTHER): Payer: Medicare Other

## 2023-12-27 ENCOUNTER — Other Ambulatory Visit (HOSPITAL_BASED_OUTPATIENT_CLINIC_OR_DEPARTMENT_OTHER): Payer: Medicare Other

## 2023-12-29 ENCOUNTER — Telehealth: Payer: Self-pay

## 2023-12-29 ENCOUNTER — Encounter: Payer: Self-pay | Admitting: Cardiology

## 2023-12-29 NOTE — Telephone Encounter (Signed)
 Left message on My Chart with Echo results per Dr. Vanetta Shawl note. Routed to PCP.

## 2024-01-05 ENCOUNTER — Encounter (HOSPITAL_BASED_OUTPATIENT_CLINIC_OR_DEPARTMENT_OTHER): Payer: Self-pay

## 2024-01-05 ENCOUNTER — Other Ambulatory Visit (HOSPITAL_BASED_OUTPATIENT_CLINIC_OR_DEPARTMENT_OTHER)

## 2024-01-05 ENCOUNTER — Ambulatory Visit (HOSPITAL_BASED_OUTPATIENT_CLINIC_OR_DEPARTMENT_OTHER)
Admission: RE | Admit: 2024-01-05 | Discharge: 2024-01-05 | Disposition: A | Discharge status: Home / Self Care | Source: Ambulatory Visit | Attending: Family Medicine | Admitting: Family Medicine

## 2024-01-05 DIAGNOSIS — Z1231 Encounter for screening mammogram for malignant neoplasm of breast: Secondary | ICD-10-CM | POA: Insufficient documentation

## 2024-01-05 DIAGNOSIS — E782 Mixed hyperlipidemia: Secondary | ICD-10-CM | POA: Diagnosis not present

## 2024-01-06 LAB — ALT: ALT: 25 IU/L (ref 0–32)

## 2024-01-06 LAB — LIPID PANEL
Chol/HDL Ratio: 3 ratio (ref 0.0–4.4)
Cholesterol, Total: 205 mg/dL — ABNORMAL HIGH (ref 100–199)
HDL: 69 mg/dL (ref >39)
LDL Chol Calc (NIH): 113 mg/dL — ABNORMAL HIGH (ref 0–99)
Triglycerides: 132 mg/dL (ref 0–149)
VLDL Cholesterol Cal: 23 mg/dL (ref 5–40)

## 2024-01-06 LAB — AST: AST: 21 IU/L (ref 0–40)

## 2024-01-10 ENCOUNTER — Ambulatory Visit (HOSPITAL_COMMUNITY)

## 2024-01-12 ENCOUNTER — Encounter: Payer: Self-pay | Admitting: Cardiology

## 2024-01-12 ENCOUNTER — Ambulatory Visit: Payer: Self-pay | Admitting: Cardiology

## 2024-01-22 NOTE — Assessment & Plan Note (Signed)
 Well controlled, no changes to meds. Encouraged heart healthy diet such as the DASH diet and exercise as tolerated.

## 2024-01-22 NOTE — Assessment & Plan Note (Signed)
 Hydrate and monitor

## 2024-01-22 NOTE — Assessment & Plan Note (Signed)
 Supplement and monitor

## 2024-01-22 NOTE — Assessment & Plan Note (Signed)
 Encourage heart healthy diet such as MIND or DASH diet, increase exercise, avoid trans fats, simple carbohydrates and processed foods, consider a krill or fish or flaxseed oil cap daily.

## 2024-01-22 NOTE — Assessment & Plan Note (Signed)
 Long standing but now with increasing sense of disequilibrium, hearing loss. Was referred to neurology for evaluation.

## 2024-01-22 NOTE — Assessment & Plan Note (Signed)
 [id number] acceptable, minimize simple carbs. Increase exercise as tolerated.

## 2024-01-26 ENCOUNTER — Encounter: Payer: Self-pay | Admitting: Family Medicine

## 2024-01-26 ENCOUNTER — Ambulatory Visit (INDEPENDENT_AMBULATORY_CARE_PROVIDER_SITE_OTHER): Payer: Medicare Other | Admitting: Family Medicine

## 2024-01-26 VITALS — BP 134/86 | HR 82 | Resp 16 | Ht 66.0 in | Wt 222.2 lb

## 2024-01-26 DIAGNOSIS — E782 Mixed hyperlipidemia: Secondary | ICD-10-CM

## 2024-01-26 DIAGNOSIS — K219 Gastro-esophageal reflux disease without esophagitis: Secondary | ICD-10-CM

## 2024-01-26 DIAGNOSIS — R739 Hyperglycemia, unspecified: Secondary | ICD-10-CM | POA: Diagnosis not present

## 2024-01-26 DIAGNOSIS — E559 Vitamin D deficiency, unspecified: Secondary | ICD-10-CM | POA: Diagnosis not present

## 2024-01-26 DIAGNOSIS — I10 Essential (primary) hypertension: Secondary | ICD-10-CM

## 2024-01-26 DIAGNOSIS — K2289 Other specified disease of esophagus: Secondary | ICD-10-CM | POA: Diagnosis not present

## 2024-01-26 DIAGNOSIS — M791 Myalgia, unspecified site: Secondary | ICD-10-CM | POA: Diagnosis not present

## 2024-01-26 DIAGNOSIS — R1310 Dysphagia, unspecified: Secondary | ICD-10-CM | POA: Diagnosis not present

## 2024-01-26 DIAGNOSIS — G709 Myoneural disorder, unspecified: Secondary | ICD-10-CM

## 2024-01-26 LAB — CBC WITH DIFFERENTIAL/PLATELET
Basophils Absolute: 0.1 10*3/uL (ref 0.0–0.1)
Basophils Relative: 0.8 % (ref 0.0–3.0)
Eosinophils Absolute: 0.1 10*3/uL (ref 0.0–0.7)
Eosinophils Relative: 1.8 % (ref 0.0–5.0)
HCT: 46.7 % — ABNORMAL HIGH (ref 36.0–46.0)
Hemoglobin: 15.4 g/dL — ABNORMAL HIGH (ref 12.0–15.0)
Lymphocytes Relative: 24.4 % (ref 12.0–46.0)
Lymphs Abs: 1.6 10*3/uL (ref 0.7–4.0)
MCHC: 32.9 g/dL (ref 30.0–36.0)
MCV: 100.4 fl — ABNORMAL HIGH (ref 78.0–100.0)
Monocytes Absolute: 0.7 10*3/uL (ref 0.1–1.0)
Monocytes Relative: 9.6 % (ref 3.0–12.0)
Neutro Abs: 4.3 10*3/uL (ref 1.4–7.7)
Neutrophils Relative %: 63.4 % (ref 43.0–77.0)
Platelets: 242 10*3/uL (ref 150.0–400.0)
RBC: 4.65 Mil/uL (ref 3.87–5.11)
RDW: 14.6 % (ref 11.5–15.5)
WBC: 6.8 10*3/uL (ref 4.0–10.5)

## 2024-01-26 LAB — TSH: TSH: 1.7 u[IU]/mL (ref 0.35–5.50)

## 2024-01-26 LAB — HEMOGLOBIN A1C: Hgb A1c MFr Bld: 6.2 % (ref 4.6–6.5)

## 2024-01-26 MED ORDER — FAMOTIDINE 20 MG PO TABS: 20.0000 mg | ORAL_TABLET | Freq: Every evening | ORAL | 1 refills | Status: DC | PRN

## 2024-01-26 NOTE — Progress Notes (Signed)
 Subjective:    Patient ID: Renee Renee [name], female    DOB: [birth year], 78 y.o.   MRN: [id number]  Chief Complaint  Patient presents with   Medical Management of Chronic Issues    Patient presents today for a 5 month follow-up.   Quality Metric Gaps    AWV, lung cancer screening, DEXA scan, colonoscopy, zoster, TDAP    HPI Discussed the use of AI scribe software for clinical note transcription with the patient, who gave verbal consent to proceed.  History of Present Illness Renee Renee [name] is a 78 year old female with hypertension and hyperlipidemia who presents with medication side effects and follow-up on heart tests.  She has been experiencing nausea and lethargy after starting pravastatin  40 mg, which she took for six weeks. The nausea occurs primarily at night, and she feels lethargic, tired, and sick to her stomach. After consulting with a nurse and pharmacist, she reduced the dose to 20 mg.  She is currently taking amlodipine  and metoprolol  for blood pressure management but has not been monitoring her blood pressure at [name] due to issues with her [name] machine. She is concerned about the lack of communication regarding her heart test results and medication adjustments.  She mentions having plantar warts on the bottom of her feet, which she attributes to a possible virus. She has been using a treatment provided by a dermatologist, which seems to help, and plans to consult a podiatrist for further management.  She has been taking famotidine  as needed for gastrointestinal discomfort, typically at night, and requested a renewal of this medication during the visit.  No recent fevers or emergency room visits. She notes a history of feeling sick and lethargic from the statin medication.    Past Medical History:  Diagnosis Date   Abdominal pain 07/05/2017   ABNORMAL INVOLUNTARY MOVEMENTS 01/01/2009   Qualifier: Diagnosis of  By: Renee Gale MD, Renee Renee [name]     Allergic rhinitis 09/19/2008    Qualifier: Diagnosis of  By: Renee Renee [name]    Allergy    Arthralgia of right wrist 05/06/2020   Atypical chest pain 09/19/2008   Qualifier: Diagnosis of  By: Renee Renee [name]    Cervical cancer screening 05/08/2013   Menarche 11, regular Menopause 50s G2P2 s/p 2 svd No abnormal paps or [employer] Fatty cyst on labia removed.    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   Qualifier: Diagnosis of  By: Renee Renee [name]    Dermatitis 09/17/2018   Diverticulosis 05/06/2017   Essential hypertension 09/19/2008   Qualifier: Diagnosis of  By: Renee Renee [name]    GERD (gastroesophageal reflux disease)    H/O osteopenia 02/08/2016   H/O tobacco use, presenting hazards to health 09/19/2008   Qualifier: Diagnosis of  By: Renee Renee [name] Smoking roughly 5 cig daily  Last cigarette was in December of 2016   Head tremor 12/27/2016   Hearing loss in right ear 09/19/2008   Qualifier: Diagnosis of  By: Renee Renee [name]    Hx of adenomatous colonic polyps 01/27/2018   Hyperglycemia 10/07/2016   Hyperlipidemia, mixed 10/03/2008   Qualifier: Diagnosis of  By: Renee Renee [name]    Lumbar radiculopathy 07/06/2021   Neuromuscular disorder (HCC)    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 Fixed sigmoid colon prevents. Referred for surgery.   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 02/09/2017   Tinnitus 12/12/2017   Tremor, essential    Vertigo    Vertigo     Past Surgical History:  Procedure Laterality Date   INNER EAR SURGERY  2015   with tube   MOUTH SURGERY  01/22/11   lower denture secured by titanium bolts   rectal suction biospy     XI ROBOTIC ASSISTED LOWER ANTERIOR RESECTION N/A 02/09/2017   Procedure: XI ROBOTIC RESECTION OF SIGMOID COLON, RIGID PROCTOSCOPY;  Surgeon: Renee Champagne, MD;  Location: [facility];  Service: General;  Laterality: N/A;    Family History  Problem Relation Age of Onset   Stroke Mother    Hypertension Mother    Alcohol abuse Mother    Tremor Mother    Heart attack Father 2       deceased   Diabetes Daughter    Obesity Daughter    Heart disease Paternal Grandmother        AAA rupture   Heart disease Paternal Grandfather        MI   Colon cancer Neg Hx    Breast cancer Neg Hx    Prostate cancer Neg Hx    Liver cancer Neg Hx    Rectal cancer Neg Hx    Stomach cancer Neg Hx     Social History   Socioeconomic History   Marital status: Married    Spouse name: Renee Renee [name]   Number of children: 2   Years of education: Not on file   Highest education level: Some college, no degree  Occupational History   Occupation: retired    Comment: Photographer  Tobacco Use   Smoking status: Former    Current packs/day: 0.00    Average packs/day: 0.5 packs/day for 53.0 years (26.5 ttl pk-yrs)    Types: Cigarettes    Start date: 1964    Quit date: 09/07/2015    Years since quitting: 8.3   Smokeless tobacco: Never  Vaping Use   Vaping status: Never Used  Substance and Sexual Activity   Alcohol use: Yes    Alcohol/week: 14.0 standard drinks of alcohol    Types: 14 Glasses of wine per week    Comment: 2 glasses wine/day   Drug use: No   Sexual activity: Yes  Other Topics Concern   Not on file  Social History Narrative   Married, retired   2 children   Former smoker no drug use,  does drink alcohol   Right handed   Social Drivers of Corporate investment banker Strain: Low Risk  (01/25/2024)   Overall Financial Resource Strain (CARDIA)    Difficulty of Paying Living Expenses: Not hard at all  Food Insecurity: No Food Insecurity (01/25/2024)   Hunger Vital Sign    Worried About Running Out of Food in the Last Year: Never true    Ran  Out of Food in the Last Year: Never true  Transportation Needs: No Transportation Needs (01/25/2024)   PRAPARE - Administrator, Civil Service (Medical): No    Lack of Transportation (Non-Medical): No  Physical Activity: Sufficiently Active (01/25/2024)   Exercise Vital Sign    Days of Exercise per Week: 3 days    Minutes of Exercise per Session: 50 min  Stress: No Stress Concern Present (01/25/2024)   [employer] of Occupational Health - Occupational Stress Questionnaire    Feeling of Stress : Not at all  Social Connections: Socially Integrated (01/25/2024)   Social Connection and Isolation Panel [NHANES]    Frequency of Communication with Friends and Family: More than three times a week    Frequency of Social Gatherings with Friends and Family: More than three times a week    Attends Religious Services: 1 to 4 times per year    Active Member of [employer] or Organizations: No    Attends Engineer, structural: 1 to 4 times per year    Marital Status: Married  Catering manager Violence: Not At Risk (03/09/2023)   Humiliation, Afraid, Rape, and Kick questionnaire    Fear of Current or Ex-Partner: No    Emotionally Abused: No    Physically Abused: No    Sexually Abused: No    Outpatient Medications Prior to Visit  Medication Sig Dispense Refill   amLODipine  (NORVASC ) 5 MG tablet Take 1 tablet (5 mg total) by mouth daily. (Patient taking differently: Take 2.5 mg by mouth daily.) 90 tablet 3   b complex vitamins tablet Take 1 tablet by mouth once a week.     clobetasol cream (TEMOVATE) 0.05 % Apply 1 Application  topically 2 (two) times daily as needed (Rash).     Coenzyme Q10 200 MG capsule Take 200 mg by mouth once a week.      hydrocortisone  2.5 % cream Apply 1 Application topically 2 (two) times daily.     metoprolol  succinate (TOPROL -XL) 25 MG 24 hr tablet Take 0.5 tablets (12.5 mg total) by mouth daily. Take an extra metoprolol  daily as needed. 90 tablet 2   Multiple Vitamin (MULTIVITAMIN WITH MINERALS) TABS tablet Take 1 tablet by mouth every other day.     pravastatin  (PRAVACHOL ) 40 MG tablet Take 1 tablet (40 mg total) by mouth every evening. (Patient taking differently: Take 20 mg by mouth every evening.) 90 tablet 3   triamcinolone cream (KENALOG) 0.1 % Apply 1 Application topically 2 (two) times daily.     famotidine  (PEPCID ) 20 MG tablet TAKE 1 TABLET BY MOUTH TWICE A DAY (Patient taking differently: Take 20 mg by mouth as needed for heartburn or indigestion.) 180 tablet 1   No facility-administered medications prior to visit.    Allergies  Allergen Reactions   Amoxicillin     REACTION: TROUBLE BREATHING Has patient had a PCN reaction causing immediate rash, facial/tongue/throat swelling, SOB or lightheadedness with hypotension:Yes Has patient had a PCN reaction causing severe rash involving mucus membranes or skin necrosis:No Has patient had a PCN reaction that required hospitalization:No Has patient had a PCN reaction occurring within the last 10 years:No If all of the above answers are "NO", then may proceed with Cephalosporin use.    Proair  Hfa [Albuterol ] Other (See Comments)    Hypersensitivity, anxious, disoriented   Penicillins Rash    Has patient had a PCN reaction causing immediate rash, facial/tongue/throat swelling, SOB or lightheadedness with hypotension:unknown Has patient had a  PCN reaction causing severe rash involving mucus membranes or skin necrosis:unknown Has patient had a PCN reaction that required hospitalization:no Has patient had a PCN reaction occurring within  the last 10 years:no If all of the above answers are "NO", then may proceed with Cephalosporin use.     Review of Systems  Constitutional:  Positive for malaise/fatigue. Negative for fever.  HENT:  Negative for congestion.   Eyes:  Negative for blurred vision.  Respiratory:  Negative for shortness of breath.   Cardiovascular:  Negative for chest pain, palpitations and leg swelling.  Gastrointestinal:  Positive for nausea. Negative for abdominal pain, blood in stool and vomiting.  Genitourinary:  Negative for dysuria and frequency.  Musculoskeletal:  Negative for falls and myalgias.  Skin:  Positive for rash.  Neurological:  Positive for weakness. Negative for dizziness, loss of consciousness and headaches.  Endo/Heme/Allergies:  Negative for environmental allergies.  Psychiatric/Behavioral:  Negative for depression. The patient is not nervous/anxious.        Objective:     Physical Exam Constitutional:      General: She is not in acute distress.    Appearance: Normal appearance. She is well-developed. She is not toxic-appearing.  HENT:     Head: Normocephalic and atraumatic.     Right Ear: External ear normal.     Left Ear: External ear normal.     Nose: Nose normal.  Eyes:     General:        Right eye: No discharge.        Left eye: No discharge.     Conjunctiva/sclera: Conjunctivae normal.  Neck:     Thyroid : No thyromegaly.  Cardiovascular:     Rate and Rhythm: Normal rate and regular rhythm.     Heart sounds: Normal heart sounds. No murmur heard. Pulmonary:     Effort: Pulmonary effort is normal. No respiratory distress.     Breath sounds: Normal breath sounds.  Abdominal:     General: Bowel sounds are normal.     Palpations: Abdomen is soft.     Tenderness: There is no abdominal tenderness. There is no guarding.  Musculoskeletal:        General: Normal range of motion.     Cervical back: Neck supple.  Lymphadenopathy:     Cervical: No cervical adenopathy.   Skin:    General: Skin is warm and dry.  Neurological:     Mental Status: She is alert and oriented to person, place, and time.  Psychiatric:        Mood and Affect: Mood normal.        Behavior: Behavior normal.        Thought Content: Thought content normal.        Judgment: Judgment normal.     BP 134/86   Pulse 82   Resp 16   Ht 5\' 6"  (1.676 m)   Wt 222 lb 3.2 oz (100.8 kg)   SpO2 95%   BMI 35.86 kg/m  Wt Readings from Last 3 Encounters:  01/26/24 222 lb 3.2 oz (100.8 kg)  11/30/23 223 lb (101.2 kg)  08/30/23 223 lb 12.8 oz (101.5 kg)    Diabetic Foot Exam - Simple   No data filed    Lab Results  Component Value Date   WBC 6.9 07/26/2023   HGB 15.3 (H) 07/26/2023   HCT 45.4 07/26/2023   PLT 249.0 07/26/2023   GLUCOSE 108 (H) 07/26/2023   CHOL 205 (H) 01/05/2024  TRIG 132 01/05/2024   HDL 69 01/05/2024   LDLDIRECT 148.6 09/26/2008   LDLCALC 113 (H) 01/05/2024   ALT 25 01/05/2024   AST 21 01/05/2024   NA 136 07/26/2023   K 4.4 07/26/2023   CL 104 07/26/2023   CREATININE 0.72 07/26/2023   BUN 15 07/26/2023   CO2 25 07/26/2023   TSH 1.73 07/26/2023   HGBA1C 6.0 07/26/2023    Lab Results  Component Value Date   TSH 1.73 07/26/2023   Lab Results  Component Value Date   WBC 6.9 07/26/2023   HGB 15.3 (H) 07/26/2023   HCT 45.4 07/26/2023   MCV 102.7 (H) 07/26/2023   PLT 249.0 07/26/2023   Lab Results  Component Value Date   NA 136 07/26/2023   K 4.4 07/26/2023   CO2 25 07/26/2023   GLUCOSE 108 (H) 07/26/2023   BUN 15 07/26/2023   CREATININE 0.72 07/26/2023   BILITOT 0.8 07/26/2023   ALKPHOS 81 07/26/2023   AST 21 01/05/2024   ALT 25 01/05/2024   PROT 6.9 07/26/2023   ALBUMIN 4.0 07/26/2023   CALCIUM  9.0 07/26/2023   ANIONGAP 6 02/10/2017   GFR 80.61 07/26/2023   Lab Results  Component Value Date   CHOL 205 (H) 01/05/2024   Lab Results  Component Value Date   HDL 69 01/05/2024   Lab Results  Component Value Date   LDLCALC 113  (H) 01/05/2024   Lab Results  Component Value Date   TRIG 132 01/05/2024   Lab Results  Component Value Date   CHOLHDL 3.0 01/05/2024   Lab Results  Component Value Date   HGBA1C 6.0 07/26/2023       Assessment & Plan:  Essential hypertension Assessment & Plan: Well controlled, no changes to meds. Encouraged heart healthy diet such as the DASH diet and exercise as tolerated.    Orders: -     Comprehensive metabolic panel with GFR -     CBC with Differential/Platelet -     TSH  Hyperglycemia Assessment & Plan: hgba1c acceptable, minimize simple carbs. Increase exercise as tolerated.   Orders: -     Hemoglobin A1c  Hyperlipidemia, mixed Assessment & Plan: Encourage heart healthy diet such as MIND or DASH diet, increase exercise, avoid trans fats, simple carbohydrates and processed foods, consider a krill or fish or flaxseed oil cap daily.     Neuromuscular disorder [facility]) Assessment & Plan: Long standing but now with increasing sense of disequilibrium, hearing loss. Was referred to neurology for evaluation.   Myalgia Assessment & Plan: Hydrate and monitor    Vitamin D  deficiency Assessment & Plan: Supplement and monitor   Orders: -     VITAMIN D  25 Hydroxy (Vit-D Deficiency, Fractures)  Gastroesophageal reflux disease, unspecified whether esophagitis present -     Famotidine ; Take 1 tablet (20 mg total) by mouth at bedtime as needed for heartburn or indigestion.  Dispense: 90 tablet; Refill: 1  Esophageal pain -     Famotidine ; Take 1 tablet (20 mg total) by mouth at bedtime as needed for heartburn or indigestion.  Dispense: 90 tablet; Refill: 1  Dysphagia, unspecified type -     Famotidine ; Take 1 tablet (20 mg total) by mouth at bedtime as needed for heartburn or indigestion.  Dispense: 90 tablet; Refill: 1    Assessment and Plan Assessment & Plan Gastroesophageal Reflux Disease (GERD) Nausea improved after reducing statin dose. Famotidine  used as  needed indicates GERD component. - Renew famotidine  prescription: 1 tablet  at bedtime as needed, 90 tablets with a refill.  Hyperlipidemia Pravastatin  dose reduced to 20 mg due to nausea. Lipid levels improved on higher dose but side effects intolerable. Frustration with communication noted. - Continue pravastatin  at 20 mg daily. - Schedule blood work in July to monitor lipid levels. - Consider further dose adjustment based on tolerance and lipid levels.  Hypertension On metoprolol  and amlodipine . Not monitoring blood pressure at [name] due to faulty machine. No side effects from amlodipine . - Continue metoprolol  and amlodipine  as prescribed. - Recommend obtaining a reliable [name] blood pressure monitor and bring it to the clinic for calibration.  Plantar Warts Treated with topical medication. Considering podiatrist for further management. - Refer to Dr. Sikora, a podiatrist at [facility], for further evaluation and management of plantar warts.  General Health Maintenance Considering shingles, RSV, and pneumonia vaccines. Reassured shingles vaccine is safe despite plantar warts. - Recommend receiving shingles, RSV, and pneumonia vaccines at the clinic's walk-in vaccine service.  Follow-up Scheduled cardiologist visit and blood work in July. Planning gastroenterologist evaluation for GI symptoms. - Follow up with cardiologist in July. - Schedule blood work in July. - Plan to see a gastroenterologist for further evaluation of gastrointestinal symptoms.      Randie Bustle, MD

## 2024-01-26 NOTE — Patient Instructions (Signed)
 Shingrix is the new shingles shot, 2 shots over 2-6 months, confirm coverage with insurance and document, then can return here for shots with nurse appt or at pharmacy  RSV, Respiratory Syncitial Virus Vaccine, Arexvy vaccine Prevnar 20 vaccine at pharmacy  Hypertension, Adult High blood pressure (hypertension) is when the force of blood pumping through the arteries is too strong. The arteries are the blood vessels that carry blood from the heart throughout the body. Hypertension forces the heart to work harder to pump blood and may cause arteries to become narrow or stiff. Untreated or uncontrolled hypertension can lead to a heart attack, heart failure, a stroke, kidney disease, and other problems. A blood pressure reading consists of a higher number over a lower number. Ideally, your blood pressure should be below 120/80. The first ("top") number is called the systolic pressure. It is a measure of the pressure in your arteries as your heart beats. The second ("bottom") number is called the diastolic pressure. It is a measure of the pressure in your arteries as the heart relaxes. What are the causes? The exact cause of this condition is not known. There are some conditions that result in high blood pressure. What increases the risk? Certain factors may make you more likely to develop high blood pressure. Some of these risk factors are under your control, including: Smoking. Not getting enough exercise or physical activity. Being overweight. Having too much fat, sugar, calories, or salt (sodium) in your diet. Drinking too much alcohol. Other risk factors include: Having a personal history of heart disease, diabetes, high cholesterol, or kidney disease. Stress. Having a family history of high blood pressure and high cholesterol. Having obstructive sleep apnea. Age. The risk increases with age. What are the signs or symptoms? High blood pressure may not cause symptoms. Very high blood pressure  (hypertensive crisis) may cause: Headache. Fast or irregular heartbeats (palpitations). Shortness of breath. Nosebleed. Nausea and vomiting. Vision changes. Severe chest pain, dizziness, and seizures. How is this diagnosed? This condition is diagnosed by measuring your blood pressure while you are seated, with your arm resting on a flat surface, your legs uncrossed, and your feet flat on the floor. The cuff of the blood pressure monitor will be placed directly against the skin of your upper arm at the level of your heart. Blood pressure should be measured at least twice using the same arm. Certain conditions can cause a difference in blood pressure between your right and left arms. If you have a high blood pressure reading during one visit or you have normal blood pressure with other risk factors, you may be asked to: Return on a different day to have your blood pressure checked again. Monitor your blood pressure at home for 1 week or longer. If you are diagnosed with hypertension, you may have other blood or imaging tests to help your health care provider understand your overall risk for other conditions. How is this treated? This condition is treated by making healthy lifestyle changes, such as eating healthy foods, exercising more, and reducing your alcohol intake. You may be referred for counseling on a healthy diet and physical activity. Your health care provider may prescribe medicine if lifestyle changes are not enough to get your blood pressure under control and if: Your systolic blood pressure is above 130. Your diastolic blood pressure is above 80. Your personal target blood pressure may vary depending on your medical conditions, your age, and other factors. Follow these instructions at home: Eating and drinking  Eat a diet that is high in fiber and potassium, and low in sodium, added sugar, and fat. An example of this eating plan is called the DASH diet. DASH stands for Dietary  Approaches to Stop Hypertension. To eat this way: Eat plenty of fresh fruits and vegetables. Try to fill one half of your plate at each meal with fruits and vegetables. Eat whole grains, such as whole-wheat pasta, brown rice, or whole-grain bread. Fill about one fourth of your plate with whole grains. Eat or drink low-fat dairy products, such as skim milk or low-fat yogurt. Avoid fatty cuts of meat, processed or cured meats, and poultry with skin. Fill about one fourth of your plate with lean proteins, such as fish, chicken without skin, beans, eggs, or tofu. Avoid pre-made and processed foods. These tend to be higher in sodium, added sugar, and fat. Reduce your daily sodium intake. Many people with hypertension should eat less than 1,500 mg of sodium a day. Do not drink alcohol if: Your health care provider tells you not to drink. You are pregnant, may be pregnant, or are planning to become pregnant. If you drink alcohol: Limit how much you have to: 0-1 drink a day for women. 0-2 drinks a day for men. Know how much alcohol is in your drink. In the [place] one drink equals one 12 oz bottle of beer (355 mL), one 5 oz glass of wine (148 mL), or one 1 oz glass of hard liquor (44 mL). Lifestyle  Work with your health care provider to maintain a healthy body weight or to lose weight. Ask what an ideal weight is for you. Get at least 30 minutes of exercise that causes your heart to beat faster (aerobic exercise) most days of the week. Activities may include walking, swimming, or biking. Include exercise to strengthen your muscles (resistance exercise), such as Pilates or lifting weights, as part of your weekly exercise routine. Try to do these types of exercises for 30 minutes at least 3 days a week. Do not use any products that contain nicotine or tobacco. These products include cigarettes, chewing tobacco, and vaping devices, such as e-cigarettes. If you need help quitting, ask your health care  provider. Monitor your blood pressure at home as told by your health care provider. Keep all follow-up visits. This is important. Medicines Take over-the-counter and prescription medicines only as told by your health care provider. Follow directions carefully. Blood pressure medicines must be taken as prescribed. Do not skip doses of blood pressure medicine. Doing this puts you at risk for problems and can make the medicine less effective. Ask your health care provider about side effects or reactions to medicines that you should watch for. Contact a health care provider if you: Think you are having a reaction to a medicine you are taking. Have headaches that keep coming back (recurring). Feel dizzy. Have swelling in your ankles. Have trouble with your vision. Get help right away if you: Develop a severe headache or confusion. Have unusual weakness or numbness. Feel faint. Have severe pain in your chest or abdomen. Vomit repeatedly. Have trouble breathing. These symptoms may be an emergency. Get help right away. Call 911. Do not wait to see if the symptoms will go away. Do not drive yourself to the hospital. Summary Hypertension is when the force of blood pumping through your arteries is too strong. If this condition is not controlled, it may put you at risk for serious complications. Your personal target blood pressure may  vary depending on your medical conditions, your age, and other factors. For most people, a normal blood pressure is less than 120/80. Hypertension is treated with lifestyle changes, medicines, or a combination of both. Lifestyle changes include losing weight, eating a healthy, low-sodium diet, exercising more, and limiting alcohol. This information is not intended to replace advice given to you by your health care provider. Make sure you discuss any questions you have with your health care provider. Document Revised: 06/16/2021 Document Reviewed: 06/16/2021 Elsevier  Patient Education  2024 [employer].

## 2024-01-27 LAB — COMPREHENSIVE METABOLIC PANEL WITH GFR
ALT: 23 U/L (ref 0–35)
AST: 22 U/L (ref 0–37)
Albumin: 4.3 g/dL (ref 3.5–5.2)
Alkaline Phosphatase: 85 U/L (ref 39–117)
BUN: 14 mg/dL (ref 6–23)
CO2: 23 meq/L (ref 19–32)
Calcium: 9.1 mg/dL (ref 8.4–10.5)
Chloride: 101 meq/L (ref 96–112)
Creatinine, Ser: 0.96 mg/dL (ref 0.40–1.20)
GFR: 56.88 mL/min — ABNORMAL LOW (ref >60.00)
Glucose, Bld: 108 mg/dL — ABNORMAL HIGH (ref 70–99)
Potassium: 4.8 meq/L (ref 3.5–5.1)
Sodium: 138 meq/L (ref 135–145)
Total Bilirubin: 0.5 mg/dL (ref 0.2–1.2)
Total Protein: 7.2 g/dL (ref 6.0–8.3)

## 2024-01-27 LAB — VITAMIN D 25 HYDROXY (VIT D DEFICIENCY, FRACTURES): VITD: 24.68 ng/mL — ABNORMAL LOW (ref 30.00–100.00)

## 2024-01-28 ENCOUNTER — Ambulatory Visit: Payer: Self-pay | Admitting: Family Medicine

## 2024-01-28 DIAGNOSIS — N289 Disorder of kidney and ureter, unspecified: Secondary | ICD-10-CM

## 2024-01-30 MED ORDER — VITAMIN D (ERGOCALCIFEROL) 1.25 MG (50000 UNIT) PO CAPS: 50000.0000 [IU] | ORAL_CAPSULE | ORAL | 0 refills | Status: DC

## 2024-02-09 ENCOUNTER — Other Ambulatory Visit: Payer: Self-pay | Admitting: Cardiology

## 2024-02-16 ENCOUNTER — Encounter: Payer: Self-pay | Admitting: Cardiology

## 2024-02-16 MED ORDER — PRAVASTATIN SODIUM 20 MG PO TABS: 20.0000 mg | ORAL_TABLET | Freq: Every evening | ORAL | 3 refills | Status: DC

## 2024-02-16 MED ORDER — AMLODIPINE BESYLATE 2.5 MG PO TABS: 2.5000 mg | ORAL_TABLET | Freq: Every day | ORAL | 3 refills | Status: DC

## 2024-02-29 ENCOUNTER — Other Ambulatory Visit: Payer: Self-pay

## 2024-02-29 DIAGNOSIS — R42 Dizziness and giddiness: Secondary | ICD-10-CM | POA: Insufficient documentation

## 2024-03-01 ENCOUNTER — Encounter: Payer: Self-pay | Admitting: Cardiology

## 2024-03-01 ENCOUNTER — Other Ambulatory Visit (INDEPENDENT_AMBULATORY_CARE_PROVIDER_SITE_OTHER)

## 2024-03-01 ENCOUNTER — Ambulatory Visit: Attending: Cardiology | Admitting: Cardiology

## 2024-03-01 ENCOUNTER — Ambulatory Visit: Payer: Self-pay | Admitting: Family Medicine

## 2024-03-01 VITALS — BP 158/88 | HR 82 | Ht 66.6 in | Wt 220.0 lb

## 2024-03-01 DIAGNOSIS — I10 Essential (primary) hypertension: Secondary | ICD-10-CM

## 2024-03-01 DIAGNOSIS — R0609 Other forms of dyspnea: Secondary | ICD-10-CM | POA: Diagnosis not present

## 2024-03-01 DIAGNOSIS — N289 Disorder of kidney and ureter, unspecified: Secondary | ICD-10-CM

## 2024-03-01 DIAGNOSIS — R931 Abnormal findings on diagnostic imaging of heart and coronary circulation: Secondary | ICD-10-CM | POA: Diagnosis not present

## 2024-03-01 LAB — COMPREHENSIVE METABOLIC PANEL WITH GFR
ALT: 20 U/L (ref 0–35)
AST: 20 U/L (ref 0–37)
Albumin: 4.2 g/dL (ref 3.5–5.2)
Alkaline Phosphatase: 79 U/L (ref 39–117)
BUN: 10 mg/dL (ref 6–23)
CO2: 27 meq/L (ref 19–32)
Calcium: 9 mg/dL (ref 8.4–10.5)
Chloride: 102 meq/L (ref 96–112)
Creatinine, Ser: 0.75 mg/dL (ref 0.40–1.20)
GFR: 76.44 mL/min (ref >60.00)
Glucose, Bld: 109 mg/dL — ABNORMAL HIGH (ref 70–99)
Potassium: 4.8 meq/L (ref 3.5–5.1)
Sodium: 137 meq/L (ref 135–145)
Total Bilirubin: 0.9 mg/dL (ref 0.2–1.2)
Total Protein: 7 g/dL (ref 6.0–8.3)

## 2024-03-01 MED ORDER — ROSUVASTATIN CALCIUM 5 MG PO TABS: 5.0000 mg | ORAL_TABLET | Freq: Every day | ORAL | 3 refills | Status: DC

## 2024-03-01 MED ORDER — METOPROLOL SUCCINATE ER 25 MG PO TB24: 25.0000 mg | ORAL_TABLET | Freq: Every day | ORAL | 3 refills | Status: AC

## 2024-03-01 NOTE — Patient Instructions (Addendum)
 Medication Instructions:   STOP: Pravastatin   START: Rosuvastatin  5mg  every other day  INCREASE: Metoprolol  Succinate to 25mg  daily   Lab Work: None Ordered If you have labs (blood work) drawn today and your tests are completely normal, you will receive your results only by: MyChart Message (if you have MyChart) OR A paper copy in the mail If you have any lab test that is abnormal or we need to change your treatment, we will call you to review the results.   Testing/Procedures: Your physician has requested that you have a lexiscan myoview. For further information please visit [email or web address] Please follow instruction sheet, as given.  The test will take approximately 3 to 4 hours to complete; you may bring reading material.  If someone comes with you to your appointment, they will need to remain in the main lobby due to limited space in the testing area.    How to prepare for your Myocardial Perfusion Test: Do not eat or drink 3 hours prior to your test, except you may have water. Do not consume products containing caffeine (regular or decaffeinated) 12 hours prior to your test. (ex: coffee, chocolate, sodas, tea). Do bring a list of your current medications with you.  If not listed below, you may take your medications as normal. Do wear comfortable clothes (no dresses or overalls) and walking shoes, tennis shoes preferred (No heels or open toe shoes are allowed). Do NOT wear cologne, perfume, aftershave, or lotions (deodorant is allowed). If these instructions are not followed, your test will have to be rescheduled.     Follow-Up: At [facility], you and your health needs are our priority.  As part of our continuing mission to provide you with exceptional heart care, we have created designated Provider Care Teams.  These Care Teams include your primary Cardiologist (physician) and Advanced Practice Providers (APPs -  Physician Assistants and Nurse Practitioners) who all work  together to provide you with the care you need, when you need it.  We recommend signing up for the patient portal called MyChart.  Sign up information is provided on this After Visit Summary.  MyChart is used to connect with patients for Virtual Visits (Telemedicine).  Patients are able to view lab/test results, encounter notes, upcoming appointments, etc.  Non-urgent messages can be sent to your provider as well.   To learn more about what you can do with MyChart, go to [email or web address].    Your next appointment:   3 month(s)  The format for your next appointment:   In Person  Provider:   Lamar Fitch, MD    Other Instructions NA

## 2024-03-01 NOTE — Addendum Note (Signed)
 Addended by: ARLOA PLANAS D on: 03/01/2024 11:55 AM   Modules accepted: Orders

## 2024-03-01 NOTE — Progress Notes (Signed)
 Cardiology Office Note:    Date:  03/01/2024   ID:  Renee [name], DOB [birth year], MRN [id number]  PCP:  Domenica Harlene LABOR, MD  Cardiologist:  Lamar Fitch, MD    Referring MD: Domenica Harlene LABOR, MD   No chief complaint on file.   History of Present Illness:    Renee [name] is a 78 y.o. female past medical history significant for dyslipidemia elevated calcium  score of 438 which is 81st percentile, PVCs, atypical chest pain, dyspnea exertion.  She comes today for follow-up.  She was complaining of having shortness of breath with some chest pain.  Echocardiogram has been done which showed preserved ejection fraction no significant valvular pathology, impair exertion, she supposed to have a stress test done but she canceled the test.  Last time I put her on amlodipine  seems to be doing better after that.  She did have difficulty regulating pravastatin  and she does have some strange side effects to this medication she cut down to only every other day seems to be doing quite well with this but still unhappy about the medication.  Overall feels better shortness of breath improved denies having any chest pain tightness squeezing pressure burning chest  Past Medical History:  Diagnosis Date   Abdominal pain 07/05/2017   Abnormal involuntary movement 01/01/2009   Qualifier: Diagnosis of   By: Tish MD, Elsie SCULL SNOMED Dx Update Oct 2024     Allergic rhinitis 09/19/2008   Qualifier: Diagnosis of  By: Georgian ROSALEA CHARM Lamar    Allergy    Arthralgia of right wrist 05/06/2020   Atypical chest pain 09/19/2008   Qualifier: Diagnosis of  By: Georgian ROSALEA CHARM Lamar    Cervical cancer screening 05/08/2013   Menarche 11, regular Menopause 50s G2P2 s/p 2 svd No abnormal paps or MGM Fatty cyst on labia removed.    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 Fixed sigmoid colon prevents. Referred for surgery.   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 02/09/2017   Tinnitus 12/12/2017   Tremor, essential    Unsteady gait 05/11/2023   Vertigo    Vertigo    Vitamin D  deficiency 07/29/2022    Past Surgical History:  Procedure Laterality Date   INNER EAR SURGERY  2015   with tube   MOUTH SURGERY  01/22/11   lower denture secured by titanium bolts   rectal suction biospy     XI ROBOTIC ASSISTED LOWER ANTERIOR RESECTION N/A 02/09/2017   Procedure: XI ROBOTIC RESECTION OF SIGMOID COLON, RIGID PROCTOSCOPY;  Surgeon: Sheldon Standing, MD;  Location: [facility];  Service: General;  Laterality: N/A;    Current Medications: Current Meds  Medication Sig    amLODipine  (NORVASC ) 2.5 MG tablet Take 1 tablet (2.5 mg total) by mouth daily.   b complex vitamins tablet Take 1 tablet by mouth once a week.   clobetasol cream (TEMOVATE) 0.05 % Apply 1 Application topically 2 (two) times daily as needed (Rash).   Coenzyme Q10 200 MG capsule Take 200 mg by mouth once a week.    famotidine  (PEPCID ) 20 MG tablet Take 1 tablet (20 mg total) by mouth at bedtime as needed for heartburn or indigestion.   hydrocortisone  2.5 % cream Apply 1 Application topically 2 (two) times daily.   metoprolol  succinate (TOPROL -XL) 25 MG 24 hr tablet TAKE 0.5 TABLETS (12.5 MG TOTAL) BY MOUTH DAILY. TAKE AN EXTRA METOPROLOL  DAILY AS NEEDED.   Multiple Vitamin (MULTIVITAMIN WITH MINERALS) TABS tablet Take 1 tablet by mouth every other day.   pravastatin  (PRAVACHOL ) 20 MG tablet Take 1 tablet (20 mg total) by mouth every evening. (Patient taking differently: Take 20 mg by mouth every other day.)   triamcinolone cream (KENALOG) 0.1 % Apply 1 Application topically 2 (two) times daily.   Vitamin D , Ergocalciferol , (DRISDOL ) 1.25 MG (50000 UNIT) CAPS capsule Take 1 capsule (50,000 Units total) by mouth every 7 (seven) days. For 12 weeks     Allergies:   Amoxicillin, Proair  hfa [albuterol ], and Penicillins   Social History   Socioeconomic History   Marital status: Married    Spouse name: Lamar   Number of children: 2   Years of education: Not on file   Highest education level: Some college, no degree  Occupational History   Occupation: retired    Comment: Photographer  Tobacco Use   Smoking status: Former    Current packs/day: 0.00    Average packs/day: 0.5 packs/day for 53.0 years (26.5 ttl pk-yrs)    Types: Cigarettes    Start date: 1964    Quit date: 09/07/2015    Years since quitting: 8.4   Smokeless tobacco: Never  Vaping Use   Vaping status: Never Used  Substance and Sexual Activity   Alcohol use: Yes    Alcohol/week: 14.0 standard drinks of alcohol    Types: 14 Glasses  of wine per week    Comment: 2 glasses wine/day   Drug use: No   Sexual activity: Yes  Other Topics Concern   Not on file  Social History Narrative   Married, retired   2 children   Former smoker no drug use, does drink alcohol   Right handed   Social Drivers of Corporate investment banker Strain: Low Risk  (01/25/2024)   Overall Financial Resource Strain (CARDIA)    Difficulty of Paying Living Expenses: Not hard at all  Food Insecurity: No Food Insecurity (01/25/2024)   Hunger Vital Sign  Worried About Programme researcher, broadcasting/film/video in the Last Year: Never true    Ran Out of Food in the Last Year: Never true  Transportation Needs: No Transportation Needs (01/25/2024)   PRAPARE - Administrator, Civil Service (Medical): No    Lack of Transportation (Non-Medical): No  Physical Activity: Sufficiently Active (01/25/2024)   Exercise Vital Sign    Days of Exercise per Week: 3 days    Minutes of Exercise per Session: 50 min  Stress: No Stress Concern Present (01/25/2024)   [employer] of Occupational Health - Occupational Stress Questionnaire    Feeling of Stress : Not at all  Social Connections: Socially Integrated (01/25/2024)   Social Connection and Isolation Panel    Frequency of Communication with Friends and Family: More than three times a week    Frequency of Social Gatherings with Friends and Family: More than three times a week    Attends Religious Services: 1 to 4 times per year    Active Member of [employer] or Organizations: No    Attends Engineer, structural: 1 to 4 times per year    Marital Status: Married     Family History: The patient's family history includes Alcohol abuse in her mother; Diabetes in her daughter; Heart attack (age of onset: 65) in her father; Heart disease in her paternal grandfather and paternal grandmother; Hypertension in her mother; Obesity in her daughter; Stroke in her mother; Tremor in her mother. There is no history of Colon cancer,  Breast cancer, Prostate cancer, Liver cancer, Rectal cancer, or Stomach cancer. ROS:   Please see the history of present illness.    All 14 point review of systems negative except as described per history of present illness  EKGs/Labs/Other Studies Reviewed:         Recent Labs: 01/26/2024: ALT 23; BUN 14; Creatinine, Ser 0.96; Hemoglobin 15.4; Platelets 242.0; Potassium 4.8; Sodium 138; TSH 1.70  Recent Lipid Panel    Component Value Date/Time   CHOL 205 (H) 01/05/2024 0900   TRIG 132 01/05/2024 0900   HDL 69 01/05/2024 0900   CHOLHDL 3.0 01/05/2024 0900   CHOLHDL 3 07/26/2023 1050   VLDL 26.6 07/26/2023 1050   LDLCALC 113 (H) 01/05/2024 0900   LDLCALC 139 (H) 04/24/2020 0912   LDLDIRECT 148.6 09/26/2008 0811    Physical Exam:    VS:  BP (!) 158/88   Pulse 82   Ht 5' 6.6 (1.692 m)   Wt 220 lb (99.8 kg)   SpO2 94%   BMI 34.87 kg/m     Wt Readings from Last 3 Encounters:  03/01/24 220 lb (99.8 kg)  01/26/24 222 lb 3.2 oz (100.8 kg)  11/30/23 223 lb (101.2 kg)     GEN:  Well nourished, well developed in no acute distress HEENT: Normal NECK: No JVD; No carotid bruits LYMPHATICS: No lymphadenopathy CARDIAC: RRR, no murmurs, no rubs, no gallops RESPIRATORY:  Clear to auscultation without rales, wheezing or rhonchi  ABDOMEN: Soft, non-tender, non-distended MUSCULOSKELETAL:  No edema; No deformity  SKIN: Warm and dry LOWER EXTREMITIES: no swelling NEUROLOGIC:  Alert and oriented x 3 PSYCHIATRIC:  Normal affect   ASSESSMENT:    1. 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 Essential hypertension   3. Dyspnea on exertion    PLAN:    In order of problems listed above:  Elevated calcium  score.  I convinced her to have a stress test done we  will try to do exercise Cardiolite, if she cannot do it then will be able to be for medication with Lexiscan.  She overall does not like medications hopefully should be able to do it. Dyslipidemia  interestingly she pull out from her back messages that her daughter take rosuvastatin  she would like to try that medication she would like to try 5 mg of rosuvastatin  every other day which I am fine to try PERRLA last LDL was 114 which is clearly not well-controlled. Essential hypertension blood pressure seems to be slightly elevated today I will increase dose of metoprolol  to 25 mg daily.   Medication Adjustments/Labs and Tests Ordered: Current medicines are reviewed at length with the patient today.  Concerns regarding medicines are outlined above.  No orders of the defined types were placed in this encounter.  Medication changes: No orders of the defined types were placed in this encounter.   Signed, Lamar DOROTHA Fitch, MD, [facility] 03/01/2024 11:20 AM    [facility]

## 2024-03-02 NOTE — Progress Notes (Signed)
Patient reviewed via MyChart.

## 2024-03-08 ENCOUNTER — Encounter: Payer: Self-pay | Admitting: Cardiology

## 2024-03-14 ENCOUNTER — Telehealth: Payer: Self-pay | Admitting: *Deleted

## 2024-03-14 ENCOUNTER — Ambulatory Visit (INDEPENDENT_AMBULATORY_CARE_PROVIDER_SITE_OTHER): Payer: Medicare Other | Admitting: *Deleted

## 2024-03-14 VITALS — Ht 66.0 in | Wt 222.0 lb

## 2024-03-14 DIAGNOSIS — Z0000 Encounter for general adult medical examination without abnormal findings: Secondary | ICD-10-CM | POA: Diagnosis not present

## 2024-03-14 NOTE — Telephone Encounter (Signed)
 PT had AWV today.  She mentioned bilateral foot swelling and is concerned this is due to the Amlodipine . She reports stopping the medication for 1 week and has noticed improvement of the swelling. Reports BPs during this time have averaged in the 140s/80s. I advised pt to reach out to cardiology for further direction and she is agreeable. I have also copied cardiology in on this message.

## 2024-03-14 NOTE — Progress Notes (Signed)
 Please attest this visit in the absence of patient primary care provider.    Subjective:   Renee [name] is a 78 y.o. who presents for a Medicare Wellness preventive visit.  As a reminder, Annual Wellness Visits don't include a physical exam, and some assessments may be limited, especially if this visit is performed virtually. We may recommend an in-person follow-up visit with your provider if needed.  Visit Complete: Virtual I connected with  Renee [name] Ada on 03/14/24 by a audio enabled telemedicine application and verified that I am speaking with the correct person using two identifiers.  Patient Location: Home  Provider Location: Office/Clinic  I discussed the limitations of evaluation and management by telemedicine. The patient expressed understanding and agreed to proceed.  Vital Signs: Because this visit was a virtual/telehealth visit, some criteria may be missing or patient reported. Any vitals not documented were not able to be obtained and vitals that have been documented are patient reported.  VideoDeclined- This patient declined Librarian, academic. Therefore the visit was completed with audio only.  Persons Participating in Visit: Patient.  AWV Questionnaire: Yes: Patient Medicare AWV questionnaire was completed by the patient on 03/12/24; I have confirmed that all information answered by patient is correct and no changes since this date.  Cardiac Risk Factors include: advanced age (>1men, >24 women);dyslipidemia;hypertension     Objective:    Today's Vitals   03/14/24 1340  Weight: 222 lb (100.7 kg)  Height: 5' 6 (1.676 m)   Body mass index is 35.83 kg/m.     03/14/2024    2:05 PM 07/05/2023    9:40 AM 03/09/2023    1:58 PM 01/19/2022    1:58 PM 10/21/2021    8:37 AM 11/10/2020   10:48 AM 03/07/2020   11:54 AM  Advanced Directives  Does Patient Have a Medical Advance Directive? Yes Yes Yes Yes Yes No Yes  Type of Special educational needs teacher of [place];Living will Living will Healthcare Power of [place];Living will Healthcare Power of [place];Out of facility DNR (pink MOST or yellow form);Living will   Healthcare Power of [place];Living will  Does patient want to make changes to medical advance directive? Yes (MAU/Ambulatory/Procedural Areas - Information given)  No - Patient declined No - Patient declined   No - Patient declined  Copy of Healthcare Power of Attorney in Chart? No - copy requested  Yes - validated most recent copy scanned in chart (See row information) Yes - validated most recent copy scanned in chart (See row information)   Yes - validated most recent copy scanned in chart (See row information)  Would patient like information on creating a medical advance directive?      No - Patient declined     Current Medications (verified) Outpatient Encounter Medications as of 03/14/2024  Medication Sig   b complex vitamins tablet Take 1 tablet by mouth once a week.   Coenzyme Q10 200 MG capsule Take 200 mg by mouth once a week.    famotidine  (PEPCID ) 20 MG tablet Take 1 tablet (20 mg total) by mouth at bedtime as needed for heartburn or indigestion.   metoprolol  succinate (TOPROL -XL) 25 MG 24 hr tablet Take 1 tablet (25 mg total) by mouth daily. Take an extra metoprolol  daily as needed. (Patient taking differently: Take 12.5 mg by mouth daily. Take an extra metoprolol  daily as needed.)   Multiple Vitamin (MULTIVITAMIN WITH MINERALS) TABS tablet Take 1 tablet by mouth every other day.  Vitamin D , Ergocalciferol , (DRISDOL ) 1.25 MG (50000 UNIT) CAPS capsule Take 1 capsule (50,000 Units total) by mouth every 7 (seven) days. For 12 weeks   amLODipine  (NORVASC ) 2.5 MG tablet Take 1 tablet (2.5 mg total) by mouth daily. (Patient not taking: Reported on 03/14/2024)   clobetasol cream (TEMOVATE) 0.05 % Apply 1 Application topically 2 (two) times daily as needed (Rash). (Patient not taking: Reported on 03/14/2024)    hydrocortisone  2.5 % cream Apply 1 Application topically 2 (two) times daily. (Patient not taking: Reported on 03/14/2024)   rosuvastatin  (CRESTOR ) 5 MG tablet Take 1 tablet (5 mg total) by mouth daily. (Patient not taking: Reported on 03/14/2024)   triamcinolone cream (KENALOG) 0.1 % Apply 1 Application topically 2 (two) times daily. (Patient not taking: Reported on 03/14/2024)   No facility-administered encounter medications on file as of 03/14/2024.    Allergies (verified) Amoxicillin, Proair  hfa [albuterol ], and Penicillins   History: Past Medical History:  Diagnosis Date   Abdominal pain 07/05/2017   Abnormal involuntary movement 01/01/2009   Qualifier: Diagnosis of   By: Tish MD, Elsie SCULL SNOMED Dx Update Oct 2024     Allergic rhinitis 09/19/2008   Qualifier: Diagnosis of  By: Georgian ROSALEA CHARM Lamar    Allergy    Arthralgia of right wrist 05/06/2020   Atypical chest pain 09/19/2008   Qualifier: Diagnosis of  By: Georgian ROSALEA CHARM Lamar    Cervical cancer screening 05/08/2013   Menarche 11, regular Menopause 50s G2P2 s/p 2 svd No abnormal paps or MGM Fatty cyst on labia removed.    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   Qualifier: Diagnosis of  By: Georgian ROSALEA CHARM Lamar    Dermatitis 09/17/2018   Diverticulosis 05/06/2017   Dizziness    Dyspnea on exertion 11/30/2023   Essential hypertension 09/19/2008   Qualifier: Diagnosis of  By: Georgian ROSALEA CHARM Lamar    GERD (gastroesophageal reflux disease)    H/O osteopenia 02/08/2016   Head tremor 12/27/2016   Hearing loss in right ear 09/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 Fixed sigmoid colon prevents. Referred for surgery.   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 02/09/2017   Tinnitus 12/12/2017   Tremor, essential    Unsteady gait 05/11/2023   Vertigo    Vertigo    Vitamin D  deficiency 07/29/2022   Past Surgical History:  Procedure Laterality Date   INNER EAR SURGERY  2015   with tube   MOUTH SURGERY  01/22/11   lower denture secured by titanium bolts   rectal suction biospy     XI ROBOTIC ASSISTED LOWER ANTERIOR RESECTION N/A 02/09/2017   Procedure: XI ROBOTIC RESECTION OF SIGMOID COLON, RIGID PROCTOSCOPY;  Surgeon: Sheldon Standing, MD;  Location: [facility];  Service: General;  Laterality: N/A;   Family History  Problem Relation Age of Onset   Stroke Mother    Hypertension Mother    Alcohol abuse Mother    Tremor Mother    Heart attack Father 51       deceased   Diabetes Daughter    Obesity Daughter    Heart disease Paternal Grandmother        AAA rupture   Heart disease Paternal Grandfather        MI   Colon cancer Neg Hx    Breast cancer Neg Hx    Prostate cancer Neg Hx    Liver cancer Neg Hx    Rectal cancer Neg Hx    Stomach cancer Neg Hx    Social History   Socioeconomic History   Marital status: Married    Spouse name: Lamar   Number of children: 2   Years of education: Not on file   Highest education level: Some college, no degree   Occupational History   Occupation: retired    Comment: Photographer  Tobacco Use   Smoking status: Former    Current packs/day: 0.00    Average packs/day: 0.5 packs/day for 53.0 years (26.5 ttl pk-yrs)    Types: Cigarettes    Start date: 1964    Quit date: 09/07/2015    Years since quitting: 8.5   Smokeless tobacco: Never  Vaping Use   Vaping status: Never Used  Substance and Sexual Activity   Alcohol use: Yes    Alcohol/week: 14.0 standard drinks of alcohol    Types: 14 Glasses of wine per week    Comment: 2 glasses wine/day   Drug use: No   Sexual activity: Yes  Other Topics Concern   Not on file  Social History Narrative   Married, retired   2 children   Former smoker no drug use, does drink alcohol   Right handed   Social Drivers of Corporate investment banker Strain: Low Risk  (03/14/2024)   Overall Financial Resource Strain (CARDIA)    Difficulty of Paying Living Expenses: Not hard at all  Food Insecurity: No Food Insecurity (03/14/2024)   Hunger Vital Sign    Worried About Running Out of Food in the Last Year: Never true    Ran Out of Food in the Last Year: Never true  Transportation Needs: No Transportation Needs (03/14/2024)   PRAPARE - Administrator, Civil Service (Medical): No    Lack of Transportation (Non-Medical): No  Physical Activity: Sufficiently Active (03/14/2024)   Exercise Vital Sign    Days of Exercise per Week: 3 days    Minutes of Exercise per Session: 50 min  Stress: No Stress Concern Present (03/14/2024)   [employer] of Occupational Health - Occupational Stress Questionnaire    Feeling of Stress: Not at all  Social Connections: Socially Integrated (03/14/2024)   Social Connection and Isolation Panel    Frequency of Communication with Friends and Family: More than three times a week    Frequency of Social Gatherings with Friends and Family: More than three times a week    Attends Religious Services:  1 to 4 times per year     Active Member of Clubs or Organizations: Yes    Attends Engineer, structural: More than 4 times per year    Marital Status: Married    Tobacco Counseling Counseling given: Not Answered    Clinical Intake:  Pre-visit preparation completed: Yes  Pain : No/denies pain     BMI - recorded: 35.83 Nutritional Status: BMI > 30  Obese (pt is working on dietary changes) Nutritional Risks: None Diabetes: No  Lab Results  Component Value Date   HGBA1C 6.2 01/26/2024   HGBA1C 6.0 07/26/2023   HGBA1C 5.9 02/10/2023     How often do you need to have someone help you when you read instructions, pamphlets, or other written materials from your doctor or pharmacy?: 1 - Never What is the last grade level you completed in school?: some college  Interpreter Needed?: No  Information entered by :: Lolita Libra, CMA   Activities of Daily Living     03/12/2024   11:24 AM  In your present state of health, do you have any difficulty performing the following activities:  Hearing? 1  Comment has hearing aids  Vision? 0  Difficulty concentrating or making decisions? 0  Walking or climbing stairs? 0  Dressing or bathing? 0  Doing errands, shopping? 0  Preparing Food and eating ? N  Using the Toilet? N  In the past six months, have you accidently leaked urine? N  Do you have problems with loss of bowel control? N  Managing your Medications? N  Managing your Finances? N  Housekeeping or managing your Housekeeping? N    Patient Care Team: Domenica Harlene LABOR, MD as PCP - General (Family Medicine) Avram Lupita BRAVO, MD as Consulting Physician (Gastroenterology) Dr Alm Daughtry (Optometry) Sheldon Standing, MD as Consulting Physician (General Surgery) Love, Lynwood HERO, MD as Consulting Physician (Neurology) Bernie Lamar PARAS, MD as Consulting Physician (Cardiology) Hollar, Carlin Bullard, MD as Referring Physician (Dermatology)  I have updated your Care Teams any recent Medical  Services you may have received from other providers in the past year.     Assessment:   This is a routine wellness examination for [place].  Hearing/Vision screen Hearing Screening - Comments:: Has bilateral hearing aids with Darice Pitch @ [facility]  Vision Screening - Comments:: Wears reading glasses -- up to date with routine eye exams @ [facility]    Goals Addressed               This Visit's Progress     Weight (lb) < 200 lb (90.7 kg) (pt-stated)   222 lb (100.7 kg)     She wants to weigh less than 200       Depression Screen     03/14/2024    2:01 PM 08/30/2023    9:09 AM 05/10/2023    9:21 AM 03/09/2023    1:54 PM 02/01/2023    9:37 AM 07/29/2022    2:31 PM 01/19/2022    1:59 PM  PHQ 2/9 Scores  PHQ - 2 Score 0 0 0 0 0 0 0  PHQ- 9 Score 1   0 0      Fall Risk     03/12/2024   11:24 AM 08/30/2023    9:09 AM 07/05/2023    9:40 AM 05/10/2023    9:21 AM 03/08/2023   12:09 PM  Fall Risk   Falls in the past year? 0 0 0 0  0  Number falls in past yr: 0 0 0 0 0  Injury with Fall? 0 0 0 0 0  Risk for fall due to :  No Fall Risks   No Fall Risks  Follow up Education provided Falls evaluation completed Falls evaluation completed Falls evaluation completed Falls prevention discussed    MEDICARE RISK AT HOME:  Medicare Risk at Home Any stairs in or around the home?: (Patient-Rptd) Yes If so, are there any without handrails?: (Patient-Rptd) No Home free of loose throw rugs in walkways, pet beds, electrical cords, etc?: (Patient-Rptd) Yes Adequate lighting in your home to reduce risk of falls?: (Patient-Rptd) Yes Life alert?: (Patient-Rptd) No Use of a cane, walker or w/c?: (Patient-Rptd) No Grab bars in the bathroom?: (Patient-Rptd) Yes Shower chair or bench in shower?: (Patient-Rptd) No Elevated toilet seat or a handicapped toilet?: (Patient-Rptd) Yes  TIMED UP AND GO:  Was the test performed?  No, audio  Cognitive Function: 6CIT completed     10/07/2016   11:02 AM 10/03/2015    2:50 PM  MMSE - Mini Mental State Exam  Orientation to time 5  5   Orientation to Place 5  5   Registration 3  3   Attention/ Calculation 5  5   Recall 3  3   Language- name 2 objects 2  2   Language- repeat 1 1  Language- follow 3 step command 3  3   Language- read & follow direction 1  1   Write a sentence 1  1   Copy design 1  1   Total score 30  30      Data saved with a previous flowsheet row definition        03/14/2024    2:09 PM 03/09/2023    1:59 PM 01/19/2022    2:07 PM  6CIT Screen  What Year? 0 points 0 points 0 points  What month?  0 points 0 points  What time? 0 points 0 points 0 points  Count back from 20 0 points 0 points 0 points  Months in reverse 0 points 0 points 0 points  Repeat phrase 0 points 0 points 0 points  Total Score  0 points 0 points    Immunizations Immunization History  Administered Date(s) Administered   PFIZER(Purple Top)SARS-COV-2 Vaccination 10/01/2019, 10/24/2019, 06/26/2020   Pneumococcal Conjugate-13 07/06/2016   Pneumococcal Polysaccharide-23 04/24/2020    Screening Tests Health Maintenance  Topic Date Due   DTaP/Tdap/Td (1 - Tdap) Never done   Zoster Vaccines- Shingrix (1 of 2) Never done   Colonoscopy  01/18/2023   DEXA SCAN  08/12/2023   Medicare Annual Wellness (AWV)  03/08/2024   Hepatitis C Screening  08/29/2024 (Originally 01/31/1964)   INFLUENZA VACCINE  03/23/2024   MAMMOGRAM  01/04/2025   Pneumococcal Vaccine: 50+ Years  Completed   Hepatitis B Vaccines  Aged Out   HPV VACCINES  Aged Out   Meningococcal B Vaccine  Aged Out   Lung Cancer Screening  Discontinued   COVID-19 Vaccine  Discontinued    Health Maintenance  Health Maintenance Due  Topic Date Due   DTaP/Tdap/Td (1 - Tdap) Never done   Zoster Vaccines- Shingrix (1 of 2) Never done   Colonoscopy  01/18/2023   DEXA SCAN  08/12/2023   Medicare Annual Wellness (AWV)  03/08/2024   Health Maintenance Items  Addressed: Pt will get tetanus and Shingrix vaccines at pharmacy. Has consultation for colonoscopy scheduled for 8/28, Dexa scan 8/11  Additional Screening:  Vision Screening: Recommended annual ophthalmology exams for early detection of glaucoma and other disorders of the eye. Would you like a referral to an eye doctor? No    Dental Screening: Recommended annual dental exams for proper oral hygiene  Community Resource Referral / Chronic Care Management: CRR required this visit?  No   CCM required this visit?  No   Plan:    I have personally reviewed and noted the following in the patient's chart:   Medical and social history Use of alcohol, tobacco or illicit drugs  Current medications and supplements including opioid prescriptions. Patient is not currently taking opioid prescriptions. Functional ability and status Nutritional status Physical activity Advanced directives List of other physicians Hospitalizations, surgeries, and ER visits in previous 12 months Vitals Screenings to include cognitive, depression, and falls Referrals and appointments  In addition, I have reviewed and discussed with patient certain preventive protocols, quality metrics, and best practice recommendations. A written personalized care plan for preventive services as well as general preventive health recommendations were provided to patient.   Lolita Libra, CMA   03/14/2024   After Visit Summary: (MyChart) Due to this being a telephonic visit, the after visit summary with patients personalized plan was offered to patient via MyChart   Notes: See phone note.

## 2024-03-14 NOTE — Patient Instructions (Signed)
 Renee [name] , Thank you for taking time out of your busy schedule to complete your Annual Wellness Visit with me. I enjoyed our conversation and look forward to speaking with you again next year. I, as well as your care team,  appreciate your ongoing commitment to your health goals. Please review the following plan we discussed and let me know if I can assist you in the future. Your Game plan/ To Do List    Follow up Visits: Next Medicare AWV with our clinical staff: 03/15/25 1pm    Next Office Visit with your provider: 08/06/24 10am  Clinician Recommendations:  Aim for 30 minutes of exercise or brisk walking, 6-8 glasses of water, and 5 servings of fruits and vegetables each day.   You will need to get the following vaccines at your local pharmacy: tetanus and Shingrix (shingles).  Please call your cardiologist about the swelling in your feet and your amlodipine  adjustment.  I attached some information for you to review about a healthy plate to assist you with your weight loss efforts. Keep up the good work. Make small changes at a time and don't give up!     This is a list of the screening recommended for you and due dates:  Health Maintenance  Topic Date Due   DTaP/Tdap/Td vaccine (1 - Tdap) Never done   Zoster (Shingles) Vaccine (1 of 2) Never done   Colon Cancer Screening  01/18/2023   DEXA scan (bone density measurement)  08/12/2023   Medicare Annual Wellness Visit  03/08/2024   Hepatitis C Screening  08/29/2024*   Flu Shot  03/23/2024   Mammogram  01/04/2025   Pneumococcal Vaccine for age over 21  Completed   Hepatitis B Vaccine  Aged Out   HPV Vaccine  Aged Out   Meningitis B Vaccine  Aged Out   Screening for Lung Cancer  Discontinued   COVID-19 Vaccine  Discontinued  *Topic was postponed. The date shown is not the original due date.   Please let me know if you don't receive the advanced directive packet within 1 week. Once completed, you may send us  a copy by either of the  following below:  Advanced directives: (Copy Requested) Please bring a copy of your health care power of attorney and living will to the office to be added to your chart at your convenience. You can mail to [facility] [address] [place], [place] [address] or email to [email or web address] Advance Care Planning is important because it:   See attachments for Preventive Care and Fall Prevention Tips.

## 2024-03-21 NOTE — Telephone Encounter (Signed)
 LVM to call regarding feet swelling and changing medications.

## 2024-03-22 ENCOUNTER — Telehealth: Payer: Self-pay

## 2024-03-22 MED ORDER — LOSARTAN POTASSIUM 25 MG PO TABS: 25.0000 mg | ORAL_TABLET | Freq: Every day | ORAL | 6 refills | Status: DC

## 2024-03-22 NOTE — Telephone Encounter (Signed)
 Per Dr. Karry note. Stop Amlodipine  and start Losartan  25mg  1 daily.

## 2024-04-02 ENCOUNTER — Ambulatory Visit (HOSPITAL_BASED_OUTPATIENT_CLINIC_OR_DEPARTMENT_OTHER)
Admission: RE | Admit: 2024-04-02 | Discharge: 2024-04-02 | Disposition: A | Discharge status: Home / Self Care | Source: Ambulatory Visit | Attending: Family Medicine | Admitting: Family Medicine

## 2024-04-02 DIAGNOSIS — M8589 Other specified disorders of bone density and structure, multiple sites: Secondary | ICD-10-CM | POA: Diagnosis not present

## 2024-04-02 DIAGNOSIS — E2839 Other primary ovarian failure: Secondary | ICD-10-CM | POA: Diagnosis present

## 2024-04-02 DIAGNOSIS — Z780 Asymptomatic menopausal state: Secondary | ICD-10-CM | POA: Diagnosis not present

## 2024-04-03 ENCOUNTER — Ambulatory Visit: Payer: Self-pay | Admitting: Family

## 2024-04-05 NOTE — Progress Notes (Signed)
 Patient reviewed via MyChart.

## 2024-04-17 ENCOUNTER — Telehealth (HOSPITAL_COMMUNITY): Payer: Self-pay

## 2024-04-17 NOTE — Telephone Encounter (Signed)
 Detailed instructions left on the patient's answering machine. Renee [name] CCT

## 2024-04-19 ENCOUNTER — Encounter: Payer: Self-pay | Admitting: Physician Assistant

## 2024-04-19 ENCOUNTER — Other Ambulatory Visit: Payer: Self-pay | Admitting: Cardiology

## 2024-04-19 ENCOUNTER — Ambulatory Visit: Admitting: Physician Assistant

## 2024-04-19 VITALS — BP 140/80 | Ht 66.0 in | Wt 220.6 lb

## 2024-04-19 DIAGNOSIS — Z860101 Personal history of adenomatous and serrated colon polyps: Secondary | ICD-10-CM

## 2024-04-19 DIAGNOSIS — R931 Abnormal findings on diagnostic imaging of heart and coronary circulation: Secondary | ICD-10-CM

## 2024-04-19 DIAGNOSIS — R0789 Other chest pain: Secondary | ICD-10-CM

## 2024-04-19 NOTE — Patient Instructions (Signed)
 _______________________________________________________  If your blood pressure at your visit was 140/90 or greater, please contact your primary care physician to follow up on this.  _______________________________________________________  If you are age 79 or older, your body mass index should be between 23-30. Your Body mass index is 35.61 kg/m. If this is out of the aforementioned range listed, please consider follow up with your Primary Care Provider.  If you are age 74 or younger, your body mass index should be between 19-25. Your Body mass index is 35.61 kg/m. If this is out of the aformentioned range listed, please consider follow up with your Primary Care Provider.   ________________________________________________________  The [facility] providers would like to encourage you to use MYCHART to communicate with providers for non-urgent requests or questions.  Due to long hold times on the telephone, sending your provider a message by [facility] may be a faster and more efficient way to get a response.  Please allow 48 business hours for a response.  Please remember that this is for non-urgent requests.  _______________________________________________________  [facility] is using a team-based approach to care.  Your team is made up of your doctor and two to three APPS. Our APPS (Nurse Practitioners and Physician Assistants) work with your physician to ensure care continuity for you. They are fully qualified to address your health concerns and develop a treatment plan. They communicate directly with your gastroenterologist to care for you. Seeing the Advanced Practice Practitioners on your physician's team can help you by facilitating care more promptly, often allowing for earlier appointments, access to diagnostic testing, procedures, and other specialty referrals.   Thank you for choosing me and [facility].  Delon Failing, PA-C

## 2024-04-19 NOTE — Progress Notes (Signed)
 Chief Complaint: Discuss colonoscopy  HPI:    Renee [name] is a 78 year old female, known to Dr. Avram, with a past medical history as listed below including essential tremor, PVCs (12/22/2023 echo with LVEF 60-65% and grade 1 diastolic dysfunction) and multiple others, who was referred to me by Domenica Harlene LABOR, MD for discussion of a colonoscopy.    01/17/2018 colonoscopy done for heme positive stool history of sigmoid resection ( inflammatory polyp and diverticular stricture) with 4 diminutive polyps in the sigmoid, descending, transverse and ascending colon.  One 1-2 mm polyp in the cecum and patent end-to-end colocolonic anastomosis.  Pathology showed adenomas.  Repeat recommended in 5 years.    01/26/2024 vitamin D  low, TSH, hemoglobin A1c normal.  CBC with a hemoglobin of 15.4 (appears around baseline).    03/01/2024 CMP with a glucose of 109 otherwise normal.    03/01/2024 patient followed with cardiology for calcium  score 438.  At that time recommended a stress test.    04/02/2024 bone density testing showed osteopenia.    04/24/2024 patient scheduled for myocardial perfusion test.    Today, the patient presents to clinic because she tells me she knows she is due for another colonoscopy, but wanted to talk about this a little bit.  She is currently being worked up for possibility of angina.  She has a stress test scheduled on 04/24/2024.  Patient tells me this all started when she was having a symptom of epigastric pressure.  She tells me though that when she burps it feels better and this only occurs every couple of days and she thinks it may occur around eating.  Currently she is using Pepcid  20 mg as needed.  In fact she uses it for this discomfort if the burping does not help and it helps it to.  She uses this maybe once a week.  Denies any other symptoms of heartburn or reflux.  No symptoms wake her up.    Denies fever, chills, weight loss or change in bowel habits.  Past Medical History:   Diagnosis Date   Abdominal pain 07/05/2017   Abnormal involuntary movement 01/01/2009   Qualifier: Diagnosis of   By: Tish MD, Elsie SCULL SNOMED Dx Update Oct 2024     Allergic rhinitis 09/19/2008   Qualifier: Diagnosis of  By: Georgian ROSALEA CHARM Lamar    Allergy    Arthralgia of right wrist 05/06/2020   Atypical chest pain 09/19/2008   Qualifier: Diagnosis of  By: Georgian ROSALEA CHARM Lamar    Cervical cancer screening 05/08/2013   Menarche 11, regular Menopause 50s G2P2 s/p 2 svd No abnormal paps or MGM Fatty cyst on labia removed.    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 Fixed sigmoid colon prevents. Referred for surgery.   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 02/09/2017   Tinnitus 12/12/2017   Tremor, essential    Unsteady gait 05/11/2023   Vertigo    Vertigo    Vitamin D  deficiency 07/29/2022    Past Surgical History:  Procedure Laterality Date   INNER EAR SURGERY  2015   with tube   MOUTH SURGERY  01/22/11   lower denture secured by titanium bolts   rectal suction biospy     XI ROBOTIC ASSISTED LOWER ANTERIOR RESECTION N/A 02/09/2017   Procedure: XI ROBOTIC RESECTION OF SIGMOID COLON, RIGID PROCTOSCOPY;  Surgeon: Sheldon Standing, MD;  Location: [facility];  Service: General;  Laterality: N/A;    Current Outpatient Medications  Medication Sig Dispense Refill   b complex vitamins tablet Take 1 tablet by mouth once a week.     clobetasol cream (TEMOVATE) 0.05 % Apply 1 Application topically 2 (two) times daily as needed (Rash). (Patient not taking: Reported on 03/14/2024)     Coenzyme Q10 200 MG capsule Take 200 mg by mouth once a week.      famotidine  (PEPCID ) 20 MG tablet Take 1 tablet (20 mg total) by mouth at bedtime as needed for heartburn or indigestion. 90 tablet 1   hydrocortisone  2.5 % cream Apply 1 Application topically 2 (two) times daily. (Patient not taking: Reported on 03/14/2024)     losartan  (COZAAR ) 25 MG tablet Take 1 tablet (25 mg total) by mouth daily. 30 tablet 6   metoprolol  succinate (TOPROL -XL) 25 MG 24 hr tablet Take 1 tablet (25 mg total) by mouth daily. Take an extra metoprolol  daily as needed. (Patient taking differently: Take 12.5 mg by mouth daily. Take an extra metoprolol  daily as needed.) 90 tablet 3   Multiple Vitamin (MULTIVITAMIN WITH MINERALS) TABS tablet Take 1 tablet by mouth every other day.     rosuvastatin  (CRESTOR ) 5 MG tablet Take 1 tablet (5 mg total) by mouth daily. (Patient not taking: Reported on 03/14/2024) 90 tablet 3   triamcinolone cream (KENALOG) 0.1 % Apply 1 Application  topically 2 (two) times daily. (Patient not taking: Reported on 03/14/2024)     Vitamin D , Ergocalciferol , (DRISDOL ) 1.25 MG (50000 UNIT) CAPS capsule Take 1 capsule (50,000 Units total) by mouth every 7 (seven) days. For 12 weeks 12 capsule 0   No current facility-administered medications for this visit.    Allergies as of 04/19/2024 - Review Complete 03/14/2024  Allergen Reaction Noted   Amoxicillin     Proair  hfa [albuterol ] Other (See Comments) 02/11/2016   Penicillins Rash     Family History  Problem Relation Age of Onset   Stroke Mother    Hypertension Mother    Alcohol  abuse Mother    Tremor Mother    Heart attack Father 53       deceased   Diabetes Daughter    Obesity Daughter    Heart disease Paternal Grandmother        AAA rupture   Heart disease Paternal Grandfather        MI   Colon cancer Neg Hx    Breast cancer Neg Hx    Prostate cancer Neg Hx    Liver cancer Neg Hx    Rectal cancer Neg Hx    Stomach cancer Neg Hx     Social History   Socioeconomic History   Marital status: Married    Spouse name: Lamar   Number of children: 2   Years of education: Not on file   Highest education level: Some college, no degree  Occupational History   Occupation: retired    Comment: Photographer  Tobacco Use   Smoking status: Former    Current packs/day: 0.00    Average packs/day: 0.5 packs/day for 53.0 years (26.5 ttl pk-yrs)    Types: Cigarettes    Start date: 1964    Quit date: 09/07/2015    Years since quitting: 8.6   Smokeless tobacco: Never  Vaping Use   Vaping status: Never Used  Substance and Sexual Activity   Alcohol use: Yes    Alcohol/week: 14.0 standard drinks of alcohol    Types: 14 Glasses of wine per week    Comment: 2 glasses wine/day   Drug use: No   Sexual activity: Yes  Other Topics Concern   Not on file  Social History Narrative   Married, retired   2 children   Former smoker no drug use, does drink alcohol   Right handed   Social  Drivers of Corporate investment banker Strain: Low Risk  (03/14/2024)   Overall Financial Resource Strain (CARDIA)    Difficulty of Paying Living Expenses: Not hard at all  Food Insecurity: No Food Insecurity (03/14/2024)   Hunger Vital Sign    Worried About Running Out of Food in the Last Year: Never true    Ran Out of Food in the Last Year: Never true  Transportation Needs: No Transportation Needs (03/14/2024)   PRAPARE - Administrator, Civil Service (Medical): No    Lack of Transportation (Non-Medical): No  Physical Activity: Sufficiently Active (03/14/2024)   Exercise Vital Sign    Days of Exercise per Week: 3 days    Minutes of Exercise per Session: 50 min  Stress: No Stress Concern Present (03/14/2024)   [employer] of Occupational Health - Occupational Stress Questionnaire    Feeling of Stress: Not at all  Social Connections: Socially Integrated (03/14/2024)   Social Connection and Isolation Panel    Frequency of Communication with Friends and Family: More than three times a week    Frequency of Social Gatherings with Friends and Family: More than three times a week    Attends Religious Services: 1 to 4 times per year    Active Member of [employer] or Organizations: Yes    Attends Banker Meetings: More than 4 times per year    Marital Status: Married  Catering manager Violence: Not At Risk (03/14/2024)   Humiliation, Afraid, Rape, and Kick questionnaire    Fear of Current or Ex-Partner: No    Emotionally Abused: No    Physically Abused: No    Sexually Abused: No    Review of  Systems:    Constitutional: No weight loss, fever or chills Skin: No rash Cardiovascular: No chest pain Respiratory: No SOB Gastrointestinal: See HPI and otherwise negative Genitourinary: No dysuria  Neurological: No headache, dizziness or syncope Musculoskeletal: No new muscle or joint pain Hematologic: No bleeding  Psychiatric: No history of depression or anxiety    Physical Exam:  Vital signs: BP (!) 140/80   Ht 5' 6 (1.676 m)   Wt 220 lb 9.6 oz (100.1 kg)   BMI 35.61 kg/m    Constitutional:   Pleasant elderly Caucasian female appears to be in NAD, Well developed, Well nourished, alert and cooperative Head:  Normocephalic and atraumatic. Eyes:   PEERL, EOMI. No icterus. Conjunctiva pink. Ears:  Normal auditory acuity. Neck:  Supple Throat: Oral cavity and pharynx without inflammation, swelling or lesion.  Respiratory: Respirations even and unlabored. Lungs clear to auscultation bilaterally.   No wheezes, crackles, or rhonchi.  Cardiovascular: Normal S1, S2. No MRG. Regular rate and rhythm. No peripheral edema, cyanosis or pallor.  Gastrointestinal:  Soft, nondistended, nontender. No rebound or guarding. Normal bowel sounds. No appreciable masses or hepatomegaly. Rectal:  Not performed.  Msk:  Symmetrical without gross deformities. Without edema, no deformity or joint abnormality.  Neurologic:  Alert and  oriented x4;  grossly normal neurologically.  Skin:   Dry and intact without significant lesions or rashes. Psychiatric: Demonstrates good judgement and reason without abnormal affect or behaviors.  RELEVANT LABS AND IMAGING: CBC    Component Value Date/Time   WBC 6.8 01/26/2024 1208   RBC 4.65 01/26/2024 1208   HGB 15.4 (H) 01/26/2024 1208   HCT 46.7 (H) 01/26/2024 1208   PLT 242.0 01/26/2024 1208   MCV 100.4 (H) 01/26/2024 1208   MCH 33.5 (H) 04/24/2020 0912   MCHC 32.9 01/26/2024 1208   RDW 14.6 01/26/2024 1208   LYMPHSABS 1.6 01/26/2024 1208   MONOABS 0.7 01/26/2024 1208   EOSABS 0.1 01/26/2024 1208   BASOSABS 0.1 01/26/2024 1208    CMP     Component Value Date/Time   NA 137 03/01/2024 1031   K 4.8 03/01/2024 1031   CL 102 03/01/2024 1031   CO2 27 03/01/2024 1031   GLUCOSE 109 (H) 03/01/2024 1031   BUN 10 03/01/2024 1031   CREATININE 0.75 03/01/2024 1031   CREATININE 0.84 04/24/2020 0912   CALCIUM  9.0 03/01/2024 1031    PROT 7.0 03/01/2024 1031   ALBUMIN 4.2 03/01/2024 1031   AST 20 03/01/2024 1031   ALT 20 03/01/2024 1031   ALKPHOS 79 03/01/2024 1031   BILITOT 0.9 03/01/2024 1031   GFRNONAA >60 02/10/2017 0519   GFRAA >60 02/10/2017 0519    Assessment: 1.  History of adenomatous polyps: Last colonoscopy in 2019 with a 5 polyps that were adenomatous, repeat recommended in 5 years, patient is slightly overdue 2.  Angina/atypical chest pain: Patient is recent been followed by cardiology for a sensation of chest pressure, which she tells me is relieved with burping or when she uses the Pepcid , she has undergone cardiac workup so far that was normal, she is scheduled for a stress test on 04/24/2024; consider GI etiology more likely given description of symptoms  Plan: 1.  Discussed with patient that pending stress test we can go ahead and get her scheduled for one further colonoscopy.  This would be for surveillance of adenomatous polyps.  This would be in the [facility] with Dr. Avram.  We will wait though until after her stress test. 2.  If stress test is negative/normal then could also do a trial of regular Pepcid  daily to see if this helps alleviate any symptoms.  Would stick to H2 blocker given history of osteopenia. 3.  Could also consider EGD +/- a surveillance colonoscopy if stress test is normal. 4.  Will send for cardiac clearance for procedure and also keep an eye out for her stress test.  Renee Failing, PA-C [facility] 04/19/2024, 2:35 PM  Cc: Domenica Harlene LABOR, MD

## 2024-04-23 ENCOUNTER — Other Ambulatory Visit: Payer: Self-pay | Admitting: Family Medicine

## 2024-04-24 ENCOUNTER — Ambulatory Visit (HOSPITAL_COMMUNITY)
Admission: RE | Admit: 2024-04-24 | Discharge: 2024-04-24 | Disposition: A | Discharge status: Home / Self Care | Source: Ambulatory Visit | Attending: Cardiology | Admitting: Cardiology

## 2024-04-24 ENCOUNTER — Encounter: Payer: Self-pay | Admitting: Cardiology

## 2024-04-24 DIAGNOSIS — I700 Atherosclerosis of aorta: Secondary | ICD-10-CM | POA: Insufficient documentation

## 2024-04-24 DIAGNOSIS — I2510 Atherosclerotic heart disease of native coronary artery without angina pectoris: Secondary | ICD-10-CM | POA: Insufficient documentation

## 2024-04-24 DIAGNOSIS — R931 Abnormal findings on diagnostic imaging of heart and coronary circulation: Secondary | ICD-10-CM | POA: Insufficient documentation

## 2024-04-24 DIAGNOSIS — I10 Essential (primary) hypertension: Secondary | ICD-10-CM | POA: Diagnosis not present

## 2024-04-24 LAB — MYOCARDIAL PERFUSION IMAGING
LV dias vol: 73 mL (ref 46–106)
LV sys vol: 21 mL (ref 3.8–5.2)
Nuc Stress EF: 71 %
Peak HR: 130 {beats}/min
Rest HR: 81 {beats}/min
Rest Nuclear Isotope Dose: 10.5 mCi
SDS: 2
SRS: 2
SSS: 4
ST Depression (mm): 0.5 mm
Stress Nuclear Isotope Dose: 31.3 mCi
TID: 0.89

## 2024-04-24 MED ORDER — TECHNETIUM TC 99M TETROFOSMIN IV KIT
10.5000 | PACK | Freq: Once | INTRAVENOUS | Status: AC | PRN
  Administered 2024-04-24: 10.5 via INTRAVENOUS

## 2024-04-24 MED ORDER — REGADENOSON 0.4 MG/5ML IV SOLN
INTRAVENOUS | Status: AC
  Filled 2024-04-24: qty 5

## 2024-04-24 MED ORDER — TECHNETIUM TC 99M TETROFOSMIN IV KIT
31.3000 | PACK | Freq: Once | INTRAVENOUS | Status: AC | PRN
  Administered 2024-04-24: 31.3 via INTRAVENOUS

## 2024-04-24 MED ORDER — REGADENOSON 0.4 MG/5ML IV SOLN
0.4000 mg | Freq: Once | INTRAVENOUS | Status: AC
  Administered 2024-04-24: 0.4 mg via INTRAVENOUS

## 2024-04-26 ENCOUNTER — Telehealth: Payer: Self-pay

## 2024-04-26 ENCOUNTER — Ambulatory Visit: Payer: Self-pay | Admitting: Cardiology

## 2024-04-26 NOTE — Telephone Encounter (Signed)
 Called the patient. Left a message of my call.

## 2024-04-26 NOTE — Telephone Encounter (Signed)
-----   Message from Delon Hendricks Failing sent at 04/26/2024 11:10 AM EDT ----- Regarding: EGD and colon Tell patient that I reviewed her recent stress test which looks normal.  Would like her to get set up for an EGD and colonoscopy in the [facility] with Dr. Avram.  I would like cardiac clearance sent to the cardiology team just for confirmation.  Thanks, JL L ----- Message ----- From: Failing Delon Hendricks, PA Sent: 04/26/2024   7:00 AM EDT To: Delon Hendricks Failing, PA Subject: check stress test                              Check on stress test- colon?

## 2024-04-26 NOTE — Telephone Encounter (Signed)
 Request for cardiac clearance routed to [facility].

## 2024-04-26 NOTE — Telephone Encounter (Signed)
 [facility] Pre-operative Risk Assessment     Request for surgical clearance:     Endoscopy Procedure  What type of surgery is being performed?     EGD and colonoscopy  When is this surgery scheduled?     To be decided once clearance is received  What type of clearance is required ?   cardiac  Are there any medications that need to be held prior to surgery and how long? no  Practice name and name of physician performing surgery?      [facility] with Dr Lupita Commander  What is your office phone and fax number?      Phone- [phone]  Fax- [phone]  Anesthesia type (None, local, MAC, general) ?       MAC   Please route your response to Almarie Goo, LPN

## 2024-04-26 NOTE — Telephone Encounter (Signed)
 Dr. Bernie,   You saw this patient on 03/01/2024. Per office protocol, will you please comment on medical clearance for colonoscopy and EGD?  Please route your response to [facility] Preop. I will communicate with requesting office once you have given recommendations.   Thank you!  Barnie Hila, NP

## 2024-04-27 ENCOUNTER — Encounter: Payer: Self-pay | Admitting: Internal Medicine

## 2024-04-27 NOTE — Telephone Encounter (Signed)
   Name: Renee [name]  DOB: 11-03-1945  MRN: [id number]   Primary Cardiologist: None  Chart reviewed as part of pre-operative protocol coverage. Renee [name] was last seen on 03/01/2024 by Dr. Krasowski.  Per Dr. Krasowski Should be no problem with proceed with GI procedure.  Therefore, based on ACC/AHA guidelines, the patient would be an acceptable risk for the planned procedure without further cardiovascular testing.   I will route this recommendation to the requesting party via Epic fax function and remove from pre-op pool. Please call with questions.  Barnie Hila, NP 04/27/2024, 7:08 AM

## 2024-04-27 NOTE — Telephone Encounter (Signed)
 Cardiac clearance given.  Patient is ready to be scheduled for EGD and colonoscopy with Dr Avram. She will need a pre-visit since she has not been instructed, unless she is comfortable receiving her instructions through the mail. She was seen by JINNY Failing, PA 04/19/24.

## 2024-05-04 ENCOUNTER — Ambulatory Visit: Payer: Self-pay | Admitting: Cardiology

## 2024-05-10 ENCOUNTER — Telehealth: Payer: Self-pay

## 2024-05-10 NOTE — Telephone Encounter (Signed)
 Pt viewed Stress test results on My Chart per Dr. Madireddy's note. Routed to PCP.

## 2024-06-01 ENCOUNTER — Ambulatory Visit (AMBULATORY_SURGERY_CENTER)

## 2024-06-01 VITALS — Ht 66.0 in | Wt 220.0 lb

## 2024-06-01 DIAGNOSIS — K219 Gastro-esophageal reflux disease without esophagitis: Secondary | ICD-10-CM

## 2024-06-01 DIAGNOSIS — Z860100 Personal history of colon polyps, unspecified: Secondary | ICD-10-CM

## 2024-06-01 MED ORDER — NA SULFATE-K SULFATE-MG SULF 17.5-3.13-1.6 GM/177ML PO SOLN: 1.0000 | Freq: Once | ORAL | 0 refills | Status: AC

## 2024-06-01 NOTE — Progress Notes (Addendum)
 Pre visit completed via video call; Patient verified name, DOB, and address; No egg or soy allergy known to patient;  No issues known to pt with past sedation with any surgeries or procedures; Patient denies ever being told they had issues or difficulty with intubation;  No FH of Malignant Hyperthermia; Pt is not on diet pills; Pt is not on home 02;  Pt is not on blood thinners; Pt denies issues with constipation;  No A fib or A flutter; Have any cardiac testing pending--NO Insurance verified during PV appt--- [facility] Medicare Pt can ambulate without assistance;  Pt denies use of chewing tobacco; Discussed diabetic/weight loss medication holds; Discussed NSAID holds; Checked BMI to be less than 50; Pt instructed to use Singlecare.com or GoodRx for a price reduction on prep;   Pre visit completed and red dot placed by patient's name on their procedure day (on provider's schedule);   Instructions sent to MyChart per patient request;

## 2024-06-01 NOTE — Addendum Note (Signed)
 Addended by: LORRENE ASHLEY BRAVO on: 06/01/2024 08:55 AM   Modules accepted: Orders

## 2024-06-07 ENCOUNTER — Encounter

## 2024-06-13 ENCOUNTER — Encounter: Payer: Self-pay | Admitting: Internal Medicine

## 2024-06-18 ENCOUNTER — Telehealth: Payer: Self-pay | Admitting: Internal Medicine

## 2024-06-18 NOTE — Telephone Encounter (Signed)
 Inbound call from patient stating she would like to know how much her insurance will cover for procedure on 10/30. Requesting a call back Please advise  Thank you

## 2024-06-20 NOTE — Progress Notes (Unsigned)
 [facility] History and Physical   Primary Care Physician:  Domenica Harlene LABOR, MD   Reason for Procedure:    Encounter Diagnoses  Name Primary?   History of adenomatous polyp of colon Yes   Atypical chest pain      Plan:    EGD and colonoscopy     HPI: Renee [name] is a 78 y.o. female with a hx of colon adenoma and also had atypical chest pain and epigastric pressure. Cardiology has cleared her, has had a low risk cardiac perfusion scan. She was seen in late August in our clinic.  3 diminutive adenomas removed 2019, s/p segmental resection for diverticular stricture, also. Past Medical History:  Diagnosis Date   Abdominal pain 07/05/2017   Abnormal involuntary movement 01/01/2009   Qualifier: Diagnosis of   By: Tish MD, Elsie SCULL SNOMED Dx Update Oct 2024     Allergic rhinitis 09/19/2008   Qualifier: Diagnosis of  By: Georgian ROSALEA CHARM Lamar    Allergy    Arthralgia of right wrist 05/06/2020   Atypical chest pain 09/19/2008   Qualifier: Diagnosis of  By: Georgian ROSALEA CHARM Lamar    Cervical cancer screening 05/08/2013   Menarche 11, regular Menopause 50s G2P2 s/p 2 svd No abnormal paps or MGM Fatty cyst on labia removed.    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 Fixed sigmoid colon prevents. Referred for surgery.   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 02/09/2017   Tinnitus 12/12/2017   Tremor, essential    Unsteady gait 05/11/2023   Vertigo    Vitamin D  deficiency 07/29/2022    Past Surgical History:  Procedure Laterality Date   INNER EAR SURGERY  2015   with tube   MOUTH SURGERY  01/22/11   lower denture secured by titanium bolts   rectal suction biospy     XI ROBOTIC ASSISTED LOWER ANTERIOR RESECTION N/A 02/09/2017   Procedure: XI ROBOTIC RESECTION OF SIGMOID COLON, RIGID PROCTOSCOPY;  Surgeon: Sheldon Standing, MD;  Location: [facility];  Service: General;  Laterality: N/A;     Current Outpatient Medications  Medication Sig Dispense Refill   b complex vitamins tablet Take 1 tablet by mouth once a week.     clobetasol cream (TEMOVATE) 0.05 % Apply 1 Application topically 2 (two) times daily as needed (Rash).     Coenzyme Q10 200 MG capsule Take 200 mg by mouth once a week.      famotidine  (PEPCID ) 20 MG tablet Take 1 tablet (20 mg total) by mouth at bedtime as needed for heartburn or indigestion. 90 tablet 1   hydrocortisone  2.5 % cream Apply 1 Application topically 2 (two) times daily. (Patient taking differently: Apply 1 Application topically 2 (two) times daily as needed.)     metoprolol   succinate (TOPROL -XL) 25 MG 24 hr tablet Take 1 tablet (25 mg total) by mouth daily. Take an extra metoprolol  daily as needed. (Patient taking differently: Take 12.5 mg by mouth daily. Take an extra metoprolol  daily as needed.) 90 tablet 3   Multiple Vitamin (MULTIVITAMIN WITH MINERALS) TABS tablet Take 1 tablet by mouth every other day.     triamcinolone cream (KENALOG) 0.1 % Apply 1 Application topically 2 (two) times daily. (Patient taking differently: Apply 1 Application topically 2 (two) times daily as needed.)     Vitamin D , Ergocalciferol , (DRISDOL ) 1.25 MG (50000 UNIT) CAPS capsule TAKE 1 CAPSULE (50,000 UNITS TOTAL) BY MOUTH EVERY 7 (SEVEN) DAYS. FOR 12 WEEKS 12 capsule 0   No current facility-administered medications for this visit.    Allergies as of 06/21/2024 - Review Complete 06/01/2024  Allergen Reaction Noted   Amoxicillin     Proair  hfa [albuterol ] Other (See Comments) 02/11/2016   Penicillins Rash     Family History  Problem Relation Age of Onset   Stroke Mother    Hypertension Mother    Alcohol abuse Mother    Tremor Mother    Heart attack Father 70       deceased   Heart disease Paternal Grandmother        AAA rupture   Heart disease Paternal Grandfather        MI   Diabetes Daughter    Obesity Daughter    Colon cancer Neg Hx    Breast cancer Neg Hx    Prostate cancer Neg Hx    Liver cancer Neg Hx    Rectal cancer Neg Hx    Stomach cancer Neg Hx    Colon polyps Neg Hx    Esophageal cancer Neg Hx     Social History   Socioeconomic History   Marital status: Married    Spouse name: Lamar   Number of children: 2   Years of education: Not on file   Highest education level: Some college, no degree  Occupational History   Occupation: retired    Comment: photographer  Tobacco Use   Smoking status: Former    Current packs/day: 0.00    Average packs/day: 0.5 packs/day for 53.0 years (26.5 ttl pk-yrs)    Types: Cigarettes    Start date: 1964    Quit date:  09/07/2015    Years since quitting: 8.7   Smokeless tobacco: Never  Vaping Use   Vaping status: Never Used  Substance and Sexual Activity   Alcohol use: Yes    Alcohol/week: 10.0 standard drinks of alcohol    Types: 10 Glasses of wine per week    Comment: 2 glasses wine/day  Drug use: No   Sexual activity: Yes  Other Topics Concern   Not on file  Social History Narrative   Married, retired   2 children   Former smoker no drug use, does drink alcohol   Right handed   Social Drivers of Corporate Investment Banker Strain: Low Risk  (03/14/2024)   Overall Financial Resource Strain (CARDIA)    Difficulty of Paying Living Expenses: Not hard at all  Food Insecurity: No Food Insecurity (03/14/2024)   Hunger Vital Sign    Worried About Running Out of Food in the Last Year: Never true    Ran Out of Food in the Last Year: Never true  Transportation Needs: No Transportation Needs (03/14/2024)   PRAPARE - Administrator, Civil Service (Medical): No    Lack of Transportation (Non-Medical): No  Physical Activity: Sufficiently Active (03/14/2024)   Exercise Vital Sign    Days of Exercise per Week: 3 days    Minutes of Exercise per Session: 50 min  Stress: No Stress Concern Present (03/14/2024)   [employer] of Occupational Health - Occupational Stress Questionnaire    Feeling of Stress: Not at all  Social Connections: Socially Integrated (03/14/2024)   Social Connection and Isolation Panel    Frequency of Communication with Friends and Family: More than three times a week    Frequency of Social Gatherings with Friends and Family: More than three times a week    Attends Religious Services: 1 to 4 times per year    Active Member of [employer] or Organizations: Yes    Attends Engineer, Structural: More than 4 times per year    Marital Status: Married  Catering Manager Violence: Not At Risk (03/14/2024)   Humiliation, Afraid, Rape, and Kick questionnaire    Fear of Current  or Ex-Partner: No    Emotionally Abused: No    Physically Abused: No    Sexually Abused: No    Review of Systems: Positive for *** All other review of systems negative except as mentioned in the HPI.  Physical Exam: Vital signs There were no vitals taken for this visit.  General:   Alert,  Well-developed, well-nourished, pleasant and cooperative in NAD Lungs:  Clear throughout to auscultation.   Heart:  Regular rate and rhythm; no murmurs, clicks, rubs,  or gallops. Abdomen:  Soft, nontender and nondistended. Normal bowel sounds.   Neuro/Psych:  Alert and cooperative. Normal mood and affect. A and O x 3   @Alexandria Shiflett  CHARLENA Commander, MD, [facility] [phone] (pager) 06/20/2024 8:09 PM@

## 2024-06-21 ENCOUNTER — Ambulatory Visit (AMBULATORY_SURGERY_CENTER): Admitting: Internal Medicine

## 2024-06-21 ENCOUNTER — Encounter: Payer: Self-pay | Admitting: Internal Medicine

## 2024-06-21 VITALS — BP 117/86 | HR 79 | Temp 97.5°F | Resp 15 | Ht 66.0 in | Wt 220.0 lb

## 2024-06-21 DIAGNOSIS — D125 Benign neoplasm of sigmoid colon: Secondary | ICD-10-CM | POA: Diagnosis not present

## 2024-06-21 DIAGNOSIS — D123 Benign neoplasm of transverse colon: Secondary | ICD-10-CM

## 2024-06-21 DIAGNOSIS — Z1211 Encounter for screening for malignant neoplasm of colon: Secondary | ICD-10-CM

## 2024-06-21 DIAGNOSIS — R0789 Other chest pain: Secondary | ICD-10-CM

## 2024-06-21 DIAGNOSIS — K219 Gastro-esophageal reflux disease without esophagitis: Secondary | ICD-10-CM

## 2024-06-21 DIAGNOSIS — Z860101 Personal history of adenomatous and serrated colon polyps: Secondary | ICD-10-CM

## 2024-06-21 DIAGNOSIS — Z980 Intestinal bypass and anastomosis status: Secondary | ICD-10-CM

## 2024-06-21 MED ORDER — SODIUM CHLORIDE 0.9 % IV SOLN: 500.0000 mL | Freq: Once | INTRAVENOUS | Status: DC

## 2024-06-21 NOTE — Progress Notes (Signed)
 Report given to PACU, vss

## 2024-06-21 NOTE — Patient Instructions (Addendum)
 I found and removed 2 tiny polyps that look benign.  I will have them analyzed.  You do not need to return for a routine repeat colonoscopy.  I appreciate the opportunity to care for you. Renee [name] Commander, MD, [facility]   Handouts given: Polyps Resume previous diet. Continue present medications.  Await pathology results. No repeat colonoscopy due to age.  YOU HAD AN ENDOSCOPIC PROCEDURE TODAY AT THE [facility]:   Refer to the procedure report that was given to you for any specific questions about what was found during the examination.  If the procedure report does not answer your questions, please call your gastroenterologist to clarify.  If you requested that your care partner not be given the details of your procedure findings, then the procedure report has been included in a sealed envelope for you to review at your convenience later.  YOU SHOULD EXPECT: Some feelings of bloating in the abdomen. Passage of more gas than usual.  Walking can help get rid of the air that was put into your GI tract during the procedure and reduce the bloating. If you had a lower endoscopy (such as a colonoscopy or flexible sigmoidoscopy) you may notice spotting of blood in your stool or on the toilet paper. If you underwent a bowel prep for your procedure, you may not have a normal bowel movement for a few days.  Please Note:  You might notice some irritation and congestion in your nose or some drainage.  This is from the oxygen used during your procedure.  There is no need for concern and it should clear up in a day or so.  SYMPTOMS TO REPORT IMMEDIATELY:  Following lower endoscopy (colonoscopy or flexible sigmoidoscopy):  Excessive amounts of blood in the stool  Significant tenderness or worsening of abdominal pains  Swelling of the abdomen that is new, acute  Fever of 100F or higher   For urgent or emergent issues, a gastroenterologist can be reached at any hour by calling [phone]. Do  not use MyChart messaging for urgent concerns.    DIET:  We do recommend a small meal at first, but then you may proceed to your regular diet.  Drink plenty of fluids but you should avoid alcoholic beverages for 24 hours.  ACTIVITY:  You should plan to take it easy for the rest of today and you should NOT DRIVE or use heavy machinery until tomorrow (because of the sedation medicines used during the test).    FOLLOW UP: Our staff will call the number listed on your records the next business day following your procedure.  We will call around 7:15- 8:00 am to check on you and address any questions or concerns that you may have regarding the information given to you following your procedure. If we do not reach you, we will leave a message.     If any biopsies were taken you will be contacted by phone or by letter within the next 1-3 weeks.  Please call us  at [phone] if you have not heard about the biopsies in 3 weeks.    SIGNATURES/CONFIDENTIALITY: You and/or your care partner have signed paperwork which will be entered into your electronic medical record.  These signatures attest to the fact that that the information above on your After Visit Summary has been reviewed and is understood.  Full responsibility of the confidentiality of this discharge information lies with you and/or your care-partner.

## 2024-06-21 NOTE — Progress Notes (Signed)
0923 Robinul 0.1 mg IV given due large amount of secretions upon assessment.  MD made aware, vss  

## 2024-06-21 NOTE — Op Note (Signed)
 [facility] Patient Name: Renee [name] Procedure Date: 06/21/2024 9:12 AM MRN: [id number] Endoscopist: Lupita FORBES Commander , MD, [id number] Age: 78 Referring MD:  Date of Birth: May 01, 1946 Gender: Female Account #: [id number] Procedure:                Colonoscopy Indications:              High risk colon cancer surveillance: Personal                            history of colonic polyps, Last colonoscopy: 2019 Medicines:                Monitored Anesthesia Care Procedure:                Pre-Anesthesia Assessment:                           - Prior to the procedure, a History and Physical                            was performed, and patient medications and                            allergies were reviewed. The patient's tolerance of                            previous anesthesia was also reviewed. The risks                            and benefits of the procedure and the sedation                            options and risks were discussed with the patient.                            All questions were answered, and informed consent                            was obtained. Prior Anticoagulants: The patient has                            taken no anticoagulant or antiplatelet agents. ASA                            Grade Assessment: II - A patient with mild systemic                            disease. After reviewing the risks and benefits,                            the patient was deemed in satisfactory condition to                            undergo the procedure.  After obtaining informed consent, the colonoscope                            was passed under direct vision. Throughout the                            procedure, the patient's blood pressure, pulse, and                            oxygen saturations were monitored continuously. The                            Olympus Scope SN: [id number] was introduced through                            the anus and  advanced to the the cecum, identified                            by appendiceal orifice and ileocecal valve. The                            colonoscopy was performed without difficulty. The                            patient tolerated the procedure well. The quality                            of the bowel preparation was good. The ileocecal                            valve, appendiceal orifice, and rectum were                            photographed. The bowel preparation used was SUPREP                            via split dose instruction. Scope In: 9:30:15 AM Scope Out: 9:44:14 AM Scope Withdrawal Time: 0 hours 12 minutes 21 seconds  Total Procedure Duration: 0 hours 13 minutes 59 seconds  Findings:                 The perianal and digital rectal examinations were                            normal.                           Two sessile polyps were found in the sigmoid colon                            and transverse colon. The polyps were diminutive in                            size. These polyps were removed with a cold snare.  Resection and retrieval were complete. Verification                            of patient identification for the specimen was                            done. Estimated blood loss was minimal.                           There was evidence of a prior end-to-end                            colo-colonic anastomosis in the distal sigmoid                            colon. This was patent and was characterized by                            healthy appearing mucosa. The anastomosis was                            traversed.                           The exam was otherwise without abnormality on                            direct and retroflexion views. Complications:            No immediate complications. Estimated Blood Loss:     Estimated blood loss was minimal. Impression:               - Two diminutive polyps in the sigmoid colon and in                             the transverse colon, removed with a cold snare.                            Resected and retrieved.                           - Patent end-to-end colo-colonic anastomosis,                            characterized by healthy appearing mucosa.                           - The examination was otherwise normal on direct                            and retroflexion views. Recommendation:           - Patient has a contact number available for                            emergencies. The signs and symptoms of potential  delayed complications were discussed with the                            patient. Return to normal activities tomorrow.                            Written discharge instructions were provided to the                            patient.                           - Resume previous diet.                           - Continue present medications.                           - Await pathology results.                           - No repeat colonoscopy due to age. Lupita FORBES Commander, MD 06/21/2024 9:50:20 AM This report has been signed electronically.

## 2024-06-21 NOTE — Progress Notes (Signed)
 Called to room to assist during endoscopic procedure.  Patient ID and intended procedure confirmed with present staff. Received instructions for my participation in the procedure from the performing physician.

## 2024-06-21 NOTE — Progress Notes (Signed)
 Pt's states no medical or surgical changes since previsit or office visit.

## 2024-06-25 LAB — SURGICAL PATHOLOGY

## 2024-07-04 ENCOUNTER — Ambulatory Visit: Payer: Self-pay | Admitting: Internal Medicine

## 2024-07-16 ENCOUNTER — Other Ambulatory Visit: Payer: Self-pay | Admitting: Family Medicine

## 2024-07-27 ENCOUNTER — Other Ambulatory Visit: Payer: Self-pay | Admitting: Family Medicine

## 2024-07-27 DIAGNOSIS — K2289 Other specified disease of esophagus: Secondary | ICD-10-CM

## 2024-07-27 DIAGNOSIS — K219 Gastro-esophageal reflux disease without esophagitis: Secondary | ICD-10-CM

## 2024-07-27 DIAGNOSIS — R1310 Dysphagia, unspecified: Secondary | ICD-10-CM

## 2024-08-01 ENCOUNTER — Telehealth: Payer: Self-pay

## 2024-08-01 NOTE — Telephone Encounter (Signed)
 Copied from CRM #[id number]. Topic: General - Other >> Aug 01, 2024 11:29 AM Thersia BROCKS wrote: Reason for CRM: Patient called in regarding appointments, wanted to know if she needs to keep this appointment or wait until its time for a physical to be scheduled for that, would like a nurse to give her a callback regarding this

## 2024-08-05 NOTE — Assessment & Plan Note (Signed)
 Supplement and monitor

## 2024-08-05 NOTE — Progress Notes (Unsigned)
 Subjective:    Patient ID: Renee [name], female    DOB: [birth year], 78 y.o.   MRN: [id number]  No chief complaint on file.   HPI Discussed the use of AI scribe software for clinical note transcription with the patient, who gave verbal consent to proceed.  History of Present Illness     Past Medical History:  Diagnosis Date   Abdominal pain 07/05/2017   Abnormal involuntary movement 01/01/2009   Qualifier: Diagnosis of   By: Tish MD, Elsie SCULL SNOMED Dx Update Oct 2024     Allergic rhinitis 09/19/2008   Qualifier: Diagnosis of  By: Georgian ROSALEA CHARM Lamar    Allergy    Arthralgia of right wrist 05/06/2020   Atypical chest pain 09/19/2008   Qualifier: Diagnosis of  By: Georgian ROSALEA CHARM Lamar    Cervical cancer screening 05/08/2013   Menarche 11, regular Menopause 50s G2P2 s/p 2 svd No abnormal paps or MGM Fatty cyst on labia removed.    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 Fixed sigmoid colon prevents. Referred for surgery.   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 02/09/2017   Tinnitus 12/12/2017   Tremor, essential    Unsteady gait 05/11/2023   Vertigo    Vitamin D  deficiency 07/29/2022    Past Surgical History:  Procedure Laterality Date   INNER EAR SURGERY  2015   with tube   MOUTH SURGERY  01/22/11   lower denture secured by titanium bolts   rectal suction biospy     XI ROBOTIC ASSISTED LOWER ANTERIOR RESECTION N/A 02/09/2017   Procedure: XI ROBOTIC RESECTION OF SIGMOID COLON, RIGID PROCTOSCOPY;  Surgeon: Sheldon Standing, MD;  Location: [facility];  Service: General;  Laterality: N/A;    Family History  Problem Relation Age of Onset   Stroke Mother    Hypertension Mother    Alcohol abuse Mother    Tremor Mother    Heart attack Father 47  deceased   Heart disease Paternal Grandmother        AAA rupture   Heart disease Paternal Grandfather        MI   Diabetes Daughter    Obesity Daughter    Colon cancer Neg Hx    Breast cancer Neg Hx    Prostate cancer Neg Hx    Liver cancer Neg Hx    Rectal cancer Neg Hx    Stomach cancer Neg Hx    Colon polyps Neg Hx    Esophageal cancer Neg Hx     Social History   Socioeconomic History   Marital status: Married    Spouse name: Lamar   Number of children: 2   Years of education: Not on file   Highest education level: Some college, no degree  Occupational History   Occupation: retired    Comment: photographer  Tobacco Use   Smoking status: Former    Current packs/day: 0.00     Average packs/day: 0.5 packs/day for 53.0 years (26.5 ttl pk-yrs)    Types: Cigarettes    Start date: 1964    Quit date: 09/07/2015    Years since quitting: 8.9   Smokeless tobacco: Never  Vaping Use   Vaping status: Never Used  Substance and Sexual Activity   Alcohol use: Yes    Alcohol/week: 10.0 standard drinks of alcohol    Types: 10 Glasses of wine per week    Comment: 2 glasses wine/day   Drug use: No   Sexual activity: Yes  Other Topics Concern   Not on file  Social History Narrative   Married, retired   2 children   Former smoker no drug use, does drink alcohol   Right handed   Social Drivers of Health   Tobacco Use: Medium Risk (06/21/2024)   Patient History    Smoking Tobacco Use: Former    Smokeless Tobacco Use: Never    Passive Exposure: Not on Actuary Strain: Low Risk (03/14/2024)   Overall Financial Resource Strain (CARDIA)    Difficulty of Paying Living Expenses: Not hard at all  Food Insecurity: No Food Insecurity (03/14/2024)   Epic    Worried About Radiation Protection Practitioner of Food in the Last Year: Never true    Ran Out of Food in the Last Year: Never true  Transportation Needs: No Transportation Needs (03/14/2024)   Epic    Lack of Transportation (Medical): No    Lack of Transportation (Non-Medical): No  Physical Activity: Sufficiently Active (03/14/2024)   Exercise Vital Sign    Days of Exercise per Week: 3 days    Minutes of Exercise per Session: 50 min  Stress: No Stress Concern Present (03/14/2024)   [employer] of Occupational Health - Occupational Stress Questionnaire    Feeling of Stress: Not at all  Social Connections: Socially Integrated (03/14/2024)   Social Connection and Isolation Panel    Frequency of Communication with Friends and Family: More than three times a week    Frequency of Social Gatherings with Friends and Family: More than three times a week    Attends Religious Services: 1 to 4 times per year     Active Member of [employer] or Organizations: Yes    Attends Banker Meetings: More than 4 times per year    Marital Status: Married  Catering Manager Violence: Not At Risk (03/14/2024)   Epic    Fear of Current or Ex-Partner: No    Emotionally Abused: No  Physically Abused: No    Sexually Abused: No  Depression (PHQ2-9): Low Risk (03/14/2024)   Depression (PHQ2-9)    PHQ-2 Score: 1  Alcohol Screen: Low Risk (03/14/2024)   Alcohol Screen    Last Alcohol Screening Score (AUDIT): 4  Housing: Low Risk (03/14/2024)   Epic    Unable to Pay for Housing in the Last Year: No    Number of Times Moved in the Last Year: 0    Homeless in the Last Year: No  Utilities: Not At Risk (03/14/2024)   Epic    Threatened with loss of utilities: No  Health Literacy: Adequate Health Literacy (03/14/2024)   B1300 Health Literacy    Frequency of need for help with medical instructions: Never    Outpatient Medications Prior to Visit  Medication Sig Dispense Refill   b complex vitamins tablet Take 1 tablet by mouth once a week.     clobetasol cream (TEMOVATE) 0.05 % Apply 1 Application topically 2 (two) times daily as needed (Rash).     Coenzyme Q10 200 MG capsule Take 200 mg by mouth once a week.      famotidine  (PEPCID ) 20 MG tablet TAKE 1 TABLET (20 MG TOTAL) BY MOUTH AT BEDTIME AS NEEDED FOR HEARTBURN OR INDIGESTION. 90 tablet 3   hydrocortisone  2.5 % cream Apply 1 Application topically 2 (two) times daily. (Patient taking differently: Apply 1 Application topically 2 (two) times daily as needed.)     metoprolol  succinate (TOPROL -XL) 25 MG 24 hr tablet Take 1 tablet (25 mg total) by mouth daily. Take an extra metoprolol  daily as needed. (Patient taking differently: Take 12.5 mg by mouth daily. Take an extra metoprolol  daily as needed.) 90 tablet 3   Multiple Vitamin (MULTIVITAMIN WITH MINERALS) TABS tablet Take 1 tablet by mouth every other day.     triamcinolone cream  (KENALOG) 0.1 % Apply 1 Application topically 2 (two) times daily. (Patient taking differently: Apply 1 Application topically 2 (two) times daily as needed.)     Vitamin D , Ergocalciferol , (DRISDOL ) 1.25 MG (50000 UNIT) CAPS capsule TAKE 1 CAPSULE (50,000 UNITS TOTAL) BY MOUTH EVERY 7 (SEVEN) DAYS. FOR 12 WEEKS 12 capsule 0   No facility-administered medications prior to visit.    Allergies[1]  Review of Systems  Constitutional:  Negative for fever and malaise/fatigue.  HENT:  Negative for congestion.   Eyes:  Negative for blurred vision.  Respiratory:  Negative for shortness of breath.   Cardiovascular:  Negative for chest pain, palpitations and leg swelling.  Gastrointestinal:  Negative for abdominal pain, blood in stool and nausea.  Genitourinary:  Negative for dysuria and frequency.  Musculoskeletal:  Negative for falls.  Skin:  Negative for rash.  Neurological:  Positive for tremors. Negative for dizziness, loss of consciousness and headaches.  Endo/Heme/Allergies:  Negative for environmental allergies.  Psychiatric/Behavioral:  Negative for depression. The patient is not nervous/anxious.        Objective:    Physical Exam Constitutional:      General: She is not in acute distress.    Appearance: Normal appearance. She is well-developed. She is not toxic-appearing.  HENT:     Head: Normocephalic and atraumatic.     Right Ear: External ear normal.     Left Ear: External ear normal.     Nose: Nose normal.  Eyes:     General:        Right eye: No discharge.        Left eye: No discharge.  Conjunctiva/sclera: Conjunctivae normal.  Neck:     Thyroid : No thyromegaly.  Cardiovascular:     Rate and Rhythm: Normal rate and regular rhythm.     Heart sounds: Normal heart sounds. No murmur heard. Pulmonary:     Effort: Pulmonary effort is normal. No respiratory distress.     Breath sounds: Normal breath sounds.  Abdominal:     General: Bowel sounds are normal.      Palpations: Abdomen is soft.     Tenderness: There is no abdominal tenderness. There is no guarding.  Musculoskeletal:        General: Normal range of motion.     Cervical back: Neck supple.  Lymphadenopathy:     Cervical: No cervical adenopathy.  Skin:    General: Skin is warm and dry.  Neurological:     Mental Status: She is alert and oriented to person, place, and time.  Psychiatric:        Mood and Affect: Mood normal.        Behavior: Behavior normal.        Thought Content: Thought content normal.        Judgment: Judgment normal.    There were no vitals taken for this visit. Wt Readings from Last 3 Encounters:  06/21/24 220 lb (99.8 kg)  06/01/24 220 lb (99.8 kg)  04/19/24 220 lb 9.6 oz (100.1 kg)    Diabetic Foot Exam - Simple   No data filed    Lab Results  Component Value Date   WBC 6.8 01/26/2024   HGB 15.4 (H) 01/26/2024   HCT 46.7 (H) 01/26/2024   PLT 242.0 01/26/2024   GLUCOSE 109 (H) 03/01/2024   CHOL 205 (H) 01/05/2024   TRIG 132 01/05/2024   HDL 69 01/05/2024   LDLDIRECT 148.6 09/26/2008   LDLCALC 113 (H) 01/05/2024   ALT 20 03/01/2024   AST 20 03/01/2024   NA 137 03/01/2024   K 4.8 03/01/2024   CL 102 03/01/2024   CREATININE 0.75 03/01/2024   BUN 10 03/01/2024   CO2 27 03/01/2024   TSH 1.70 01/26/2024   HGBA1C 6.2 01/26/2024    Lab Results  Component Value Date   TSH 1.70 01/26/2024   Lab Results  Component Value Date   WBC 6.8 01/26/2024   HGB 15.4 (H) 01/26/2024   HCT 46.7 (H) 01/26/2024   MCV 100.4 (H) 01/26/2024   PLT 242.0 01/26/2024   Lab Results  Component Value Date   NA 137 03/01/2024   K 4.8 03/01/2024   CO2 27 03/01/2024   GLUCOSE 109 (H) 03/01/2024   BUN 10 03/01/2024   CREATININE 0.75 03/01/2024   BILITOT 0.9 03/01/2024   ALKPHOS 79 03/01/2024   AST 20 03/01/2024   ALT 20 03/01/2024   PROT 7.0 03/01/2024   ALBUMIN 4.2 03/01/2024   CALCIUM  9.0 03/01/2024   ANIONGAP 6 02/10/2017   GFR 76.44 03/01/2024    Lab Results  Component Value Date   CHOL 205 (H) 01/05/2024   Lab Results  Component Value Date   HDL 69 01/05/2024   Lab Results  Component Value Date   LDLCALC 113 (H) 01/05/2024   Lab Results  Component Value Date   TRIG 132 01/05/2024   Lab Results  Component Value Date   CHOLHDL 3.0 01/05/2024   Lab Results  Component Value Date   HGBA1C 6.2 01/26/2024       Assessment & Plan:  Vitamin D  deficiency Assessment & Plan: Supplement and monitor  Neuromuscular disorder [facility]) Assessment & Plan: Continues to struggle with baseline tremor    Essential hypertension Assessment & Plan: Well controlled, no changes to meds. Encouraged heart healthy diet such as the DASH diet and exercise as tolerated.     Hyperlipidemia, mixed Assessment & Plan: Encourage heart healthy diet such as MIND or DASH diet, increase exercise, avoid trans fats, simple carbohydrates and processed foods, consider a krill or fish or flaxseed oil cap daily.     Osteopenia, unspecified location Assessment & Plan: Encouraged to get adequate exercise, calcium  and vitamin d  intake      Assessment and Plan Assessment & Plan      Harlene Horton, MD     [1] Allergies Allergen Reactions   Amoxicillin Anaphylaxis    REACTION: TROUBLE BREATHING Has patient had a PCN reaction causing immediate rash, facial/tongue/throat swelling, SOB or lightheadedness with hypotension:Yes Has patient had a PCN reaction causing severe rash involving mucus membranes or skin necrosis:No Has patient had a PCN reaction that required hospitalization:No Has patient had a PCN reaction occurring within the last 10 years:No If all of the above answers are NO, then may proceed with Cephalosporin use.    Proair  Hfa [Albuterol ] Other (See Comments)    Hypersensitivity, anxious, disoriented   Penicillins Rash    Has patient had a PCN reaction causing immediate rash, facial/tongue/throat swelling, SOB or  lightheadedness with hypotension:unknown Has patient had a PCN reaction causing severe rash involving mucus membranes or skin necrosis:unknown Has patient had a PCN reaction that required hospitalization:no Has patient had a PCN reaction occurring within the last 10 years:no If all of the above answers are NO, then may proceed with Cephalosporin use.

## 2024-08-05 NOTE — Assessment & Plan Note (Signed)
 Well controlled, no changes to meds. Encouraged heart healthy diet such as the DASH diet and exercise as tolerated.

## 2024-08-05 NOTE — Assessment & Plan Note (Signed)
 Encouraged to get adequate exercise, calcium and vitamin d intake

## 2024-08-05 NOTE — Assessment & Plan Note (Signed)
 Continues to struggle with baseline tremor

## 2024-08-05 NOTE — Assessment & Plan Note (Signed)
 Encourage heart healthy diet such as MIND or DASH diet, increase exercise, avoid trans fats, simple carbohydrates and processed foods, consider a krill or fish or flaxseed oil cap daily.

## 2024-08-06 ENCOUNTER — Ambulatory Visit: Admitting: Family Medicine

## 2024-08-06 ENCOUNTER — Encounter: Admitting: Family Medicine

## 2024-08-06 VITALS — BP 128/76 | HR 68 | Temp 97.7°F | Resp 16 | Ht 66.0 in | Wt 219.2 lb

## 2024-08-06 DIAGNOSIS — H9191 Unspecified hearing loss, right ear: Secondary | ICD-10-CM | POA: Diagnosis not present

## 2024-08-06 DIAGNOSIS — E559 Vitamin D deficiency, unspecified: Secondary | ICD-10-CM

## 2024-08-06 DIAGNOSIS — E782 Mixed hyperlipidemia: Secondary | ICD-10-CM | POA: Diagnosis not present

## 2024-08-06 DIAGNOSIS — I10 Essential (primary) hypertension: Secondary | ICD-10-CM

## 2024-08-06 DIAGNOSIS — M8580 Other specified disorders of bone density and structure, unspecified site: Secondary | ICD-10-CM | POA: Diagnosis not present

## 2024-08-06 DIAGNOSIS — R739 Hyperglycemia, unspecified: Secondary | ICD-10-CM | POA: Diagnosis not present

## 2024-08-06 DIAGNOSIS — G709 Myoneural disorder, unspecified: Secondary | ICD-10-CM | POA: Diagnosis not present

## 2024-08-06 LAB — CBC WITH DIFFERENTIAL/PLATELET
Basophils Absolute: 0.1 K/uL (ref 0.0–0.1)
Basophils Relative: 1.1 % (ref 0.0–3.0)
Eosinophils Absolute: 0.1 K/uL (ref 0.0–0.7)
Eosinophils Relative: 1.2 % (ref 0.0–5.0)
HCT: 45.7 % (ref 36.0–46.0)
Hemoglobin: 15.4 g/dL — ABNORMAL HIGH (ref 12.0–15.0)
Lymphocytes Relative: 20.1 % (ref 12.0–46.0)
Lymphs Abs: 1.4 K/uL (ref 0.7–4.0)
MCHC: 33.8 g/dL (ref 30.0–36.0)
MCV: 100 fl (ref 78.0–100.0)
Monocytes Absolute: 0.7 K/uL (ref 0.1–1.0)
Monocytes Relative: 9.6 % (ref 3.0–12.0)
Neutro Abs: 4.9 K/uL (ref 1.4–7.7)
Neutrophils Relative %: 68 % (ref 43.0–77.0)
Platelets: 229 K/uL (ref 150.0–400.0)
RBC: 4.58 Mil/uL (ref 3.87–5.11)
RDW: 15.3 % (ref 11.5–15.5)
WBC: 7.2 K/uL (ref 4.0–10.5)

## 2024-08-06 LAB — COMPREHENSIVE METABOLIC PANEL WITH GFR
ALT: 18 U/L (ref 0–35)
AST: 16 U/L (ref 0–37)
Albumin: 4.2 g/dL (ref 3.5–5.2)
Alkaline Phosphatase: 80 U/L (ref 39–117)
BUN: 14 mg/dL (ref 6–23)
CO2: 29 meq/L (ref 19–32)
Calcium: 9.3 mg/dL (ref 8.4–10.5)
Chloride: 102 meq/L (ref 96–112)
Creatinine, Ser: 0.72 mg/dL (ref 0.40–1.20)
GFR: 80.03 mL/min (ref >60.00)
Glucose, Bld: 103 mg/dL — ABNORMAL HIGH (ref 70–99)
Potassium: 4.7 meq/L (ref 3.5–5.1)
Sodium: 138 meq/L (ref 135–145)
Total Bilirubin: 0.7 mg/dL (ref 0.2–1.2)
Total Protein: 7.2 g/dL (ref 6.0–8.3)

## 2024-08-06 LAB — HEMOGLOBIN A1C: Hgb A1c MFr Bld: 5.8 % (ref 4.6–6.5)

## 2024-08-06 LAB — LIPID PANEL
Cholesterol: 283 mg/dL — ABNORMAL HIGH (ref 0–200)
HDL: 75.7 mg/dL (ref >39.00)
LDL Cholesterol: 183 mg/dL — ABNORMAL HIGH (ref 0–99)
NonHDL: 207.52
Total CHOL/HDL Ratio: 4
Triglycerides: 125 mg/dL (ref 0.0–149.0)
VLDL: 25 mg/dL (ref 0.0–40.0)

## 2024-08-06 LAB — TSH: TSH: 1.49 u[IU]/mL (ref 0.35–5.50)

## 2024-08-06 LAB — VITAMIN D 25 HYDROXY (VIT D DEFICIENCY, FRACTURES): VITD: 32.47 ng/mL (ref 30.00–100.00)

## 2024-08-06 NOTE — Patient Instructions (Addendum)
 Flonase /Fluticasone  2 sprays each nostril at bedtime  All [facility] pharmacies are all walk in vaccine clinics M-F 9-4  Shingrix is the new shingles shot, 2 shots over 2-6 months, confirm coverage with insurance and document, then can return here for shots with nurse appt or at pharmacy   Annual flu and COVID shots  Respiratory Syncitial Virus Vaccine, called Arexvy vaccine  Prevnar 20   Tetanus booster also at pharmacy  Dizziness Dizziness is a common problem. It makes you feel unsteady or light-headed. You may feel like you're about to faint. Dizziness can lead to getting hurt if you stumble or fall. It's more common to feel dizzy if you're an older adult. Many things can cause you to feel dizzy. These include: Medicines. Dehydration. This is when there's not enough water in your body. Illness. Follow these instructions at home: Eating and drinking  Drink enough fluid to keep your pee (urine) pale yellow. This helps keep you from getting dehydrated. Try to drink more clear fluids, such as water. Do not drink alcohol. Try to limit how much caffeine you take in. Try to limit how much salt, also called sodium, you take in. Activity Try not to make quick movements. Stand up slowly from sitting in a chair. Steady yourself until you feel okay. In the morning, first sit up on the side of the bed. When you feel okay, hold onto something and slowly stand up. Do this until you know that your balance is okay. If you need to stand in one place for a long time, move your legs often. Tighten and relax the muscles in your legs while you're standing. Do not drive or use machines if you feel dizzy. Avoid bending down if you feel dizzy. Place items in your home so you can reach them without leaning over. Lifestyle Do not smoke, vape, or use products with nicotine or tobacco in them. If you need help quitting, talk with your health care provider. Try to lower your stress level. You can do this by  using methods like yoga or meditation. Talk with your provider if you need help. General instructions Watch your dizziness for any changes. Take your medicines only as told by your provider. Talk with your provider if you think you're dizzy because of a medicine you're taking. Tell a friend or a family member that you're feeling dizzy. If they spot any changes in your behavior, have them call your provider. Contact a health care provider if: Your dizziness doesn't go away, or you have new symptoms. Your dizziness gets worse. You feel like you may vomit. You have trouble hearing. You have a fever. You have neck pain or a stiff neck. You fall or get hurt. Get help right away if: You vomit each time you eat or drink. You have watery poop and can't eat or drink. You have trouble talking, walking, swallowing, or using your arms, hands, or legs. You feel very weak. You're bleeding. You're not thinking clearly, or you have trouble forming sentences. A friend or family member may spot this. Your vision changes, or you get a very bad headache. These symptoms may be an emergency. Call 911 right away. Do not wait to see if the symptoms will go away. Do not drive yourself to the hospital. This information is not intended to replace advice given to you by your health care provider. Make sure you discuss any questions you have with your health care provider. Document Revised: 05/12/2023 Document Reviewed: 09/23/2022 Elsevier Patient  Education  2024 [employer].

## 2024-08-07 ENCOUNTER — Ambulatory Visit: Payer: Self-pay | Admitting: Family Medicine

## 2024-08-07 DIAGNOSIS — E782 Mixed hyperlipidemia: Secondary | ICD-10-CM

## 2024-08-07 MED ORDER — ROSUVASTATIN CALCIUM 5 MG PO TABS: 5.0000 mg | ORAL_TABLET | ORAL | 1 refills | Status: AC

## 2024-08-07 NOTE — Telephone Encounter (Signed)
 Copied from CRM #[id number]. Topic: Clinical - Medication Question >> Aug 07, 2024  3:12 PM Renee [name] wrote: Reason for CRM: Patient was calling [name] back. I read over the PCP lab results verbatim and patient had no additional questions at this time. Patient wanted to let PCP know that she is okay with taking the Rosuvastatin  5 MG and would like it to be called into the following pharmacy:  [id number] - [place], [place] - [address] [place] [address] Phone: [phone] Fax: [phone] Hours: Not open 24 hours  Thank you so much!

## 2024-08-07 NOTE — Progress Notes (Signed)
Medication was send into pharmacy. 

## 2024-08-07 NOTE — Progress Notes (Signed)
 Called pt and no answer left message to return call.

## 2024-08-13 ENCOUNTER — Encounter (INDEPENDENT_AMBULATORY_CARE_PROVIDER_SITE_OTHER): Payer: Self-pay

## 2024-09-11 ENCOUNTER — Institutional Professional Consult (permissible substitution) (INDEPENDENT_AMBULATORY_CARE_PROVIDER_SITE_OTHER): Admitting: Physician Assistant

## 2025-03-07 ENCOUNTER — Encounter: Admitting: Family Medicine

## 2025-03-14 ENCOUNTER — Encounter: Admitting: Family Medicine

## 2025-03-15 ENCOUNTER — Ambulatory Visit
